# Patient Record
Sex: Female | Born: 1940 | Race: White | Hispanic: No | Smoking: Former smoker
Health system: Southern US, Community
[De-identification: ages and names within clinical notes are randomized; demographics above are authoritative.]

## PROBLEM LIST (undated history)

## (undated) DIAGNOSIS — M179 Osteoarthritis of knee, unspecified: Secondary | ICD-10-CM

## (undated) DIAGNOSIS — I472 Ventricular tachycardia, unspecified: Secondary | ICD-10-CM

## (undated) DIAGNOSIS — E119 Type 2 diabetes mellitus without complications: Secondary | ICD-10-CM

## (undated) DIAGNOSIS — I639 Cerebral infarction, unspecified: Secondary | ICD-10-CM

## (undated) DIAGNOSIS — F0390 Unspecified dementia without behavioral disturbance: Secondary | ICD-10-CM

## (undated) DIAGNOSIS — I509 Heart failure, unspecified: Secondary | ICD-10-CM

## (undated) DIAGNOSIS — E538 Deficiency of other specified B group vitamins: Secondary | ICD-10-CM

## (undated) DIAGNOSIS — R471 Dysarthria and anarthria: Secondary | ICD-10-CM

## (undated) DIAGNOSIS — M1710 Unilateral primary osteoarthritis, unspecified knee: Secondary | ICD-10-CM

## (undated) DIAGNOSIS — E669 Obesity, unspecified: Secondary | ICD-10-CM

## (undated) HISTORY — DX: Heart failure, unspecified: I50.9

## (undated) HISTORY — DX: Ventricular tachycardia, unspecified: I47.20

## (undated) HISTORY — DX: Type 2 diabetes mellitus without complications: E11.9

## (undated) HISTORY — DX: Dysarthria and anarthria: R47.1

## (undated) HISTORY — DX: Cerebral infarction, unspecified: I63.9

## (undated) HISTORY — DX: Obesity, unspecified: E66.9

## (undated) HISTORY — DX: Ventricular tachycardia: I47.2

---

## 2002-06-10 ENCOUNTER — Encounter: Payer: Self-pay | Admitting: *Deleted

## 2002-06-10 ENCOUNTER — Inpatient Hospital Stay (HOSPITAL_COMMUNITY): Admission: EM | Admit: 2002-06-10 | Discharge: 2002-06-17 | Payer: Self-pay | Admitting: *Deleted

## 2002-06-14 ENCOUNTER — Encounter: Payer: Self-pay | Admitting: *Deleted

## 2004-10-02 ENCOUNTER — Ambulatory Visit: Payer: Self-pay | Admitting: Internal Medicine

## 2006-02-23 ENCOUNTER — Ambulatory Visit: Payer: Self-pay | Admitting: Family Medicine

## 2006-05-29 ENCOUNTER — Ambulatory Visit: Payer: Self-pay | Admitting: Internal Medicine

## 2006-07-14 ENCOUNTER — Ambulatory Visit: Payer: Self-pay | Admitting: Gastroenterology

## 2007-06-23 ENCOUNTER — Ambulatory Visit: Payer: Self-pay | Admitting: Internal Medicine

## 2008-07-04 ENCOUNTER — Ambulatory Visit: Payer: Self-pay | Admitting: Internal Medicine

## 2008-07-17 ENCOUNTER — Ambulatory Visit: Payer: Self-pay | Admitting: Internal Medicine

## 2008-07-17 ENCOUNTER — Ambulatory Visit: Payer: Self-pay

## 2008-09-01 HISTORY — PX: BREAST BIOPSY: SHX20

## 2008-10-04 ENCOUNTER — Ambulatory Visit: Payer: Self-pay | Admitting: Family Medicine

## 2008-10-17 ENCOUNTER — Ambulatory Visit: Payer: Self-pay | Admitting: Family Medicine

## 2009-05-29 ENCOUNTER — Ambulatory Visit: Payer: Self-pay | Admitting: General Surgery

## 2009-09-05 DIAGNOSIS — I4729 Other ventricular tachycardia: Secondary | ICD-10-CM

## 2009-09-05 DIAGNOSIS — I472 Ventricular tachycardia, unspecified: Secondary | ICD-10-CM

## 2009-09-05 DIAGNOSIS — E669 Obesity, unspecified: Secondary | ICD-10-CM | POA: Insufficient documentation

## 2009-09-05 HISTORY — DX: Other ventricular tachycardia: I47.29

## 2009-09-05 HISTORY — DX: Ventricular tachycardia, unspecified: I47.20

## 2009-09-05 HISTORY — DX: Ventricular tachycardia: I47.2

## 2009-09-06 ENCOUNTER — Ambulatory Visit: Payer: Self-pay | Admitting: Internal Medicine

## 2009-10-05 ENCOUNTER — Ambulatory Visit: Payer: Self-pay | Admitting: Internal Medicine

## 2010-04-01 ENCOUNTER — Ambulatory Visit: Payer: Self-pay | Admitting: Internal Medicine

## 2010-10-03 NOTE — Letter (Signed)
Summary: Generic Letter  Architectural technologist, Main Office  [address]   [place], [name] [address]   Phone: [phone]  Fax: [phone]        April 01, 2010 MRN: [id number]    Yvonne Newton [address] [place], [name]  [address]    To Whom It May Concern,   [name] is okay to exercise per Dr Ladona Ridgel.  With further questions call our office at [phone].    Sincerely,    Dr Dorathy Daft, Ovidio Hanger, RN, BSN  This letter has been electronically signed by your physician.

## 2010-10-03 NOTE — Assessment & Plan Note (Signed)
Summary: F2Y  Medications Added METOPROLOL SUCCINATE 50 MG XR24H-TAB (METOPROLOL SUCCINATE) Take 2 tablets twice daily METFORMIN HCL 500 MG TABS (METFORMIN HCL) Take one tablet by mouth once daily. GLIPIZIDE 2.5 MG XR24H-TAB (GLIPIZIDE) Take one tablet by mouth once daily. TRAMADOL HCL 50 MG TABS (TRAMADOL HCL) as needed FLECAINIDE ACETATE 50 MG TABS (FLECAINIDE ACETATE) one by mouth two times a day      Allergies Added: ! SULFA ! * NICKEL  Visit Type:  Follow-up Primary Provider:  Burnett Sheng, MD   History of Present Illness: Ms. Marcy returns today after a year followup.  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  Over the last year, her episodes of palpitations, shortness of breath, and chest pressure have gradually worsened.  She notes that when she does anything strenuous, she will feel palpitations and she will get pressure in her chest.  This all gets better with rest.  The episodes have become more severe and lasting longer.  She has had no frank syncope.  She had no other specific complaints today.  She denies non-compliance with her medical therapy.  Current Medications (verified): 1)  Metoprolol Succinate 50 Mg Xr24h-Tab (Metoprolol Succinate) .... Take 3 Tablets By Mouth Daily 2)  Metformin Hcl 500 Mg Tabs (Metformin Hcl) .... Take One Tablet By Mouth Once Daily. 3)  Glipizide 2.5 Mg Xr24h-Tab (Glipizide) .... Take One Tablet By Mouth Once Daily. 4)  Verapamil Hcl Cr 180 Mg Xr24h-Cap (Verapamil Hcl) .Marland Kitchen.. 1 Capsule By Mouth Once Daily 5)  Tramadol Hcl 50 Mg Tabs (Tramadol Hcl) .... As Needed  Allergies (verified): 1)  ! Sulfa 2)  ! * Nickel  Past History:  Past Medical History: Last updated: 09/05/2009 Current Problems:  OBESITY (ICD-278.00) DM (ICD-250.00) VENTRICULAR TACHYCARDIA (ICD-427.1)    Review of Systems       The patient complains of chest pain and dyspnea  on exertion.  The patient denies syncope and peripheral edema.    Vital Signs:  Patient profile:   70 year old female Height:      60 inches Weight:      195 pounds BMI:     38.22 Pulse rate:   64 / minute BP sitting:   132 / 80  (left arm)  Vitals Entered By: Laurance Flatten CMA (September 06, 2009 9:10 AM)  Physical Exam  General:  Well developed, well nourished middle aged woman, in no acute distress.  HEENT: normal Neck: supple. No JVD. Carotids 2+ bilaterally no bruits Cor: RRR no rubs, gallops or murmur Lungs: CTA Ab: soft, nontender. nondistended. No HSM. Good bowel sounds Ext: warm. no cyanosis, clubbing or edema Neuro: alert and oriented. Grossly nonfocal. affect pleasant    EKG  Procedure date:  09/06/2009  Findings:      Normal sinus rhythm with rate of:  64.  Impression & Recommendations:  Problem # 1:  VENTRICULAR TACHYCARDIA (ICD-427.1) I have discussed the treatment options with the patient.  I am concerned that attempted ablation will be made more difficult by her likely like of VT at rest and with sedation.  For this reason and because she appears to be breaking thru on a combination of beta and calcium blockers, I have asked her to stop the Verapamil and start flecainide and to increase her metoprolol to 100 mg twice daily. Her updated medication list for this problem includes:    Metoprolol Succinate 50 Mg Xr24h-tab (  Metoprolol succinate) .Marland Kitchen... Take 2 tablets twice daily    Flecainide Acetate 50 Mg Tabs (Flecainide acetate) ..... One by mouth two times a day  Orders: EKG w/ Interpretation (93000)  Problem # 2:  OBESITY (ICD-278.00) A low fat diet is requested.  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 Her updated medication list for this problem includes:    Metoprolol Succinate 50 Mg Xr24h-tab (Metoprolol succinate) .Marland Kitchen... Take 2 tablets twice daily  Patient  Instructions: 1)  Your physician recommends that you schedule a follow-up appointment in: 3-4 weeks with Dr Ladona Ridgel 2)  Your physician has recommended you make the following change in your medication: stop Verapamil, increase Metoprolol 50mg  two times a day, start Flecainide 50mg  two times a day 3)  No strenuous activity Prescriptions: FLECAINIDE ACETATE 50 MG TABS (FLECAINIDE ACETATE) one by mouth two times a day  #60 x 6   Entered by:   Dennis Bast, RN, BSN   Authorized by:   Laren Boom, MD, [facility]   Signed by:   Dennis Bast, RN, BSN on 09/06/2009   Method used:   [facility]        [facility]  [address]       [address]       [place], [name]  [address]       Ph: [phone] or [phone]       Fax: [phone]   RxID:   [id number]

## 2010-10-03 NOTE — Assessment & Plan Note (Signed)
Summary: 6 MO F/U  Medications Added METOPROLOL SUCCINATE 100 MG XR24H-TAB (METOPROLOL SUCCINATE) one q am and 1/2 q pm for 2 months then 50mg  two times a day(1/2 two times a day0        Visit Type:  Follow-up Primary Provider:  Burnett Sheng, MD   History of Present Illness: Yvonne Newton returns today for followup.  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  Over the last year, her episodes of palpitations, shortness of breath, and chest pressure have gradually worsened.  When I saw her 6 months ago, I started flecainide and stopped verapamil and she has done well.  She has had no palpitations.  She has had no frank syncope.  She had no other specific complaints today.  She denies non-compliance with her medical therapy. She does admit to indiscretion with sodium. She notes some fatigue on her beta blocker and wonders if it may be decreased.  Current Medications (verified): 1)  Metoprolol Succinate 100 Mg Xr24h-Tab (Metoprolol Succinate) .... Two Times A Day 2)  Metformin Hcl 1000 Mg Tabs (Metformin Hcl) .... Take One Tablet Once Daily 3)  Glipizide 2.5 Mg Xr24h-Tab (Glipizide) .... Take One Tablet By Mouth Once Daily. 4)  Tramadol Hcl 50 Mg Tabs (Tramadol Hcl) .... As Needed 5)  Flecainide Acetate 50 Mg Tabs (Flecainide Acetate) .... One By Mouth Two Times A Day  Allergies: 1)  ! Sulfa 2)  ! * Nickel  Past History:  Past Medical History: Last updated: 2009-09-12 Current Problems:  OBESITY (ICD-278.00) DM (ICD-250.00) VENTRICULAR TACHYCARDIA (ICD-427.1)    Family History: Last updated: Sep 12, 2009  Her father died at the age of 58 of a myocardial  infarction. One brother had myocardial infarction at age 36. Another brother  also had an myocardial infarction and underwent coronary artery bypass graft  surgery.  Social History: Last updated: 09/12/09 Tobacco Use - Former.    Review of Systems  The patient denies chest pain, syncope, dyspnea on exertion, and peripheral edema.    Vital Signs:  Patient profile:   70 year old female Height:      60 inches Weight:      192 pounds BMI:     37.63 Pulse rate:   63 / minute BP sitting:   162 / 82  (left arm)  Vitals Entered By: Yvonne Newton CMA (April 01, 2010 3:31 PM)  Physical Exam  General:  Well developed, well nourished middle aged woman, in no acute distress.  HEENT: normal Neck: supple. No JVD. Carotids 2+ bilaterally no bruits Cor: RRR no rubs, gallops or murmur Lungs: CTA Ab: soft, nontender. nondistended. No HSM. Good bowel sounds Ext: warm. no cyanosis, clubbing or edema Neuro: alert and oriented. Grossly nonfocal. affect pleasant    EKG  Procedure date:  04/01/2010  Findings:      Normal sinus rhythm with rate of:63.    Impression & Recommendations:  Problem # 1:  VENTRICULAR TACHYCARDIA (ICD-427.1) Her symptoms have been very nicely controlled on high dose metoprolol and low dose flecainide.  Will ask her to reduce her metoprolol as below and continue her flecainide.  She will call if her symptoms worsen. Her updated medication list for this problem includes:    Metoprolol Succinate 100 Mg Xr24h-tab (Metoprolol succinate) ..... One q am and 1/2 q pm for 2 months then 50mg  two times a day(1/2 two times a day0    Flecainide Acetate  50 Mg Tabs (Flecainide acetate) ..... One by mouth two times a day  Problem # 2:  ESSENTIAL HYPERTENSION, BENIGN (ICD-401.1) Her blood pressure is elevated.  I will ask her to reduce her sodium intake and will follow up.  She may require additional medication now that we are reducing her beta blocker. Her updated medication list for this problem includes:    Metoprolol Succinate 100 Mg Xr24h-tab (Metoprolol succinate) ..... One q am and 1/2 q pm for 2 months then 50mg  two times a day(1/2 two times a day0  Patient Instructions: 1)  Your physician  recommends that you schedule a follow-up appointment in: 6 months with Dr Ladona Ridgel 2)  Your physician has recommended you make the following change in your medication: decrease Metoprolol to 100mg  in the am and 50mg  in the pm for 2 months then decrease to 50mg  two times a day

## 2010-10-03 NOTE — Assessment & Plan Note (Signed)
Summary: PER CHECK OUT/SF  Medications Added METOPROLOL SUCCINATE 100 MG XR24H-TAB (METOPROLOL SUCCINATE) two times a day METFORMIN HCL 1000 MG TABS (METFORMIN HCL) take one tablet once daily      Allergies Added:   Visit Type:  Follow-up Primary Provider:  Burnett Sheng, MD  CC:  follow up.  History of Present Illness: Yvonne Newton returns today after a year followup.  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  Over the last year, her episodes of palpitations, shortness of breath, and chest pressure have gradually worsened.  When I saw her a month ago, I started flecainide and stopped verapamil and she has done well.  She has had no palpitations.  She has had no frank syncope.  She had no other specific complaints today.  She denies non-compliance with her medical therapy. She does admit to indiscretion with sodium.  Current Medications (verified): 1)  Metoprolol Tartrate 50 Mg Tabs (Metoprolol Tartrate) .... Take 2 Tablets in The Morning and 2 Tablets in The Evening 2)  Metformin Hcl 1000 Mg Tabs (Metformin Hcl) .... Take One Tablet Once Daily 3)  Glipizide 2.5 Mg Xr24h-Tab (Glipizide) .... Take One Tablet By Mouth Once Daily. 4)  Tramadol Hcl 50 Mg Tabs (Tramadol Hcl) .... As Needed 5)  Flecainide Acetate 50 Mg Tabs (Flecainide Acetate) .... One By Mouth Two Times A Day  Allergies (verified): 1)  ! Sulfa 2)  ! * Nickel  Past History:  Past Medical History: Last updated: 09/05/2009 Current Problems:  OBESITY (ICD-278.00) DM (ICD-250.00) VENTRICULAR TACHYCARDIA (ICD-427.1)    Review of Systems  The patient denies chest pain, syncope, dyspnea on exertion, and peripheral edema.    Vital Signs:  Patient profile:   70 year old female Height:      60 inches Weight:      197 pounds BMI:     38.61 Pulse rate:   55 / minute Pulse rhythm:   regular BP sitting:   146 / 70  (left  arm) Cuff size:   large  Vitals Entered By: Judithe Modest CMA (October 05, 2009 8:42 AM)  Physical Exam  General:  Well developed, well nourished middle aged woman, in no acute distress.  HEENT: normal Neck: supple. No JVD. Carotids 2+ bilaterally no bruits Cor: RRR no rubs, gallops or murmur Lungs: CTA Ab: soft, nontender. nondistended. No HSM. Good bowel sounds Ext: warm. no cyanosis, clubbing or edema Neuro: alert and oriented. Grossly nonfocal. affect pleasant    EKG  Procedure date:  10/05/2009  Findings:      Sinus bradycardia with rate of:  55. ILBBB.  Impression & Recommendations:  Problem # 1:  VENTRICULAR TACHYCARDIA (ICD-427.1) Her RVOT VT is well controlled on her current medical therapy.  Today I will switch her to lopressor and continue flecanide. Her updated medication list for this problem includes:    Metoprolol Succinate 100 Mg Xr24h-tab (Metoprolol succinate) .Marland Kitchen..Marland Kitchen Two times a day    Flecainide Acetate 50 Mg Tabs (Flecainide acetate) ..... One by mouth two times a day  Problem # 2:  ESSENTIAL HYPERTENSION, BENIGN (ICD-401.1) Continue low sodium diet and current medical therapy. I have recommended low intensity exercise. Her updated medication list for this problem includes:    Metoprolol Succinate 100 Mg Xr24h-tab (Metoprolol succinate) .Marland Kitchen..Marland Kitchen Two times a day  Patient Instructions: 1)  Your physician has recommended you make the following change in your medication: STOP THE  METOPROLOL TARTRATE. 2)  START METOPROLOL SUCCINATE AT 100MG  two times a day  3)  Your physician recommends that you schedule a follow-up appointment in: 6 MONTHS Prescriptions: METOPROLOL SUCCINATE 100 MG XR24H-TAB (METOPROLOL SUCCINATE) two times a day  #60 x 6   Entered by:   Duncan Dull, RN, BSN   Authorized by:   Laren Boom, MD, [facility]   Signed by:   Duncan Dull, RN, BSN on 10/05/2009   Method used:   [facility]        [facility]  [address]  [address] ([facility]       [address]       [place], [name]  [address]       Ph: [phone] or [phone]       Fax: [phone]   RxID:   [id number]

## 2010-10-30 ENCOUNTER — Encounter: Payer: Self-pay | Admitting: Internal Medicine

## 2010-10-30 ENCOUNTER — Ambulatory Visit (INDEPENDENT_AMBULATORY_CARE_PROVIDER_SITE_OTHER): Payer: Medicare Other | Admitting: Internal Medicine

## 2010-10-30 DIAGNOSIS — I10 Essential (primary) hypertension: Secondary | ICD-10-CM

## 2010-10-30 DIAGNOSIS — I472 Ventricular tachycardia: Secondary | ICD-10-CM

## 2010-11-07 NOTE — Assessment & Plan Note (Signed)
Summary: follow up/klper check out/sf  Medications Added METOPROLOL SUCCINATE 100 MG XR24H-TAB (METOPROLOL SUCCINATE) one q am and 1/2 q pm for 2 months then 50mg  two times a day(1/2 two times a day0 METOPROLOL TARTRATE 50 MG TABS (METOPROLOL TARTRATE) 1 two times a day * VIT D2 M 50,000 U 1 once daily        Visit Type:  Follow-up Primary Provider:  Burnett Sheng, MD   History of Present Illness: Yvonne Newton returns today for followup.  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  Over the last year, her episodes of palpitations, shortness of breath, and chest pressure had gradually worsened.  When I saw her 12 months ago, I started flecainide and stopped verapamil and she has done well.  She has had no palpitations.  She has had no frank syncope.  She had no other specific complaints today.  She denies non-compliance with her medical therapy. She does admit to indiscretion with sodium. She notes some fatigue on her beta blocker and wonders if it may be decreased.  Current Medications (verified): 1)  Metoprolol Succinate 100 Mg Xr24h-Tab (Metoprolol Succinate) .... One Q Am and 1/2 Q Pm For 2 Months Then 50mg  Two Times A Day(1/2 Two Times A Day0 2)  Metformin Hcl 1000 Mg Tabs (Metformin Hcl) .... Take One Tablet Once Daily 3)  Glipizide 2.5 Mg Xr24h-Tab (Glipizide) .... Take One Tablet By Mouth Once Daily. 4)  Tramadol Hcl 50 Mg Tabs (Tramadol Hcl) .... As Needed 5)  Flecainide Acetate 50 Mg Tabs (Flecainide Acetate) .... One By Mouth Two Times A Day 6)  Vit D2 M 50,000 U .... 1 Once Daily  Allergies: 1)  ! Sulfa 2)  ! * Nickel  Past History:  Past Medical History: Last updated: 09/05/2009 Current Problems:  OBESITY (ICD-278.00) DM (ICD-250.00) VENTRICULAR TACHYCARDIA (ICD-427.1)    Vital Signs:  Patient profile:   70 year old female Height:      60 inches Weight:      195  pounds BMI:     38.22 Pulse rate:   73 / minute BP sitting:   132 / 68  (left arm)  Vitals Entered By: Laurance Flatten CMA (October 30, 2010 10:19 AM)  Physical Exam  General:  Well developed, well nourished middle aged woman, in no acute distress.  HEENT: normal Neck: supple. No JVD. Carotids 2+ bilaterally no bruits Cor: RRR no rubs, gallops or murmur Lungs: CTA Ab: soft, nontender. nondistended. No HSM. Good bowel sounds Ext: warm. no cyanosis, clubbing or edema Neuro: alert and oriented. Grossly nonfocal. affect pleasant    EKG  Procedure date:  10/30/2010  Findings:      Normal sinus rhythm with rate of:  73.  Impression & Recommendations:  Problem # 1:  VENTRICULAR TACHYCARDIA (ICD-427.1) Her symptoms are much improved. She will continue her current meds but I have asked that she reduce her metoprolol. Her updated medication list for this problem includes:    Metoprolol Tartrate 50 Mg Tabs (Metoprolol tartrate) .Marland Kitchen... 1 two times a day    Flecainide Acetate 50 Mg Tabs (Flecainide acetate) ..... One by mouth two times a day  Orders: EKG w/ Interpretation (93000)  Problem # 2:  ESSENTIAL HYPERTENSION, BENIGN (ICD-401.1) Her blood pressure is well controlled today. I have asked her to reduce her sodium intake. Her updated medication list for this problem includes:    Metoprolol Tartrate  50 Mg Tabs (Metoprolol tartrate) .Marland Kitchen... 1 two times a day  Patient Instructions: 1)  Your physician wants you to follow-up in:6 MONTHS WITH DR Court Joy will receive a reminder letter in the mail two months in advance. If you don't receive a letter, please call our office to schedule the follow-up appointment. 2)  Your physician has recommended you make the following change in your medication: METOPROLOL TART 50 MG 1/2 TAB once daily Prescriptions: METOPROLOL TARTRATE 50 MG TABS (METOPROLOL TARTRATE) 1 two times a day  #60 x 6   Entered by:   Scherrie Bateman, LPN   Authorized by:    Laren Boom, MD, [facility]   Signed by:   Scherrie Bateman, LPN on 14/78/2956   Method used:   [facility]        [facility]  [address]       [address]       [place], [name]  [address]       Ph: [phone] or [phone]       Fax: [phone]   RxID:   [id number]

## 2010-11-11 ENCOUNTER — Telehealth: Payer: Self-pay | Admitting: Internal Medicine

## 2010-11-19 NOTE — Progress Notes (Signed)
Summary: c/o heart racing/lm   Phone Note Call from Patient Call back at Home Phone [phone]   Refills Requested: Medication #1:   1 two times a day Caller: Patient Reason for Call: Talk to Nurse Summary of Call: c/o heart racing pt went back on her original meds of METOPROLOL TARTRATE 50 MG TABS. vs 25 mg in am 25 mg in pm.  Initial call taken by: Lorne Skeens,  November 11, 2010 9:35 AM  Follow-up for Phone Call        called pt back and spoke with husband.  She was not at home.  i asked him to have her call me back. Dennis Bast, RN, BSN  November 11, 2010 1:13 PM pt increased her Metoprolol back to 50mg  two times a day on her own due to palpitations  She feels better now Dennis Bast, RN, BSN  November 12, 2010 1:23 PM

## 2010-12-08 ENCOUNTER — Other Ambulatory Visit: Payer: Self-pay | Admitting: Internal Medicine

## 2011-01-14 NOTE — Procedures (Signed)
[facility]                              EXERCISE TREADMILL   NAME:Munnerlyn, [name]                   MRN:          [id number]  DATE:07/17/2008                            DOB:          09-Apr-1941    The treadmill test is being performed today to evaluate for exercise-  induced arrhythmias in a patient with a prior history of RV outflow  tract ventricular tachycardia.   I. INTRODUCTION:  The patient is a very pleasant, middle-aged woman, who  has a documented history of RV outflow tract VT in the past.  She had  previously been stable on medical therapy.  However, over the last  several months, she has had increasingly frequent episodes of tachy-  palpitations typically occurring with exercise.  These are associated  with dizziness and lightheadedness.  She has not had any frank syncope.  She is here today for exercise treadmill test to see about the  inducibility of her RV outflow tract VT.  Of note, the patient did not  take her Toprol today on my request.   II. INTERPRETATION OF PROCEDURE:  After informed consent obtained, the  patient was prepped in the usual manner, she was placed on the exercise  treadmill where she walked utilizing the modified Bruce protocol.  The  patient completed stage 2 on the Bruce protocol walking for a total of 7  minutes.  Her peak heart rate increased to 112 beats per minute.  Her  blood pressure response was hypertensive, initially starting in the 160s  and going up to over 200 mm systolic.  The test was stopped because the  patient became dizzy and lightheaded.  She denied chest pain, she did  not have shortness of breath, she did not have much in the way of  palpitations.  She was allowed to recover in the usual manner.  Her  blood pressure did remain elevated, however.  Her heart rate came down  into the 80 range.  The test was clinically negative, the test was  electrically negative, there were no diagnostic  STT depression for  diagnostic of ischemia.   III. COMPLICATIONS:  There were no immediate procedure complications.   IV. RESULTS:  This exercise treadmill test demonstrates no inducible  ischemia and demonstrates no exercise induced arrhythmias.  There was a  hypertensive response to exercise.  The patient is very clearly  deconditioned.     Doylene Canning. Ladona Ridgel, MD  Electronically Signed    GWT/MedQ  DD: 07/17/2008  DT: 07/17/2008  Job #: [id number]   cc:   Jerl Mina

## 2011-01-14 NOTE — Assessment & Plan Note (Signed)
[facility]                         ELECTROPHYSIOLOGY OFFICE NOTE   NAME:Yvonne Newton                   MRN:          [id number]  DATE:07/04/2008                            DOB:          October 03, 1940    Yvonne Newton returns today after a year followup.  She is a very  pleasant 70 year old woman with a nonsustained VT originating from the  RV outflow tract, hypertension, diabetes, and obesity.  Over the last  year, her episodes of palpitations, shortness of breath, and chest  pressure have gradually worsened, particularly in the last several  weeks.  She notes that when she does anything strenuous, she will feel  palpitations and she will get pressure in her chest.  This all gets  better with rest.  The episodes have become more severe and lasting  longer.  She has had no frank syncope.  She had no other specific  complaints today.  She states that her diabetes has been fairly well  controlled with hemoglobin A1c's under 7.   MEDICATIONS:  1. Metoprolol 150 a day.  2. Glipizide ER 2.5 a day.  3. Metformin 500 twice a day.  4. Amlodipine 5 a day.   PHYSICAL EXAMINATION:  GENERAL:  She is a pleasant middle-aged woman in  no acute distress.  VITAL SIGNS:  Her blood pressure was 128/70, the pulse 68 and regular,  respirations were 18, and the weight was 195 pounds. NECK:  No jugular  venous distention.  LUNGS:  Clear bilaterally to auscultation.  No wheezes, rales, or  rhonchi are present.  CARDIOVASCULAR:  Regular rate and rhythm.  Normal S1 and S2.  There are  no murmurs, rubs, or gallops.  ABDOMEN:  Soft, nontender, and nondistended.  EXTREMITIES:  Trace peripheral edema bilaterally.   Her EKG demonstrates sinus rhythm with normal axis and intervals.   IMPRESSION:  1. Worsening chest pain, palpitations, and shortness of breath.  2. 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  3.  Diabetes.  4. Obesity.   DISCUSSION:  Yvonne Newton symptoms have worsened in the last year,  particularly in the last several weeks.  I am concerned that she is  having more VT, but because of her coronary artery disease risk factor,  this all may be angina.  I have recommended we first start out by  undergoing exercise treadmill test, as the patient does have a fairly  normal EKG to see if we can have any exercise-induced arrhythmias.  Pending the results of this, we will make additional recommendations.  At this point, because she has had symptomatic palpitations despite beta-  blocker therapy which appear to be worse, I would strongly consider an  EP study and  consideration for catheter ablation if, in fact, she has exercise-  induced VT at exercise treadmill testing.  We will also plan to obtain a  flu vaccine today, as she has not had one yet this year.     Doylene Canning. Ladona Ridgel, MD  Electronically Signed    GWT/MedQ  DD: 07/04/2008  DT: 07/04/2008  Job #: [id number]  cc:   Jerl Mina

## 2011-01-14 NOTE — Assessment & Plan Note (Signed)
[facility]                         ELECTROPHYSIOLOGY OFFICE NOTE   NAME:Schueler, Sandra Cockayne                   MRN:          [id number]  DATE:06/23/2007                            DOB:          [birth year]    Ms. Dullea returns today for followup.  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  She returns today  for followup.  In the last year, she has been stable.  She notes that  when she does anything strenuous, particularly with her arms, she will  have discomfort in the chest and palpitations, but otherwise she has  been very stable.  She has had no syncope.  She is tolerating her Toprol  very nicely.  She has lost a few pounds in the last year, going from 197  to 193.   PHYSICAL EXAMINATION:  She is a pleasant middle-age woman in no acute  distress.  Blood pressure was 142/70, the pulse was 66 and regular, the  respirations were 18, the weight was 193 pounds.  NECK:  Revealed no jugulovenous distention.  LUNGS:  Clear bilaterally to auscultation.  No wheezes, rales or rhonchi  were present.  CARDIOVASCULAR:  Exam revealed a regular rate and rhythm with normal S1  and S2.  There were no murmurs, rubs, or gallops.  EXTREMITIES:  Demonstrated no cyanosis, clubbing or edema.   EKG:  Demonstrates sinus rhythm with poor R-wave progression.   IMPRESSION:  1. Ventricular tachycardia (nonsustained, presenting from the right      ventricular outflow tract -- controlled with beta blockers.  2. Hypertension; this has improved with beta blockers.  3. Obesity.  I have discussed today the importance of reducing her      food intake and strategies that might help with that.  4. Diabetes.  Her blood sugars have been well-controlled.  Her      hemoglobin A1c was in the 6 range.   PLAN:  Plan will be to see the patient back in 2 years, as she has  done  so well in the last year or 2 and she is instructed to call us if her  palpitations worsen.     Doylene Canning. Ladona Ridgel, MD  Electronically Signed    GWT/MedQ  DD: 06/23/2007  DT: 06/24/2007  Job #: [id number]   cc:   Carrington Clamp MD

## 2011-01-17 NOTE — Op Note (Signed)
NAME:  Yvonne Newton, Yvonne Newton NO.:  [id number]   MEDICAL RECORD NO.:  [id number]                   PATIENT TYPE:  INP   LOCATION:  3711                                 FACILITY:  [facility]   PHYSICIAN:  Doylene Canning. Ladona Ridgel, M.D. [facility]           DATE OF BIRTH:  1940/10/06   DATE OF PROCEDURE:  06/16/2002  DATE OF DISCHARGE:                                 OPERATIVE REPORT   PROCEDURE:  Invasive electrophysiologic study.   INDICATIONS:  Sustained ventricular tachycardia.   INTRODUCTION:  The patient is a very pleasant 70 year old woman with a  history of hypertension.  She had tachypalpitations and underwent exercise  treadmill testing, resulting in the initiation of a left bundle branch  block, superior axis ventricular tachycardia.  This was associated with  dizziness but no syncope.  The patient's tachycardia terminated with rest.  The cycle length was 400 msec.  She was subsequently transferred from the  [facility] for additional evaluation.  She was noted to have  preserved LV systolic function.  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  However, a cardiac MRI scan was done demonstrating no evidence  of any fatty fibroinfiltration of the myocardium.  She is now referred for  invasive electrophysiologic study.   DESCRIPTION OF PROCEDURE:  After informed consent was obtained, the patient  was taken to the diagnostic EP lab in a fasted state.  After the usual  preparation and draping, intravenous fentanyl and midazolam were given for  sedation.  A 5 French quadripolar catheter was inserted percutaneously in  the right femoral vein and advanced to the right ventricular apex.  A 5  French quadripolar catheter was inserted percutaneously in the right femoral  vein and advanced to the His bundle region.  Programmed ventricular  stimulation was subsequently carried out from  the RV apex as well as the RV  outflow tract at basic drive cycle lengths of 629 and 400 msec.  S1-S2, S1-  S2-S3, and S1-S2-S3-S4 stimuli were delivered with the S1-S2, S2-S3, and S3-  S4 intervals stepwise decreased down to ventricular refractoriness.  During  programmed ventricular stimulation there was no inducible VT.  Rapid  ventricular pacing was then carried out from the RV apex, demonstrating a VA  Wenckebach cycle length of 550 msec.  Rapid atrial pacing was then carried  out, demonstrating an AV Wenckebach cycle length of 350 msec.  During rapid  atrial pacing the PR interval remained less than the RR interval, and there  was no inducible tachycardia.  Programmed atrial stimulation was then  carried out from the high right atrium at a basic drive cycle length of 528  msec.  The S1-S2 interval was stepwise decreased down to 200 msec, where AV  conduction persisted.  At this point the catheters were then removed,  hemostasis was assured, and the patient returned to  her room in satisfactory  condition.   COMPLICATIONS:  There were no immediate procedural complications.   RESULTS:  A.  BASELINE ELECTROCARDIOGRAM:  The baseline ECG demonstrates  normal sinus rhythm  with normal axis and intervals.   B.  BASELINE INTERVALS:  The sinus node cycle length was 993 msec.  The QRS  duration was 86 msec.  The HV interval was 40 msec.   C.  RAPID VENTRICULAR PACING:  Rapid ventricular pacing was carried out from  the RV apex at  pacing cycle length of 600 msec and stepwise decreased down  to 550 msec, where VA Wenckebach was observed.  During rapid ventricular  pacing, the pacing interval was stepwise decreased down to 260 msec, and  there was no inducible VT.   D.  PROGRAMMED VENTRICULAR STIMULATION:  Programmed ventricular stimulation  was carried out from the RV outflow tract as well as the RV apex at pacing  cycle lengths of 500 and 400 msec.  S1-S2, S1-S2-S3, and S1-S2-S3-S4  stimuli  were delivered with the S1-S2, S2-S3, and S3-S4 intervals stepwise decreased  down to ventricular refractoriness.  During programmed ventricular  stimulation there was no inducible VT.   CONCLUSION:  This study demonstrates no evidence of inducible VT or SVT in a  patient with previously exercise-induced VT.  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  As such, she will be treated with beta blocker  therapy and follow up.  If she has recurrent tachypalpitations or is unable  to tolerate beta blockers, then consideration will be made for catheter  ablation of her tachycardia.                                                Doylene Canning. Ladona Ridgel, M.D. [facility]    GWT/MEDQ  D:  06/16/2002  T:  06/17/2002  Job:  [id number]   cc:   Nicholes Rough, [name] Arnoldo Hooker, M.D.   [place], [name] Dr. Burnett Sheng, [facility]   Kathrine Cords, [facility]

## 2011-01-17 NOTE — Consult Note (Signed)
NAME:  Yvonne Newton, Yvonne Newton NO.:  [id number]   MEDICAL RECORD NO.:  [id number]                   PATIENT TYPE:  INP   LOCATION:  3711                                 FACILITY:  [facility]   PHYSICIAN:  Doylene Canning. Ladona Ridgel, M.D. [facility]           DATE OF BIRTH:  03/25/1941   DATE OF CONSULTATION:  06/13/2002  DATE OF DISCHARGE:                                   CONSULTATION   INDICATIONS FOR CONSULTATION:  Evaluation of a wide QRS tachycardia.   HISTORY OF PRESENT ILLNESS:  The patient is a very pleasant 70 year old  woman with a new onset diabetes mellitus diagnosed just one to two weeks ago  and a history of hypertension, again, very recently diagnosed. She has a  history of tobacco abuse and in the past has been a 30 pack year smoker. She  has had tachy palpitations in the past and underwent catheterization several  years ago, and at that time had normal left ventricular function and no  obstructive coronary disease. She continued to have tachy palpitations and  typically  while at work, occurring only rarely, associated with dizziness  but no frank syncope. Because of these palpitations, she underwent exercise  treadmill testing, where approximately 1 and a half minutes into exercise,  the patient developed a wide QRS tachycardia. The patient was  dizzy and  light headed but denied frank syncope. The tachycardia was a left bundle  branch block tachycardia with strongly positive inferior axis leads,  suggesting tachycardia origination from the RV outflow tract. Interestingly  there was early transition from negative to positive  in the precordial  leads between V2 and leads V3. The tachycardia terminated spontaneously. She  underwent urgent catheterization, demonstrating preserved left ventricular  systolic function and no obstructive coronary disease and is subsequently  transferred for additional evaluation and treatment. The patient has been on  telemetry  monitoring now for several days and has had no arrhythmias noted.   PAST MEDICAL HISTORY:  As previously noted.  There is also a history of  hyperlipidemia.   FAMILY HISTORY:  Notable for her father dying at age 34 of an MI. She had  two brothers who have had coronary disease.   SOCIAL HISTORY:  The patient is married and denies ethanol abuse and smokes  cigarettes as previously noted.   REVIEW OF SYSTEMS:  Negative for fever or chills and night sweats. She  denies any nasal discharge or vision or hearing problems. She denies any  skin rashes or lesions. She denies any chest pain or shortness of breath.  She does have some dyspnea on exertion. She denies any orthopnea or PND. She  has rare peripheral edema. She denies syncope or claudication or cough. She  denies dysuria, hematuria or nocturia. She denies weakness, numbness or mood  problems. She denies depression or anxiety. She denies arthralgias, joint  swelling or deformity. She denies any nausea or vomiting.  She has had  episodes of diarrhea in the past. She denies melena, hematemesis or reflux  symptoms. She denies polyuria or polydipsia.   PHYSICAL EXAMINATION:  GENERAL:  She is a pleasant, well appearing middle  aged woman in no distress.  VITAL SIGNS:  Blood pressure 120/80, pulse 64 and regular, respirations 20,  temperature 97.3. Weight 180 pounds.  HEENT:  Normocephalic, atraumatic. Pupils were equal and round. Oropharynx  was moist. Sclerae anicteric. No nasal discharge.  NECK:  Supple with no jugular venous  distention. No thyromegaly. The  carotids were 2+ and symmetric.  CARDIOVASCULAR:  Revealed a regular rate and rhythm with normal S1, S2.  There were no murmurs, rubs, gallops.  LUNGS:  Clear to auscultation bilaterally.  ABDOMEN:  Soft, nontender, nondistended.  EXTREMITIES:  Demonstrated no cyanosis, clubbing or edema.  NEUROLOGIC:  She was alert and oriented x 3 with cranial nerves II through  XII grossly  intact. The strength was 5/5 and symmetric.   LABORATORY DATA:  An EKG demonstrates a normal sinus rhythm with poor R-wave  transition in the anterior leads. There was no  obvious epsilon wave noted.  There were nonspecific T-wave abnormality noted.   IMPRESSION:  1. Wide QRS tachycardia originating in or around the RV outflow tract, most     likely.  2. Hypertension.  3. Diabetes.  4. History of dyslipidemia.   DISCUSSION:  I have discussed the likely diagnosis with the patient. She  most likely has RV outflow tract VT. She could certainly have an  arrhythmogenic RV dysplasia with VT originating from the RV outflow tract  region. Our plan will be to have the patient undergo signal averaged EKG and  cardiac MRI. If there is evidence to suggest that in fact she does have  arrhythmogenic RV dysplasia, then electrophysiologic study and ICD insertion  will be warranted. If it appears that the patient does not have  arrhythmogenic RV dysplasia, then the possibility of catheter ablation  versus initial trial of medical therapy will be decided upon. The risks and  benefits, goals and expectations of all of the above procedures have been  discussed with the patient. We will plan to proceed based on additional  data. Of note the fact that the transition from V2 to V3 with negative to  positive suggests that this tachycardia may be near the septum, which is  sometimes even more difficult to ablate.                                               Doylene Canning. Ladona Ridgel, M.D. [facility]    GWT/MEDQ  D:  06/13/2002  T:  06/14/2002  Job:  [id number]   cc:   Arnoldo Hooker, M.D.  [place], [name]   James Hedrick   Kathrine Cords, R.N. [facility]  [address]  [place], [name] [address]  Fax: 1

## 2011-01-17 NOTE — Discharge Summary (Signed)
NAME:  Yvonne Newton, Yvonne Newton NO.:  [id number]   MEDICAL RECORD NO.:  [id number]                   PATIENT TYPE:  INP   LOCATION:  3711                                 FACILITY:  [facility]   PHYSICIAN:  Doylene Canning. Ladona Ridgel, M.D. [facility]           DATE OF BIRTH:  01-12-1941   DATE OF ADMISSION:  06/10/2002  DATE OF DISCHARGE:  06/17/2002                                 DISCHARGE SUMMARY   PRIMARY DIAGNOSIS:  Ventricular tachycardia.   HISTORY OF PRESENT ILLNESS:  This is a 70 year old female with new onset  diabetes diagnosed just one to two weeks ago.  History of hypertension,  recently diagnosed.  Tobacco abuse in the past, has been a 30 pack year  smoker, tachy palpitations in the past.  Underwent catheterization several  years ago.  At that time, had normal left ventricular function and no  obstructive disease.  She continued to have tachy palpitations typical while  at work occurring, rarely associated with dizziness, but no frank syncope.  Because of palpitations, she underwent an exercise treadmill where  approximately 1-1/2 minutes into the exercise she developed a wide QRS  tachycardia.  The patient was dizzy, lightheaded, but denied frank syncope.  The tachycardia was a left bundle branch block tachycardia with strong  positive inferior axis leads suggesting tachycardia originating from the RV  outflow track.  Interestingly, there was early transition from negative to  positive in the precordial leads between V2 and V3.  The tachycardia  terminated spontaneously.  She underwent urgent catheterization which  demonstrated preserved left ventricular function and no obstructive coronary  disease.  She was transferred to [facility] for evaluation and  treatment by electrophysiology.   HOSPITAL COURSE:  The patient had an EP consult.  She underwent a signal  average EKG as well as a cardiac MRI.  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  The  cardiac MRI was negative, suggesting RVOTVT.  The  patient was then scheduled for an EP study.  The patient's EP study was  negative for inducible ventricular tachycardia, and she was discharged to  home on Toprol XL 50 mg p.o. q.d.   LABORATORY DATA:  On 06/16/02, INR was 0.9.  On 06/14/02, white blood cell  count was 10.3, hemoglobin and hematocrit 13.7 and 40.3, with a platelet  count of 232.  On 06/14/02, sodium was 140, potassium 4.1, chloride 104, CO2  29, glucose 109.  The patient's fasting glucoses were as follows:  124, 128,  133, and 109.  BUN and creatinine were 15 and 0.8.  Hemoglobin A1C was 7.5.  Cholesterol 154, HDL 38, LDL 89, triglycerides 136.  TSH was 1.419.  The  patient was given a referral for diabetic teaching at [facility] diabetic program for dietary teaching.   DISCHARGE MEDICATIONS:  Toprol XL 50 mg q.d.   ACTIVITY:  No heavy lifting or  strenuous activity for the next four days.  No driving for two days.   DIET:  Low fat, low cholesterol, low salt diet.   She was to call if she developed a lump or any drainage in her groin.    FOLLOWUP:  The patient was to see Dr. Ladona Ridgel on 07/10/02, at 11:45 a.m.   The patient was allowed to return to work on Monday.       Chinita Pester, C.R.N.P. [facility]                 Doylene Canning. Ladona Ridgel, M.D. [facility]    DS/MEDQ  D:  06/17/2002  T:  06/19/2002  Job:  [id number]   cc:   Laveda Norman, M.D.  [place], [name]   Mickel Baas, M.D.  [facility] [place], [name]   Kathrine Cords, R.N. [facility]  [address]  [place], [name] [address]  Fax: 1

## 2011-01-17 NOTE — H&P (Signed)
NAME:  Yvonne Newton, Yvonne Newton NO.:  [id number]   MEDICAL RECORD NO.:  [id number]                   PATIENT TYPE:  INP   LOCATION:  3711                                 FACILITY:  [facility]   PHYSICIAN:  Veneda Melter, M.D. [facility]               DATE OF BIRTH:  10-26-1940   DATE OF ADMISSION:  06/10/2002  DATE OF DISCHARGE:                                HISTORY & PHYSICAL   CHIEF COMPLAINT:  Palpitations and presyncope.   HISTORY OF PRESENT ILLNESS:  The patient is a 70 year old white female with  no prior cardiac history who presents for assessment and treatment of  ventricular tachycardia. The patient has had a several week history of  palpitations with associated presyncope. These have lasted a few minutes and  then associated with mild substernal chest pressure and shortness of breath.  The patient has undergone cardiac catheterization approximately two years  ago which was reportedly normal. She presented to [facility] on  several occasions and was referred for further assessment. She underwent a  stress imaging study during which she had ventricular tachycardia with  extension. Cardiac catheterization was performed today showing mild coronary  artery disease and well preserved left ventricular function. She is  referred for further assessment and treatment.   REVIEW OF SYSTEMS:  Currently the patient denies any chest pain or shortness  of breath. She has not had any further palpitations. She notes episodes of  presyncope with palpitations but no frank syncope, no diaphoresis, nausea or  vomiting, bright red blood per rectum, melena, hematuria or dysuria. No  fever or chills or other constitutional symptoms. Review of systems is  otherwise noncontributory.   PAST MEDICAL HISTORY:  1. Notable for normal cardiac catheterization approximately two years ago. A     repeat catheterization earlier today is showing only mild coronary     disease with well  preserved  left ventricular function.  2. Status post cholecystectomy.  3. Tubal ligation.  4. Status post hysterectomy.   ALLERGIES:  SULFA.   MEDICATIONS:  Hyzaar 50/12.5 1 tablet q.d.   SOCIAL HISTORY:  The patient lives in [place]. She is married and has  four children. She has a 30 pack year history of tobacco. She quit for one  year. She denies alcohol use.   FAMILY HISTORY:  Her father died at the age of 92 of a myocardial  infarction. One brother had myocardial infarction at age 67. Another brother  also had an myocardial infarction and underwent coronary artery bypass graft  surgery.   PHYSICAL EXAMINATION:  GENERAL:  She was an overweight white female in no  acute distress. Weight is 180 pounds.  VITAL SIGNS:  Temperature 96.9, pulse 76, respirations 20, blood pressure  112/76, O2 saturation 93% on room air.  HEENT:  Pupils equally round and reactive to light. Extraocular movements  intact. Oropharynx shows no lesions. Dentition is poor.  NECK:  Supple, soft, right supraclavicular bruit.  HEART:  Regular rate without murmurs.  LUNGS:  Clear to auscultation.  ABDOMEN:  Soft, nontender.  EXTREMITIES:  Peripheral pulses 2+ and equal bilaterally. The right groin is  stable. No hematoma. A Perclose dressing is noted.  Motor strength is 5/5. Sensory is intact to touch.   LABORATORY DATA:  An EKG shows normal sinus rhythm at 69. Q-waves are noted  in leads V1 through V3 with nonspecific STT wave changes in the other leads.   White count 9.8, hemoglobin 13.9, hematocrit 39.6, platelets 215. Sodium  140, potassium 3.1, chloride 101, bicarbonate 26, BUN 13, creatinine 0.8,  glucose 138.   ASSESSMENT AND PLAN:  The patient is a 70 year old white female who presents  with a several week history of palpitations. 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 She has mild coronary artery disease  and well preserved  left ventricular function by  cardiac catheterization.  She has some mild hypokalemia by laboratory data. Certainly electrolyte  abnormalities may be an inciting or exacerbating factor of her irritable  foci may also be a possibility. The patient has recent onset of hypertension  which was fairly controlled with current medications and newly diagnosed  diabetes mellitus x 2 as well. Further treatment of this may be necessary  following discharge, as well as attention to dyslipidemia and probable  metabolic syndrome.   We will start the patient on low dose beta blocker and observe her on  telemetry, check thyroid function tests as well as follow her serum  chemistry following potassium replacement. I have discussed the case with  Dr. Ladona Ridgel, our electrophysiologic specialist, and the patient will be  scheduled for an  EP study and possible radiofrequency ablation. This was  discussed with the patient, who understands and wishes to proceed.                                                Veneda Melter, M.D. [facility]    NG/MEDQ  D:  06/10/2002  T:  06/13/2002  Job:  [id number]   cc:   Corena Pilgrim. Ladona Ridgel, M.D. [facility]   Arnoldo Hooker, M.D.

## 2011-01-17 NOTE — Assessment & Plan Note (Signed)
[facility]                           ELECTROPHYSIOLOGY OFFICE NOTE   NAME:Yvonne Newton, Yvonne Newton                   MRN:          [id number]  DATE:05/29/2006                            DOB:          1940-09-21    [name] returns today for follow up.  She is a very pleasant middle-  aged woman with a history of RV outflow tract, VT, diabetes, obesity, and  hypertension.  She returns today for follow up.  Her main complaint today is  that of some reflux symptoms.  She does have occasional palpitations and in  the last 6 months has had several episodes yearly.  These typically are not  associated with syncope and her heart rate only goes up to as high as 130 or  140 b.p.m. with these.  She has tolerated taking her beta-blocker very  nicely, now on Toprol 150 mg daily.  Unfortunately, she is frustrated by her  inability to lose weight.   EXAM:  She is a pleasant middle-aged obese woman in no distress.  Blood  pressure 146/85, the pulse 62 and regular, respirations were 18, the weight  was 197 pounds.  NECK:  No jugular venous distention.  LUNGS:  Clear bilaterally to auscultation.  CARDIOVASCULAR EXAM:  Distant with a regular rate and rhythm.  EXTREMITIES:  Trace peripheral edema bilaterally.   MEDICATIONS:  1. Toprol-XL 150 a day.  2. Glipizide ER.  3. Metformin.  4. Nexium.   IMPRESSION:  1. Right ventricle outflow tract ventricular tachycardia.  2. Hypertension.  3. Diabetes.  4. Obesity.  5. Reflux symptoms.   [name] is stable today from an arrhythmia perspective and I have  recommended that she continue on her Toprol 150 mg daily.  Her primary  doctor is in [place] and I have  recommended that she follow up with him about her reflux symptoms.  She may  well require endoscopy, but I will defer that.           ______________________________  Doylene Canning. Ladona Ridgel, MD    GWT/MedQ  DD:  05/29/2006 DT:  05/31/2006 Job #:   [id number]   cc:   Jerl Mina, Dr.

## 2011-04-07 ENCOUNTER — Encounter: Payer: Self-pay | Admitting: Internal Medicine

## 2011-05-07 ENCOUNTER — Encounter: Payer: Self-pay | Admitting: Internal Medicine

## 2011-05-07 ENCOUNTER — Ambulatory Visit (INDEPENDENT_AMBULATORY_CARE_PROVIDER_SITE_OTHER): Payer: Medicare Other | Admitting: Internal Medicine

## 2011-05-07 DIAGNOSIS — R0600 Dyspnea, unspecified: Secondary | ICD-10-CM

## 2011-05-07 DIAGNOSIS — I472 Ventricular tachycardia: Secondary | ICD-10-CM

## 2011-05-07 DIAGNOSIS — R0789 Other chest pain: Secondary | ICD-10-CM | POA: Insufficient documentation

## 2011-05-07 DIAGNOSIS — I10 Essential (primary) hypertension: Secondary | ICD-10-CM

## 2011-05-07 NOTE — Patient Instructions (Addendum)
Your physician has requested that you have a lexiscan myoview. For further information please visit [email or web address] Please follow instruction sheet, as given.  Your physician recommends that you continue on your current medications as directed. Please refer to the Current Medication list given to you today.  Your physician will schedule a follow-up appointment depending on results of your [facility].

## 2011-05-07 NOTE — Assessment & Plan Note (Signed)
This is a new problem. She has multiple cardiac risk factors. She is unable to walk any significant distance. She will undergo a a stress perfusion scan.

## 2011-05-07 NOTE — Assessment & Plan Note (Signed)
The patient's symptoms are fairly well controlled. She still has palpitations with exertion particularly bending over. For now, she will continue her current medical therapy including flecainide.

## 2011-05-07 NOTE — Assessment & Plan Note (Signed)
Her blood pressure is fairly well-controlled today. She will maintain a low-sodium diet and continue her current medical therapy.

## 2011-05-07 NOTE — Progress Notes (Signed)
HPI [name] turns today for followup. She is a 70 year old woman with a history of right ventricular outflow tract tachycardia, diabetes, hypertension, and obesity. The patient notes increasing dyspnea and chest pressure. At rest she feels well but when she exerts herself she notices that she has tightness in the chest and increasing shortness of breath. This resolves with rest. She has not had syncope. In addition with exertion she feels palpitations and the sensation that her heart is beating fast. The result of this is that the patient has become much more sedentary. She has minimal peripheral edema. Allergies  Allergen Reactions  . Sulfonamide Derivatives      Current Outpatient Prescriptions  Medication Sig Dispense Refill  . Cholecalciferol (VITAMIN D3) 50000 UNITS CAPS Take 1 tablet by mouth daily.        . flecainide (TAMBOCOR) 50 MG tablet TAKE 1 TABLET BY MOUTH 2 TIMES A DAY  60 tablet  6  . glipiZIDE (GLUCOTROL XL) 2.5 MG 24 hr tablet Take 2.5 mg by mouth daily.        . metFORMIN (GLUMETZA) 1000 MG (MOD) 24 hr tablet Take 1,000 mg by mouth daily.        . metoprolol (LOPRESSOR) 50 MG tablet Take 50 mg by mouth 2 (two) times daily.        Marland Kitchen omeprazole (PRILOSEC) 20 MG capsule Take 20 mg by mouth daily. prn       . traMADol (ULTRAM) 50 MG tablet Take 50 mg by mouth every 6 (six) hours as needed.           Past Medical History  Diagnosis Date  . Obesity   . DM (diabetes mellitus)   . Ventricular tachycardia     ROS:   All systems reviewed and negative except as noted in the HPI.   No past surgical history on file.   Family History  Problem Relation Age of Onset  . Heart attack Father 37  . Heart attack Brother 48  . Heart attack Brother     had CABG     History   Social History  . Marital Status: Widowed    Spouse Name: N/A    Number of Children: N/A  . Years of Education: N/A   Occupational History  . Not on file.   Social History Main Topics  .  Smoking status: Former Smoker    Quit date: 09/01/1998  . Smokeless tobacco: Not on file  . Alcohol Use: Not on file  . Drug Use: Not on file  . Sexually Active: Not on file   Other Topics Concern  . Not on file   Social History Narrative  . No narrative on file     BP 136/64  Pulse 64  Physical Exam:  Well appearing NAD HEENT: Unremarkable Neck:  No JVD, no thyromegally Lymphatics:  No adenopathy Back:  No CVA tenderness Lungs:  Clear with no wheezes, rales, or rhonchi. HEART:  Regular rate rhythm, no murmurs, no rubs, no clicks Abd:  soft, positive bowel sounds, no organomegally, no rebound, no guarding Ext:  2 plus pulses, no edema, no cyanosis, no clubbing Skin:  No rashes no nodules Neuro:  CN II through XII intact, motor grossly intact  EKG Normal sinus rhythm.  Assess/Plan:

## 2011-05-08 ENCOUNTER — Encounter: Payer: Self-pay | Admitting: *Deleted

## 2011-05-14 ENCOUNTER — Ambulatory Visit (HOSPITAL_COMMUNITY): Payer: Medicare Other | Attending: Internal Medicine | Admitting: Radiology

## 2011-05-14 DIAGNOSIS — I472 Ventricular tachycardia, unspecified: Secondary | ICD-10-CM | POA: Insufficient documentation

## 2011-05-14 DIAGNOSIS — R0989 Other specified symptoms and signs involving the circulatory and respiratory systems: Secondary | ICD-10-CM | POA: Insufficient documentation

## 2011-05-14 DIAGNOSIS — R0609 Other forms of dyspnea: Secondary | ICD-10-CM | POA: Insufficient documentation

## 2011-05-14 DIAGNOSIS — R0789 Other chest pain: Secondary | ICD-10-CM | POA: Insufficient documentation

## 2011-05-14 DIAGNOSIS — I4729 Other ventricular tachycardia: Secondary | ICD-10-CM | POA: Insufficient documentation

## 2011-05-14 DIAGNOSIS — R0600 Dyspnea, unspecified: Secondary | ICD-10-CM

## 2011-05-14 MED ORDER — TECHNETIUM TC 99M TETROFOSMIN IV KIT
11.0000 | PACK | Freq: Once | INTRAVENOUS | Status: AC | PRN
  Administered 2011-05-14: 11 via INTRAVENOUS

## 2011-05-14 MED ORDER — TECHNETIUM TC 99M TETROFOSMIN IV KIT
33.0000 | PACK | Freq: Once | INTRAVENOUS | Status: AC | PRN
  Administered 2011-05-14: 33 via INTRAVENOUS

## 2011-05-14 MED ORDER — REGADENOSON 0.4 MG/5ML IV SOLN
0.4000 mg | Freq: Once | INTRAVENOUS | Status: AC
  Administered 2011-05-14: 0.4 mg via INTRAVENOUS

## 2011-05-14 NOTE — Progress Notes (Signed)
[name] [address] [name] [address] [phone]  Cardiology Nuclear Med Study  Yvonne Newton is a 70 y.o. female [id number] 03-14-1941   Nuclear Med Background Indication for Stress Test:  Evaluation for Ischemia History:  '03 EP Study:Negative; ~'04 MPS @[facility] :No report, VT/LBBB>Cath:Normal per patient Cardiac Risk Factors: Family History - CAD, History of Smoking, Hypertension, LBBB and Obesity  Symptoms:  Chest Pressure/Tightness with Exertion (last episode of chest discomfort was last week), DOE, Nausea with Rapid HR (had rapid HR in shower this a.m.)   Nuclear Pre-Procedure Caffeine/Decaff Intake:  None NPO After: 5 pm   Lungs:  Clear.  O2 Sat 95% on RA. IV 0.9% NS with Angio Cath:  22g  IV Site: R Hand  IV Started by:  Bonnita Levan, RN  Chest Size (in):  40 Cup Size: C  Height: 5' (1.524 m)  Weight:  191 lb (86.637 kg)  BMI:  Body mass index is 37.30 kg/(m^2). Tech Comments:  Took Metoprolol this AM for rapid heart rate    Nuclear Med Study 1 or 2 day study: 1 day  Stress Test Type:  Treadmill/Lexiscan  Reading MD: [name], MD  Order Authorizing Provider:  Lewayne Bunting, MD  Resting Radionuclide: Technetium 34m Tetrofosmin  Resting Radionuclide Dose: 11.0 mCi   Stress Radionuclide:  Technetium 68m Tetrofosmin  Stress Radionuclide Dose: 33.0 mCi           Stress Protocol Rest HR: 61 Stress HR: 83  Rest BP: 140/66 Stress BP: 170/56  Exercise Time (min): 2:00 METS: n/a   Predicted Max HR: 150 bpm % Max HR: 55.33 bpm Rate Pressure Product: 41324   Dose of Adenosine (mg):  n/a Dose of Lexiscan: 0.4 mg  Dose of Atropine (mg): n/a Dose of Dobutamine: n/a mcg/kg/min (at max HR)  Stress Test Technologist: Smiley Houseman, CMA-N  Nuclear Technologist:  Domenic Polite, CNMT     Rest Procedure:  Myocardial perfusion imaging was performed at rest 45 minutes following the intravenous administration of Technetium 59m  Tetrofosmin.  Rest ECG: No acute changes.  Stress Procedure:  The patient received IV Lexiscan 0.4 mg over 15-seconds with concurrent low level exercise and then Technetium 12m Tetrofosmin was injected at 30-seconds while the patient continued walking one more minute.  There were no significant changes with Lexiscan.  Quantitative spect images were obtained after a 45-minute delay.  Stress ECG: No significant change from baseline ECG  QPS Raw Data Images:  Normal; no motion artifact; normal heart/lung ratio. Stress Images:  Anterior breast attenuation. Rest Images:  Same as stress Subtraction (SDS):  No evidence of ischemia. Transient Ischemic Dilatation (Normal <1.22):  0.91 Lung/Heart Ratio (Normal <0.45):  0.26  Quantitative Gated Spect Images QGS EDV:  78 ml QGS ESV:  20 ml QGS cine images:  Normal Wall Motion QGS EF: 75%  Impression Exercise Capacity:  Lexiscan with low level exercise. BP Response:  Normal blood pressure response. Clinical Symptoms:  SOB ECG Impression:  No significant ST segment change suggestive of ischemia. Comparison with Prior Nuclear Study: No images to compare  Overall Impression:  Normal stress nuclear study.  [name]

## 2011-05-19 ENCOUNTER — Other Ambulatory Visit (HOSPITAL_COMMUNITY): Payer: Medicare Other | Admitting: Radiology

## 2011-05-22 ENCOUNTER — Telehealth: Payer: Self-pay | Admitting: Internal Medicine

## 2011-05-22 NOTE — Telephone Encounter (Signed)
Pt calling wanting to know if everything was ok with pt Lexiscan. Pt said if everything was fine, there is on need to return pt call. Pt would only like call back if something negative was found in her Lexiscan.

## 2011-05-22 NOTE — Telephone Encounter (Signed)
Patient aware of results.

## 2011-06-12 ENCOUNTER — Other Ambulatory Visit: Payer: Self-pay

## 2011-06-12 MED ORDER — METOPROLOL TARTRATE 50 MG PO TABS: 50.0000 mg | ORAL_TABLET | Freq: Two times a day (BID) | ORAL | Status: DC

## 2011-06-20 ENCOUNTER — Ambulatory Visit: Payer: Self-pay | Admitting: Family Medicine

## 2011-07-07 ENCOUNTER — Other Ambulatory Visit: Payer: Self-pay

## 2011-07-07 MED ORDER — FLECAINIDE ACETATE 50 MG PO TABS: 50.0000 mg | ORAL_TABLET | Freq: Two times a day (BID) | ORAL | Status: DC

## 2012-02-05 ENCOUNTER — Other Ambulatory Visit: Payer: Self-pay | Admitting: Internal Medicine

## 2012-02-05 NOTE — Telephone Encounter (Signed)
..   Requested Prescriptions   Pending Prescriptions Disp Refills  . flecainide (TAMBOCOR) 50 MG tablet [Pharmacy Med Name: FLECAINIDE ACETATE 50 MG TAB] 60 tablet 2    Sig: TAKE 1 TABLET (50 MG TOTAL) BY MOUTH 2 (TWO) TIMES DAILY.

## 2012-05-12 ENCOUNTER — Telehealth: Payer: Self-pay | Admitting: Internal Medicine

## 2012-05-12 NOTE — Telephone Encounter (Signed)
plz return call to patient [phone] regarding doctors note for jury duty.  Pt says she is unable to walk from the parking lot [place] to the courthouse.

## 2012-05-12 NOTE — Telephone Encounter (Signed)
Pt has Yvonne Newton duty request for October 28 th, pt feels she will not be able to ambulate up the stairs and into the courthouse but also states her memory retention is poor. Would like medical statement letter to deny jury duty. Pt aware Dr/nurse out of office this week and understands she wont receive word till next week.

## 2012-05-27 ENCOUNTER — Encounter: Payer: Self-pay | Admitting: *Deleted

## 2012-05-27 ENCOUNTER — Encounter: Payer: Self-pay | Admitting: Internal Medicine

## 2012-05-27 NOTE — Telephone Encounter (Signed)
New Problem:    Patient called in needing an a letter excusing her from jury duty. Please call back.

## 2012-05-27 NOTE — Telephone Encounter (Signed)
This encounter was created in error - please disregard.

## 2012-05-27 NOTE — Telephone Encounter (Signed)
Letter mailed to patient.

## 2012-05-30 ENCOUNTER — Other Ambulatory Visit: Payer: Self-pay | Admitting: Internal Medicine

## 2012-06-16 ENCOUNTER — Ambulatory Visit: Payer: Medicare Other | Admitting: Physician Assistant

## 2012-07-07 ENCOUNTER — Encounter: Payer: Self-pay | Admitting: Internal Medicine

## 2012-07-07 ENCOUNTER — Ambulatory Visit (INDEPENDENT_AMBULATORY_CARE_PROVIDER_SITE_OTHER): Payer: Medicare Other | Admitting: Internal Medicine

## 2012-07-07 VITALS — BP 142/76 | HR 65 | Ht 60.0 in | Wt 193.0 lb

## 2012-07-07 DIAGNOSIS — R0789 Other chest pain: Secondary | ICD-10-CM

## 2012-07-07 DIAGNOSIS — I472 Ventricular tachycardia: Secondary | ICD-10-CM

## 2012-07-07 DIAGNOSIS — I10 Essential (primary) hypertension: Secondary | ICD-10-CM

## 2012-07-07 NOTE — Assessment & Plan Note (Signed)
She continues to have episodes of breakthrough tachycardia without frank syncope. I've recommended that she continue her flecainide. I've asked that she not miss any doses. I've recommended that she take additional flecainide as needed for breakthrough arrhythmia.

## 2012-07-07 NOTE — Progress Notes (Signed)
HPI Yvonne Newton returns today for followup. She is a very pleasant 71 year old woman with a history of RV outflow tract ventricular tachycardia, hypertension, dyslipidemia, and dietary noncompliance. In the interim, the patient notes occasional episodes of palpitations, often associated with missing her medications. She has had no syncope. She notes that if she forgets to take a dose of flecainide, she will feel her heart race. In addition, she has had episodes of heart racing despite taking her medical therapy. She does have intermittent nonexertional chest discomfort but previously had a negative stress test. Allergies  Allergen Reactions  . Sulfonamide Derivatives      Current Outpatient Prescriptions  Medication Sig Dispense Refill  . Cholecalciferol (VITAMIN D3) 50000 UNITS CAPS Take 1 tablet by mouth daily.        . flecainide (TAMBOCOR) 50 MG tablet TAKE 1 TABLET (50 MG TOTAL) BY MOUTH 2 (TWO) TIMES DAILY.  60 tablet  2  . metFORMIN (GLUMETZA) 1000 MG (MOD) 24 hr tablet Take 1,000 mg by mouth daily.        . metoprolol (LOPRESSOR) 50 MG tablet TAKE 1 TABLET BY MOUTH TWICE DAILY  60 tablet  0  . omeprazole (PRILOSEC) 20 MG capsule Take 20 mg by mouth daily. prn       . traMADol (ULTRAM) 50 MG tablet Take 50 mg by mouth every 6 (six) hours as needed.           Past Medical History  Diagnosis Date  . Obesity   . DM (diabetes mellitus)   . Ventricular tachycardia     ROS:   All systems reviewed and negative except as noted in the HPI.   No past surgical history on file.   Family History  Problem Relation Age of Onset  . Heart attack Father 39  . Heart attack Brother 48  . Heart attack Brother     had CABG     History   Social History  . Marital Status: Widowed    Spouse Name: N/A    Number of Children: N/A  . Years of Education: N/A   Occupational History  . Not on file.   Social History Main Topics  . Smoking status: Former Smoker    Quit date: 09/01/1998   . Smokeless tobacco: Not on file  . Alcohol Use: Not on file  . Drug Use: Not on file  . Sexually Active: Not on file   Other Topics Concern  . Not on file   Social History Narrative  . No narrative on file     BP 142/76  Pulse 65  Ht 5' (1.524 m)  Wt 193 lb (87.544 kg)  BMI 37.69 kg/m2  SpO2 97%  Physical Exam:  Well appearing 71 year old woman, NAD HEENT: Unremarkable Neck:  No JVD, no thyromegally Lungs:  Clear with no wheezes, rales, or rhonchi. HEART:  Regular rate rhythm, no murmurs, no rubs, no clicks Abd:  soft, positive bowel sounds, no organomegally, no rebound, no guarding Ext:  2 plus pulses, no edema, no cyanosis, no clubbing Skin:  No rashes no nodules Neuro:  CN II through XII intact, motor grossly intact  EKG normal sinus rhythm  Assess/Plan:

## 2012-07-07 NOTE — Patient Instructions (Signed)
Your physician wants you to follow-up in: 6 months with Dr Taylor You will receive a reminder letter in the mail two months in advance. If you don't receive a letter, please call our office to schedule the follow-up appointment.  

## 2012-07-07 NOTE — Assessment & Plan Note (Signed)
Nonexertional, and not typical for coronary ischemia.

## 2012-07-07 NOTE — Assessment & Plan Note (Signed)
She admits to sodium indiscretion and occasional medical noncompliance. I've asked the patient to work on both. She is asked to refrain from eating salty food. She is asked to lose weight.

## 2012-08-05 ENCOUNTER — Other Ambulatory Visit: Payer: Self-pay | Admitting: *Deleted

## 2012-08-05 MED ORDER — METOPROLOL TARTRATE 50 MG PO TABS: 25.0000 mg | ORAL_TABLET | Freq: Two times a day (BID) | ORAL | Status: DC

## 2012-08-30 ENCOUNTER — Ambulatory Visit: Payer: Self-pay | Admitting: Family Medicine

## 2012-09-06 ENCOUNTER — Ambulatory Visit: Payer: Self-pay | Admitting: Family Medicine

## 2012-11-08 ENCOUNTER — Ambulatory Visit: Payer: Medicare Other | Admitting: Internal Medicine

## 2012-11-30 ENCOUNTER — Ambulatory Visit: Payer: Medicare Other | Admitting: Internal Medicine

## 2013-03-07 ENCOUNTER — Ambulatory Visit: Payer: Self-pay | Admitting: Family Medicine

## 2014-08-08 ENCOUNTER — Ambulatory Visit: Payer: Self-pay | Admitting: Family Medicine

## 2015-11-30 DIAGNOSIS — E119 Type 2 diabetes mellitus without complications: Secondary | ICD-10-CM | POA: Diagnosis not present

## 2016-01-07 DIAGNOSIS — E119 Type 2 diabetes mellitus without complications: Secondary | ICD-10-CM | POA: Diagnosis not present

## 2016-01-07 DIAGNOSIS — I10 Essential (primary) hypertension: Secondary | ICD-10-CM | POA: Diagnosis not present

## 2016-01-14 DIAGNOSIS — D72820 Lymphocytosis (symptomatic): Secondary | ICD-10-CM | POA: Diagnosis not present

## 2016-01-14 DIAGNOSIS — I10 Essential (primary) hypertension: Secondary | ICD-10-CM | POA: Diagnosis not present

## 2016-01-14 DIAGNOSIS — E119 Type 2 diabetes mellitus without complications: Secondary | ICD-10-CM | POA: Diagnosis not present

## 2016-01-14 DIAGNOSIS — Z8679 Personal history of other diseases of the circulatory system: Secondary | ICD-10-CM | POA: Diagnosis not present

## 2016-01-16 ENCOUNTER — Other Ambulatory Visit: Payer: Self-pay | Admitting: Family Medicine

## 2016-01-16 DIAGNOSIS — Z1231 Encounter for screening mammogram for malignant neoplasm of breast: Secondary | ICD-10-CM

## 2016-01-30 ENCOUNTER — Other Ambulatory Visit: Payer: Self-pay | Admitting: Family Medicine

## 2016-01-30 ENCOUNTER — Ambulatory Visit
Admission: RE | Admit: 2016-01-30 | Discharge: 2016-01-30 | Disposition: A | Discharge status: Home / Self Care | Payer: PPO | Source: Ambulatory Visit | Attending: Family Medicine | Admitting: Family Medicine

## 2016-01-30 DIAGNOSIS — Z1231 Encounter for screening mammogram for malignant neoplasm of breast: Secondary | ICD-10-CM

## 2016-02-05 ENCOUNTER — Ambulatory Visit (INDEPENDENT_AMBULATORY_CARE_PROVIDER_SITE_OTHER): Payer: PPO | Admitting: Internal Medicine

## 2016-02-05 ENCOUNTER — Encounter: Payer: Self-pay | Admitting: Internal Medicine

## 2016-02-05 ENCOUNTER — Telehealth: Payer: Self-pay | Admitting: Internal Medicine

## 2016-02-05 VITALS — BP 158/54 | HR 73 | Ht 60.0 in | Wt 186.6 lb

## 2016-02-05 DIAGNOSIS — R000 Tachycardia, unspecified: Secondary | ICD-10-CM | POA: Diagnosis not present

## 2016-02-05 MED ORDER — VERAPAMIL HCL ER 180 MG PO TBCR: 180.0000 mg | EXTENDED_RELEASE_TABLET | Freq: Every day | ORAL | Status: DC

## 2016-02-05 NOTE — Progress Notes (Signed)
HPI [name] returns today to re-establish after a 4 year absence from our clinic.Marland Kitchen. She is a very pleasant 75 year old woman with a history of RV outflow tract ventricular tachycardia, hypertension, dyslipidemia, and dietary noncompliance. In the interim, the he has had no syncope. She notes that if she forgets to take a dose of flecainide, she will feel her heart race. In addition, she has had episodes of heart racing despite taking her medical therapy. She does have non-cardiac back pain which is not exertional and improved when she belches. The patient has been bothered by diarrhea for almost a year. She has about 5 bm's a day. Allergies  Allergen Reactions  . Sulfa Antibiotics Itching  . Sulfonamide Derivatives Itching  . Other Rash and Swelling    METAL     Current Outpatient Prescriptions  Medication Sig Dispense Refill  . amLODipine (NORVASC) 2.5 MG tablet Take 1 tablet by mouth 2 (two) times daily.    . calcium-vitamin D (SM CALCIUM 500/VITAMIN D3) 500-400 MG-UNIT tablet Take 1 tablet by mouth 2 (two) times daily with a meal.    . flecainide (TAMBOCOR) 50 MG tablet TAKE 1 TABLET (50 MG TOTAL) BY MOUTH 2 (TWO) TIMES DAILY. 60 tablet 2  . meloxicam (MOBIC) 15 MG tablet Take 15 mg by mouth daily.    . metFORMIN (GLUCOPHAGE-XR) 500 MG 24 hr tablet Take 1 tablet by mouth in the morning and 2 tablets in the evening    . metoprolol (LOPRESSOR) 50 MG tablet Take 1 tablet by mouth 2 (two) times daily.    Marland Kitchen. omeprazole (PRILOSEC) 20 MG capsule Take 20 mg by mouth daily. prn     . traMADol (ULTRAM) 50 MG tablet Take 50 mg by mouth every 6 (six) hours as needed (pain).     . Vitamin D, Ergocalciferol, (DRISDOL) 50000 units CAPS capsule Take 1 capsule by mouth once a week.     No current facility-administered medications for this visit.     Past Medical History  Diagnosis Date  . Obesity   . DM (diabetes mellitus) (HCC)   . Ventricular tachycardia (HCC)     ROS:   All systems  reviewed and negative except as noted in the HPI.   Past Surgical History  Procedure Laterality Date  . Breast biopsy Left 2010    Negative     Family History  Problem Relation Age of Onset  . Heart attack Father [id number]  . Heart attack Brother 48  . Heart attack Brother     had CABG  . Breast cancer Neg Hx      Social History   Social History  . Marital Status: Married    Spouse Name: N/A  . Number of Children: N/A  . Years of Education: N/A   Occupational History  . Not on file.   Social History Main Topics  . Smoking status: Former Smoker    Quit date: 09/01/1998  . Smokeless tobacco: Not on file  . Alcohol Use: Not on file  . Drug Use: Not on file  . Sexual Activity: Not on file   Other Topics Concern  . Not on file   Social History Narrative     BP 158/54 mmHg  Pulse 73  Ht 5' (1.524 m)  Wt 186 lb 9.6 oz (84.641 kg)  BMI 36.44 kg/m2  Physical Exam:  Well appearing 75 year old woman, NAD HEENT: Unremarkable Neck:  6 cm JVD, no thyromegally Lungs:  Clear with no wheezes, rales, or rhonchi.  HEART:  Regular rate rhythm, no murmurs, no rubs, no clicks Abd:  soft, positive bowel sounds, no organomegally, no rebound, no guarding Ext:  2 plus pulses, no edema, no cyanosis, no clubbing Skin:  No rashes no nodules Neuro:  CN II through XII intact, motor grossly intact  EKG normal sinus rhythm  Assess/Plan:  1. VT - she will continue her Flecainide. I will stop her metoprolol and switch her to verapamil in hopes of improving her diarrhea. 2. Diarrhea - she will start verapamil. 3. HTN - her blood pressure has been reasonably well controlled. Will follow.  4. Obesity - weight loss has been recommended. She has not lost any weight despite her GI problems.  Yvonne Newton.D.

## 2016-02-05 NOTE — Telephone Encounter (Signed)
New message  Pt c/o medication issue:  4. What is your medication issue? Pt called states that she was taken off of metoprolol and was placed verapamil (CALAN-SR) 180 MG CR tablet And her pharmacy states that she cannot take this medication because she is currently taking amlodipine. Please call back to discuss.

## 2016-02-05 NOTE — Patient Instructions (Signed)
  Medication Instructions:  Your physician has recommended you make the following change in your medication:  1) Stop Metoprolol 2) Start Verapamil 180 mg daily    Labwork: None ordered   Testing/Procedures: None ordered   Follow-Up: Your physician wants you to follow-up in: 6 months with Dr Court Joyaylor You will receive a reminder letter in the mail two months in advance. If you don't receive a letter, please call our office to schedule the follow-up appointment.   Any Other Special Instructions Will Be Listed Below (If Applicable).     If you need a refill on your cardiac medications before your next appointment, please call your pharmacy.

## 2016-02-05 NOTE — Telephone Encounter (Signed)
Discussed with Dr Ladona Ridgelaylor, will stop Amlodipine.  I have spoken with patient and will let pharmacy know

## 2016-07-09 DIAGNOSIS — I10 Essential (primary) hypertension: Secondary | ICD-10-CM | POA: Diagnosis not present

## 2016-07-09 DIAGNOSIS — E119 Type 2 diabetes mellitus without complications: Secondary | ICD-10-CM | POA: Diagnosis not present

## 2016-07-16 DIAGNOSIS — Z0000 Encounter for general adult medical examination without abnormal findings: Secondary | ICD-10-CM | POA: Diagnosis not present

## 2016-07-16 DIAGNOSIS — M25562 Pain in left knee: Secondary | ICD-10-CM | POA: Diagnosis not present

## 2016-07-16 DIAGNOSIS — E785 Hyperlipidemia, unspecified: Secondary | ICD-10-CM | POA: Diagnosis not present

## 2016-07-16 DIAGNOSIS — Z23 Encounter for immunization: Secondary | ICD-10-CM | POA: Diagnosis not present

## 2016-07-16 DIAGNOSIS — M5432 Sciatica, left side: Secondary | ICD-10-CM | POA: Diagnosis not present

## 2016-07-16 DIAGNOSIS — I10 Essential (primary) hypertension: Secondary | ICD-10-CM | POA: Diagnosis not present

## 2016-07-16 DIAGNOSIS — E119 Type 2 diabetes mellitus without complications: Secondary | ICD-10-CM | POA: Diagnosis not present

## 2016-08-13 ENCOUNTER — Other Ambulatory Visit: Payer: Self-pay | Admitting: Orthopedic Surgery

## 2016-08-13 DIAGNOSIS — M5136 Other intervertebral disc degeneration, lumbar region: Secondary | ICD-10-CM | POA: Diagnosis not present

## 2016-08-13 DIAGNOSIS — M5416 Radiculopathy, lumbar region: Secondary | ICD-10-CM | POA: Diagnosis not present

## 2016-08-13 DIAGNOSIS — M47816 Spondylosis without myelopathy or radiculopathy, lumbar region: Secondary | ICD-10-CM

## 2016-09-04 ENCOUNTER — Ambulatory Visit
Admission: RE | Admit: 2016-09-04 | Discharge: 2016-09-04 | Disposition: A | Discharge status: Home / Self Care | Payer: PPO | Source: Ambulatory Visit | Attending: Orthopedic Surgery | Admitting: Orthopedic Surgery

## 2016-09-04 DIAGNOSIS — M5126 Other intervertebral disc displacement, lumbar region: Secondary | ICD-10-CM | POA: Diagnosis not present

## 2016-09-04 DIAGNOSIS — M48061 Spinal stenosis, lumbar region without neurogenic claudication: Secondary | ICD-10-CM | POA: Insufficient documentation

## 2016-09-04 DIAGNOSIS — M5127 Other intervertebral disc displacement, lumbosacral region: Secondary | ICD-10-CM | POA: Diagnosis not present

## 2016-09-04 DIAGNOSIS — M1288 Other specific arthropathies, not elsewhere classified, other specified site: Secondary | ICD-10-CM | POA: Diagnosis not present

## 2016-09-04 DIAGNOSIS — M47816 Spondylosis without myelopathy or radiculopathy, lumbar region: Secondary | ICD-10-CM | POA: Diagnosis not present

## 2016-09-04 DIAGNOSIS — M5416 Radiculopathy, lumbar region: Secondary | ICD-10-CM | POA: Diagnosis not present

## 2016-09-04 DIAGNOSIS — M545 Low back pain: Secondary | ICD-10-CM | POA: Diagnosis not present

## 2016-09-04 DIAGNOSIS — M47896 Other spondylosis, lumbar region: Secondary | ICD-10-CM | POA: Insufficient documentation

## 2016-09-04 DIAGNOSIS — M4807 Spinal stenosis, lumbosacral region: Secondary | ICD-10-CM | POA: Diagnosis not present

## 2016-12-24 DIAGNOSIS — M1712 Unilateral primary osteoarthritis, left knee: Secondary | ICD-10-CM | POA: Diagnosis not present

## 2016-12-24 DIAGNOSIS — M25562 Pain in left knee: Secondary | ICD-10-CM | POA: Diagnosis not present

## 2017-01-13 DIAGNOSIS — E119 Type 2 diabetes mellitus without complications: Secondary | ICD-10-CM | POA: Diagnosis not present

## 2017-01-29 DIAGNOSIS — E119 Type 2 diabetes mellitus without complications: Secondary | ICD-10-CM | POA: Diagnosis not present

## 2017-01-29 DIAGNOSIS — M25562 Pain in left knee: Secondary | ICD-10-CM | POA: Diagnosis not present

## 2017-01-29 DIAGNOSIS — M545 Low back pain: Secondary | ICD-10-CM | POA: Diagnosis not present

## 2017-01-29 DIAGNOSIS — I10 Essential (primary) hypertension: Secondary | ICD-10-CM | POA: Diagnosis not present

## 2017-01-29 DIAGNOSIS — E785 Hyperlipidemia, unspecified: Secondary | ICD-10-CM | POA: Diagnosis not present

## 2017-03-06 ENCOUNTER — Other Ambulatory Visit: Payer: Self-pay | Admitting: Family Medicine

## 2017-03-06 DIAGNOSIS — Z1231 Encounter for screening mammogram for malignant neoplasm of breast: Secondary | ICD-10-CM

## 2017-03-17 ENCOUNTER — Ambulatory Visit
Admission: RE | Admit: 2017-03-17 | Discharge: 2017-03-17 | Disposition: A | Discharge status: Home / Self Care | Payer: PPO | Source: Ambulatory Visit | Attending: Family Medicine | Admitting: Family Medicine

## 2017-03-17 DIAGNOSIS — Z1231 Encounter for screening mammogram for malignant neoplasm of breast: Secondary | ICD-10-CM | POA: Insufficient documentation

## 2017-03-26 DIAGNOSIS — L03011 Cellulitis of right finger: Secondary | ICD-10-CM | POA: Diagnosis not present

## 2017-03-30 DIAGNOSIS — L03011 Cellulitis of right finger: Secondary | ICD-10-CM | POA: Diagnosis not present

## 2017-03-30 DIAGNOSIS — B351 Tinea unguium: Secondary | ICD-10-CM | POA: Diagnosis not present

## 2017-05-27 DIAGNOSIS — M1711 Unilateral primary osteoarthritis, right knee: Secondary | ICD-10-CM | POA: Diagnosis not present

## 2017-05-27 DIAGNOSIS — M179 Osteoarthritis of knee, unspecified: Secondary | ICD-10-CM | POA: Diagnosis not present

## 2017-05-27 DIAGNOSIS — M25561 Pain in right knee: Secondary | ICD-10-CM | POA: Diagnosis not present

## 2017-05-27 DIAGNOSIS — M79604 Pain in right leg: Secondary | ICD-10-CM | POA: Diagnosis not present

## 2017-05-27 DIAGNOSIS — M79605 Pain in left leg: Secondary | ICD-10-CM | POA: Diagnosis not present

## 2017-06-12 DIAGNOSIS — M1711 Unilateral primary osteoarthritis, right knee: Secondary | ICD-10-CM | POA: Diagnosis not present

## 2017-06-12 DIAGNOSIS — M25561 Pain in right knee: Secondary | ICD-10-CM | POA: Diagnosis not present

## 2017-07-22 ENCOUNTER — Encounter: Payer: Self-pay | Admitting: Internal Medicine

## 2017-07-22 ENCOUNTER — Ambulatory Visit: Payer: PPO | Admitting: Internal Medicine

## 2017-07-22 VITALS — BP 142/64 | HR 64 | Ht 60.0 in | Wt 184.0 lb

## 2017-07-22 DIAGNOSIS — E785 Hyperlipidemia, unspecified: Secondary | ICD-10-CM

## 2017-07-22 DIAGNOSIS — Z5181 Encounter for therapeutic drug level monitoring: Secondary | ICD-10-CM | POA: Diagnosis not present

## 2017-07-22 DIAGNOSIS — I10 Essential (primary) hypertension: Secondary | ICD-10-CM | POA: Insufficient documentation

## 2017-07-22 DIAGNOSIS — M1990 Unspecified osteoarthritis, unspecified site: Secondary | ICD-10-CM

## 2017-07-22 DIAGNOSIS — R000 Tachycardia, unspecified: Secondary | ICD-10-CM

## 2017-07-22 DIAGNOSIS — E118 Type 2 diabetes mellitus with unspecified complications: Secondary | ICD-10-CM | POA: Insufficient documentation

## 2017-07-22 DIAGNOSIS — I472 Ventricular tachycardia, unspecified: Secondary | ICD-10-CM | POA: Insufficient documentation

## 2017-07-22 DIAGNOSIS — Z79899 Other long term (current) drug therapy: Secondary | ICD-10-CM

## 2017-07-22 DIAGNOSIS — I4729 Other ventricular tachycardia: Secondary | ICD-10-CM

## 2017-07-22 DIAGNOSIS — E119 Type 2 diabetes mellitus without complications: Secondary | ICD-10-CM | POA: Insufficient documentation

## 2017-07-22 DIAGNOSIS — T7840XA Allergy, unspecified, initial encounter: Secondary | ICD-10-CM | POA: Insufficient documentation

## 2017-07-22 HISTORY — DX: Hyperlipidemia, unspecified: E78.5

## 2017-07-22 HISTORY — DX: Unspecified osteoarthritis, unspecified site: M19.90

## 2017-07-22 HISTORY — DX: Tachycardia, unspecified: R00.0

## 2017-07-22 HISTORY — DX: Essential (primary) hypertension: I10

## 2017-07-22 NOTE — Progress Notes (Signed)
HPI Yvonne Yvonne Newton Yvonne Newton returns today for ongoing evaluation and management of right ventricular outflow tract tachycardia. I've seen the patient for this problem for many years. It is been reasonably well controlled. She has been on flecainide. She also takes beta blockers. In the interim she has had occasional episodes of very brief tachypalpitations. No syncope. She has chronic chest pain. She has class II dyspnea. She has a history of preserved left ventricular function. Allergies  Allergen Reactions  . Sulfa Antibiotics Itching  . Sulfonamide Derivatives Itching  . Other Rash and Swelling    METAL     Current Outpatient Medications  Medication Sig Dispense Refill  . amLODipine (NORVASC) 2.5 MG tablet Take 2.5 mg by mouth 2 (two) times daily.    . flecainide (TAMBOCOR) 50 MG tablet TAKE 1 TABLET (50 MG TOTAL) BY MOUTH 2 (TWO) TIMES DAILY. 60 tablet 2  . meloxicam (MOBIC) 15 MG tablet Take 15 mg by mouth daily.    . metFORMIN (GLUCOPHAGE-XR) 500 MG 24 hr tablet Take 1 tablet by mouth in the morning and 2 tablets in the evening    . metoprolol tartrate (LOPRESSOR) 50 MG tablet Take 50 mg by mouth 2 (two) times daily.    Marland Kitchen. omeprazole (PRILOSEC) 20 MG capsule Take 20 mg by mouth daily. prn     . traMADol (ULTRAM) 50 MG tablet Take 50 mg by mouth every 6 (six) hours as needed (pain).      No current facility-administered medications for this visit.      Past Medical History:  Diagnosis Date  . DM (diabetes mellitus) (HCC)   . Obesity   . Ventricular tachycardia (HCC)     ROS:   All systems reviewed and negative except as noted in the HPI.   Past Surgical History:  Procedure Laterality Date  . BREAST BIOPSY Left 2010   Negative     Family History  Problem Relation Age of Onset  . Heart attack Father [id number]  . Heart attack Brother 48  . Heart attack Brother        had CABG  . Breast cancer Neg Hx      Social History   Socioeconomic History  . Marital status:  Married    Spouse name: Not on file  . Number of children: Not on file  . Years of education: Not on file  . Highest education level: Not on file  Social Needs  . Financial resource strain: Not on file  . Food insecurity - worry: Not on file  . Food insecurity - inability: Not on file  . Transportation needs - medical: Not on file  . Transportation needs - non-medical: Not on file  Occupational History  . Not on file  Tobacco Use  . Smoking status: Former Smoker    Last attempt to quit: 09/01/1998    Years since quitting: 18.9  . Smokeless tobacco: Never Used  Substance and Sexual Activity  . Alcohol use: Not on file  . Drug use: Not on file  . Sexual activity: Not on file  Other Topics Concern  . Not on file  Social History Narrative  . Not on file     BP (!) 142/64   Pulse 64   Ht 5' (1.524 m)   Wt 184 lb (83.5 kg)   BMI 35.94 kg/m   Physical Exam:  Well appearing 76 year old woman, NAD HEENT: Unremarkable Neck:  6 cm JVD, no thyromegally Lymphatics:  No adenopathy Back:  No CVA tenderness Lungs:  Clear, with no wheezes, rales, or rhonchi. HEART:  Regular rate rhythm, no murmurs, no rubs, no clicks Abd:  soft, positive bowel sounds, no organomegally, no rebound, no guarding Ext:  2 plus pulses, trace peripheral edema, no cyanosis, no clubbing Skin:  No rashes no nodules Neuro:  CN II through XII intact, motor grossly intact  EKG - normal sinus rhythm  Assess/Plan: 1. Ventricular tachycardia -her symptoms are well-controlled. She will continue medical therapy with flecainide. 2. Obesity - she is encouraged to lose weight. 3. Hypertension - her systolic blood pressure remains elevated. We discussed the importance of a low-sodium diet. I will hold off on up titration of her medical therapy at this time.  Yvonne Yvonne Newton Yvonne Newton, M.D.

## 2017-07-22 NOTE — Patient Instructions (Addendum)

## 2017-07-27 NOTE — Addendum Note (Signed)
Addended by: Micki RileySHOFFNER, Rolen Conger C on: 07/27/2017 03:46 PM   Modules accepted: Orders

## 2017-09-29 DIAGNOSIS — E119 Type 2 diabetes mellitus without complications: Secondary | ICD-10-CM | POA: Diagnosis not present

## 2017-09-29 DIAGNOSIS — E785 Hyperlipidemia, unspecified: Secondary | ICD-10-CM | POA: Diagnosis not present

## 2017-09-29 DIAGNOSIS — I10 Essential (primary) hypertension: Secondary | ICD-10-CM | POA: Diagnosis not present

## 2017-10-06 DIAGNOSIS — R5381 Other malaise: Secondary | ICD-10-CM | POA: Diagnosis not present

## 2017-10-06 DIAGNOSIS — E1165 Type 2 diabetes mellitus with hyperglycemia: Secondary | ICD-10-CM | POA: Diagnosis not present

## 2017-10-06 DIAGNOSIS — I10 Essential (primary) hypertension: Secondary | ICD-10-CM | POA: Diagnosis not present

## 2017-10-06 DIAGNOSIS — R5383 Other fatigue: Secondary | ICD-10-CM | POA: Diagnosis not present

## 2017-10-06 DIAGNOSIS — Z0000 Encounter for general adult medical examination without abnormal findings: Secondary | ICD-10-CM | POA: Diagnosis not present

## 2017-10-06 DIAGNOSIS — Z23 Encounter for immunization: Secondary | ICD-10-CM | POA: Diagnosis not present

## 2017-10-06 DIAGNOSIS — E785 Hyperlipidemia, unspecified: Secondary | ICD-10-CM | POA: Diagnosis not present

## 2017-10-06 DIAGNOSIS — E538 Deficiency of other specified B group vitamins: Secondary | ICD-10-CM | POA: Diagnosis not present

## 2017-10-21 DIAGNOSIS — E538 Deficiency of other specified B group vitamins: Secondary | ICD-10-CM | POA: Diagnosis not present

## 2017-10-27 ENCOUNTER — Emergency Department (HOSPITAL_COMMUNITY)
Admission: EM | Admit: 2017-10-27 | Discharge: 2017-10-27 | Disposition: A | Discharge status: Home / Self Care | Payer: PPO | Attending: Emergency Medicine | Admitting: Emergency Medicine

## 2017-10-27 ENCOUNTER — Encounter (HOSPITAL_COMMUNITY): Payer: Self-pay | Admitting: Family Medicine

## 2017-10-27 DIAGNOSIS — L500 Allergic urticaria: Secondary | ICD-10-CM | POA: Diagnosis not present

## 2017-10-27 DIAGNOSIS — Z87891 Personal history of nicotine dependence: Secondary | ICD-10-CM | POA: Diagnosis not present

## 2017-10-27 DIAGNOSIS — Z7984 Long term (current) use of oral hypoglycemic drugs: Secondary | ICD-10-CM | POA: Insufficient documentation

## 2017-10-27 DIAGNOSIS — R062 Wheezing: Secondary | ICD-10-CM | POA: Diagnosis not present

## 2017-10-27 DIAGNOSIS — T781XXA Other adverse food reactions, not elsewhere classified, initial encounter: Secondary | ICD-10-CM | POA: Diagnosis not present

## 2017-10-27 DIAGNOSIS — E119 Type 2 diabetes mellitus without complications: Secondary | ICD-10-CM | POA: Diagnosis not present

## 2017-10-27 DIAGNOSIS — T7840XA Allergy, unspecified, initial encounter: Secondary | ICD-10-CM | POA: Diagnosis not present

## 2017-10-27 DIAGNOSIS — R069 Unspecified abnormalities of breathing: Secondary | ICD-10-CM | POA: Diagnosis not present

## 2017-10-27 DIAGNOSIS — Z79899 Other long term (current) drug therapy: Secondary | ICD-10-CM | POA: Diagnosis not present

## 2017-10-27 DIAGNOSIS — R0602 Shortness of breath: Secondary | ICD-10-CM | POA: Diagnosis not present

## 2017-10-27 DIAGNOSIS — L299 Pruritus, unspecified: Secondary | ICD-10-CM | POA: Diagnosis not present

## 2017-10-27 MED ORDER — PREDNISONE 20 MG PO TABS: 60.0000 mg | ORAL_TABLET | Freq: Every day | ORAL | 0 refills | Status: DC

## 2017-10-27 MED ORDER — EPINEPHRINE 0.3 MG/0.3ML IJ SOAJ: 0.3000 mg | Freq: Once | INTRAMUSCULAR | 0 refills | Status: AC

## 2017-10-27 MED ORDER — FAMOTIDINE 20 MG PO TABS: 20.0000 mg | ORAL_TABLET | Freq: Two times a day (BID) | ORAL | 0 refills | Status: DC

## 2017-10-27 MED ORDER — FAMOTIDINE IN NACL 20-0.9 MG/50ML-% IV SOLN
20.0000 mg | Freq: Once | INTRAVENOUS | Status: AC
  Administered 2017-10-27: 20 mg via INTRAVENOUS
  Filled 2017-10-27: qty 50

## 2017-10-27 NOTE — ED Provider Notes (Signed)
TIME SEEN: 12:57 AM  CHIEF COMPLAINT: Allergic reaction  HPI: Patient is a 77 year old female with history of diabetes, hypertension, obesity who presents to the emergency department with an allergic reaction.  States around 10 PM she ate a blueberry muffin and then shortly afterwards developed diffuse hives, itching and shortness of breath with wheezing.  Daughter gave her some of her Ventolin at home without relief.  With EMS patient was given 5 mg of albuterol and a nebulizer treatment, 125 mg of IV Solu-Medrol and 50 mg of oral Benadryl.  Reports now feeling much better.  Hives have almost completely resolved.  No lip or tongue swelling.  No shortness of breath or wheezing.  No hypertension.  Denies any other new exposures including new soaps, lotions, detergents or medications.  ROS: See HPI Constitutional: no fever  Eyes: no drainage  ENT: no runny nose   Cardiovascular:  no chest pain  Resp:  SOB  GI: no vomiting GU: no dysuria Integumentary: no rash  Allergy:  hives  Musculoskeletal: no leg swelling  Neurological: no slurred speech ROS otherwise negative  PAST MEDICAL HISTORY/PAST SURGICAL HISTORY:  Past Medical History:  Diagnosis Date  . DM (diabetes mellitus) (HCC)   . Obesity   . Ventricular tachycardia (HCC)     MEDICATIONS:  Prior to Admission medications   Medication Sig Start Date End Date Taking? Authorizing Provider  amLODipine (NORVASC) 2.5 MG tablet Take 2.5 mg by mouth 2 (two) times daily. 02/26/17   [provider]  flecainide (TAMBOCOR) 50 MG tablet TAKE 1 TABLET (50 MG TOTAL) BY MOUTH 2 (TWO) TIMES DAILY. 02/05/12   Marinus Mawaylor, Gregg W, MD  meloxicam (MOBIC) 15 MG tablet Take 15 mg by mouth daily.    [provider]  metFORMIN (GLUCOPHAGE-XR) 500 MG 24 hr tablet Take 1 tablet by mouth in the morning and 2 tablets in the evening 01/25/16   [provider]  metoprolol tartrate (LOPRESSOR) 50 MG tablet Take 50 mg by mouth 2 (two) times daily.  04/27/17   [provider]  omeprazole (PRILOSEC) 20 MG capsule Take 20 mg by mouth daily. prn     [provider]  traMADol (ULTRAM) 50 MG tablet Take 50 mg by mouth every 6 (six) hours as needed (pain).     [provider]    ALLERGIES:  Allergies  Allergen Reactions  . Sulfa Antibiotics Itching  . Sulfonamide Derivatives Itching  . Other Rash and Swelling    METAL    SOCIAL HISTORY:  Social History   Tobacco Use  . Smoking status: Former Smoker    Last attempt to quit: 09/01/1998    Years since quitting: 19.1  . Smokeless tobacco: Never Used  Substance Use Topics  . Alcohol use: Not on file    FAMILY HISTORY: Family History  Problem Relation Age of Onset  . Heart attack Father 3165  . Heart attack Brother 48  . Heart attack Brother        had CABG  . Breast cancer Neg Hx     EXAM: BP (!) 170/63 (BP Location: Left Arm)   Pulse 81   Temp 97.9 F (36.6 C) (Oral)   Resp 15   SpO2 97%  CONSTITUTIONAL: Alert and oriented and responds appropriately to questions. Well-appearing; well-nourished HEAD: Normocephalic EYES: Conjunctivae clear, pupils appear equal, EOMI ENT: normal nose; moist mucous membranes; No pharyngeal erythema or petechiae, no tonsillar hypertrophy or exudate, no uvular deviation, no unilateral swelling, no trismus or  drooling, no muffled voice, normal phonation, no stridor, no dental caries present, no drainable dental abscess noted, no Ludwig's angina, tongue sits flat in the bottom of the mouth, no angioedema, no facial erythema or warmth, no facial swelling; no pain with movement of the neck. NECK: Supple, no meningismus, no nuchal rigidity, no LAD  CARD: RRR; S1 and S2 appreciated; no murmurs, no clicks, no rubs, no gallops RESP: Normal chest excursion without splinting or tachypnea; breath sounds clear and equal bilaterally; no wheezes, no rhonchi, no rales, no hypoxia or respiratory distress, speaking full sentences ABD/GI:  Normal bowel sounds; non-distended; soft, non-tender, no rebound, no guarding, no peritoneal signs, no hepatosplenomegaly BACK:  The back appears normal and is non-tender to palpation, there is no CVA tenderness EXT: Normal ROM in all joints; non-tender to palpation; no edema; normal capillary refill; no cyanosis, no calf tenderness or swelling    SKIN: Normal color for age and race; warm; scattered urticaria NEURO: Moves all extremities equally PSYCH: The patient's mood and manner are appropriate. Grooming and personal hygiene are appropriate.  MEDICAL DECISION MAKING: Patient here with allergic reaction.  Will give IV Pepcid and monitor her in the ED to ensure there is no return of symptoms.  She is well-appearing at this time with only mild scattered urticaria.  No respiratory distress or hypertension.  I do not feel she needs epinephrine currently.  ED PROGRESS: Patient has had no return of symptoms.  Will discharge home.  Will discharge with steroid burst and Pepcid.  Have advised her to use Benadryl as needed.  Have advised her to avoid these methods in the future.  She will follow-up with her PCP.   At this time, I do not feel there is any life-threatening condition present. I have reviewed and discussed all results (EKG, imaging, lab, urine as appropriate) and exam findings with patient/family. I have reviewed nursing notes and appropriate previous records.  I feel the patient is safe to be discharged home without further emergent workup and can continue workup as an outpatient as needed. Discussed usual and customary return precautions. Patient/family verbalize understanding and are comfortable with this plan.  Outpatient follow-up has been provided if needed. All questions have been answered.      EKG Interpretation  Date/Time:  Tuesday October 27 2017 01:23:22 EST Ventricular Rate:  79 PR Interval:    QRS Duration: 97 QT Interval:  417 QTC Calculation: 478 R Axis:   2 Text  Interpretation:  Sinus rhythm Borderline low voltage, extremity leads Confirmed by Rochele Raring [phone]) on 10/27/2017 1:29:22 AM         Ward, Layla Maw, DO 10/27/17 0236

## 2017-10-27 NOTE — Discharge Instructions (Signed)
You may take Benadryl 50 mg every 8 hours as needed for hives, lip or tongue swelling, shortness of breath or any other sign of allergic reaction.

## 2017-10-27 NOTE — ED Triage Notes (Signed)
[facility] is [facility]. Patient reports she ate a blue berry muffin around 22:00-22:30, started developing shortness of breath and chest tightness. Also, had rash with small hives to upper body. Patient took one puff from daughters Ventolin. EMS administered BENADRYL 50mg , SOLU-MEDROL 125mg , and ALBUTEROL 5mg .

## 2017-10-27 NOTE — ED Notes (Signed)
Bed: [id number] Expected date:  Expected time:  Means of arrival:  Comments: EMS 77 yo female allergic reaction/wheezing and hives po benadryl/solumedrol-albuterol neb

## 2017-10-28 DIAGNOSIS — E538 Deficiency of other specified B group vitamins: Secondary | ICD-10-CM | POA: Diagnosis not present

## 2017-11-01 ENCOUNTER — Encounter (HOSPITAL_COMMUNITY): Payer: Self-pay | Admitting: Emergency Medicine

## 2017-11-01 ENCOUNTER — Inpatient Hospital Stay (HOSPITAL_COMMUNITY)
Admission: EM | Admit: 2017-11-01 | Discharge: 2017-11-05 | DRG: 041 | Disposition: A | Discharge status: Skilled Nursing Facility | Payer: PPO | Attending: Internal Medicine | Admitting: Internal Medicine

## 2017-11-01 ENCOUNTER — Emergency Department (HOSPITAL_COMMUNITY): Payer: PPO

## 2017-11-01 ENCOUNTER — Other Ambulatory Visit: Payer: Self-pay

## 2017-11-01 DIAGNOSIS — G8191 Hemiplegia, unspecified affecting right dominant side: Secondary | ICD-10-CM | POA: Diagnosis present

## 2017-11-01 DIAGNOSIS — Z7984 Long term (current) use of oral hypoglycemic drugs: Secondary | ICD-10-CM | POA: Diagnosis not present

## 2017-11-01 DIAGNOSIS — H547 Unspecified visual loss: Secondary | ICD-10-CM | POA: Diagnosis present

## 2017-11-01 DIAGNOSIS — Z9109 Other allergy status, other than to drugs and biological substances: Secondary | ICD-10-CM

## 2017-11-01 DIAGNOSIS — I639 Cerebral infarction, unspecified: Secondary | ICD-10-CM | POA: Diagnosis present

## 2017-11-01 DIAGNOSIS — Z882 Allergy status to sulfonamides status: Secondary | ICD-10-CM

## 2017-11-01 DIAGNOSIS — I4729 Other ventricular tachycardia: Secondary | ICD-10-CM

## 2017-11-01 DIAGNOSIS — E871 Hypo-osmolality and hyponatremia: Secondary | ICD-10-CM

## 2017-11-01 DIAGNOSIS — Q211 Atrial septal defect: Secondary | ICD-10-CM

## 2017-11-01 DIAGNOSIS — R29701 NIHSS score 1: Secondary | ICD-10-CM | POA: Diagnosis not present

## 2017-11-01 DIAGNOSIS — I6359 Cerebral infarction due to unspecified occlusion or stenosis of other cerebral artery: Principal | ICD-10-CM | POA: Diagnosis present

## 2017-11-01 DIAGNOSIS — Z8679 Personal history of other diseases of the circulatory system: Secondary | ICD-10-CM | POA: Diagnosis not present

## 2017-11-01 DIAGNOSIS — E559 Vitamin D deficiency, unspecified: Secondary | ICD-10-CM | POA: Diagnosis not present

## 2017-11-01 DIAGNOSIS — H8110 Benign paroxysmal vertigo, unspecified ear: Secondary | ICD-10-CM | POA: Diagnosis present

## 2017-11-01 DIAGNOSIS — I6389 Other cerebral infarction: Secondary | ICD-10-CM | POA: Diagnosis not present

## 2017-11-01 DIAGNOSIS — E119 Type 2 diabetes mellitus without complications: Secondary | ICD-10-CM | POA: Diagnosis not present

## 2017-11-01 DIAGNOSIS — E669 Obesity, unspecified: Secondary | ICD-10-CM | POA: Diagnosis not present

## 2017-11-01 DIAGNOSIS — R233 Spontaneous ecchymoses: Secondary | ICD-10-CM | POA: Diagnosis not present

## 2017-11-01 DIAGNOSIS — Z791 Long term (current) use of non-steroidal anti-inflammatories (NSAID): Secondary | ICD-10-CM

## 2017-11-01 DIAGNOSIS — K219 Gastro-esophageal reflux disease without esophagitis: Secondary | ICD-10-CM | POA: Diagnosis not present

## 2017-11-01 DIAGNOSIS — I472 Ventricular tachycardia, unspecified: Secondary | ICD-10-CM

## 2017-11-01 DIAGNOSIS — M1711 Unilateral primary osteoarthritis, right knee: Secondary | ICD-10-CM | POA: Diagnosis not present

## 2017-11-01 DIAGNOSIS — E785 Hyperlipidemia, unspecified: Secondary | ICD-10-CM | POA: Diagnosis present

## 2017-11-01 DIAGNOSIS — R4182 Altered mental status, unspecified: Secondary | ICD-10-CM | POA: Diagnosis not present

## 2017-11-01 DIAGNOSIS — M6281 Muscle weakness (generalized): Secondary | ICD-10-CM | POA: Diagnosis not present

## 2017-11-01 DIAGNOSIS — M25362 Other instability, left knee: Secondary | ICD-10-CM | POA: Diagnosis present

## 2017-11-01 DIAGNOSIS — R260 Ataxic gait: Secondary | ICD-10-CM | POA: Diagnosis present

## 2017-11-01 DIAGNOSIS — Z8249 Family history of ischemic heart disease and other diseases of the circulatory system: Secondary | ICD-10-CM

## 2017-11-01 DIAGNOSIS — Z7982 Long term (current) use of aspirin: Secondary | ICD-10-CM | POA: Diagnosis not present

## 2017-11-01 DIAGNOSIS — R471 Dysarthria and anarthria: Secondary | ICD-10-CM | POA: Diagnosis not present

## 2017-11-01 DIAGNOSIS — I69398 Other sequelae of cerebral infarction: Secondary | ICD-10-CM | POA: Diagnosis not present

## 2017-11-01 DIAGNOSIS — R42 Dizziness and giddiness: Secondary | ICD-10-CM

## 2017-11-01 DIAGNOSIS — E118 Type 2 diabetes mellitus with unspecified complications: Secondary | ICD-10-CM

## 2017-11-01 DIAGNOSIS — Z87891 Personal history of nicotine dependence: Secondary | ICD-10-CM

## 2017-11-01 DIAGNOSIS — D72829 Elevated white blood cell count, unspecified: Secondary | ICD-10-CM | POA: Diagnosis not present

## 2017-11-01 DIAGNOSIS — E538 Deficiency of other specified B group vitamins: Secondary | ICD-10-CM | POA: Diagnosis not present

## 2017-11-01 DIAGNOSIS — W06XXXA Fall from bed, initial encounter: Secondary | ICD-10-CM | POA: Diagnosis present

## 2017-11-01 DIAGNOSIS — Y92009 Unspecified place in unspecified non-institutional (private) residence as the place of occurrence of the external cause: Secondary | ICD-10-CM | POA: Diagnosis not present

## 2017-11-01 DIAGNOSIS — I340 Nonrheumatic mitral (valve) insufficiency: Secondary | ICD-10-CM | POA: Diagnosis not present

## 2017-11-01 DIAGNOSIS — I351 Nonrheumatic aortic (valve) insufficiency: Secondary | ICD-10-CM | POA: Diagnosis not present

## 2017-11-01 DIAGNOSIS — Z6835 Body mass index (BMI) 35.0-35.9, adult: Secondary | ICD-10-CM | POA: Diagnosis not present

## 2017-11-01 DIAGNOSIS — R404 Transient alteration of awareness: Secondary | ICD-10-CM | POA: Diagnosis not present

## 2017-11-01 DIAGNOSIS — I6300 Cerebral infarction due to thrombosis of unspecified precerebral artery: Secondary | ICD-10-CM | POA: Diagnosis not present

## 2017-11-01 DIAGNOSIS — E1151 Type 2 diabetes mellitus with diabetic peripheral angiopathy without gangrene: Secondary | ICD-10-CM | POA: Diagnosis not present

## 2017-11-01 DIAGNOSIS — Z01810 Encounter for preprocedural cardiovascular examination: Secondary | ICD-10-CM | POA: Diagnosis not present

## 2017-11-01 DIAGNOSIS — S0990XA Unspecified injury of head, initial encounter: Secondary | ICD-10-CM | POA: Diagnosis not present

## 2017-11-01 DIAGNOSIS — R29703 NIHSS score 3: Secondary | ICD-10-CM | POA: Diagnosis not present

## 2017-11-01 DIAGNOSIS — Z79899 Other long term (current) drug therapy: Secondary | ICD-10-CM

## 2017-11-01 DIAGNOSIS — I10 Essential (primary) hypertension: Secondary | ICD-10-CM | POA: Diagnosis not present

## 2017-11-01 DIAGNOSIS — I6521 Occlusion and stenosis of right carotid artery: Secondary | ICD-10-CM | POA: Diagnosis not present

## 2017-11-01 DIAGNOSIS — Z7952 Long term (current) use of systemic steroids: Secondary | ICD-10-CM

## 2017-11-01 HISTORY — DX: Unilateral primary osteoarthritis, unspecified knee: M17.10

## 2017-11-01 HISTORY — DX: Osteoarthritis of knee, unspecified: M17.9

## 2017-11-01 HISTORY — DX: Deficiency of other specified B group vitamins: E53.8

## 2017-11-01 LAB — URINALYSIS, ROUTINE W REFLEX MICROSCOPIC
Bacteria, UA: NONE SEEN
Bilirubin Urine: NEGATIVE
Glucose, UA: >=500 mg/dL — AB
Hgb urine dipstick: NEGATIVE
Ketones, ur: 5 mg/dL — AB
Leukocytes, UA: NEGATIVE
Nitrite: NEGATIVE
Protein, ur: NEGATIVE mg/dL
Specific Gravity, Urine: 1.012 (ref 1.005–1.030)
Squamous Epithelial / LPF: NONE SEEN
pH: 7 (ref 5.0–8.0)

## 2017-11-01 LAB — CBC WITH DIFFERENTIAL/PLATELET
Basophils Absolute: 0 10*3/uL (ref 0.0–0.1)
Basophils Relative: 0 %
Eosinophils Absolute: 0 10*3/uL (ref 0.0–0.7)
Eosinophils Relative: 0 %
HCT: 43.3 % (ref 36.0–46.0)
Hemoglobin: 14.4 g/dL (ref 12.0–15.0)
Lymphocytes Relative: 22 %
Lymphs Abs: 3.1 10*3/uL (ref 0.7–4.0)
MCH: 29.7 pg (ref 26.0–34.0)
MCHC: 33.3 g/dL (ref 30.0–36.0)
MCV: 89.3 fL (ref 78.0–100.0)
Monocytes Absolute: 0.5 10*3/uL (ref 0.1–1.0)
Monocytes Relative: 4 %
Neutro Abs: 10.8 10*3/uL — ABNORMAL HIGH (ref 1.7–7.7)
Neutrophils Relative %: 74 %
Platelets: 236 10*3/uL (ref 150–400)
RBC: 4.85 MIL/uL (ref 3.87–5.11)
RDW: 13.6 % (ref 11.5–15.5)
WBC: 14.5 10*3/uL — ABNORMAL HIGH (ref 4.0–10.5)

## 2017-11-01 LAB — BASIC METABOLIC PANEL
Anion gap: 17 — ABNORMAL HIGH (ref 5–15)
BUN: 21 mg/dL — ABNORMAL HIGH (ref 6–20)
CO2: 23 mmol/L (ref 22–32)
Calcium: 9.2 mg/dL (ref 8.9–10.3)
Chloride: 94 mmol/L — ABNORMAL LOW (ref 101–111)
Creatinine, Ser: 0.71 mg/dL (ref 0.44–1.00)
GFR calc Af Amer: >60 mL/min (ref >60)
GFR calc non Af Amer: >60 mL/min (ref >60)
Glucose, Bld: 339 mg/dL — ABNORMAL HIGH (ref 65–99)
Potassium: 3.9 mmol/L (ref 3.5–5.1)
Sodium: 134 mmol/L — ABNORMAL LOW (ref 135–145)

## 2017-11-01 LAB — GLUCOSE, CAPILLARY: GLUCOSE-CAPILLARY: 141 mg/dL — AB (ref 65–99)

## 2017-11-01 MED ORDER — SODIUM CHLORIDE 0.9 % IV SOLN
INTRAVENOUS | Status: DC
  Administered 2017-11-01 (×2): via INTRAVENOUS

## 2017-11-01 MED ORDER — ASPIRIN EC 81 MG PO TBEC: 81.0000 mg | DELAYED_RELEASE_TABLET | Freq: Every day | ORAL | Status: DC

## 2017-11-01 MED ORDER — INSULIN GLARGINE 100 UNIT/ML ~~LOC~~ SOLN
8.0000 [IU] | Freq: Every day | SUBCUTANEOUS | Status: DC
  Administered 2017-11-01: 8 [IU] via SUBCUTANEOUS
  Filled 2017-11-01 (×3): qty 0.08

## 2017-11-01 MED ORDER — PROMETHAZINE HCL 25 MG/ML IJ SOLN
12.5000 mg | Freq: Once | INTRAMUSCULAR | Status: AC
  Administered 2017-11-01: 12.5 mg via INTRAVENOUS
  Filled 2017-11-01: qty 1

## 2017-11-01 MED ORDER — ATORVASTATIN CALCIUM 40 MG PO TABS
40.0000 mg | ORAL_TABLET | Freq: Every day | ORAL | Status: DC
  Administered 2017-11-01 – 2017-11-04 (×4): 40 mg via ORAL
  Filled 2017-11-01 (×4): qty 1

## 2017-11-01 MED ORDER — LORAZEPAM 2 MG/ML IJ SOLN
0.5000 mg | Freq: Once | INTRAMUSCULAR | Status: AC
  Administered 2017-11-01: 0.5 mg via INTRAVENOUS
  Filled 2017-11-01: qty 1

## 2017-11-01 NOTE — ED Notes (Signed)
ED Provider at bedside. 

## 2017-11-01 NOTE — ED Triage Notes (Signed)
Patient presents the ED from home. Per EMS patient found on the floor by family. Patient reports felt dizzy and fell. Patient denies any LOC and hitting her head. EMS reports family reports patient has slurred speech. Patient reports she does not feel like her speech sounds the same. Patient has vomited x3. Patient given Zofran. Patient denies being on blood thinner. Patient alert and oriented on arrival. Patient able to follow commands.

## 2017-11-01 NOTE — ED Notes (Signed)
Neurologist at bedside. 

## 2017-11-01 NOTE — H&P (Addendum)
History and Physical  Patient Name: Yvonne Newton     ZOX:[id number]    DOB: Apr 04, 1941    DOA: 11/01/2017 PCP: Jerl Mina, MD  Cardiologist: Dr. Ladona Ridgel   Patient coming from: Home  Chief Complaint: Ataxia, vertigo      HPI: Yvonne Newton is a 77 y.o. female with a past medical history significant for NIDDM, HTN, and ventricular tachycardias on flecainide who presents with ataxia, vertigo.  All history collected from EDP and family by phone, as the patient is sedated by Lorazepam and promethazine.  Evidently, the patient was in her usual state of health until this morning, she awoke with vertigo, difficulty walking.  Her symptoms persisted for over an hour and so she came to the emergency room.  ED course: -Afebrile, heart rate 58, respirations and pulse ox normal, blood pressure 155/68 -Na 134, K 0.9, Cr 0.71 (baseline same), WBC 14.8 K, Hgb 14.4 -CT head unremarkable -ECG normal sinus rhythm -The patient was given lorazepam and Phenergan and had no relief of her symptoms, so MRI brain was obtained -MRI of the brain showed a left subcentimeter cerebellar infarct    ROS: Review of Systems  Unable to perform ROS: Other (sedation from medications)          Past Medical History:  Diagnosis Date  . DM (diabetes mellitus) (HCC)   . Obesity   . Ventricular tachycardia [facility])     Past Surgical History:  Procedure Laterality Date  . BREAST BIOPSY Left 2010   Negative    Social History: Patient lives with her daughter.  The patient walks unassisted.  Nonsmoker.  Allergies  Allergen Reactions  . Sulfa Antibiotics Itching  . Sulfonamide Derivatives Itching  . Other Rash and Swelling    METAL    Family history: family history includes Heart attack in her brother; Heart attack (age of onset: 51) in her brother; Heart attack (age of onset: 43) in her father.  Prior to Admission medications   Medication Sig Start Date End Date Taking? Authorizing  Provider  amLODipine (NORVASC) 2.5 MG tablet Take 2.5 mg by mouth 2 (two) times daily. 02/26/17   [provider]  famotidine (PEPCID) 20 MG tablet Take 1 tablet (20 mg total) by mouth 2 (two) times daily. 10/27/17   Ward, Layla Maw, DO  flecainide (TAMBOCOR) 50 MG tablet TAKE 1 TABLET (50 MG TOTAL) BY MOUTH 2 (TWO) TIMES DAILY. 02/05/12   Marinus Maw, MD  meloxicam (MOBIC) 15 MG tablet Take 15 mg by mouth daily.    [provider]  metFORMIN (GLUCOPHAGE-XR) 500 MG 24 hr tablet Take 1 tablet by mouth in the morning and 2 tablets in the evening 01/25/16   [provider]  metoprolol tartrate (LOPRESSOR) 50 MG tablet Take 50 mg by mouth 2 (two) times daily. 04/27/17   [provider]  omeprazole (PRILOSEC) 20 MG capsule Take 20 mg by mouth daily. prn     [provider]  predniSONE (DELTASONE) 20 MG tablet Take 3 tablets (60 mg total) by mouth daily. 10/27/17   Ward, Layla Maw, DO  traMADol (ULTRAM) 50 MG tablet Take 50 mg by mouth every 6 (six) hours as needed (pain).     [provider]       Physical Exam: BP (!) 133/55   Pulse 63   Temp 97.8 F (36.6 C) (Oral)   Resp (!) 21   SpO2 97%  General appearance: Well-developed, obese adult female, sleepy, sedated, rouses  to voice and noxious stimuli, groans yes or no to questions.  Eyes: Anicteric, conjunctiva pink, lids and lashes normal. PERRL.    ENT: No nasal deformity, discharge, epistaxis.  Hearing seems normal. OP moist without lesions.  Edentulous. Neck: No neck masses.  Trachea midline.  No thyromegaly/tenderness. Lymph: No cervical or supraclavicular lymphadenopathy. Skin: Warm and dry.  No jaundice.  No suspicious rashes or lesions. Some dilated leg veins. Cardiac: RRR, nl S1-S2, no murmurs appreciated.  Capillary refill is brisk.  JVP not visible.  No LE edema.  Radial pulses 2+ and symmetric. Respiratory: Normal respiratory rate and rhythm.  CTAB without rales or  wheezes. Abdomen: Abdomen soft.  No TTP. No ascites, distension, hepatosplenomegaly.   MSK: No deformities or effusions.  No cyanosis or clubbing. Neuro: Cranial nerves 7-12 tested and normal, 3-6 unable to test due to patient mentation.   Muscle strength 5/5 in upper extremities, does not follow commands to move legs.  Coordination seems off in left arm in attempting to touch her nose or my finger, although testing limited by Phenergan.    Psych: Unable to assess.     Labs on Admission:  I have personally reviewed following labs and imaging studies: CBC: Recent Labs  Lab 11/01/17 0917  WBC 14.5*  NEUTROABS 10.8*  HGB 14.4  HCT 43.3  MCV 89.3  PLT 236   Basic Metabolic Panel: Recent Labs  Lab 11/01/17 0917  NA 134*  K 3.9  CL 94*  CO2 23  GLUCOSE 339*  BUN 21*  CREATININE 0.71  CALCIUM 9.2   GFR: CrCl cannot be calculated (Unknown ideal weight.).  Liver Function Tests: No results for input(s): AST, ALT, ALKPHOS, BILITOT, PROT, ALBUMIN in the last 168 hours. No results for input(s): LIPASE, AMYLASE in the last 168 hours. No results for input(s): AMMONIA in the last 168 hours. Coagulation Profile: No results for input(s): INR, PROTIME in the last 168 hours. Cardiac Enzymes: No results for input(s): CKTOTAL, CKMB, CKMBINDEX, TROPONINI in the last 168 hours. BNP (last 3 results) No results for input(s): PROBNP in the last 8760 hours. HbA1C: No results for input(s): HGBA1C in the last 72 hours. CBG: No results for input(s): GLUCAP in the last 168 hours. Lipid Profile: No results for input(s): CHOL, HDL, LDLCALC, TRIG, CHOLHDL, LDLDIRECT in the last 72 hours. Thyroid Function Tests: No results for input(s): TSH, T4TOTAL, FREET4, T3FREE, THYROIDAB in the last 72 hours. Anemia Panel: No results for input(s): VITAMINB12, FOLATE, FERRITIN, TIBC, IRON, RETICCTPCT in the last 72 hours. Sepsis Labs: Invalid input(s): PROCALCITONIN, LACTICACIDVEN No results found for this  or any previous visit (from the past 240 hour(s)).       Radiological Exams on Admission: Personally reviewed CT and MR head reports: Ct Head Wo Contrast  Result Date: 11/01/2017 CLINICAL DATA:  Dizzy, status post fall, slurred speech EXAM: CT HEAD WITHOUT CONTRAST TECHNIQUE: Contiguous axial images were obtained from the base of the skull through the vertex without intravenous contrast. COMPARISON:  None. FINDINGS: Brain: No evidence of acute infarction, hemorrhage, extra-axial collection, ventriculomegaly, or mass effect. Generalized cerebral atrophy. Periventricular white matter low attenuation likely secondary to microangiopathy. Vascular: Cerebrovascular atherosclerotic calcifications are noted. Skull: Negative for fracture or focal lesion. Sinuses/Orbits: Visualized portions of the orbits are unremarkable. Mastoid sinuses are clear. Left maxillary sinus mucosal thickening. Other: None. IMPRESSION: No acute intracranial pathology. Electronically Signed   By: Elige Ko   On: 11/01/2017 11:00   Mr Brain Wo Contrast  Result Date:  11/01/2017 CLINICAL DATA:  Initial evaluation for acute altered mental status. EXAM: MRI HEAD WITHOUT CONTRAST TECHNIQUE: Multiplanar, multiecho pulse sequences of the brain and surrounding structures were obtained without intravenous contrast. COMPARISON:  Comparison made with prior CT from earlier the same day. FINDINGS: Brain: Generalized age appropriate cerebral atrophy. Patchy and confluent T2/FLAIR hyperintensity within the periventricular and deep white matter both cerebral hemispheres, most consistent with chronic small vessel ischemic disease, mild in nature. Patchy small volume abnormal restricted diffusion seen involving the superior left cerebellar hemisphere, consistent with acute ischemic infarct (series 3, image 17). Finding involves the left superior cerebellar artery territory. Involvement of the left cerebellar vermis. No significant mass effect. Associated  small volume petechial hemorrhage without hemorrhagic transformation (series 9, image 25). No other evidence for acute or subacute ischemia. No encephalomalacia to suggest chronic cortical infarction. No other evidence for acute or chronic intracranial hemorrhage. No mass lesion, midline shift or mass effect. No hydrocephalus. No extra-axial fluid collection. Major dural sinuses are grossly patent. Pituitary gland suprasellar region normal. Midline structures intact and normal. Vascular: Major intracranial vascular flow voids are maintained at the skull base. Skull and upper cervical spine: Craniocervical junction normal. Upper cervical spine normal. Bone marrow signal intensity within normal limits. Mild hyperostosis frontalis interna noted. No scalp soft tissue abnormality. Sinuses/Orbits: Globes and orbital soft tissues within normal limits. Axial myopia noted. Mild scattered mucosal thickening within the ethmoidal air cells and maxillary sinuses. Paranasal sinuses are otherwise clear. No mastoid effusion. Inner ear structures normal. Other: None IMPRESSION: 1. 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 Associated small volume petechial hemorrhage without hemorrhagic frank transformation. No significant mass effect. 2. No other acute intracranial abnormality. 3. Mild for age chronic small vessel ischemic disease. Electronically Signed   By: Rise MuBenjamin  McClintock M.D.   On: 11/01/2017 16:45    EKG: Independently reviewed. Rate 59, QTc normal, no ST changes.    Assessment/Plan Principal Problem:   Stroke (cerebrum) (HCC) Active Problems:   VENTRICULAR TACHYCARDIA   Diabetes mellitus type 2, uncomplicated (HCC)   Essential hypertension  1. Acute Stroke:  This is new.  MRI shows small left cerebellar stroke, microhemhorrage.   -Admit to telemetry -Neuro checks, NIHSS per protocol -Daily aspirin 325 mg -Start statin -Permissive  hypertension for now -Lipids, hemoglobin A1c -Carotid doppler, ordered -Echocardiogram ordered -PT/OT/SLP consultation -Consult to Neurology, appreciate recommendations   2. History of ventricular tachycardia:  -Continue flecainide, metoprolol  3. HTN:  -Permissive hypertension for now, hold amlodipine  4. Diabetes:  -Hold metformin -SSI with meals -Lantus 8 units nightly     DVT prophylaxis: SCDs  Code Status: FULL  Family Communication: Son by phone  Disposition Plan: Anticipate Stroke work up as above and consult to ancillary services.  Expect discharge within 2days. Consults called: Neurology Admission status: Telemetry, INPATIENT status  Core measures: -VTE prophylaxis ordered at time of admission -Aspirin ordered at admission -Atrial fibrillation: not previously known or present -tPA not given because of outside window -Dysphagia screen ordered in ER -Lipids ordered -PT eval ordered -Nonsmoker       Medical decision making: Patient seen at 5:48 PM on 11/01/2017.  The patient was discussed with Dr. Denton LankSteinl.  What exists of the patient's chart was reviewed in depth and summarized above.  Clinical condition: stable.        Alberteen SamChristopher P Pearlean Sabina [facility] Pager [phone]

## 2017-11-01 NOTE — Consult Note (Addendum)
NEUROHOSPITALISTS - CONSULTATION NOTE   Requesting Physician: Dr. Cathren Laine [facility]: Dr. Laurence Slate  Admit date: 11/01/2017    Chief Complaint: Dizziness, ataxia and dysarthria  History obtained from:  Patient and Chart and family at bedside  HPI   Yvonne Newton is an 77 y.o. 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 Daughters at bedside say the patients symptoms started at approximately 6:15 AM and worsened throughout the day. Per her daughters she fell at home today onto her left side.  MRI Head reveals: Acute Left cerebral artery infarct. Patient takes no ASA or statin at home. She is compliant with all of her other medications.  Patient states she has had a "nagging" left side H/A for the past few weeks with associated "eye stain". The patient also admits to very poor sleep (less than 4 hours per night) for the past month. She denies any H/A or visual changes on exam today.  Date last known well: Date: 11/01/2017 Time last known well: Time: 06:15 tPA Given: No: outside the window Modified Rankin: Rankin Score=0 NIHSS: 1  Past Medical History: She  has a past medical history of DM (diabetes mellitus) (HCC), Obesity, and Ventricular tachycardia (HCC).  Past Surgical History: She  has a past surgical history that includes Breast biopsy (Left, 2010).  Family History:  indicated that the status of her father is unknown. She indicated that the status of her neg hx is unknown.  Social History:  She  reports that she quit smoking about 19 years ago. she has never used smokeless tobacco. She reports that she does not drink alcohol or use drugs.  Allergies:  Allergies  Allergen Reactions  . Blueberry Flavor Hives and Shortness Of Breath  . Sulfa Antibiotics Itching  . Sulfonamide Derivatives Itching  . Other Swelling and Rash    METAL    Medications:  Current Facility-Administered Medications:  .  0.9 %  sodium chloride infusion, , Intravenous, Continuous, Raeford Razor, MD, Last Rate: 100 mL/hr at 11/01/17 0925 .  aspirin EC tablet 81 mg, 81 mg, Oral, Daily, Costello, Mary A, NP .  atorvastatin (LIPITOR) tablet 40 mg, 40 mg, Oral, q1800, Danford, Christopher P, MD .  insulin glargine (LANTUS) injection 8 Units, 8 Units, Subcutaneous, QHS, Danford, Earl Lites, MD  Current Outpatient Medications:  .  amLODipine (NORVASC) 2.5 MG tablet, Take 2.5 mg by mouth 2 (two) times daily., Disp: , Rfl:  .  CYANOCOBALAMIN IJ, Inject as directed every 30 (thirty) days. On or about the 26th of each month, Disp: , Rfl:  .  EPINEPHrine (EPIPEN 2-PAK) 0.3 mg/0.3 mL IJ SOAJ injection, Inject 0.3 mg into the muscle once as needed (severe allergic reaction)., Disp: , Rfl:  .  famotidine (PEPCID) 20 MG tablet, Take 1 tablet (20 mg total) by mouth 2 (two) times daily., Disp: 10 tablet, Rfl: 0 .  flecainide (TAMBOCOR) 50 MG tablet, TAKE 1 TABLET (50 MG TOTAL) BY MOUTH 2 (TWO) TIMES DAILY., Disp: 60 tablet, Rfl: 2 .  meloxicam (MOBIC) 15 MG tablet, Take 15 mg by mouth daily., Disp: , Rfl:  .  metFORMIN (GLUCOPHAGE-XR) 500 MG 24 hr tablet, Take 500-1,000 mg by mouth See admin instructions. Take one tablet (500 mg) by mouth every morning and two tablets (1000 mg) at bedtime, Disp: , Rfl:  .  omeprazole (PRILOSEC) 20 MG capsule, Take 20 mg by mouth daily. , Disp: ,  Rfl:  .  predniSONE (DELTASONE) 20 MG tablet, Take 3 tablets (60 mg total) by mouth daily. (Patient taking differently: Take 20-40 mg by mouth See admin instructions. Started 10/27/17  - 5 day course: take 2 tablets (40 mg) by mouth every morning and 1 tablet (20 mg) at night), Disp: 15 tablet, Rfl: 0 .  Vitamin D, Ergocalciferol, (DRISDOL) 50000 units CAPS capsule, Take 50,000 Units by mouth every 7 (seven) days., Disp: , Rfl:  .  metoprolol tartrate (LOPRESSOR) 50 MG tablet, Take 50 mg by  mouth 2 (two) times daily., Disp: , Rfl:   ROS: History obtained from the patient  General ROS: negative for - chills, fatigue, fever, night sweats, weight gain or weight loss Psychological ROS: negative for - behavioral disorder, hallucinations, memory difficulties, mood swings or suicidal ideation Ophthalmic ROS: negative for - blurry vision, double vision, eye pain or loss of vision ENT ROS: negative for - epistaxis, nasal discharge, oral lesions, sore throat, tinnitus or vertigo Allergy and Immunology ROS: negative for - hives or itchy/watery eyes Hematological and Lymphatic ROS: negative for - bleeding problems, bruising or swollen lymph nodes Endocrine ROS: negative for - galactorrhea, hair pattern changes, polydipsia/polyuria or temperature intolerance Respiratory ROS: negative for - cough, hemoptysis, shortness of breath or wheezing Cardiovascular ROS: negative for - chest pain, dyspnea on exertion, edema or irregular heartbeat Gastrointestinal ROS: negative for - abdominal pain, diarrhea, hematemesis, nausea/vomiting or stool incontinence Genito-Urinary ROS: negative for - dysuria, hematuria, incontinence or urinary frequency/urgency Musculoskeletal ROS: negative for - joint swelling or muscular weakness Neurological ROS: as noted in HPI Dermatological ROS: negative for rash and skin lesion changes  Physical Examination: Vitals:   11/01/17 1700 11/01/17 1730 11/01/17 1731 11/01/17 1736  BP: (!) 159/57 (!) 133/55    Pulse: (!) 59 (!) 57 (!) 57 63  Resp: 15 14 16  (!) 21  Temp:      TempSrc:      SpO2: 94% 92% 93% 97%   HEENT-  Normocephalic,    Cardiovascular - Regular rate and rhythm  Respiratory - Lungs clear bilaterally. Non-labored breathing, No wheezing. Abdomen - soft and non-tender, BS normal Extremities- no edema or cyanosis Skin-warm and dry  Neurological Examination Mental Status: Alert, oriented, thought content appropriate.  Speech fluent without evidence of  aphasia. Mildly dysarthria but understandable.  Able to follow 3 step commands without difficulty. Cranial Nerves: II: Visual Fields are full. Pupils are equal, round, and reactive to light.   III,IV, VI: EOMI without ptosis or diploplia. No nystagmus.   V: Facial sensation is symmetric to temperature VII: Facial movement is symmetric.  VIII: hearing is intact to voice X: Uvula elevates symmetrically XI: Shoulder shrug is symmetric. XII: tongue is midline without atrophy or fasciculations.  Motor: Tone is normal. Bulk is normal.  5/5 strength was present in all four extremities. Left arm slightly weaker than Right but likely due pain from a fall this afternoon at home onto this arm Sensor: Sensation is symmetric to light touch and temperature in the arms and legs. Deep Tendon Reflexes: 2+ and symmetric throughout in the biceps and patellae Plantars: Toes are downgoing bilaterally.  Cerebellar: slightly ataxic on left with finger-to-nose,  Gait: not tested, + truncal ataxia    Lab Results: CBC: Recent Labs  Lab 11/01/17 0917  WBC 14.5*  HGB 14.4  HCT 43.3  MCV 89.3  PLT 236   Basic Metabolic Panel: Recent Labs  Lab 11/01/17 0917  NA 134*  K 3.9  CL 94*  CO2 23  GLUCOSE 339*  BUN 21*  CREATININE 0.71  CALCIUM 9.2   Urinalysis:  Recent Labs  Lab 11/01/17 1215  COLORURINE STRAW*  APPEARANCEUR CLEAR  LABSPEC 1.012  PHURINE 7.0  GLUCOSEU >=500*  HGBUR NEGATIVE  BILIRUBINUR NEGATIVE  KETONESUR 5*  PROTEINUR NEGATIVE  NITRITE NEGATIVE  LEUKOCYTESUR NEGATIVE   Imaging: Ct Head Wo Contrast  Result Date: 11/01/2017 CLINICAL DATA:  Dizzy, status post fall, slurred speech EXAM: CT HEAD WITHOUT CONTRAST TECHNIQUE: Contiguous axial images were obtained from the base of the skull through the vertex without intravenous contrast. COMPARISON:  None. FINDINGS: Brain: No evidence of acute infarction, hemorrhage, extra-axial collection, ventriculomegaly, or mass effect.  Generalized cerebral atrophy. Periventricular white matter low attenuation likely secondary to microangiopathy. Vascular: Cerebrovascular atherosclerotic calcifications are noted. Skull: Negative for fracture or focal lesion. Sinuses/Orbits: Visualized portions of the orbits are unremarkable. Mastoid sinuses are clear. Left maxillary sinus mucosal thickening. Other: None. IMPRESSION: No acute intracranial pathology. Electronically Signed   By: Elige KoHetal  Patel   On: 11/01/2017 11:00   Mr Brain Wo Contrast  Result Date: 11/01/2017 CLINICAL DATA:  Initial evaluation for acute altered mental status. EXAM: MRI HEAD WITHOUT CONTRAST TECHNIQUE: Multiplanar, multiecho pulse sequences of the brain and surrounding structures were obtained without intravenous contrast. COMPARISON:  Comparison made with prior CT from earlier the same day. FINDINGS: Brain: Generalized age appropriate cerebral atrophy. Patchy and confluent T2/FLAIR hyperintensity within the periventricular and deep white matter both cerebral hemispheres, most consistent with chronic small vessel ischemic disease, mild in nature. Patchy small volume abnormal restricted diffusion seen involving the superior left cerebellar hemisphere, consistent with acute ischemic infarct (series 3, image 17). Finding involves the left superior cerebellar artery territory. Involvement of the left cerebellar vermis. No significant mass effect. Associated small volume petechial hemorrhage without hemorrhagic transformation (series 9, image 25). No other evidence for acute or subacute ischemia. No encephalomalacia to suggest chronic cortical infarction. No other evidence for acute or chronic intracranial hemorrhage. No mass lesion, midline shift or mass effect. No hydrocephalus. No extra-axial fluid collection. Major dural sinuses are grossly patent. Pituitary gland suprasellar region normal. Midline structures intact and normal. Vascular: Major intracranial vascular flow voids are  maintained at the skull base. Skull and upper cervical spine: Craniocervical junction normal. Upper cervical spine normal. Bone marrow signal intensity within normal limits. Mild hyperostosis frontalis interna noted. No scalp soft tissue abnormality. Sinuses/Orbits: Globes and orbital soft tissues within normal limits. Axial myopia noted. Mild scattered mucosal thickening within the ethmoidal air cells and maxillary sinuses. Paranasal sinuses are otherwise clear. No mastoid effusion. Inner ear structures normal. Other: None IMPRESSION: 1. 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 Associated small volume petechial hemorrhage without hemorrhagic frank transformation. No significant mass effect. 2. No other acute intracranial abnormality. 3. Mild for age chronic small vessel ischemic disease. Electronically Signed   By: Rise MuBenjamin  McClintock M.D.   On: 11/01/2017 16:45     IMPRESSION: Yvonne Newton is a 77 y.o. 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 Tachycardia  Outstanding Stroke Work-up Studies:     Echocardiogram:                                                    PENDING B/L Carotid U/S:                                                     PENDING  PLAN  11/01/2017: Patient will be admitted to the hospitalists and stroke workup will be obtained. Frequent Neurochecks  Telemetry Monitoring NPO until passes Stroke swallow screen Continue Aspirin/  Statin HgbA1C and Lipid Profile PT/OT/SLP Consult PM & Rehab Consult Case Management /MSW May need TEE and Loop Recorder Placement Ongoing aggressive stroke risk factor management Patient will be counseled to be compliant with her/his antithrombotic medications Patient will be counseled on Lifestyle modifications including, Diet, Exercise, and Stress Follow up with [facility] in 6 weeks  R/O AFIB: May need TEE and Loop Recorder Placement  HYPERTENSION: Stable Permissive hypertension (OK if <220/120) for 24-48 hours post stroke and then gradually normalized within 5-7 days. Long term BP goal normotensive. May slowly restart home B/P medications after 48 hours Home Meds: Multiple  HYPERLIPIDEMIA:  LIPID PANEL PENDING No results found for: CHOL, TRIG, HDL, CHOLHDL, VLDL, LDLCALC Home Meds:  NONE LDL  goal < 70 Started on  Lipitor to 40 mg daily Continue statin at discharge  DIABETES: No results found for: HGBA1C - HGA1C PENDING No results for input(s): GLUCAP in the last 168 hours. HgbA1c goal < 7.0 Continue CBG monitoring and SSI to maintain glucose 140-180 mg/dl DM education   Other Active Problems: Principal Problem:   Stroke (cerebrum) (HCC) Active Problems:   VENTRICULAR TACHYCARDIA   Diabetes mellitus type 2, uncomplicated (HCC)   Essential hypertension    Hospital day # 0 VTE prophylaxis:  SCD's  Diet - Diet NPO time specified Fall precautions   FAMILY UPDATES: family at bedside  TEAM UPDATES:Danford, Earl Lites, *   Prior Home Stroke Medications:  No antithrombotic  Discharge Stroke Meds:  Please discharge patient on aspirin 81 mg daily   Disposition: 01-Home or Self Care Therapy Recs:               PENDING Follow Up:  Jerl Mina, MD -PCP Follow up in 1-2 weeks      Assessment and plan discussed with with attending physician and they are in agreement.    Beryl Meager, ANP- [facility] 11/01/2017, 6:13  PM  11/01/2017 ATTENDING ASSESSMENT:    Small cerebellar stroke. Petechial hemorrhage minimal, ok to start ASA.   Seen and examined the patient today. Formulated plan as documented above by ARNP. I agree with recommendations as above.   Will follow.  Georgiana Spinner Adal Sereno MD [facility] [phone]  If 7pm to 7am, please call on call as listed on AMION.   Please page stroke NP  Or  PA  Or MD from 8am -4 pm  as this patient from this time will be  followed by the stroke.   You can look them up on [email or web address]  Password [email or web address]

## 2017-11-01 NOTE — ED Notes (Signed)
Patient transported to CT 

## 2017-11-01 NOTE — ED Provider Notes (Signed)
[facility] EMERGENCY DEPARTMENT Provider Note   CSN: [id number] Arrival date & time: 11/01/17  0906     History   Chief Complaint Chief Complaint  Patient presents with  . Nausea  . Headache    HPI Yvonne Newton is a 77 y.o. female.  HPI   77 year old female presenting after fall.  She was found this morning on the floor by her family after they heard a thud.  Patient was awake when they found her.  Patient reports that she felt dizzy as she was getting out of bed and fell.  She does not think she hit her head.  She has a significant pain.  Patient has been feeling extremely nauseated since this time.  Vomited multiple times.  Speech has been slurred. Family reporting that pt has had intermittent periods of confusion and slurred speech over the past several weeks including when she was treated for a presumed allergic reaction in the ED on 10/27/17.   Past Medical History:  Diagnosis Date  . DM (diabetes mellitus) (HCC)   . Obesity   . Ventricular tachycardia [facility])     Patient Active Problem List   Diagnosis Date Noted  . Allergic state 07/22/2017  . Arthritis 07/22/2017  . Diabetes mellitus type 2, uncomplicated (HCC) 07/22/2017  . Hyperlipidemia, unspecified 07/22/2017  . Hypertension 07/22/2017  . Tachycardia 07/22/2017  . Chest pressure 05/07/2011  . ESSENTIAL HYPERTENSION, BENIGN 09/07/2009  . DM 09/05/2009  . OBESITY 09/05/2009  . VENTRICULAR TACHYCARDIA 09/05/2009    Past Surgical History:  Procedure Laterality Date  . BREAST BIOPSY Left 2010   Negative    OB History    No data available       Home Medications    Prior to Admission medications   Medication Sig Start Date End Date Taking? Authorizing Provider  amLODipine (NORVASC) 2.5 MG tablet Take 2.5 mg by mouth 2 (two) times daily. 02/26/17   [provider]  famotidine (PEPCID) 20 MG tablet Take 1 tablet (20 mg total) by mouth 2 (two) times daily. 10/27/17    Ward, Layla Maw, DO  flecainide (TAMBOCOR) 50 MG tablet TAKE 1 TABLET (50 MG TOTAL) BY MOUTH 2 (TWO) TIMES DAILY. 02/05/12   Marinus Maw, MD  meloxicam (MOBIC) 15 MG tablet Take 15 mg by mouth daily.    [provider]  metFORMIN (GLUCOPHAGE-XR) 500 MG 24 hr tablet Take 1 tablet by mouth in the morning and 2 tablets in the evening 01/25/16   [provider]  metoprolol tartrate (LOPRESSOR) 50 MG tablet Take 50 mg by mouth 2 (two) times daily. 04/27/17   [provider]  omeprazole (PRILOSEC) 20 MG capsule Take 20 mg by mouth daily. prn     [provider]  predniSONE (DELTASONE) 20 MG tablet Take 3 tablets (60 mg total) by mouth daily. 10/27/17   Ward, Layla Maw, DO  traMADol (ULTRAM) 50 MG tablet Take 50 mg by mouth every 6 (six) hours as needed (pain).     [provider]    Family History Family History  Problem Relation Age of Onset  . Heart attack Father 29  . Heart attack Brother 48  . Heart attack Brother        had CABG  . Breast cancer Neg Hx     Social History Social History   Tobacco Use  . Smoking status: Former Smoker    Last attempt to quit: 09/01/1998    Years since  quitting: 19.1  . Smokeless tobacco: Never Used  Substance Use Topics  . Alcohol use: No    Frequency: Never  . Drug use: No     Allergies   Sulfa antibiotics; Sulfonamide derivatives; and Other   Review of Systems Review of Systems  All systems reviewed and negative, other than as noted in HPI.  Physical Exam Updated Vital Signs BP (!) 165/77   Pulse 62   Temp 97.8 F (36.6 C) (Oral)   Resp 17   SpO2 93%   Physical Exam  Constitutional: She is oriented to person, place, and time. She appears well-developed and well-nourished. No distress.  HENT:  Head: Normocephalic and atraumatic.  Eyes: Conjunctivae are normal. Right eye exhibits no discharge. Left eye exhibits no discharge.  Neck: Neck supple.  Cardiovascular: Normal rate, regular  rhythm and normal heart sounds. Exam reveals no gallop and no friction rub.  No murmur heard. Pulmonary/Chest: Effort normal and breath sounds normal. No respiratory distress.  Abdominal: Soft. She exhibits no distension. There is no tenderness.  Musculoskeletal: She exhibits no edema or tenderness.  Neurological: She is alert and oriented to person, place, and time.  Speech slurred, but understandable. Content appropriate. AOx3. CN 2012 appear to be intact. Strength 5/5 b/l u/l ext. Good finger to nose b/l.   Skin: Skin is warm and dry.  Psychiatric: She has a normal mood and affect. Her behavior is normal. Thought content normal.  Nursing note and vitals reviewed.    ED Treatments / Results  Labs (all labs ordered are listed, but only abnormal results are displayed) Labs Reviewed  CBC WITH DIFFERENTIAL/PLATELET - Abnormal; Notable for the following components:      Result Value   WBC 14.5 (*)    Neutro Abs 10.8 (*)    All other components within normal limits  BASIC METABOLIC PANEL - Abnormal; Notable for the following components:   Sodium 134 (*)    Chloride 94 (*)    Glucose, Bld 339 (*)    BUN 21 (*)    Anion gap 17 (*)    All other components within normal limits  URINALYSIS, ROUTINE W REFLEX MICROSCOPIC - Abnormal; Notable for the following components:   Color, Urine STRAW (*)    Glucose, UA >=500 (*)    Ketones, ur 5 (*)    All other components within normal limits    EKG  EKG Interpretation  Date/Time:  Sunday November 01 2017 09:11:34 EST Ventricular Rate:  59 PR Interval:    QRS Duration: 101 QT Interval:  434 QTC Calculation: 430 R Axis:   1 Text Interpretation:  Sinus rhythm Low voltage, precordial leads Confirmed by Raeford RazorKohut, Chibueze Beasley [phone]([id number]) on 11/01/2017 3:05:38 PM       Radiology Ct Head Wo Contrast  Result Date: 11/01/2017 CLINICAL DATA:  Dizzy, status post fall, slurred speech EXAM: CT HEAD WITHOUT CONTRAST TECHNIQUE: Contiguous axial images were  obtained from the base of the skull through the vertex without intravenous contrast. COMPARISON:  None. FINDINGS: Brain: No evidence of acute infarction, hemorrhage, extra-axial collection, ventriculomegaly, or mass effect. Generalized cerebral atrophy. Periventricular white matter low attenuation likely secondary to microangiopathy. Vascular: Cerebrovascular atherosclerotic calcifications are noted. Skull: Negative for fracture or focal lesion. Sinuses/Orbits: Visualized portions of the orbits are unremarkable. Mastoid sinuses are clear. Left maxillary sinus mucosal thickening. Other: None. IMPRESSION: No acute intracranial pathology. Electronically Signed   By: Elige KoHetal  Patel   On: 11/01/2017 11:00    Procedures Procedures (including critical  care time)  Medications Ordered in ED Medications  0.9 %  sodium chloride infusion ( Intravenous New Bag/Given 11/01/17 0925)  promethazine (PHENERGAN) injection 12.5 mg (12.5 mg Intravenous Given 11/01/17 0924)  LORazepam (ATIVAN) injection 0.5 mg (0.5 mg Intravenous Given 11/01/17 1218)     Initial Impression / Assessment and Plan / ED Course  I have reviewed the triage vital signs and the nursing notes.  Pertinent labs & imaging results that were available during my care of the patient were reviewed by me and considered in my medical decision making (see chart for details).     76yF with fall after feeling dizzy/nauseated. Initial impression that vertigo. Peripheral vertigo doesn't explain slurred speech though. Denies any HA to me. CT head w/o acute abnormality. Neuro exam nonfocal aside from vertigo. Will MR. Neurology consultation.   Final Clinical Impressions(s) / ED Diagnoses   Final diagnoses:  Vertigo  Dysarthria    ED Discharge Orders    None       Raeford Razor, MD 11/01/17 1540

## 2017-11-01 NOTE — ED Provider Notes (Signed)
Signed out by Dr Juleen ChinaKohut to check MRI.   MRI shows cerebellar cva. Neurology consulted - they will see.  Medical service consulted for admission.     Cathren LaineSteinl, Ajeet Casasola, MD 11/01/17 [phone]

## 2017-11-02 ENCOUNTER — Other Ambulatory Visit: Payer: Self-pay

## 2017-11-02 ENCOUNTER — Inpatient Hospital Stay (HOSPITAL_COMMUNITY): Payer: PPO

## 2017-11-02 ENCOUNTER — Encounter (HOSPITAL_COMMUNITY): Payer: Self-pay | Admitting: Radiology

## 2017-11-02 DIAGNOSIS — R471 Dysarthria and anarthria: Secondary | ICD-10-CM

## 2017-11-02 DIAGNOSIS — I6300 Cerebral infarction due to thrombosis of unspecified precerebral artery: Secondary | ICD-10-CM

## 2017-11-02 DIAGNOSIS — E871 Hypo-osmolality and hyponatremia: Secondary | ICD-10-CM

## 2017-11-02 DIAGNOSIS — I639 Cerebral infarction, unspecified: Secondary | ICD-10-CM

## 2017-11-02 DIAGNOSIS — I351 Nonrheumatic aortic (valve) insufficiency: Secondary | ICD-10-CM

## 2017-11-02 DIAGNOSIS — I10 Essential (primary) hypertension: Secondary | ICD-10-CM

## 2017-11-02 DIAGNOSIS — I472 Ventricular tachycardia: Secondary | ICD-10-CM

## 2017-11-02 DIAGNOSIS — E119 Type 2 diabetes mellitus without complications: Secondary | ICD-10-CM

## 2017-11-02 DIAGNOSIS — D72829 Elevated white blood cell count, unspecified: Secondary | ICD-10-CM

## 2017-11-02 LAB — GLUCOSE, CAPILLARY
Glucose-Capillary: 158 mg/dL — ABNORMAL HIGH (ref 65–99)
Glucose-Capillary: 191 mg/dL — ABNORMAL HIGH (ref 65–99)
Glucose-Capillary: 186 mg/dL — ABNORMAL HIGH (ref 65–99)
Glucose-Capillary: 167 mg/dL — ABNORMAL HIGH (ref 65–99)
Glucose-Capillary: 118 mg/dL — ABNORMAL HIGH (ref 65–99)

## 2017-11-02 LAB — HEMOGLOBIN A1C
Hgb A1c MFr Bld: 7.6 % — ABNORMAL HIGH (ref 4.8–5.6)
MEAN PLASMA GLUCOSE: 171.42 mg/dL

## 2017-11-02 LAB — ECHOCARDIOGRAM COMPLETE
AOASC: 35 cm
CHL CUP MV DEC (S): 299
CHL CUP TV REG PEAK VELOCITY: 214 cm/s
E/e' ratio: 11.68
EWDT: 299 ms
FS: 24 % — AB (ref 28–44)
IV/PV OW: 0.96
LA diam end sys: 33 mm
LADIAMINDEX: 1.72 cm/m2
LASIZE: 33 mm
LAVOL: 44.8 mL
LAVOLA4C: 31.4 mL
LAVOLIN: 23.3 mL/m2
LV E/e' medial: 11.68
LV E/e'average: 11.68
LV e' LATERAL: 6.85 cm/s
LVOT area: 3.14 cm2
LVOT diameter: 20 mm
Lateral S' vel: 10.4 cm/s
MV Peak grad: 3 mmHg
MV pk A vel: 99.9 m/s
MV pk E vel: 80 m/s
MVAP: 2.5 cm2
MVSPHT: 88 ms
P 1/2 time: 734 ms
PV Reg vel dias: 70.8 cm/s
PW: 13.6 mm — AB (ref 0.6–1.1)
TAPSE: 19.4 mm
TDI e' lateral: 6.85
TDI e' medial: 5.77
TRMAXVEL: 214 cm/s

## 2017-11-02 LAB — LIPID PANEL
CHOL/HDL RATIO: 3.6 ratio
Cholesterol: 139 mg/dL (ref 0–200)
HDL: 39 mg/dL — AB (ref >40)
LDL Cholesterol: 66 mg/dL (ref 0–99)
Triglycerides: 170 mg/dL — ABNORMAL HIGH (ref <150)
VLDL: 34 mg/dL (ref 0–40)

## 2017-11-02 MED ORDER — ACETAMINOPHEN 650 MG RE SUPP: 650.0000 mg | RECTAL | Status: DC | PRN

## 2017-11-02 MED ORDER — INSULIN ASPART 100 UNIT/ML ~~LOC~~ SOLN
0.0000 [IU] | Freq: Three times a day (TID) | SUBCUTANEOUS | Status: DC
  Administered 2017-11-02 (×2): 3 [IU] via SUBCUTANEOUS
  Administered 2017-11-03 (×2): 2 [IU] via SUBCUTANEOUS
  Administered 2017-11-03: 5 [IU] via SUBCUTANEOUS
  Administered 2017-11-04 – 2017-11-05 (×2): 3 [IU] via SUBCUTANEOUS
  Administered 2017-11-05: 5 [IU] via SUBCUTANEOUS

## 2017-11-02 MED ORDER — METOPROLOL TARTRATE 50 MG PO TABS
50.0000 mg | ORAL_TABLET | Freq: Two times a day (BID) | ORAL | Status: DC
  Administered 2017-11-02 – 2017-11-05 (×7): 50 mg via ORAL
  Filled 2017-11-02 (×7): qty 1

## 2017-11-02 MED ORDER — IOPAMIDOL (ISOVUE-370) INJECTION 76%
INTRAVENOUS | Status: AC
  Administered 2017-11-02: 50 mL
  Filled 2017-11-02: qty 50

## 2017-11-02 MED ORDER — STROKE: EARLY STAGES OF RECOVERY BOOK
Freq: Once | Status: AC
  Administered 2017-11-02: 06:00:00

## 2017-11-02 MED ORDER — ACETAMINOPHEN 160 MG/5ML PO SOLN: 650.0000 mg | ORAL | Status: DC | PRN

## 2017-11-02 MED ORDER — INSULIN ASPART 100 UNIT/ML ~~LOC~~ SOLN
0.0000 [IU] | Freq: Every day | SUBCUTANEOUS | Status: DC
  Administered 2017-11-04: 2 [IU] via SUBCUTANEOUS

## 2017-11-02 MED ORDER — INSULIN GLARGINE 100 UNIT/ML ~~LOC~~ SOLN
4.0000 [IU] | Freq: Once | SUBCUTANEOUS | Status: AC
  Administered 2017-11-02: 4 [IU] via SUBCUTANEOUS
  Filled 2017-11-02: qty 0.04

## 2017-11-02 MED ORDER — ASPIRIN 300 MG RE SUPP: 300.0000 mg | Freq: Every day | RECTAL | Status: DC

## 2017-11-02 MED ORDER — INSULIN GLARGINE 100 UNIT/ML ~~LOC~~ SOLN
8.0000 [IU] | Freq: Every day | SUBCUTANEOUS | Status: DC
  Administered 2017-11-03 – 2017-11-04 (×2): 8 [IU] via SUBCUTANEOUS
  Filled 2017-11-02 (×3): qty 0.08

## 2017-11-02 MED ORDER — ACETAMINOPHEN 325 MG PO TABS
650.0000 mg | ORAL_TABLET | ORAL | Status: DC | PRN
  Administered 2017-11-03: 650 mg via ORAL
  Filled 2017-11-02: qty 2

## 2017-11-02 MED ORDER — ASPIRIN 325 MG PO TABS
325.0000 mg | ORAL_TABLET | Freq: Every day | ORAL | Status: DC
  Administered 2017-11-02 – 2017-11-03 (×2): 325 mg via ORAL
  Filled 2017-11-02 (×2): qty 1

## 2017-11-02 MED ORDER — FLECAINIDE ACETATE 50 MG PO TABS
50.0000 mg | ORAL_TABLET | Freq: Two times a day (BID) | ORAL | Status: DC
  Administered 2017-11-02 – 2017-11-05 (×7): 50 mg via ORAL
  Filled 2017-11-02 (×8): qty 1

## 2017-11-02 NOTE — Progress Notes (Signed)
Inpatient Rehabilitation  Per PT request, patient was screened by Zeeshan Korte for appropriateness for an Inpatient Acute Rehab consult.  At this time we are recommending an Inpatient Rehab consult.  Text paged MD to notify; please order if you are agreeable.    Mabeline Varas, M.A., CCC/SLP Admission Coordinator  [facility]  Cell [phone]  

## 2017-11-02 NOTE — [place] FL2 (Signed)
[place] MEDICAID FL2 LEVEL OF CARE SCREENING TOOL     IDENTIFICATION  Patient Name: Yvonne Newton Birthdate: 01/14/1941 Sex: female Admission Date (Current Location): 11/01/2017  [facility] and [place] Number:  Producer, television/film/video and Address:  [facility] [place], [address], [place], [name] [address]      Provider Number: [id number]  Attending Physician Name and Address:  Alberteen Sam, *  Relative Name and Phone Number:       Current Level of Care: Hospital Recommended Level of Care: Skilled Nursing Facility Prior Approval Number:    Date Approved/Denied:   PASRR Number: [id number]  Discharge Plan: SNF    Current Diagnoses: Patient Active Problem List   Diagnosis Date Noted  . 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 Allergic state 07/22/2017  . Arthritis 07/22/2017  . Diabetes mellitus type 2, uncomplicated (HCC) 07/22/2017  . Hyperlipidemia, unspecified 07/22/2017  . Essential hypertension 07/22/2017  . Tachycardia 07/22/2017  . OBESITY 09/05/2009  . VENTRICULAR TACHYCARDIA 09/05/2009    Orientation RESPIRATION BLADDER Height & Weight     Self, Time, Situation, Place  Normal Continent Weight:   Height:     BEHAVIORAL SYMPTOMS/MOOD NEUROLOGICAL BOWEL NUTRITION STATUS      Continent Diet(see DC summary)  AMBULATORY STATUS COMMUNICATION OF NEEDS Skin   Limited Assist Verbally Normal                       Personal Care Assistance Level of Assistance  Bathing, Feeding, Dressing Bathing Assistance: Limited assistance Feeding assistance: Independent Dressing Assistance: Limited assistance     Functional Limitations Info  Sight, Hearing, Speech Sight Info: Adequate Hearing Info: Adequate Speech Info: Adequate    SPECIAL CARE FACTORS FREQUENCY  PT (By licensed PT), OT (By licensed OT)     PT Frequency: 5x/wk OT Frequency: 5x/wk            Contractures Contractures Info: Not present    Additional  Factors Info  Code Status, Allergies, Insulin Sliding Scale Code Status Info: Full Allergies Info: Blueberry Flavor, Sulfa Antibiotics, Sulfonamide Derivatives, Other   Insulin Sliding Scale Info: 0-15 units 3x/day with meals; 0-5 units daily at bedtime; Lantus injection 8 units daily at bed       Current Medications (11/02/2017):  This is the current hospital active medication list Current Facility-Administered Medications  Medication Dose Route Frequency Provider Last Rate Last Dose  . acetaminophen (TYLENOL) tablet 650 mg  650 mg Oral Q4H PRN Danford, Earl Lites, MD       Or  . acetaminophen (TYLENOL) solution 650 mg  650 mg Per Tube Q4H PRN Danford, Earl Lites, MD       Or  . acetaminophen (TYLENOL) suppository 650 mg  650 mg Rectal Q4H PRN Danford, Earl Lites, MD      . aspirin suppository 300 mg  300 mg Rectal Daily Danford, Earl Lites, MD       Or  . aspirin tablet 325 mg  325 mg Oral Daily Danford, Earl Lites, MD   325 mg at 11/02/17 1031  . atorvastatin (LIPITOR) tablet 40 mg  40 mg Oral q1800 Alberteen Sam, MD   40 mg at 11/01/17 1956  . flecainide (TAMBOCOR) tablet 50 mg  50 mg Oral Q12H Danford, Earl Lites, MD   50 mg at 11/02/17 1032  . insulin aspart (novoLOG) injection 0-15 Units  0-15 Units Subcutaneous TID WC Danford, Earl Lites, MD   3 Units  at 11/02/17 1548  . insulin aspart (novoLOG) injection 0-5 Units  0-5 Units Subcutaneous QHS Danford, Christopher P, MD      . insulin glargine (LANTUS) injection 4 Units  4 Units Subcutaneous Once Kilroy, Luke K, PA-C      . [START ON 11/03/2017] insulin glargine (LANTUS) injection 8 Units  8 Units Subcutaneous QHS Kilroy, Luke K, PA-C      . metoprolol tartrate (LOPRESSOR) tablet 50 mg  50 mg Oral BID Alberteen Samanford, Christopher P, MD   50 mg at 11/02/17 1031     Discharge Medications: Please see discharge summary for a list of discharge medications.  Relevant Imaging Results:  Relevant Lab  Results:   Additional Information SS#: [id number]  Baldemar LenisElizabeth M Colburn Asper, LCSW

## 2017-11-02 NOTE — Evaluation (Signed)
Physical Therapy Evaluation Patient Details Name: Yvonne Newton MRN: [id number] DOB: 1941-03-14 Today's Date: 11/02/2017   History of Present Illness  Yvonne Pandannie Delois McKinneyis an 77 y.o.femaleHTN, HLD, DM and Hx ventricular Tachycardia on Flecainide who presents to the ED with acute onset dizziness, ataxia and dysarthria. 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  Clinical Impression  Pt admitted with above. Pt denies any dizziness. Pt with noted L sided weakness, impaired balance, and mild co-ordination deficits. Pt was indep PTA but now requires assist for OOB mobility due to impaired balance and difficulty maintaining upright position. Pt very motivated and would greatly benefit from CIR upon d/c to achieve safe mod I level of function. Pt with good home set up and reports has 24/7 assist at home.    Follow Up Recommendations CIR    Equipment Recommendations  None recommended by PT    Recommendations for Other Services Rehab consult     Precautions / Restrictions Precautions Precautions: Fall Precaution Comments: change in vision,  Restrictions Weight Bearing Restrictions: No      Mobility  Bed Mobility Overal bed mobility: Needs Assistance Bed Mobility: Rolling;Sidelying to Sit Rolling: Supervision Sidelying to sit: Min guard       General bed mobility comments: increased time, used bed rail, v/c's to push up with both L and R hands  Transfers Overall transfer level: Needs assistance Equipment used: 2 person hand held assist Transfers: Sit to/from Stand Sit to Stand: Min assist;Mod assist;+2 physical assistance;+2 safety/equipment         General transfer comment: upon initial standing pt very unsteady with large lateral sway to the R and then to the left requiring modAx2 to maintain balance, returned pt to sitting  Ambulation/Gait Ambulation/Gait assistance: +2 physical assistance;Min assist Ambulation Distance (Feet): 120 Feet Assistive device:  2 person hand held assist Gait Pattern/deviations: Step-through pattern;Decreased step length - right;Decreased step length - left;Staggering left;Staggering right;Wide base of support Gait velocity: slow Gait velocity interpretation: Below normal speed for age/gender General Gait Details: pt with impaired vision, pt with noted staggering with mild L sided weakness/instability. pt with L knee buckling but able to maintain balance with minA via bilat HHA  Stairs            Wheelchair Mobility    Modified Rankin (Stroke Patients Only) Modified Rankin (Stroke Patients Only) Pre-Morbid Rankin Score: No symptoms Modified Rankin: Slight disability     Balance Overall balance assessment: Needs assistance Sitting-balance support: Feet supported;No upper extremity supported Sitting balance-Leahy Scale: Fair     Standing balance support: Bilateral upper extremity supported Standing balance-Leahy Scale: Poor Standing balance comment: pt requires bilat UE support due to staggering L/R                             Pertinent Vitals/Pain Pain Assessment: 0-10 Pain Score: 3  Pain Location: L sided headache Pain Descriptors / Indicators: Headache Pain Intervention(s): Monitored during session    Home Living Family/patient expects to be discharged to:: Private residence Living Arrangements: Children Available Help at Discharge: Family;Available PRN/intermittently(per pt 24/7, per son intermittent) Type of Home: House Home Access: Ramped entrance     Home Layout: 1/2 bath on main level Home Equipment: Walker - standard;Shower seat;Bedside commode;Grab bars - toilet;Grab bars - tub/shower      Prior Function Level of Independence: Independent               Hand Dominance  Extremity/Trunk Assessment   Upper Extremity Assessment Upper Extremity Assessment: LUE deficits/detail LUE Deficits / Details: mild deficit at 4/5 however pt reports she's been  having a hard time holding onto things with her L hand    Lower Extremity Assessment Lower Extremity Assessment: LLE deficits/detail LLE Deficits / Details: grossly 4/5    Cervical / Trunk Assessment Cervical / Trunk Assessment: Normal  Communication   Communication: No difficulties  Cognition Arousal/Alertness: Awake/alert Behavior During Therapy: WFL for tasks assessed/performed Overall Cognitive Status: Within Functional Limits for tasks assessed                                        General Comments General comments (skin integrity, edema, etc.): pt denies dizziness    Exercises     Assessment/Plan    PT Assessment Patient needs continued PT services  PT Problem List Decreased strength;Decreased range of motion;Decreased activity tolerance;Decreased balance;Decreased mobility;Decreased coordination;Decreased cognition;Decreased knowledge of use of DME;Decreased safety awareness       PT Treatment Interventions DME instruction;Gait training;Stair training;Functional mobility training;Therapeutic activities;Therapeutic exercise;Balance training    PT Goals (Current goals can be found in the Care Plan section)  Acute Rehab PT Goals Patient Stated Goal: home PT Goal Formulation: With patient Time For Goal Achievement: 11/16/17 Potential to Achieve Goals: Good Additional Goals Additional Goal #1: Pt to score >19 on DGI to indicate minimal falls risk.    Frequency Min 4X/week   Barriers to discharge Decreased caregiver support unsure if pt has 24/7 assist    Co-evaluation               AM-PAC PT "6 Clicks" Daily Activity  Outcome Measure Difficulty turning over in bed (including adjusting bedclothes, sheets and blankets)?: None Difficulty moving from lying on back to sitting on the side of the bed? : Unable Difficulty sitting down on and standing up from a chair with arms (e.g., wheelchair, bedside commode, etc,.)?: Unable Help needed moving  to and from a bed to chair (including a wheelchair)?: A Lot Help needed walking in hospital room?: A Lot Help needed climbing 3-5 steps with a railing? : A Lot 6 Click Score: 12    End of Session Equipment Utilized During Treatment: Gait belt Activity Tolerance: Patient tolerated treatment well Patient left: in chair;with call bell/phone within reach;with chair alarm set;with family/visitor present Nurse Communication: Mobility status PT Visit Diagnosis: Unsteadiness on feet (R26.81);Other abnormalities of gait and mobility (R26.89);Repeated falls (R29.6);Muscle weakness (generalized) (M62.81);History of falling (Z91.81);Difficulty in walking, not elsewhere classified (R26.2)    Time: 1610-9604 PT Time Calculation (min) (ACUTE ONLY): 23 min   Charges:   PT Evaluation $PT Eval Moderate Complexity: 1 Mod PT Treatments $Gait Training: 8-22 mins   PT G Codes:        Lewis Shock, PT, DPT Pager #: [phone] Office #: [phone]   Emilee Market M Crystina Borrayo 11/02/2017, 10:57 AM

## 2017-11-02 NOTE — H&P (View-Only) (Signed)
    [facility] has been requested to perform a transesophageal echocardiogram on Yvonne Newton for stroke.  After careful review of history and examination, the risks and benefits of transesophageal echocardiogram have been explained including risks of esophageal damage, perforation (1:10,000 risk), bleeding, pharyngeal hematoma as well as other potential complications associated with conscious sedation including aspiration, arrhythmia, respiratory failure and death. Alternatives to treatment were discussed with the patient and her daughters, questions were answered. Patient is willing to proceed.   Aiya Keach, PA-C  11/02/2017 3:52 PM  

## 2017-11-02 NOTE — Progress Notes (Signed)
PROGRESS NOTE    Yvonne Newton  ZOX:[id number] DOB: 1940/09/26 DOA: 11/01/2017 PCP: Jerl Mina, MD      Brief Narrative:  Yvonne Newton is a 77 y.o. 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  Initially treated in ER for BPPV, follow up MRI showed L small superior cerebellar infarct.  She did have a recent case of hives after eating a blueberry muffin, and was on recent prednisone, but no other new recent illnesses.      Assessment & Plan:  1. Acute ischemic infarct, LEFT cerebellum Presenting with ataxia, vertigo/dizziness, and left sided ataxic gait/discoordination.  MR shows infarct.  No previous Afib.  Not on statin, aspirin.  Microhemorrhage on imaging, okay for aspirin.  LDL 66, HgbA1c 7.6%. -Continue full aspirin -Continue new statin -Permissive hypertension for now -Follow-up carotid Doppler, echocardiogram -PT recommends CIR -Consulted neurology, appreciate recommendations   2.  History of ventricular tachycardia: Followed by Dr. Ladona Ridgel.  This is not a stroke risk factor. -Monitor on telemetry - Continue flecainide, metoprolol  3. Diabetes: -Hold metformin -Lantus while in the hospital -SSI with meals  4.  Hypertension: -Permissive hypertension for now -Hold amlodipine, continue metoprolol   5. Leukocytosis Reactive, no localizing signs of infection.      Core measures: -VTE prophylaxis ordered at time of admission -Aspirin ordered at admission -Atrial fibrillation: not previously known or present -tPA not given because of outside window -Dysphagia screen ordered in ER -Lipids ordered -PT eval ordered -Nonsmoker        DVT prophylaxis: SCDs Code Status: FULL Family Communication: Son at bedside MDM and disposition Plan: The below labs and imaging reports were reviewed.  The patient's status is clinically improving.  She still has left sided deficits corresponding to  her ipsilateral cerebellar infarct, as well as dizziness and is high risk to fall.  She has high motivation for rehab, and excellent potential for brisk recovery in CIR and so this is recommended.  CIR eval and insurance coverage pending.     Consultants:   Neuro  Procedures:   US carotids  MRI brain  Echocardiogram  Antimicrobials:   None    Subjective: Feels well.  Still dizzy but improving.  Left sided ataxia, in arm and leg.  No paresthesias, aphasia or slurred speech, no vision changes, no confusion, no focal weakness.  Objective: Vitals:   11/02/17 0035 11/02/17 0408 11/02/17 0817 11/02/17 1230  BP: (!) 132/51 (!) 155/55 (!) 142/59 (!) 147/47  Pulse: 74 70 80 65  Resp: 18 19 16 17   Temp: (!) 97.2 F (36.2 C) 98.4 F (36.9 C) 98.9 F (37.2 C) 99 F (37.2 C)  TempSrc: Axillary Oral Oral Oral  SpO2: 96% 97% 93% 91%    Intake/Output Summary (Last 24 hours) at 11/02/2017 1416 Last data filed at 11/02/2017 0600 Gross per 24 hour  Intake 2063.33 ml  Output 1320 ml  Net 743.33 ml   There were no vitals filed for this visit.  Examination: General appearance: Elderly  adult female, alert and in no acute distress.   HEENT: Anicteric, conjunctiva pink, lids and lashes normal. No nasal deformity, discharge, epistaxis.  Lips moist.   Skin: Warm and dry.  No jaundice.  No suspicious rashes or lesions.  She has one  Cardiac: RRR, nl S1-S2, no murmurs appreciated.  Capillary refill is brisk.  JVP not visible due to habitus.  No LE edema.  Radial pulses 2+ and symmetric.  Respiratory: Normal respiratory rate and rhythm.  CTAB without rales or wheezes. Abdomen: Abdomen soft.  No TTP. No ascites, distension, hepatosplenomegaly.   MSK: No deformities or effusions. Neuro: Awake and alert.  Left sided ataxia to FTN testing, more than right.  Heel to shin seems symmetric.  Cranial nervs 3-12 intact.  EOMI, moves all extremities. Speech fluent.    Psych: Sensorium intact and  responding to questions, attention normal. Affect blunted, sad.  Judgment and insight appear normal.    Data Reviewed: I have personally reviewed following labs and imaging studies:  CBC: Recent Labs  Lab 11/01/17 0917  WBC 14.5*  NEUTROABS 10.8*  HGB 14.4  HCT 43.3  MCV 89.3  PLT 236   Basic Metabolic Panel: Recent Labs  Lab 11/01/17 0917  NA 134*  K 3.9  CL 94*  CO2 23  GLUCOSE 339*  BUN 21*  CREATININE 0.71  CALCIUM 9.2   GFR: CrCl cannot be calculated (Unknown ideal weight.). Liver Function Tests: No results for input(s): AST, ALT, ALKPHOS, BILITOT, PROT, ALBUMIN in the last 168 hours. No results for input(s): LIPASE, AMYLASE in the last 168 hours. No results for input(s): AMMONIA in the last 168 hours. Coagulation Profile: No results for input(s): INR, PROTIME in the last 168 hours. Cardiac Enzymes: No results for input(s): CKTOTAL, CKMB, CKMBINDEX, TROPONINI in the last 168 hours. BNP (last 3 results) No results for input(s): PROBNP in the last 8760 hours. HbA1C: Recent Labs    11/02/17 0502  HGBA1C 7.6*   CBG: Recent Labs  Lab 11/01/17 2116 11/02/17 0651 11/02/17 1043 11/02/17 1340  GLUCAP 141* 158* 191* 186*   Lipid Profile: Recent Labs    11/02/17 0502  CHOL 139  HDL 39*  LDLCALC 66  TRIG 161170*  CHOLHDL 3.6   Thyroid Function Tests: No results for input(s): TSH, T4TOTAL, FREET4, T3FREE, THYROIDAB in the last 72 hours. Anemia Panel: No results for input(s): VITAMINB12, FOLATE, FERRITIN, TIBC, IRON, RETICCTPCT in the last 72 hours. Urine analysis:    Component Value Date/Time   COLORURINE STRAW (A) 11/01/2017 1215   APPEARANCEUR CLEAR 11/01/2017 1215   LABSPEC 1.012 11/01/2017 1215   PHURINE 7.0 11/01/2017 1215   GLUCOSEU >=500 (A) 11/01/2017 1215   HGBUR NEGATIVE 11/01/2017 1215   BILIRUBINUR NEGATIVE 11/01/2017 1215   KETONESUR 5 (A) 11/01/2017 1215   PROTEINUR NEGATIVE 11/01/2017 1215   NITRITE NEGATIVE 11/01/2017 1215    LEUKOCYTESUR NEGATIVE 11/01/2017 1215   Sepsis Labs: @LABRCNTIP (procalcitonin:4,lacticacidven:4)  )No results found for this or any previous visit (from the past 240 hour(s)).       Radiology Studies: Ct Angio Head W Or Wo Contrast  Result Date: 11/02/2017 CLINICAL DATA:  Stroke EXAM: CT ANGIOGRAPHY HEAD AND NECK TECHNIQUE: Multidetector CT imaging of the head and neck was performed using the standard protocol during bolus administration of intravenous contrast. Multiplanar CT image reconstructions and MIPs were obtained to evaluate the vascular anatomy. Carotid stenosis measurements (when applicable) are obtained utilizing NASCET criteria, using the distal internal carotid diameter as the denominator. CONTRAST:  50mL ISOVUE-370 IOPAMIDOL (ISOVUE-370) INJECTION 76% COMPARISON:  MRI head 11/01/2017 FINDINGS: CTA NECK FINDINGS Aortic arch: Atherosclerotic disease in the aortic arch and proximal great vessels. Proximal great vessels adequately patent. Right carotid system: Atherosclerotic calcification in the carotid bifurcation and proximal right internal carotid artery. 35% diameter stenosis right internal carotid artery. Left carotid system: Mild atherosclerotic calcification left carotid bifurcation without significant stenosis. Vertebral arteries: Atherosclerotic calcification in the proximal right internal  carotid artery narrowing the lumen by approximately 80% diameter stenosis. Mild postop not dilatation. Scattered atherosclerotic disease in the right vertebral artery. Right vertebral artery ends in PICA with minimal contribution to the basilar. Mild atherosclerotic calcification and mild stenosis origin of left vertebral artery. Remainder of the left vertebral artery is patent without stenosis or dissection. Skeleton: Disc degeneration and spondylosis C5-6. No acute skeletal abnormality. Other neck: Negative for mass or adenopathy. Upper chest: Negative Review of the MIP images confirms the above  findings CTA HEAD FINDINGS Anterior circulation: Cavernous carotid widely patent with mild atherosclerotic calcification. Anterior and middle cerebral arteries widely patent without stenosis. Posterior circulation: Left vertebral artery dominant and widely patent. Right vertebral artery ends in PICA with minimal contribution to the basilar. Basilar widely patent. PICA patent bilaterally. Superior cerebellar and posterior cerebral arteries patent bilaterally. Note is made of recent superior cerebellar infarct on the left. Venous sinuses: Negative Anatomic variants: None Delayed phase: Hypodensity left superior cerebellum compatible with acute infarct as noted on recent MRI. No enhancing mass lesion. Chronic microvascular ischemic changes in the white matter. Review of the MIP images confirms the above findings IMPRESSION: Subacute infarct left superior cerebellum similar to MRI yesterday 35% diameter stenosis proximal right internal carotid artery due to atherosclerotic disease. Mild atherosclerotic disease left carotid bifurcation. 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 Right vertebral artery has minimal contribution to the basilar. Mild atherosclerotic stenosis proximal left vertebral artery. Left superior cerebellar artery widely patent. No significant intracranial stenosis. Electronically Signed   By: Marlan Palau M.D.   On: 11/02/2017 13:38   Ct Head Wo Contrast  Result Date: 11/01/2017 CLINICAL DATA:  Dizzy, status post fall, slurred speech EXAM: CT HEAD WITHOUT CONTRAST TECHNIQUE: Contiguous axial images were obtained from the base of the skull through the vertex without intravenous contrast. COMPARISON:  None. FINDINGS: Brain: No evidence of acute infarction, hemorrhage, extra-axial collection, ventriculomegaly, or mass effect. Generalized cerebral atrophy. Periventricular white matter low attenuation likely  secondary to microangiopathy. Vascular: Cerebrovascular atherosclerotic calcifications are noted. Skull: Negative for fracture or focal lesion. Sinuses/Orbits: Visualized portions of the orbits are unremarkable. Mastoid sinuses are clear. Left maxillary sinus mucosal thickening. Other: None. IMPRESSION: No acute intracranial pathology. Electronically Signed   By: Elige Ko   On: 11/01/2017 11:00   Ct Angio Neck W Or Wo Contrast  Result Date: 11/02/2017 CLINICAL DATA:  Stroke EXAM: CT ANGIOGRAPHY HEAD AND NECK TECHNIQUE: Multidetector CT imaging of the head and neck was performed using the standard protocol during bolus administration of intravenous contrast. Multiplanar CT image reconstructions and MIPs were obtained to evaluate the vascular anatomy. Carotid stenosis measurements (when applicable) are obtained utilizing NASCET criteria, using the distal internal carotid diameter as the denominator. CONTRAST:  50mL ISOVUE-370 IOPAMIDOL (ISOVUE-370) INJECTION 76% COMPARISON:  MRI head 11/01/2017 FINDINGS: CTA NECK FINDINGS Aortic arch: Atherosclerotic disease in the aortic arch and proximal great vessels. Proximal great vessels adequately patent. Right carotid system: Atherosclerotic calcification in the carotid bifurcation and proximal right internal carotid artery. 35% diameter stenosis right internal carotid artery. Left carotid system: Mild atherosclerotic calcification left carotid bifurcation without significant stenosis. Vertebral arteries: Atherosclerotic calcification in the proximal right internal carotid artery narrowing the lumen by approximately 80% diameter stenosis. Mild postop not dilatation. Scattered atherosclerotic disease in the right vertebral artery. Right vertebral artery ends in PICA with minimal contribution to the basilar. Mild atherosclerotic calcification and mild stenosis origin of left vertebral  artery. Remainder of the left vertebral artery is patent without stenosis or dissection.  Skeleton: Disc degeneration and spondylosis C5-6. No acute skeletal abnormality. Other neck: Negative for mass or adenopathy. Upper chest: Negative Review of the MIP images confirms the above findings CTA HEAD FINDINGS Anterior circulation: Cavernous carotid widely patent with mild atherosclerotic calcification. Anterior and middle cerebral arteries widely patent without stenosis. Posterior circulation: Left vertebral artery dominant and widely patent. Right vertebral artery ends in PICA with minimal contribution to the basilar. Basilar widely patent. PICA patent bilaterally. Superior cerebellar and posterior cerebral arteries patent bilaterally. Note is made of recent superior cerebellar infarct on the left. Venous sinuses: Negative Anatomic variants: None Delayed phase: Hypodensity left superior cerebellum compatible with acute infarct as noted on recent MRI. No enhancing mass lesion. Chronic microvascular ischemic changes in the white matter. Review of the MIP images confirms the above findings IMPRESSION: Subacute infarct left superior cerebellum similar to MRI yesterday 35% diameter stenosis proximal right internal carotid artery due to atherosclerotic disease. Mild atherosclerotic disease left carotid bifurcation. 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 Right vertebral artery has minimal contribution to the basilar. Mild atherosclerotic stenosis proximal left vertebral artery. Left superior cerebellar artery widely patent. No significant intracranial stenosis. Electronically Signed   By: Marlan Palau M.D.   On: 11/02/2017 13:38   Mr Brain Wo Contrast  Result Date: 11/01/2017 CLINICAL DATA:  Initial evaluation for acute altered mental status. EXAM: MRI HEAD WITHOUT CONTRAST TECHNIQUE: Multiplanar, multiecho pulse sequences of the brain and surrounding structures were obtained without intravenous contrast.  COMPARISON:  Comparison made with prior CT from earlier the same day. FINDINGS: Brain: Generalized age appropriate cerebral atrophy. Patchy and confluent T2/FLAIR hyperintensity within the periventricular and deep white matter both cerebral hemispheres, most consistent with chronic small vessel ischemic disease, mild in nature. Patchy small volume abnormal restricted diffusion seen involving the superior left cerebellar hemisphere, consistent with acute ischemic infarct (series 3, image 17). Finding involves the left superior cerebellar artery territory. Involvement of the left cerebellar vermis. No significant mass effect. Associated small volume petechial hemorrhage without hemorrhagic transformation (series 9, image 25). No other evidence for acute or subacute ischemia. No encephalomalacia to suggest chronic cortical infarction. No other evidence for acute or chronic intracranial hemorrhage. No mass lesion, midline shift or mass effect. No hydrocephalus. No extra-axial fluid collection. Major dural sinuses are grossly patent. Pituitary gland suprasellar region normal. Midline structures intact and normal. Vascular: Major intracranial vascular flow voids are maintained at the skull base. Skull and upper cervical spine: Craniocervical junction normal. Upper cervical spine normal. Bone marrow signal intensity within normal limits. Mild hyperostosis frontalis interna noted. No scalp soft tissue abnormality. Sinuses/Orbits: Globes and orbital soft tissues within normal limits. Axial myopia noted. Mild scattered mucosal thickening within the ethmoidal air cells and maxillary sinuses. Paranasal sinuses are otherwise clear. No mastoid effusion. Inner ear structures normal. Other: None IMPRESSION: 1. 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 Associated small volume petechial hemorrhage without hemorrhagic frank transformation. No significant mass effect.  2. No other acute intracranial abnormality. 3. Mild for age chronic small vessel ischemic disease. Electronically Signed   By: Rise Mu M.D.   On: 11/01/2017 16:45        Scheduled Meds: . aspirin  300 mg Rectal Daily   Or  . aspirin  325 mg Oral Daily  . atorvastatin  40  mg Oral q1800  . flecainide  50 mg Oral Q12H  . insulin aspart  0-15 Units Subcutaneous TID WC  . insulin aspart  0-5 Units Subcutaneous QHS  . insulin glargine  8 Units Subcutaneous QHS  . metoprolol tartrate  50 mg Oral BID   Continuous Infusions:   LOS: 1 day    Time spent: 25 minutes    Alberteen Sam, MD [facility] 11/02/2017, 2:16 PM     Pager [phone] --- please page though AMION:  [email or web address] If 7PM-7AM, please contact night-coverage

## 2017-11-02 NOTE — Progress Notes (Signed)
Preliminary notes by tech-- Bilateral carotid duplex exam completed.  1-39% stenosis of ICA bilaterally. Bilateral vertebral arteries antegrade flow. No significant hemodynamic stenosis.  Hongying Charly Holcomb(RDMS, RVT)

## 2017-11-02 NOTE — Progress Notes (Signed)
   Echocardiogram 2D Echocardiogram has been performed.  Yvonne Newton Yvonne Newton 11/02/2017, 1:36 PM

## 2017-11-02 NOTE — Progress Notes (Signed)
OT Cancellation Note  Patient Details Name: Yvonne Newton MRN: [id number] DOB: April 30, 1941   Cancelled Treatment:    Reason Eval/Treat Not Completed: Patient at procedure or test/ unavailable.  Will reattempt.  Oscar Hank [place], OTR/L [phone]   Jeani HawkingConarpe, Niall Illes M 11/02/2017, 5:59 PM

## 2017-11-02 NOTE — Care Management Note (Signed)
Case Management Note  Patient Details  Name: Yvonne Newton MRN: [id number] Date of Birth: 01-06-1941  Subjective/Objective:    Pt admitted with CVA. She is from home with family.              Action/Plan: PT recommending CIR. Awaiting OT eval.  CM following for d/c disposition.   Expected Discharge Date:                  Expected Discharge Plan:     In-House Referral:     Discharge planning Services     Post Acute Care Choice:    Choice offered to:     DME Arranged:    DME Agency:     HH Arranged:    HH Agency:     Status of Service:  In process, will continue to follow  If discussed at Long Length of Stay Meetings, dates discussed:    Additional Comments:  Kermit BaloKelli F Jayzen Paver, RN 11/02/2017, 11:37 AM

## 2017-11-02 NOTE — Progress Notes (Signed)
    [facility] has been requested to perform a transesophageal echocardiogram on Yvonne Newton for stroke.  After careful review of history and examination, the risks and benefits of transesophageal echocardiogram have been explained including risks of esophageal damage, perforation (1:10,000 risk), bleeding, pharyngeal hematoma as well as other potential complications associated with conscious sedation including aspiration, arrhythmia, respiratory failure and death. Alternatives to treatment were discussed with the patient and her daughters, questions were answered. Patient is willing to proceed.   Corine ShelterLuke Hiilani Jetter, [place]-C  11/02/2017 3:52 PM

## 2017-11-02 NOTE — Progress Notes (Addendum)
STROKE TEAM PROGRESS NOTE   HPI   Yvonne Newton is an 77 y.o. 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 Daughters at bedside say the patients symptoms started at approximately 6:15 AM and worsened throughout the day. Per her daughters she fell at home today onto her left side.  MRI Head reveals: Acute Left cerebral artery infarct. Patient takes no ASA or statin at home. She is compliant with all of her other medications.  Patient states she has had a "nagging" left side H/A for the past few weeks with associated "eye stain". The patient also admits to very poor sleep (less than 4 hours per night) for the past month. She denies any H/A or visual changes on exam today.  Date last known well: Date: 11/01/2017 Time last known well: Time: 06:15 tPA Given: No: outside the window Modified Rankin: Rankin Score=0 NIHSS: 1    SUBJECTIVE (INTERVAL HISTORY) Her son is at the bedside.  She continues to complain of dizziness.    OBJECTIVE Vitals:   11/01/17 2222 11/02/17 0035 11/02/17 0408 11/02/17 0817  BP: (!) 141/60 (!) 132/51 (!) 155/55 (!) 142/59  Pulse: 66 74 70 80  Resp: 18 18 19 16   Temp: 98.2 F (36.8 C) (!) 97.2 F (36.2 C) 98.4 F (36.9 C) 98.9 F (37.2 C)  TempSrc: Oral Axillary Oral Oral  SpO2: 95% 96% 97% 93%    CBC:  Recent Labs  Lab 11/01/17 0917  WBC 14.5*  NEUTROABS 10.8*  HGB 14.4  HCT 43.3  MCV 89.3  PLT 236    Basic Metabolic Panel:  Recent Labs  Lab 11/01/17 0917  NA 134*  K 3.9  CL 94*  CO2 23  GLUCOSE 339*  BUN 21*  CREATININE 0.71  CALCIUM 9.2    Lipid Panel:     Component Value Date/Time   CHOL 139 11/02/2017 0502   TRIG 170 (H) 11/02/2017 0502   HDL 39 (L) 11/02/2017 0502   CHOLHDL 3.6 11/02/2017 0502   VLDL 34 11/02/2017 0502   LDLCALC 66 11/02/2017 0502   HgbA1c:  Lab Results  Component Value Date   HGBA1C 7.6 (H)  11/02/2017   Urine Drug Screen: No results found for: LABOPIA, COCAINSCRNUR, LABBENZ, AMPHETMU, THCU, LABBARB  Alcohol Level No results found for: ETH  IMAGING  Ct Head Wo Contrast 11/01/2017 No acute intracranial pathology.   Mr Brain Wo Contrast 11/01/2017 1. 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 Associated small volume petechial hemorrhage without hemorrhagic frank transformation. No significant mass effect. 2. No other acute intracranial abnormality. 3. Mild for age chronic small vessel ischemic disease.    PHYSICAL EXAM Pleasant elderly obese african [place] lady currently not in distress. . Afebrile. Head is nontraumatic. Neck is supple without bruit.    Cardiac exam no murmur or gallop. Lungs are clear to auscultation. Distal pulses are well felt. Neurological Exam ;  Awake  Alert oriented x 3. Normal speech and language.eye movements full without nystagmus.fundi were not visualized. Vision acuity and fields appear normal. Hearing is normal. Palatal movements are normal. Face symmetric. Tongue midline. Normal strength, tone, reflexes and coordination. Normal sensation. Gait deferred.  ASSESSMENT/PLAN Yvonne Newton is a 77 y.o. 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 She did not receive IV t-PA due to  delay in arrival.   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 Await CTA to help confirm source.   Resultant  Dizziness & imbalance  CT head no acute abnormality  MRI head small L SCA infarct w/ sm vol petechial hmg. Small vessel disease.  CTA head & neck , left superior cerebellar infarct. R ICA 35% stenosis. R VA 80% stenosis with minimal contribution to BA. L SCA open. No significant anterior intracranial  stenosis  Carotid Doppler   There is 1-39% bilateral ICA stenosis. Vertebral artery flow is antegrade.    2D Echo  done, results pending  TEE to look for embolic source. Arranged with [facility] for tomorrow.  If positive for PFO (patent foramen ovale), check bilateral lower extremity venous dopplers to rule out DVT as possible source of stroke. (I have made patient NPO after midnight tonight).   LDL 66  HgbA1c 7.6  SCDs for VTE prophylaxis Fall precautions Diet heart healthy/carb modified Room service appropriate? Yes; Fluid consistency: Thin  No antithrombotic prior to admission, now on aspirin 325 mg daily (plan discharge on aspirin 81 mg daily)  Patient counseled to be compliant with her antithrombotic medications  Ongoing aggressive stroke risk factor management  Therapy recommendations:  CIR, consult pending   Disposition:  pending   Hypertension  Stable  Slight elevation treated as OP by cardiology  Permissive hypertension (OK if < 220/120) but gradually normalize in 5-7 days  Long-term BP goal normotensive  Hyperlipidemia  Home meds:  None  lipitor 40 mg daily started in the hospital  LDL 66, at goal < 70  Continue statin at discharge  Diabetes 2  HgbA1c 7.6, goal < 7.0  Uncontrolled  Other Stroke Risk Factors  Advanced age  Former  Cigarette smoker, quit about 19 years ago   Obesity, There is no height or weight on file to calculate BMI., recommend weight loss, diet and exercise as appropriate   Other Active Problems  Hx VT on flecainide   Leukocytosis, 14.5 felt to be reactive  Hospital day # 1  Rhoderick MoodySharon Biby  [facility] See Amion for Pager information 11/02/2017 2:50 PM  I have personally examined this patient, reviewed notes, independently viewed imaging studies, participated in medical decision making and plan of care.ROS completed by me personally and pertinent positives fully documented  I have made  any additions or clarifications directly to the above note. Agree with note above. She presented with dizziness secondary to left superior cerebellar artery embolic infarct etiology to be determined. Recommend ongoing stroke workup and check TEE and loop recorder. Long discussion with patient and answered questions about her stroke and evaluation and treatment plan. Greater than 50% time during this 35 minute visit was spent on counseling and coordination of care about her embolic stroke and answering questions  Delia HeadyPramod Choua Chalker, MD Medical Director [facility] Pager: [phone] 11/02/2017 4:01 PM  To contact Stroke Continuity provider, please refer to [email or web address]. After hours, contact General Neurology

## 2017-11-02 NOTE — Consult Note (Signed)
Physical Medicine and Rehabilitation Consult   Reason for Consult: Functional deficits due to stroke.  Referring Physician: Dr. Maryfrances Bunnellanford.    HPI: Yvonne Newton is a 77 y.o. female with history of HTN, T2DM, Ventricular tachycardia- on flecanide who was admitted on 11/01/17 with fall out of bed, dizziness, slurred speech and pain. History taken from chart review and patient.  Family reported intermittent bouts of confusion as well as left HA for few weeks prior to admission.  MRI brain reviewed, showing left cerebellar infarct. Per report, small acute ischemic infarct in left SCA involving left cerebellum and associated small petechial hemorrhage without hemorrhagic transformation.  Stroke felt to be due to small vessel disease v/s cardioembolic.  Work up [employer] Associationunderway--Carotid dopplers, echo and CTA head/neck  done today. PT evaluation done revealing impaired vision as well as left knee instability affecting gait. CIR recommended due to functional deficits.   Review of Systems  HENT: Negative for hearing loss and tinnitus.   Eyes: Negative for blurred vision and double vision.  Respiratory: Negative for cough and shortness of breath.   Cardiovascular: Negative for chest pain and palpitations.  Gastrointestinal: Negative for heartburn and nausea.  Musculoskeletal: Positive for back pain and myalgias.  Neurological: Positive for speech change, focal weakness and weakness. Negative for dizziness and headaches.  Psychiatric/Behavioral: Negative for memory loss. The patient is not nervous/anxious.   All other systems reviewed and are negative.   Past Medical History:  Diagnosis Date  . DM (diabetes mellitus) (HCC)   . Obesity   . Ventricular tachycardia [facility](HCC)     Past Surgical History:  Procedure Laterality Date  . BREAST BIOPSY Left 2010   Negative    Family History  Problem Relation Age of Onset  . Heart attack Father [id number]  . Heart attack Brother 48  . Heart attack  Brother        had CABG  . Breast cancer Neg Hx     Social History:  Lives with family. Independent PTA.  She reports that she quit smoking about 19 years ago.  She has never used smokeless tobacco. She reports that she does not drink alcohol or use drugs.    Allergies  Allergen Reactions  . Blueberry Flavor Hives and Shortness Of Breath  . Sulfa Antibiotics Itching  . Sulfonamide Derivatives Itching  . Other Swelling and Rash    METAL    Medications Prior to Admission  Medication Sig Dispense Refill  . amLODipine (NORVASC) 2.5 MG tablet Take 2.5 mg by mouth 2 (two) times daily.    . CYANOCOBALAMIN IJ Inject as directed every 30 (thirty) days. On or about the 26th of each month    . EPINEPHrine (EPIPEN 2-PAK) 0.3 mg/0.3 mL IJ SOAJ injection Inject 0.3 mg into the muscle once as needed (severe allergic reaction).    . famotidine (PEPCID) 20 MG tablet Take 1 tablet (20 mg total) by mouth 2 (two) times daily. 10 tablet 0  . flecainide (TAMBOCOR) 50 MG tablet TAKE 1 TABLET (50 MG TOTAL) BY MOUTH 2 (TWO) TIMES DAILY. 60 tablet 2  . meloxicam (MOBIC) 15 MG tablet Take 15 mg by mouth daily.    . metFORMIN (GLUCOPHAGE-XR) 500 MG 24 hr tablet Take 500-1,000 mg by mouth See admin instructions. Take one tablet (500 mg) by mouth every morning and two tablets (1000 mg) at bedtime    . omeprazole (PRILOSEC) 20 MG capsule Take 20 mg by mouth daily.     .Marland Kitchen  predniSONE (DELTASONE) 20 MG tablet Take 3 tablets (60 mg total) by mouth daily. (Patient taking differently: Take 20-40 mg by mouth See admin instructions. Started 10/27/17  - 5 day course: take 2 tablets (40 mg) by mouth every morning and 1 tablet (20 mg) at night) 15 tablet 0  . Vitamin D, Ergocalciferol, (DRISDOL) 50000 units CAPS capsule Take 50,000 Units by mouth every 7 (seven) days.    . metoprolol tartrate (LOPRESSOR) 50 MG tablet Take 50 mg by mouth 2 (two) times daily.      Home: Home Living Family/patient expects to be discharged to::  Private residence Living Arrangements: Children Available Help at Discharge: Family, Available PRN/intermittently(per pt 24/7, per son intermittent) Type of Home: House Home Access: Ramped entrance Home Layout: 1/2 bath on main level Bathroom Shower/Tub: Engineer, manufacturing systems: Standard(and handicapped height) Home Equipment: Environmental consultant - standard, Shower seat, Bedside commode, Grab bars - toilet, Grab bars - tub/shower  Functional History: Prior Function Level of Independence: Independent Functional Status:  Mobility: Bed Mobility Overal bed mobility: Needs Assistance Bed Mobility: Rolling, Sidelying to Sit Rolling: Supervision Sidelying to sit: Min guard General bed mobility comments: increased time, used bed rail, v/c's to push up with both L and R hands Transfers Overall transfer level: Needs assistance Equipment used: 2 person hand held assist Transfers: Sit to/from Stand Sit to Stand: Min assist, Mod assist, +2 physical assistance, +2 safety/equipment General transfer comment: upon initial standing pt very unsteady with large lateral sway to the R and then to the left requiring modAx2 to maintain balance, returned pt to sitting Ambulation/Gait Ambulation/Gait assistance: +2 physical assistance, Min assist Ambulation Distance (Feet): 120 Feet Assistive device: 2 person hand held assist Gait Pattern/deviations: Step-through pattern, Decreased step length - right, Decreased step length - left, Staggering left, Staggering right, Wide base of support General Gait Details: pt with impaired vision, pt with noted staggering with mild L sided weakness/instability. pt with L knee buckling but able to maintain balance with minA via bilat HHA Gait velocity: slow Gait velocity interpretation: Below normal speed for age/gender    ADL:    Cognition: Cognition Overall Cognitive Status: Within Functional Limits for tasks assessed Orientation Level: Oriented  X4 Cognition Arousal/Alertness: Awake/alert Behavior During Therapy: WFL for tasks assessed/performed Overall Cognitive Status: Within Functional Limits for tasks assessed   Blood pressure (!) 142/59, pulse 80, temperature 98.9 F (37.2 C), temperature source Oral, resp. rate 16, SpO2 93 %. Physical Exam  Nursing note and vitals reviewed. Constitutional: She is oriented to person, place, and time. She appears well-developed and well-nourished. She is sleeping. She is easily aroused. No distress.  HENT:  Head: Normocephalic and atraumatic.  Mouth/Throat: Oropharynx is clear and moist.  Eyes: EOM are normal. Pupils are equal, round, and reactive to light. Right eye exhibits no discharge. Left eye exhibits no discharge.  Neck: Normal range of motion. Neck supple.  Cardiovascular: Normal rate and regular rhythm.  Respiratory: Effort normal and breath sounds normal. No stridor. No respiratory distress. She has no wheezes.  GI: Soft. Bowel sounds are normal. She exhibits no distension. There is no tenderness.  Musculoskeletal: She exhibits no edema or tenderness.  Neurological: She is oriented to person, place, and time and easily aroused.  Sleepy and had difficulty time staying awake.  Slurred speech.  Right facial weakness.  LUE/LLE: 5/5 proximal to distal RUE/RLE: 4-4+/5 proximal to distal  Skin: Skin is warm and dry. She is not diaphoretic.  Psychiatric: She has a normal  mood and affect. Her speech is slurred. She is slowed.    Results for orders placed or performed during the hospital encounter of 11/01/17 (from the past 24 hour(s))  Glucose, capillary     Status: Abnormal   Collection Time: 11/01/17  9:16 PM  Result Value Ref Range   Glucose-Capillary 141 (H) 65 - 99 mg/dL  Hemoglobin W0J     Status: Abnormal   Collection Time: 11/02/17  5:02 AM  Result Value Ref Range   Hgb A1c MFr Bld 7.6 (H) 4.8 - 5.6 %   Mean Plasma Glucose 171.42 mg/dL  Lipid panel     Status: Abnormal    Collection Time: 11/02/17  5:02 AM  Result Value Ref Range   Cholesterol 139 0 - 200 mg/dL   Triglycerides 811 (H) <150 mg/dL   HDL 39 (L) >91 mg/dL   Total CHOL/HDL Ratio 3.6 RATIO   VLDL 34 0 - 40 mg/dL   LDL Cholesterol 66 0 - 99 mg/dL  Glucose, capillary     Status: Abnormal   Collection Time: 11/02/17  6:51 AM  Result Value Ref Range   Glucose-Capillary 158 (H) 65 - 99 mg/dL  Glucose, capillary     Status: Abnormal   Collection Time: 11/02/17 10:43 AM  Result Value Ref Range   Glucose-Capillary 191 (H) 65 - 99 mg/dL   Ct Head Wo Contrast  Result Date: 11/01/2017 CLINICAL DATA:  Dizzy, status post fall, slurred speech EXAM: CT HEAD WITHOUT CONTRAST TECHNIQUE: Contiguous axial images were obtained from the base of the skull through the vertex without intravenous contrast. COMPARISON:  None. FINDINGS: Brain: No evidence of acute infarction, hemorrhage, extra-axial collection, ventriculomegaly, or mass effect. Generalized cerebral atrophy. Periventricular white matter low attenuation likely secondary to microangiopathy. Vascular: Cerebrovascular atherosclerotic calcifications are noted. Skull: Negative for fracture or focal lesion. Sinuses/Orbits: Visualized portions of the orbits are unremarkable. Mastoid sinuses are clear. Left maxillary sinus mucosal thickening. Other: None. IMPRESSION: No acute intracranial pathology. Electronically Signed   By: Elige Ko   On: 11/01/2017 11:00   Mr Brain Wo Contrast  Result Date: 11/01/2017 CLINICAL DATA:  Initial evaluation for acute altered mental status. EXAM: MRI HEAD WITHOUT CONTRAST TECHNIQUE: Multiplanar, multiecho pulse sequences of the brain and surrounding structures were obtained without intravenous contrast. COMPARISON:  Comparison made with prior CT from earlier the same day. FINDINGS: Brain: Generalized age appropriate cerebral atrophy. Patchy and confluent T2/FLAIR hyperintensity within the periventricular and deep white matter both  cerebral hemispheres, most consistent with chronic small vessel ischemic disease, mild in nature. Patchy small volume abnormal restricted diffusion seen involving the superior left cerebellar hemisphere, consistent with acute ischemic infarct (series 3, image 17). Finding involves the left superior cerebellar artery territory. Involvement of the left cerebellar vermis. No significant mass effect. Associated small volume petechial hemorrhage without hemorrhagic transformation (series 9, image 25). No other evidence for acute or subacute ischemia. No encephalomalacia to suggest chronic cortical infarction. No other evidence for acute or chronic intracranial hemorrhage. No mass lesion, midline shift or mass effect. No hydrocephalus. No extra-axial fluid collection. Major dural sinuses are grossly patent. Pituitary gland suprasellar region normal. Midline structures intact and normal. Vascular: Major intracranial vascular flow voids are maintained at the skull base. Skull and upper cervical spine: Craniocervical junction normal. Upper cervical spine normal. Bone marrow signal intensity within normal limits. Mild hyperostosis frontalis interna noted. No scalp soft tissue abnormality. Sinuses/Orbits: Globes and orbital soft tissues within normal limits. Axial myopia noted. Mild  scattered mucosal thickening within the ethmoidal air cells and maxillary sinuses. Paranasal sinuses are otherwise clear. No mastoid effusion. Inner ear structures normal. Other: None IMPRESSION: 1. 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 Associated small volume petechial hemorrhage without hemorrhagic frank transformation. No significant mass effect. 2. No other acute intracranial abnormality. 3. Mild for age chronic small vessel ischemic disease. Electronically Signed   By: Rise Mu M.D.   On: 11/01/2017 16:45    Assessment/Plan: Diagnosis: acute ischemic infarct  in left cerebellum  Labs and images independently reviewed.  Records reviewed and summated above. Stroke: Continue secondary stroke prophylaxis and Risk Factor Modification listed below:   Blood Pressure Management:  Continue current medication with prn's with permisive HTN per primary team Statin Agent:   Diabetes management:   Right sided hemiparesis: Motor recovery: Fluoxetine  1. Does the need for close, 24 hr/day medical supervision in concert with the patient's rehab needs make it unreasonable for this patient to be served in a less intensive setting? Potentially  2. Co-Morbidities requiring supervision/potential complications: HTN (monitor and provide prns in accordance with increased physical exertion and pain), T2 DM (Monitor in accordance with exercise and adjust meds as necessary), Ventricular Tachycardia (cont meds, monitor in accordance with pain and increasing activity), leukocytosis (cont to monitor for signs and symptoms of infection, further workup if indicated), hyponatremia (cont to monitor, treat as necessary) 3. Due to safety, disease management and patient education, does the patient require 24 hr/day rehab nursing? Potentially 4. Does the patient require coordinated care of a physician, rehab nurse, PT (1-2 hrs/day, 5 days/week) and OT (1-2 hrs/day, 5 days/week) to address physical and functional deficits in the context of the above medical diagnosis(es)? Potentially Addressing deficits in the following areas: balance, endurance, locomotion, strength, transferring, bathing, dressing, toileting and psychosocial support 5. Can the patient actively participate in an intensive therapy program of at least 3 hrs of therapy per day at least 5 days per week? Yes 6. The potential for patient to make measurable gains while on inpatient rehab is good 7. Anticipated functional outcomes upon discharge from inpatient rehab are modified independent  with PT, modified independent with OT, n/a  with SLP. 8. Estimated rehab length of stay to reach the above functional goals is: 7-12 days. 9. Anticipated D/C setting: Home 10. Anticipated post D/C treatments: HH therapy and Home excercise program 11. Overall Rehab/Functional Prognosis: good  RECOMMENDATIONS: This patient's condition is appropriate for continued rehabilitative care in the following setting: Will await reeval by therapies.  If functional deficits persist, recommend CIR. Patient has agreed to participate in recommended program. Yes Note that insurance prior authorization may be required for reimbursement for recommended care.  Comment: Rehab Admissions Coordinator to follow up.  Maryla Morrow, MD, ABPMR Jacquelynn Cree, PA-C 11/02/2017

## 2017-11-03 ENCOUNTER — Ambulatory Visit (HOSPITAL_COMMUNITY): Admit: 2017-11-03 | Payer: PPO | Admitting: Cardiology

## 2017-11-03 ENCOUNTER — Inpatient Hospital Stay (HOSPITAL_COMMUNITY): Payer: PPO

## 2017-11-03 ENCOUNTER — Encounter (HOSPITAL_COMMUNITY): Payer: Self-pay | Admitting: *Deleted

## 2017-11-03 ENCOUNTER — Encounter (HOSPITAL_COMMUNITY)
Admission: EM | Disposition: A | Discharge status: Skilled Nursing Facility | Payer: Self-pay | Source: Home / Self Care | Attending: Family Medicine

## 2017-11-03 DIAGNOSIS — I6389 Other cerebral infarction: Secondary | ICD-10-CM

## 2017-11-03 DIAGNOSIS — Z01810 Encounter for preprocedural cardiovascular examination: Secondary | ICD-10-CM

## 2017-11-03 DIAGNOSIS — I340 Nonrheumatic mitral (valve) insufficiency: Secondary | ICD-10-CM

## 2017-11-03 HISTORY — PX: TEE WITHOUT CARDIOVERSION: SHX5443

## 2017-11-03 HISTORY — PX: LOOP RECORDER INSERTION: EP1214

## 2017-11-03 LAB — GLUCOSE, CAPILLARY
GLUCOSE-CAPILLARY: 130 mg/dL — AB (ref 65–99)
GLUCOSE-CAPILLARY: 204 mg/dL — AB (ref 65–99)
GLUCOSE-CAPILLARY: 134 mg/dL — AB (ref 65–99)
Glucose-Capillary: 123 mg/dL — ABNORMAL HIGH (ref 65–99)

## 2017-11-03 SURGERY — ECHOCARDIOGRAM, TRANSESOPHAGEAL
Anesthesia: Moderate Sedation

## 2017-11-03 SURGERY — LOOP RECORDER INSERTION

## 2017-11-03 MED ORDER — BUTAMBEN-TETRACAINE-BENZOCAINE 2-2-14 % EX AERO
INHALATION_SPRAY | CUTANEOUS | Status: DC | PRN
  Administered 2017-11-03: 2 via TOPICAL

## 2017-11-03 MED ORDER — MIDAZOLAM HCL 5 MG/ML IJ SOLN
INTRAMUSCULAR | Status: AC
  Filled 2017-11-03: qty 2

## 2017-11-03 MED ORDER — FENTANYL CITRATE (PF) 100 MCG/2ML IJ SOLN
INTRAMUSCULAR | Status: DC | PRN
  Administered 2017-11-03: 25 ug via INTRAVENOUS

## 2017-11-03 MED ORDER — MIDAZOLAM HCL 5 MG/5ML IJ SOLN
INTRAMUSCULAR | Status: DC | PRN
  Administered 2017-11-03: 2 mg via INTRAVENOUS

## 2017-11-03 MED ORDER — SODIUM CHLORIDE 0.9 % IV SOLN
INTRAVENOUS | Status: AC | PRN
  Administered 2017-11-03: 500 mL via INTRAVENOUS

## 2017-11-03 MED ORDER — DIPHENHYDRAMINE HCL 50 MG/ML IJ SOLN
INTRAMUSCULAR | Status: AC
  Filled 2017-11-03: qty 1

## 2017-11-03 MED ORDER — FENTANYL CITRATE (PF) 100 MCG/2ML IJ SOLN
INTRAMUSCULAR | Status: AC
  Filled 2017-11-03: qty 2

## 2017-11-03 MED ORDER — LIDOCAINE-EPINEPHRINE 1 %-1:100000 IJ SOLN
INTRAMUSCULAR | Status: DC | PRN
  Administered 2017-11-03: 30 mL

## 2017-11-03 SURGICAL SUPPLY — 2 items
LOOP REVEAL LINQSYS (Prosthesis & Implant Heart) ×3 IMPLANT
PACK LOOP INSERTION (CUSTOM PROCEDURE TRAY) ×3 IMPLANT

## 2017-11-03 NOTE — Evaluation (Signed)
Occupational Therapy Evaluation Patient Details Name: Yvonne Newton MRN: [id number] DOB: August 29, 1941 Today's Date: 11/03/2017    History of Present Illness Yvonne Mayweather McKinneyis an 77 y.o.femaleHTN, HLD, DM and Hx ventricular Tachycardia on Flecainide who presents to the ED with acute onset dizziness, ataxia and dysarthria. 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   Clinical Impression   Pt currently presents at a min assist level for functional transfers to the toilet and for dynamic standing balance as it relates to selfcare tasks.  She also demonstrates decreased gross and FM coordination in the left arm and hand but can use it functionally as an active assist for selfcare tasks at this time.  Feel she will benefit from acute care OT to further progress ADL function and balance.  Feel short term CIR is also best option in order to reach modified independent level for home as she does not have 24 hour supervision.  Will continue to follow.    Follow Up Recommendations  CIR;Supervision - Intermittent    Equipment Recommendations  3 in 1 bedside commode;Tub/shower bench    Recommendations for Other Services Rehab consult     Precautions / Restrictions Precautions Precautions: Fall      Mobility Bed Mobility Overal bed mobility: Needs Assistance Bed Mobility: Supine to Sit;Sit to Supine     Supine to sit: Min guard Sit to supine: Min guard      Transfers Overall transfer level: Needs assistance Equipment used: 1 person hand held assist Transfers: Sit to/from Stand Sit to Stand: Min assist         General transfer comment: Pt unsteady, needed min assist for mobility to and from the bathroom with hand held assist or therapist guidance without UE support.     Balance Overall balance assessment: Needs assistance Sitting-balance support: Feet supported;No upper extremity supported Sitting balance-Leahy Scale: Good       Standing balance-Leahy Scale:  Fair Standing balance comment: LOB with functional mobility and static standing against resistance.                            ADL either performed or assessed with clinical judgement   ADL Overall ADL's : Needs assistance/impaired Eating/Feeding: Set up;Sitting   Grooming: Wash/dry hands;Wash/dry face;Minimal assistance;Standing   Upper Body Bathing: Supervision/ safety;Sitting   Lower Body Bathing: Minimal assistance;Sit to/from stand   Upper Body Dressing : Minimal assistance;Sitting   Lower Body Dressing: Minimal assistance;Sit to/from stand   Toilet Transfer: Minimal assistance;Comfort height toilet;Ambulation   Toileting- Clothing Manipulation and Hygiene: Minimal assistance;Sit to/from stand   Tub/ Shower Transfer: Minimal assistance   Functional mobility during ADLs: Minimal assistance General ADL Comments: Pt demonstrates overall weakness on the left side with decreased dynamic and static standing balance.  LOB posteriorly in standing when resistance is applied.  She can use the LUE for selfcare tasks but at a gross assist level, with decreased FM and gross motor coordination.  Decreased ability to manipulate small top and place on soap bottle.  Would benefit from CIR level short stay to reach modified independent level as she lives with her children but they are only available intermittently.     Vision Baseline Vision/History: Wears glasses;Cataracts Wears Glasses: At all times Patient Visual Report: Other (comment)(reports vision being cloudy) Vision Assessment?: Yes Eye Alignment: Within Functional Limits Ocular Range of Motion: Within Functional Limits Alignment/Gaze Preference: Within Defined Limits Tracking/Visual Pursuits: Decreased smoothness of horizontal tracking;Decreased smoothness  of vertical tracking Convergence: Within functional limits Visual Fields: No apparent deficits            Pertinent Vitals/Pain Pain Assessment: No/denies pain      Hand Dominance  right   Extremity/Trunk Assessment Upper Extremity Assessment Upper Extremity Assessment: LUE deficits/detail LUE Deficits / Details: Strength 4/5 throughout, demonstrates decreased FM coordination as well as finger to nose testing.  Decreased ability to place small top on soap but could remove it with the LUE.  Uses the UE at a active assist level at this time.  LUE Sensation: decreased light touch LUE Coordination: decreased fine motor           Communication Communication Communication: No difficulties   Cognition Arousal/Alertness: Awake/alert Behavior During Therapy: WFL for tasks assessed/performed Overall Cognitive Status: Within Functional Limits for tasks assessed                                                Home Living Family/patient expects to be discharged to:: Private residence Living Arrangements: Children Available Help at Discharge: Family;Available PRN/intermittently(per pt 24/7, per son intermittent) Type of Home: House Home Access: Ramped entrance     Home Layout: One level;Full bath on main level     Bathroom Shower/Tub: Tub/shower unit   Bathroom Toilet: Standard(and handicapped height)     Home Equipment: Walker - standard;Grab bars - toilet;Grab bars - tub/shower          Prior Functioning/Environment Level of Independence: Independent                 OT Problem List: Decreased strength;Impaired balance (sitting and/or standing);Decreased coordination;Impaired sensation;Impaired UE functional use;Decreased knowledge of use of DME or AE      OT Treatment/Interventions: Self-care/ADL training;Neuromuscular education;Patient/family education;Therapeutic activities;DME and/or AE instruction;Balance training    OT Goals(Current goals can be found in the care plan section) Acute Rehab OT Goals Patient Stated Goal: Pt wants to get her left hand working better. OT Goal Formulation: With  patient/family Time For Goal Achievement: 11/17/17 Potential to Achieve Goals: Good  OT Frequency: Min 2X/week   Barriers to D/C: Decreased caregiver support             AM-PAC PT "6 Clicks" Daily Activity     Outcome Measure Help from another person eating meals?: None Help from another person taking care of personal grooming?: A Little Help from another person toileting, which includes using toliet, bedpan, or urinal?: A Little Help from another person bathing (including washing, rinsing, drying)?: A Little Help from another person to put on and taking off regular upper body clothing?: A Little Help from another person to put on and taking off regular lower body clothing?: A Little 6 Click Score: 19   End of Session Nurse Communication: Mobility status  Activity Tolerance: Patient tolerated treatment well Patient left: in bed;with call bell/phone within reach;with family/visitor present  OT Visit Diagnosis: Unsteadiness on feet (R26.81);Hemiplegia and hemiparesis;Muscle weakness (generalized) (M62.81) Hemiplegia - Right/Left: Left Hemiplegia - dominant/non-dominant: Non-Dominant Hemiplegia - caused by: Cerebral infarction                Time: 1610-9604: 0954-1023 OT Time Calculation (min): 29 min Charges:  OT Evaluation $OT Eval Moderate Complexity: 1 Mod OT Treatments $Self Care/Home Management : 8-22 mins  Yvonne Newton OTR/L 11/03/2017, 10:48 AM

## 2017-11-03 NOTE — Progress Notes (Signed)
STROKE TEAM PROGRESS NOTE   HPI   Yvonne Newton is an 77 y.o. 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 Daughters at bedside say the patients symptoms started at approximately 6:15 AM and worsened throughout the day. Per her daughters she fell at home today onto her left side.  MRI Head reveals: Acute Left cerebral artery infarct. Patient takes no ASA or statin at home. She is compliant with all of her other medications.  Patient states she has had a "nagging" left side H/A for the past few weeks with associated "eye stain". The patient also admits to very poor sleep (less than 4 hours per night) for the past month. She denies any H/A or visual changes on exam today.  Date last known well: Date: 11/01/2017 Time last known well: Time: 06:15 tPA Given: No: outside the window Modified Rankin: Rankin Score=0 NIHSS: 1    SUBJECTIVE (INTERVAL HISTORY) Her son is at the bedside.  She notes improvement in dizziness. TEE showed a small PFO but lower extremity venous Dopplers were negative for DVT. Loop recorder is pending. She is awaiting transfer to rehabilitation when bed available   OBJECTIVE Vitals:   11/03/17 0835 11/03/17 0850 11/03/17 0900 11/03/17 1131  BP: (!) 168/51 (!) 155/46 (!) 148/48 (!) 159/44  Pulse: 63 62 62 65  Resp: 14 14 15 18   Temp: 98.3 F (36.8 C)   98.3 F (36.8 C)  TempSrc: Oral   Oral  SpO2: 96% 98% 97% 98%  Weight:      Height:        CBC:  Recent Labs  Lab 11/01/17 0917  WBC 14.5*  NEUTROABS 10.8*  HGB 14.4  HCT 43.3  MCV 89.3  PLT 236    Basic Metabolic Panel:  Recent Labs  Lab 11/01/17 0917  NA 134*  K 3.9  CL 94*  CO2 23  GLUCOSE 339*  BUN 21*  CREATININE 0.71  CALCIUM 9.2    Lipid Panel:     Component Value Date/Time   CHOL 139 11/02/2017 0502   TRIG 170 (H) 11/02/2017 0502   HDL 39 (L) 11/02/2017 0502   CHOLHDL 3.6  11/02/2017 0502   VLDL 34 11/02/2017 0502   LDLCALC 66 11/02/2017 0502   HgbA1c:  Lab Results  Component Value Date   HGBA1C 7.6 (H) 11/02/2017   Urine Drug Screen: No results found for: LABOPIA, COCAINSCRNUR, LABBENZ, AMPHETMU, THCU, LABBARB  Alcohol Level No results found for: ETH  IMAGING  Ct Head Wo Contrast 11/01/2017 No acute intracranial pathology.   Mr Brain Wo Contrast 11/01/2017 1. 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 Associated small volume petechial hemorrhage without hemorrhagic frank transformation. No significant mass effect. 2. No other acute intracranial abnormality. 3. Mild for age chronic small vessel ischemic disease.    PHYSICAL EXAM Pleasant elderly obese african [place] lady currently not in distress. . Afebrile. Head is nontraumatic. Neck is supple without bruit.    Cardiac exam no murmur or gallop. Lungs are clear to auscultation. Distal pulses are well felt. Neurological Exam ;  Awake  Alert oriented x 3. Normal speech and language.eye movements full without nystagmus.fundi were not visualized. Vision acuity and fields appear normal. Hearing is normal. Palatal movements are normal. Face symmetric. Tongue midline. Normal strength, tone, reflexes and coordination. Normal sensation. Gait deferred.  ASSESSMENT/PLAN Yvonne Newton is  a 77 y.o. 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 She did not receive IV t-PA due to delay in arrival.   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 Await CTA to help confirm source.   Resultant  Dizziness & imbalance  CT head no acute abnormality  MRI head small L SCA infarct w/ sm vol petechial hmg. Small vessel disease.  CTA head & neck , left superior  cerebellar infarct. R ICA 35% stenosis. R VA 80% stenosis with minimal contribution to BA. L SCA open. No significant anterior intracranial stenosis  Carotid Doppler   There is 1-39% bilateral ICA stenosis. Vertebral artery flow is antegrade.   2D Echo  Left ventricle: The cavity size was normal. There was moderate   concentric hypertrophy. Systolic function was vigorous. The   estimated ejection fraction was in the range of 65% to 70%. Wall   motion was normal; there were no regional wall motion   abnormalities. There was an increased relative contribution of    atrial contraction to ventricular filling.  TEE to look for embolic source. Arranged with [facility] for tomorrow.  If positive for PFO (patent foramen ovale), check bilateral lower extremity venous dopplers to rule out DVT as possible source of stroke. (I have made patient NPO after midnight tonight).   LDL 66  HgbA1c 7.6  SCDs for VTE prophylaxis Fall precautions Diet heart healthy/carb modified Room service appropriate? Yes; Fluid consistency: Thin  No antithrombotic prior to admission, now on aspirin 325 mg daily (plan discharge on aspirin 81 mg daily)  Patient counseled to be compliant with her antithrombotic medications  Ongoing aggressive stroke risk factor management  Therapy recommendations:  CIR,   Disposition:  CLR Hypertension  Stable  Slight elevation treated as OP by cardiology  Permissive hypertension (OK if < 220/120) but gradually normalize in 5-7 days  Long-term BP goal normotensive  Hyperlipidemia  Home meds:  None  lipitor 40 mg daily started in the hospital  LDL 66, at goal < 70  Continue statin at discharge  Diabetes 2  HgbA1c 7.6, goal < 7.0  Uncontrolled  Other Stroke Risk Factors  Advanced age  Former  Cigarette smoker, quit about 19 years ago   Obesity, Body mass index is 35.94 kg/m., recommend weight loss, diet and exercise as appropriate   Other  Active Problems  Hx VT on flecainide   Leukocytosis, 14.5 felt to be reactive  Hospital day # 2   I have personally examined this patient, reviewed notes, independently viewed imaging studies, participated in medical decision making and plan of care.ROS completed by me personally and pertinent positives fully documented  I have made any additions or clarifications directly to the above note.  . She presented with dizziness secondary to left superior cerebellar artery embolic infarct etiology to be determined. Recommend   and loop recorder. Stroke team will sign off. Follow-up as an outpatient in stroke clinic in 6 weeks. Currently call for questions if any.  Long discussion with patient and answered questions about her stroke and evaluation and treatment plan. Greater than 50% time during this 25 minute visit was spent on counseling and coordination of care about her embolic stroke and answering questions  Delia Heady, MD Medical Director [facility] Pager: [phone] 11/03/2017 2:56 PM  To contact Stroke Continuity  provider, please refer to [email or web address] After hours, contact General Neurology

## 2017-11-03 NOTE — Evaluation (Signed)
Speech Language Pathology Evaluation Patient Details Name: Yvonne Newton MRN: [id number] DOB: February 24, 1941 Today's Date: 11/03/2017 Time: 1040-1100 SLP Time Calculation (min) (ACUTE ONLY): 20 min  Problem List:  Patient Active Problem List   Diagnosis Date Noted  . Cerebellar stroke (HCC)   . Dysarthria   . Diabetes mellitus type 2 in nonobese (HCC)   . Leukocytosis   . Hyponatremia   . 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 Allergic state 07/22/2017  . Arthritis 07/22/2017  . Diabetes mellitus type 2, uncomplicated (HCC) 07/22/2017  . Hyperlipidemia, unspecified 07/22/2017  . Essential hypertension 07/22/2017  . Tachycardia 07/22/2017  . OBESITY 09/05/2009  . VENTRICULAR TACHYCARDIA 09/05/2009   Past Medical History:  Past Medical History:  Diagnosis Date  . DM (diabetes mellitus) (HCC)   . OA (osteoarthritis) of knee    right  . Obesity   . Ventricular tachycardia (HCC)   . Vitamin B 12 deficiency    Past Surgical History:  Past Surgical History:  Procedure Laterality Date  . BREAST BIOPSY Left 2010   Negative   HPI:  Yvonne Newton a 77 y.o.femalewith a past medical history significant for NIDDM, HTN, and ventricular tachycardias on flecainidewho presents with ataxia, vertigo. All history collected from EDP and family by phone, as the patient is sedated by Lorazepamand promethazine. Evidently, the patient was in her usual state of health until this morning, she awoke with vertigo, difficulty walking. Her symptoms persisted for over an hour and so she came to the emergency room. MRI revealed small acute ischemic left superior cerebral artery territory with Subacute infarct left superior cerebellum similar to MRI yesterday   Assessment / Plan / Recommendation Clinical Impression  Pt presents with mild to moderate cognitive impairments as supported by score of 23 out 30 on MOCA (n=>26). Pt with deficits in memory (storage, retrieveal and recall of new  information), basic problem solving, selective attention and emergent/anticipatory awareness. Pt with no self-monitoring or attempts to self-correct responses. Pt required Mod A to complete basic problem solving tasks. In addition to the above mentioned cognitive deficits, pt also demonstrates mild dysarthria c/b imprecise articulation. Pt's speech intelligibility is reduced to ~ 70% at the simple conversation level within quiet environment. Recommend skilled ST to target above mentioned deficits and increase pt's functional independence. ST to follow and recommend CIR for follow up services.     SLP Assessment  SLP Recommendation/Assessment: Patient needs continued Speech Lanaguage Pathology Services SLP Visit Diagnosis: Dysarthria and anarthria (R47.1);Cognitive communication deficit (R41.841)    Follow Up Recommendations  Inpatient Rehab    Frequency and Duration min 2x/week  2 weeks      SLP Evaluation Cognition  Overall Cognitive Status: Impaired/Different from baseline Arousal/Alertness: Awake/alert Orientation Level: Oriented X4 Attention: Selective Selective Attention: Impaired Selective Attention Impairment: Verbal basic;Functional basic Memory: Impaired Memory Impairment: Retrieval deficit;Decreased recall of new information;Decreased short term memory Decreased Short Term Memory: Verbal basic;Functional basic Awareness: Impaired Awareness Impairment: Emergent impairment;Anticipatory impairment Problem Solving: Impaired Problem Solving Impairment: Verbal basic;Functional basic Executive Function: Reasoning Reasoning: Impaired Reasoning Impairment: Verbal basic;Functional basic Safety/Judgment: Impaired       Comprehension  Auditory Comprehension Overall Auditory Comprehension: Appears within functional limits for tasks assessed Visual Recognition/Discrimination Discrimination: Within Function Limits Reading Comprehension Reading Status: Within funtional limits     Expression Expression Primary Mode of Expression: Verbal Verbal Expression Overall Verbal Expression: Appears within functional limits for tasks assessed Written Expression Dominant Hand: Right Written Expression: Within Functional Limits   Oral /  Motor  Oral Motor/Sensory Function Overall Oral Motor/Sensory Function: Mild impairment Motor Speech Overall Motor Speech: Impaired Respiration: Within functional limits Phonation: Normal Resonance: Within functional limits Articulation: Impaired Level of Impairment: Phrase Intelligibility: Intelligibility reduced Word: 75-100% accurate Phrase: 75-100% accurate Sentence: 75-100% accurate Conversation: 50-74% accurate Motor Planning: Witnin functional limits Motor Speech Errors: Not applicable Effective Techniques: Over-articulate   GO                    Valary Manahan 11/03/2017, 11:16 AM

## 2017-11-03 NOTE — Progress Notes (Signed)
Bilateral lower extremity venous duplex completed. No evidence of DVT or superficial thrombosis. There is no evidence of a Baker's cyst on the right and positive for a Baker's cyst on the left. Graybar ElectricVirginia Cherrell Maybee, RVS 11/03/2017 2:07 PM

## 2017-11-03 NOTE — Progress Notes (Signed)
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    PT Comments    Pt progressing towards all goals however remains to demo impulsivity, impaired balance, staggering gait pattern and has increased falls risk. Pt very motivated to return to indep and is a "go getter." Pt remains appropriate for CIR upon d/c as she demo's excellent rehab potential.   Follow Up Recommendations  CIR     Equipment Recommendations  None recommended by PT    Recommendations for Other Services Rehab consult     Precautions / Restrictions Precautions Precautions: Fall Precaution Comments: change in vision,  Restrictions Weight Bearing Restrictions: No    Mobility  Bed Mobility Overal bed mobility: Needs Assistance Bed Mobility: Supine to Sit     Supine to sit: Min guard Sit to supine: Min guard   General bed mobility comments: HOB elevated, able to bring LEs off EOB  Transfers Overall transfer level: Needs assistance Equipment used: 1 person hand held assist Transfers: Sit to/from Stand Sit to Stand: Min assist         General transfer comment: pt remains to be unsteady upon standing with significant lateral sway L/R  Ambulation/Gait Ambulation/Gait assistance: Min assist;Mod assist Ambulation Distance (Feet): 150 Feet Assistive device: 1 person hand held assist Gait Pattern/deviations: Step-through pattern;Decreased step length - right;Decreased step length - left;Staggering left;Staggering right;Wide base of support Gait velocity: impulsively fast   General Gait Details: pt progressively will gain speed and trip forward requiring min/modA to prevent fall. Pt  unable to turn head L/R  without LOB. Pt required modAx1 to prevent fall x 4 t/o 150' ambulation session.    Stairs            Wheelchair Mobility    Modified Rankin (Stroke Patients Only) Modified Rankin (Stroke Patients Only) Pre-Morbid Rankin Score: No symptoms Modified Rankin: Slight disability     Balance Overall balance assessment: Needs assistance Sitting-balance support: Feet supported;No upper extremity supported Sitting balance-Leahy Scale: Good     Standing balance support: Bilateral upper extremity supported Standing balance-Leahy Scale: Fair Standing balance comment: LOB with functional mobility and static standing against resistance.                             Cognition Arousal/Alertness: Awake/alert Behavior During Therapy: WFL for tasks assessed/performed Overall Cognitive Status: Impaired/Different from baseline Area of Impairment: Problem solving;Safety/judgement                         Safety/Judgement: Decreased awareness of safety;Decreased awareness of deficits   Problem Solving: Slow processing;Requires verbal cues;Requires tactile cues        Exercises      General Comments        Pertinent Vitals/Pain Pain Assessment: 0-10 Pain Score: 3  Pain Location: L sided headache Pain Descriptors / Indicators: Headache Pain Intervention(s): Monitored during session    Home Living Family/patient expects to be discharged to:: Private residence Living Arrangements: Children Available Help at Discharge: Family;Available PRN/intermittently Type of Home: House Home Access: Ramped entrance   Home Layout: One level;Full bath on main level Home Equipment: Walker - standard;Grab bars -  toilet;Grab bars - tub/shower      Prior Function Level of Independence: Independent          PT Goals (current goals can now be found in the care plan section) Acute Rehab PT Goals Patient Stated Goal: Pt wants to get her left hand  working better. Progress towards PT goals: Progressing toward goals    Frequency    Min 4X/week      PT Plan Current plan remains appropriate    Co-evaluation              AM-PAC PT "6 Clicks" Daily Activity  Outcome Measure  Difficulty turning over in bed (including adjusting bedclothes, sheets and blankets)?: None Difficulty moving from lying on back to sitting on the side of the bed? : Unable Difficulty sitting down on and standing up from a chair with arms (e.g., wheelchair, bedside commode, etc,.)?: Unable Help needed moving to and from a bed to chair (including a wheelchair)?: A Lot Help needed walking in hospital room?: A Lot Help needed climbing 3-5 steps with a railing? : A Lot 6 Click Score: 12    End of Session Equipment Utilized During Treatment: Gait belt Activity Tolerance: Patient tolerated treatment well Patient left: in chair;with call bell/phone within reach;with chair alarm set;with family/visitor present Nurse Communication: Mobility status PT Visit Diagnosis: Unsteadiness on feet (R26.81);Other abnormalities of gait and mobility (R26.89);Repeated falls (R29.6);Muscle weakness (generalized) (M62.81);History of falling (Z91.81);Difficulty in walking, not elsewhere classified (R26.2)     Time: [id number] PT Time Calculation (min) (ACUTE ONLY): 22 min  Charges:  $Gait Training: 8-22 mins                    G Codes:       Lewis ShockAshly Anvika Gashi, PT, DPT Pager #: [phone] Office #: [phone]    [name] Likes 11/03/2017, 12:58 PM

## 2017-11-03 NOTE — Consult Note (Addendum)
ELECTROPHYSIOLOGY CONSULT NOTE  Patient ID: Yvonne Newton MRN: [id number], DOB/AGE: [birth year]   Admit date: 11/01/2017 Date of Consult: 11/03/2017  Primary Physician: Jerl Mina, MD Primary Cardiologist: Dr. Ladona Ridgel Reason for Consultation: Cryptogenic stroke ; recommendations regarding Implantable Loop Recorder, requested by Dr. Pearlean Brownie  History of Present Illness [name] was admitted on 11/01/2017 with stroke.  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  She first developed symptoms while at home.  Imaging demonstrated left superior cerelbellar infarct w/ petechial hemorrhage, in setting of hx VT, infarct likely embolic secondary to unknown source.   she has undergone workup for stroke including echocardiogram and carotid angio.  The patient has been monitored on telemetry which has demonstrated sinus rhythm with no arrhythmias.  Inpatient stroke work-up is to be completed with a TEE.   Echocardiogram this admission demonstrated Study Conclusions  - Left ventricle: The cavity size was normal. There was moderate   concentric hypertrophy. Systolic function was vigorous. The   estimated ejection fraction was in the range of 65% to 70%. Wall   motion was normal; there were no regional wall motion   abnormalities. There was an increased relative contribution of   atrial contraction to ventricular filling. Doppler parameters are   consistent with abnormal left ventricular relaxation (grade 1   diastolic dysfunction). - Aortic valve: There was mild regurgitation. - Mitral valve: Valve area by pressure half-time: 2.5 cm^2. - Tricuspid valve: There was trivial regurgitation. - Pericardium, extracardiac: A trivial, free-flowing pericardial   effusion was identified along the right ventricular free wall.  Lab work is reviewed.  Prior to admission, the patient denies chest pain, shortness of breath, dizziness, palpitations, or syncope.  They are recovering from  their stroke with plans to likely rehab at discharge.   Past Medical History:  Diagnosis Date  . DM (diabetes mellitus) (HCC)   . OA (osteoarthritis) of knee    right  . Obesity   . Ventricular tachycardia (HCC)   . Vitamin B 12 deficiency      Surgical History:  Past Surgical History:  Procedure Laterality Date  . BREAST BIOPSY Left 2010   Negative     Medications Prior to Admission  Medication Sig Dispense Refill Last Dose  . amLODipine (NORVASC) 2.5 MG tablet Take 2.5 mg by mouth 2 (two) times daily.   11/01/2017 at am  . CYANOCOBALAMIN IJ Inject as directed every 30 (thirty) days. On or about the 26th of each month   10/27/2017  . EPINEPHrine (EPIPEN 2-PAK) 0.3 mg/0.3 mL IJ SOAJ injection Inject 0.3 mg into the muscle once as needed (severe allergic reaction).   not yet used  . famotidine (PEPCID) 20 MG tablet Take 1 tablet (20 mg total) by mouth 2 (two) times daily. 10 tablet 0 11/01/2017 at am  . flecainide (TAMBOCOR) 50 MG tablet TAKE 1 TABLET (50 MG TOTAL) BY MOUTH 2 (TWO) TIMES DAILY. 60 tablet 2 11/01/2017 at am  . meloxicam (MOBIC) 15 MG tablet Take 15 mg by mouth daily.   11/01/2017 at am  . metFORMIN (GLUCOPHAGE-XR) 500 MG 24 hr tablet Take 500-1,000 mg by mouth See admin instructions. Take one tablet (500 mg) by mouth every morning and two tablets (1000 mg) at bedtime   11/01/2017 at am  . omeprazole (PRILOSEC) 20 MG capsule Take 20 mg by mouth daily.    11/01/2017 at am  . predniSONE (DELTASONE) 20 MG tablet Take 3 tablets (60 mg total) by  mouth daily. (Patient taking differently: Take 20-40 mg by mouth See admin instructions. Started 10/27/17  - 5 day course: take 2 tablets (40 mg) by mouth every morning and 1 tablet (20 mg) at night) 15 tablet 0 11/01/2017 at am  . Vitamin D, Ergocalciferol, (DRISDOL) 50000 units CAPS capsule Take 50,000 Units by mouth every 7 (seven) days.   unknown  . metoprolol tartrate (LOPRESSOR) 50 MG tablet Take 50 mg by mouth 2 (two) times daily.   3/3? at am  - unknown time    Inpatient Medications:  . aspirin  300 mg Rectal Daily   Or  . aspirin  325 mg Oral Daily  . atorvastatin  40 mg Oral q1800  . flecainide  50 mg Oral Q12H  . insulin aspart  0-15 Units Subcutaneous TID WC  . insulin aspart  0-5 Units Subcutaneous QHS  . insulin glargine  8 Units Subcutaneous QHS  . metoprolol tartrate  50 mg Oral BID    Allergies:  Allergies  Allergen Reactions  . Blueberry Flavor Hives and Shortness Of Breath  . Sulfa Antibiotics Itching  . Sulfonamide Derivatives Itching  . Other Swelling and Rash    METAL    Social History   Socioeconomic History  . Marital status: Married    Spouse name: Not on file  . Number of children: Not on file  . Years of education: Not on file  . Highest education level: Not on file  Social Needs  . Financial resource strain: Not on file  . Food insecurity - worry: Not on file  . Food insecurity - inability: Not on file  . Transportation needs - medical: Not on file  . Transportation needs - non-medical: Not on file  Occupational History  . Not on file  Tobacco Use  . Smoking status: Former Smoker    Last attempt to quit: 09/01/1998    Years since quitting: 19.1  . Smokeless tobacco: Never Used  Substance and Sexual Activity  . Alcohol use: No    Frequency: Never  . Drug use: No  . Sexual activity: Not on file  Other Topics Concern  . Not on file  Social History Narrative  . Not on file     Family History  Problem Relation Age of Onset  . Heart attack Father 59  . Heart attack Brother 48  . Heart attack Brother        had CABG  . Breast cancer Neg Hx       Review of Systems: All other systems reviewed and are otherwise negative except as noted above.  Physical Exam: Vitals:   11/03/17 0828 11/03/17 0835 11/03/17 0850 11/03/17 0900  BP: (!) 172/56 (!) 168/51 (!) 155/46 (!) 148/48  Pulse: 63 63 62 62  Resp: 16 14 14 15   Temp:  98.3 F (36.8 C)    TempSrc:  Oral    SpO2: 91% 96%  98% 97%  Weight:      Height:        GEN- The patient is well appearing, sleepy, though easily woken, alert and oriented x 3 today.   Head- normocephalic, atraumatic Eyes-  Sclera clear, conjunctiva pink Ears- hearing intact Oropharynx- clear Neck- supple Lungs- CTA b/l, normal work of breathing Heart- RRR, no murmurs, rubs or gallops  GI- soft, NT, ND Extremities- no clubbing, cyanosis, or edema MS- no significant deformity or atrophy Skin- no rash or lesion Psych- euthymic mood, full affect   Labs:   Lab  Results  Component Value Date   WBC 14.5 (H) 11/01/2017   HGB 14.4 11/01/2017   HCT 43.3 11/01/2017   MCV 89.3 11/01/2017   PLT 236 11/01/2017    Recent Labs  Lab 11/01/17 0917  NA 134*  K 3.9  CL 94*  CO2 23  BUN 21*  CREATININE 0.71  CALCIUM 9.2  GLUCOSE 339*   No results found for: CKTOTAL, CKMB, CKMBINDEX, TROPONINI Lab Results  Component Value Date   CHOL 139 11/02/2017   Lab Results  Component Value Date   HDL 39 (L) 11/02/2017   Lab Results  Component Value Date   LDLCALC 66 11/02/2017   Lab Results  Component Value Date   TRIG 170 (H) 11/02/2017   Lab Results  Component Value Date   CHOLHDL 3.6 11/02/2017   No results found for: LDLDIRECT  No results found for: DDIMER   Radiology/Studies:  Ct Angio Head W Or Wo Contrast Result Date: 11/02/2017 CLINICAL DATA:  Stroke EXAM: CT ANGIOGRAPHY HEAD AND NECK TECHNIQUE: Multidetector CT imaging of the head and neck was performed using the standard protocol during bolus administration of intravenous contrast. Multiplanar CT image reconstructions and MIPs were obtained to evaluate the vascular anatomy. Carotid stenosis measurements (when applicable) are obtained utilizing NASCET criteria, using the distal internal carotid diameter as the denominator. CONTRAST:  50mL ISOVUE-370 IOPAMIDOL (ISOVUE-370) INJECTION 76% COMPARISON:  MRI head 11/01/2017 FINDINGS: CTA NECK FINDINGS Aortic arch: Atherosclerotic  disease in the aortic arch and proximal great vessels. Proximal great vessels adequately patent. Right carotid system: Atherosclerotic calcification in the carotid bifurcation and proximal right internal carotid artery. 35% diameter stenosis right internal carotid artery. Left carotid system: Mild atherosclerotic calcification left carotid bifurcation without significant stenosis. Vertebral arteries: Atherosclerotic calcification in the proximal right internal carotid artery narrowing the lumen by approximately 80% diameter stenosis. Mild postop not dilatation. Scattered atherosclerotic disease in the right vertebral artery. Right vertebral artery ends in PICA with minimal contribution to the basilar. Mild atherosclerotic calcification and mild stenosis origin of left vertebral artery. Remainder of the left vertebral artery is patent without stenosis or dissection. Skeleton: Disc degeneration and spondylosis C5-6. No acute skeletal abnormality. Other neck: Negative for mass or adenopathy. Upper chest: Negative Review of the MIP images confirms the above findings CTA HEAD FINDINGS Anterior circulation: Cavernous carotid widely patent with mild atherosclerotic calcification. Anterior and middle cerebral arteries widely patent without stenosis. Posterior circulation: Left vertebral artery dominant and widely patent. Right vertebral artery ends in PICA with minimal contribution to the basilar. Basilar widely patent. PICA patent bilaterally. Superior cerebellar and posterior cerebral arteries patent bilaterally. Note is made of recent superior cerebellar infarct on the left. Venous sinuses: Negative Anatomic variants: None Delayed phase: Hypodensity left superior cerebellum compatible with acute infarct as noted on recent MRI. No enhancing mass lesion. Chronic microvascular ischemic changes in the white matter. Review of the MIP images confirms the above findings IMPRESSION: Subacute infarct left superior cerebellum  similar to MRI yesterday 35% diameter stenosis proximal right internal carotid artery due to atherosclerotic disease. Mild atherosclerotic disease left carotid bifurcation. 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 Right vertebral artery has minimal contribution to the basilar. Mild atherosclerotic stenosis proximal left vertebral artery. Left superior cerebellar artery widely patent. No significant intracranial stenosis. Electronically Signed   By: Marlan Palauharles  Clark M.D.   On: 11/02/2017 13:38    Mr Brain Wo Contrast Result Date: 11/01/2017 CLINICAL DATA:  Initial evaluation for acute altered mental status. EXAM: MRI HEAD WITHOUT CONTRAST TECHNIQUE: Multiplanar, multiecho pulse sequences of the brain and surrounding structures were obtained without intravenous contrast. COMPARISON:  Comparison made with prior CT from earlier the same day. FINDINGS: Brain: Generalized age appropriate cerebral atrophy. Patchy and confluent T2/FLAIR hyperintensity within the periventricular and deep white matter both cerebral hemispheres, most consistent with chronic small vessel ischemic disease, mild in nature. Patchy small volume abnormal restricted diffusion seen involving the superior left cerebellar hemisphere, consistent with acute ischemic infarct (series 3, image 17). Finding involves the left superior cerebellar artery territory. Involvement of the left cerebellar vermis. No significant mass effect. Associated small volume petechial hemorrhage without hemorrhagic transformation (series 9, image 25). No other evidence for acute or subacute ischemia. No encephalomalacia to suggest chronic cortical infarction. No other evidence for acute or chronic intracranial hemorrhage. No mass lesion, midline shift or mass effect. No hydrocephalus. No extra-axial fluid collection. Major dural sinuses are grossly patent. Pituitary gland suprasellar  region normal. Midline structures intact and normal. Vascular: Major intracranial vascular flow voids are maintained at the skull base. Skull and upper cervical spine: Craniocervical junction normal. Upper cervical spine normal. Bone marrow signal intensity within normal limits. Mild hyperostosis frontalis interna noted. No scalp soft tissue abnormality. Sinuses/Orbits: Globes and orbital soft tissues within normal limits. Axial myopia noted. Mild scattered mucosal thickening within the ethmoidal air cells and maxillary sinuses. Paranasal sinuses are otherwise clear. No mastoid effusion. Inner ear structures normal. Other: None IMPRESSION: 1. 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 Associated small volume petechial hemorrhage without hemorrhagic frank transformation. No significant mass effect. 2. No other acute intracranial abnormality. 3. Mild for age chronic small vessel ischemic disease. Electronically Signed   By: Rise Mu M.D.   On: 11/01/2017 16:45    12-lead ECG SR All prior EKG's in EPIC reviewed with no documented atrial fibrillation  Telemetry SR only  Assessment and Plan:  1. Cryptogenic stroke The patient presents with cryptogenic stroke.  The patient has a TEE planned for this AM.  Dr. Elberta Fortis spoke at length with the patient about monitoring for afib with either a 30 day event monitor or an implantable loop recorder.  Risks, benefits, and alteratives to implantable loop recorder were discussed with the patient today.   At this time, the patient is very clear in her decision to proceed with implantable loop recorder.   Wound care was reviewed with the patient (keep incision clean and dry for 3 days).  Wound check Manisha Cancel be scheduled for the patient  Please call with questions.   Sheilah Pigeon, PA-C 11/03/2017  I have seen and examined this patient with Francis Dowse.  Agree with above, note added to  reflect my findings.  On exam, RRR, no murmurs, lungs clear. Patient had cryptogenic stroke. TEE with PFO. Plan for LE dopplers and if negative, plan for LINQ implant. Risks and benefits discussed and include bleeding and infection, the patient understands the risks and has agreed to the procedure.    Ellenor Wisniewski M. Mahala Rommel MD 11/03/2017 10:38 AM

## 2017-11-03 NOTE — CV Procedure (Addendum)
     Transesophageal Echocardiogram Note  Cherlynn Perchesnnie Delois Locher [id number] May 12, 1941  Procedure: Transesophageal Echocardiogram Indications: Stroke  Procedure Details Consent: Obtained Time Out: Verified patient identification, verified procedure, site/side was marked, verified correct patient position, special equipment/implants available, Radiology Safety Procedures followed,  medications/allergies/relevent history reviewed, required imaging and test results available.  Performed  Medications: During this procedure the patient is administered a total of Versed 2 mg and Fentanyl 25 mcg to achieve and maintain moderate conscious sedation.  The patient's heart rate, blood pressure, and oxygen saturation are monitored continuously during the procedure. The period of conscious sedation is 30 minutes, of which I was present face-to-face 100% of this time.  - Left ventricle: Wall thickness was increased in a pattern of   moderate LVH. Systolic function was normal. The estimated   ejection fraction was in the range of 60% to 65%. Wall motion was   normal; there were no regional wall motion abnormalities. - Aortic valve: No evidence of vegetation. There was mild   regurgitation. - Aorta: There was severe non-mobile atheroma. - Descending aorta: The descending aorta was normal in size. - Mitral valve: There was mild regurgitation. - Left atrium: No evidence of thrombus in the atrial cavity or   appendage. No evidence of thrombus in the atrial cavity or   appendage. - Right atrium: No evidence of thrombus in the atrial cavity or   appendage. - Atrial septum: There was a patent foramen ovale.  Impressions:  - Positive bubble study, PFO present.  Complications: No apparent complications Patient did tolerate procedure well.  Tobias AlexanderKatarina Serenna Deroy, MD, [facility] Medical CenterFACC 11/03/2017, 8:48 AM

## 2017-11-03 NOTE — Discharge Instructions (Signed)
Post implant site care instructions Keep incision clean and dry for 3 days.s.  You can remove outer dressing tomorrow. Leave steri-strips (little pieces of tape) on until seen in the office for wound check appointment. Call the office [phone]) for redness, drainage, swelling, or fever.

## 2017-11-03 NOTE — Clinical Social Work Note (Signed)
Clinical Social Work Assessment  Patient Details  Name: Yvonne Newton MRN: [id number] Date of Birth: [birth year]  Date of referral:  11/03/17               Reason for consult:  Facility Placement                Permission sought to share information with:  Facility Sport and exercise psychologist, Family Supports Permission granted to share information::  Yes, Verbal Permission Granted  Name::     Adelina Mings  Agency::  SNF  Relationship::  Children  Contact Information:     Housing/Transportation Living arrangements for the past 2 months:  Single Family Home Source of Information:  Patient, Adult Children Patient Interpreter Needed:  None Criminal Activity/Legal Involvement Pertinent to Current Situation/Hospitalization:  No - Comment as needed Significant Relationships:  Adult Children, Other Family Members Lives with:  Self, Adult Children Do you feel safe going back to the place where you live?  Yes Need for family participation in patient care:  No (Coment)  Care giving concerns:  Patient lives at home with children who are available intermittently for support. Patient will benefit from rehab at discharge prior to returning home so that she is safer to independently care for herself when family is not available for support.   Social Worker assessment / plan:  CSW met with patient and patient's adult children, Marcie Bal and Nicole Kindred, at the bedside to discuss discharge planning. CSW explained recommendation for CIR, and awaiting insurance approval, but that there needs to be a plan in place just in case insurance denies authorization for CIR at discharge. CSW explained what that means, and options available, including SNF or going home. CSW discussed options with family and determined facility preference for the patient. CSW assured patient and family that the preference is still to keep the patient for CIR pending insurance approval, but that there needs to be other plans just in case they're  needed. CSW to fax out referral and follow up to coordinate possible discharge to SNF.  Employment status:  Retired Nurse, adult PT Recommendations:  Inpatient [place] / Referral to community resources:  [place]  Patient/Family's Response to care:  Patient and family are agreeable to SNF at discharge.  Patient/Family's Understanding of and Emotional Response to Diagnosis, Current Treatment, and Prognosis:  Patient acknowledged that ultimately she just wants to go home, but knows that she doesn't have the support that she needs. Patient's children discuss that they would like information on appealing the insurance decision, if available, if the insurance denies payment for CIR because that is what they really want for the patient. Patient's children also acknowledged that she can't go home, so they would need to pursue SNF, as well. Patient's son would like [facility] as it is convenient to where the family lives.  Emotional Assessment Appearance:  Appears stated age Attitude/Demeanor/Rapport:  Engaged Affect (typically observed):  Pleasant, Appropriate Orientation:  Oriented to Self, Oriented to Place, Oriented to  Time, Oriented to Situation Alcohol / Substance use:  Not Applicable Psych involvement (Current and /or in the community):  No (Comment)  Discharge Needs  Concerns to be addressed:  Care Coordination Readmission within the last 30 days:  No Current discharge risk:  Dependent with Mobility Barriers to Discharge:  Continued Medical Work up, [place], [place] 11/03/2017, 4:10 PM

## 2017-11-03 NOTE — Progress Notes (Signed)
PROGRESS NOTE    Yvonne Newton  JYN:[id number] DOB: November 24, 1940 DOA: 11/01/2017 PCP: Jerl MinaHedrick, James, MD      Brief Narrative:  Yvonne Newton is a 77 y.o. 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  Initially treated in ER for BPPV, follow up MRI showed L small superior cerebellar infarct.  She did have a recent case of hives after eating a blueberry muffin, and was on recent prednisone, but no other new recent illnesses.      Assessment & Plan:  1. Acute ischemic infarct, LEFT cerebellum Presenting with ataxia, vertigo/dizziness, and left sided ataxic gait/discoordination.  MR shows infarct.  No previous Afib.  Not on statin, aspirin.  Microhemorrhage on imaging, okay for aspirin.  LDL 66, HgbA1c 7.6%.    CTA neck normal.  Stroke team recommended TEE that showed PFO.  Bilateral LE US negative for DVT.  LINQ recorder implanted today. -Continue full aspirin -Continue new statin -Restart antihypertensives at discharge -PT recommends CIR -Consulted neurology, Cardiology/EP appreciate recommendations   2.  History of ventricular tachycardia: Followed by Dr. Ladona Ridgelaylor.  This is not a stroke risk factor. -Monitor on telemetry -Continue flecainide, metoprolol  3. Diabetes: -Hold metformin -Lantus while in the hospital -SSI with meals  4.  Hypertension: -Continue metoprolol  -Restart amlodipine tomorrow  5. Leukocytosis Reactive, no localizing signs of infection.      Core measures: -VTE prophylaxis ordered at time of admission -Aspirin ordered at admission -Atrial fibrillation: not previously known or present -tPA not given because of outside window -Dysphagia screen ordered in ER -Lipids ordered -PT eval ordered -Nonsmoker        DVT prophylaxis: SCDs Code Status: FULL Family Communication: Son at bedside MDM and disposition Plan: The below labs and imaging reports were reviewed.  The  patient's status is clinically improving.  She still has left sided deficits corresponding to her ipsilateral cerebellar infarct, as well as dizziness and is high risk to fall.  She has high motivation for rehab, and excellent potential for brisk recovery in CIR and so this is recommended.  CIR eval and insurance coverage pending.     Consultants:   Neuro  Procedures:   US carotids  MRI brain  Echocardiogram  Antimicrobials:   None    Subjective: Feels well.  Still dizzy but improving.  Left sided ataxia, in arm and leg.  No paresthesias, aphasia or slurred speech, no vision changes, no confusion, no focal weakness.  Objective: Vitals:   11/03/17 0900 11/03/17 1131 11/03/17 1620 11/03/17 2106  BP: (!) 148/48 (!) 159/44 (!) 150/53 (!) 151/48  Pulse: 62 65 60 66  Resp: 15 18 17 16   Temp:  98.3 F (36.8 C) 97.7 F (36.5 C) 98.7 F (37.1 C)  TempSrc:  Oral Oral Oral  SpO2: 97% 98% 98% 93%  Weight:      Height:       No intake or output data in the 24 hours ending 11/03/17 2156 Filed Weights   11/03/17 0720  Weight: 83.5 kg (184 lb)    Examination: General appearance: Pleasant an elderly female, sitting in a chair.  No acute distress.    HEENT: Anicteric, conjunctiva pink, lids and lashes normal. No nasal deformity, discharge, epistaxis.  Lips moist.   Skin: Warm and dry.  No jaundice.  No suspicious rashes or lesions.  She has one small nonblanching red macule on the right arm. Cardiac: Regular, no murmrurs.  JVP invisible.  No edema. Respiratory: Lungs clear, respirations unlabored. Abdomen: Abdomen soft.  No TTP. No ascites, distension, hepatosplenomegaly.   MSK: No deformities or effusions. Neuro: CNs normal.  Left sided ataxia stable.  Moves all extremities, speech fluent.   Psych: Sensorium intact and responding to questions, attention normal. Affect blunted, sad.  Judgment and insight appear normal.    Data Reviewed: I have personally reviewed following labs  and imaging studies:  CBC: Recent Labs  Lab 11/01/17 0917  WBC 14.5*  NEUTROABS 10.8*  HGB 14.4  HCT 43.3  MCV 89.3  PLT 236   Basic Metabolic Panel: Recent Labs  Lab 11/01/17 0917  NA 134*  K 3.9  CL 94*  CO2 23  GLUCOSE 339*  BUN 21*  CREATININE 0.71  CALCIUM 9.2   GFR: Estimated Creatinine Clearance: 57.3 mL/min (by C-G formula based on SCr of 0.71 mg/dL). Liver Function Tests: No results for input(s): AST, ALT, ALKPHOS, BILITOT, PROT, ALBUMIN in the last 168 hours. No results for input(s): LIPASE, AMYLASE in the last 168 hours. No results for input(s): AMMONIA in the last 168 hours. Coagulation Profile: No results for input(s): INR, PROTIME in the last 168 hours. Cardiac Enzymes: No results for input(s): CKTOTAL, CKMB, CKMBINDEX, TROPONINI in the last 168 hours. BNP (last 3 results) No results for input(s): PROBNP in the last 8760 hours. HbA1C: Recent Labs    11/02/17 0502  HGBA1C 7.6*   CBG: Recent Labs  Lab 11/02/17 2136 11/03/17 0624 11/03/17 1126 11/03/17 1613 11/03/17 2143  GLUCAP 118* 123* 130* 204* 134*   Lipid Profile: Recent Labs    11/02/17 0502  CHOL 139  HDL 39*  LDLCALC 66  TRIG 161*  CHOLHDL 3.6   Thyroid Function Tests: No results for input(s): TSH, T4TOTAL, FREET4, T3FREE, THYROIDAB in the last 72 hours. Anemia Panel: No results for input(s): VITAMINB12, FOLATE, FERRITIN, TIBC, IRON, RETICCTPCT in the last 72 hours. Urine analysis:    Component Value Date/Time   COLORURINE STRAW (A) 11/01/2017 1215   APPEARANCEUR CLEAR 11/01/2017 1215   LABSPEC 1.012 11/01/2017 1215   PHURINE 7.0 11/01/2017 1215   GLUCOSEU >=500 (A) 11/01/2017 1215   HGBUR NEGATIVE 11/01/2017 1215   BILIRUBINUR NEGATIVE 11/01/2017 1215   KETONESUR 5 (A) 11/01/2017 1215   PROTEINUR NEGATIVE 11/01/2017 1215   NITRITE NEGATIVE 11/01/2017 1215   LEUKOCYTESUR NEGATIVE 11/01/2017 1215   Sepsis Labs: @LABRCNTIP (procalcitonin:4,lacticacidven:4)  )No  results found for this or any previous visit (from the past 240 hour(s)).       Radiology Studies: Ct Angio Head W Or Wo Contrast  Result Date: 11/02/2017 CLINICAL DATA:  Stroke EXAM: CT ANGIOGRAPHY HEAD AND NECK TECHNIQUE: Multidetector CT imaging of the head and neck was performed using the standard protocol during bolus administration of intravenous contrast. Multiplanar CT image reconstructions and MIPs were obtained to evaluate the vascular anatomy. Carotid stenosis measurements (when applicable) are obtained utilizing NASCET criteria, using the distal internal carotid diameter as the denominator. CONTRAST:  50mL ISOVUE-370 IOPAMIDOL (ISOVUE-370) INJECTION 76% COMPARISON:  MRI head 11/01/2017 FINDINGS: CTA NECK FINDINGS Aortic arch: Atherosclerotic disease in the aortic arch and proximal great vessels. Proximal great vessels adequately patent. Right carotid system: Atherosclerotic calcification in the carotid bifurcation and proximal right internal carotid artery. 35% diameter stenosis right internal carotid artery. Left carotid system: Mild atherosclerotic calcification left carotid bifurcation without significant stenosis. Vertebral arteries: Atherosclerotic calcification in the proximal right internal carotid artery narrowing the lumen by approximately 80% diameter stenosis. Mild postop not dilatation.  Scattered atherosclerotic disease in the right vertebral artery. Right vertebral artery ends in PICA with minimal contribution to the basilar. Mild atherosclerotic calcification and mild stenosis origin of left vertebral artery. Remainder of the left vertebral artery is patent without stenosis or dissection. Skeleton: Disc degeneration and spondylosis C5-6. No acute skeletal abnormality. Other neck: Negative for mass or adenopathy. Upper chest: Negative Review of the MIP images confirms the above findings CTA HEAD FINDINGS Anterior circulation: Cavernous carotid widely patent with mild atherosclerotic  calcification. Anterior and middle cerebral arteries widely patent without stenosis. Posterior circulation: Left vertebral artery dominant and widely patent. Right vertebral artery ends in PICA with minimal contribution to the basilar. Basilar widely patent. PICA patent bilaterally. Superior cerebellar and posterior cerebral arteries patent bilaterally. Note is made of recent superior cerebellar infarct on the left. Venous sinuses: Negative Anatomic variants: None Delayed phase: Hypodensity left superior cerebellum compatible with acute infarct as noted on recent MRI. No enhancing mass lesion. Chronic microvascular ischemic changes in the white matter. Review of the MIP images confirms the above findings IMPRESSION: Subacute infarct left superior cerebellum similar to MRI yesterday 35% diameter stenosis proximal right internal carotid artery due to atherosclerotic disease. Mild atherosclerotic disease left carotid bifurcation. 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 Right vertebral artery has minimal contribution to the basilar. Mild atherosclerotic stenosis proximal left vertebral artery. Left superior cerebellar artery widely patent. No significant intracranial stenosis. Electronically Signed   By: Marlan Palau M.D.   On: 11/02/2017 13:38   Ct Angio Neck W Or Wo Contrast  Result Date: 11/02/2017 CLINICAL DATA:  Stroke EXAM: CT ANGIOGRAPHY HEAD AND NECK TECHNIQUE: Multidetector CT imaging of the head and neck was performed using the standard protocol during bolus administration of intravenous contrast. Multiplanar CT image reconstructions and MIPs were obtained to evaluate the vascular anatomy. Carotid stenosis measurements (when applicable) are obtained utilizing NASCET criteria, using the distal internal carotid diameter as the denominator. CONTRAST:  50mL ISOVUE-370 IOPAMIDOL (ISOVUE-370) INJECTION 76% COMPARISON:   MRI head 11/01/2017 FINDINGS: CTA NECK FINDINGS Aortic arch: Atherosclerotic disease in the aortic arch and proximal great vessels. Proximal great vessels adequately patent. Right carotid system: Atherosclerotic calcification in the carotid bifurcation and proximal right internal carotid artery. 35% diameter stenosis right internal carotid artery. Left carotid system: Mild atherosclerotic calcification left carotid bifurcation without significant stenosis. Vertebral arteries: Atherosclerotic calcification in the proximal right internal carotid artery narrowing the lumen by approximately 80% diameter stenosis. Mild postop not dilatation. Scattered atherosclerotic disease in the right vertebral artery. Right vertebral artery ends in PICA with minimal contribution to the basilar. Mild atherosclerotic calcification and mild stenosis origin of left vertebral artery. Remainder of the left vertebral artery is patent without stenosis or dissection. Skeleton: Disc degeneration and spondylosis C5-6. No acute skeletal abnormality. Other neck: Negative for mass or adenopathy. Upper chest: Negative Review of the MIP images confirms the above findings CTA HEAD FINDINGS Anterior circulation: Cavernous carotid widely patent with mild atherosclerotic calcification. Anterior and middle cerebral arteries widely patent without stenosis. Posterior circulation: Left vertebral artery dominant and widely patent. Right vertebral artery ends in PICA with minimal contribution to the basilar. Basilar widely patent. PICA patent bilaterally. Superior cerebellar and posterior cerebral arteries patent bilaterally. Note is made of recent superior cerebellar infarct on the left. Venous sinuses: Negative Anatomic variants: None Delayed phase: Hypodensity left superior cerebellum compatible with acute infarct as noted on recent MRI. No enhancing mass lesion.  Chronic microvascular ischemic changes in the white matter. Review of the MIP images confirms  the above findings IMPRESSION: Subacute infarct left superior cerebellum similar to MRI yesterday 35% diameter stenosis proximal right internal carotid artery due to atherosclerotic disease. Mild atherosclerotic disease left carotid bifurcation. 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 Right vertebral artery has minimal contribution to the basilar. Mild atherosclerotic stenosis proximal left vertebral artery. Left superior cerebellar artery widely patent. No significant intracranial stenosis. Electronically Signed   By: Marlan Palau M.D.   On: 11/02/2017 13:38        Scheduled Meds: . aspirin  300 mg Rectal Daily   Or  . aspirin  325 mg Oral Daily  . atorvastatin  40 mg Oral q1800  . flecainide  50 mg Oral Q12H  . insulin aspart  0-15 Units Subcutaneous TID WC  . insulin aspart  0-5 Units Subcutaneous QHS  . insulin glargine  8 Units Subcutaneous QHS  . metoprolol tartrate  50 mg Oral BID   Continuous Infusions:   LOS: 2 days    Time spent: 25 minutes    Alberteen Sam, MD [facility] 11/03/2017, 9:56 PM     Pager [phone] --- please page though AMION:  [email or web address] Password [email or web address] If 7PM-7AM, please contact night-coverage

## 2017-11-03 NOTE — Interval H&P Note (Signed)
History and Physical Interval Note:  11/03/2017 7:41 AM  Yvonne Newton  has presented today for surgery, with the diagnosis of STROKE  The various methods of treatment have been discussed with the patient and family. After consideration of risks, benefits and other options for treatment, the patient has consented to  Procedure(s): TRANSESOPHAGEAL ECHOCARDIOGRAM (TEE) (N/A) as a surgical intervention .  The patient's history has been reviewed, patient examined, no change in status, stable for surgery.  I have reviewed the patient's chart and labs.  Questions were answered to the patient's satisfaction.     Tobias AlexanderKatarina Yanel Dombrosky

## 2017-11-03 NOTE — Progress Notes (Signed)
I await OT eval and PT progress today before meeting with pt and family to discuss discharge dispo options. Please refer to Dr. Eliane DecreePatel's consult last night. [phone]

## 2017-11-03 NOTE — Progress Notes (Signed)
I met with pt and her son, Nicole Kindred, at bedside. OT has just evaluated pt and continues to recommend CIR admit. Pt in agreement to admit. I will begin insurance approval with [employer] for a possible admit pending their approval. [phone]

## 2017-11-04 ENCOUNTER — Encounter (HOSPITAL_COMMUNITY): Payer: Self-pay

## 2017-11-04 LAB — GLUCOSE, CAPILLARY
GLUCOSE-CAPILLARY: 152 mg/dL — AB (ref 65–99)
Glucose-Capillary: 162 mg/dL — ABNORMAL HIGH (ref 65–99)
Glucose-Capillary: 151 mg/dL — ABNORMAL HIGH (ref 65–99)
Glucose-Capillary: 219 mg/dL — ABNORMAL HIGH (ref 65–99)

## 2017-11-04 MED ORDER — AMLODIPINE BESYLATE 2.5 MG PO TABS
2.5000 mg | ORAL_TABLET | Freq: Two times a day (BID) | ORAL | Status: DC
  Administered 2017-11-04 – 2017-11-05 (×3): 2.5 mg via ORAL
  Filled 2017-11-04 (×3): qty 1

## 2017-11-04 MED ORDER — ASPIRIN 81 MG PO TBEC: 81.0000 mg | DELAYED_RELEASE_TABLET | Freq: Every day | ORAL | Status: DC

## 2017-11-04 MED ORDER — ATORVASTATIN CALCIUM 40 MG PO TABS: 40.0000 mg | ORAL_TABLET | Freq: Every day | ORAL | Status: DC

## 2017-11-04 MED ORDER — ASPIRIN EC 81 MG PO TBEC
81.0000 mg | DELAYED_RELEASE_TABLET | Freq: Every day | ORAL | Status: DC
  Administered 2017-11-04 – 2017-11-05 (×2): 81 mg via ORAL
  Filled 2017-11-04 (×2): qty 1

## 2017-11-04 NOTE — Progress Notes (Signed)
Physical Therapy Treatment Patient Details Name: Jazman Reuter MRN: [id number] DOB: 1941/04/29 Today's Date: 11/04/2017    History of Present Illness Davonda Ausley McKinneyis an 77 y.o.femaleHTN, HLD, DM and Hx ventricular Tachycardia on Flecainide who presents to the ED with acute onset dizziness, ataxia and dysarthria. 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    PT Comments    Pt presents with compensatory movements on R side to compensate for the L step.  Decreased stance time on LLE which causes R foot clearance to decrease and to decrease BOS.  Pt presents with anterior LOB during gait.  Pt challenged with high level balance activities.  Pt is at a high risk for falls and will continue to benefit from skilled rehab at [facility] to receive more aggressive therapies before returning home.    Follow Up Recommendations  CIR     Equipment Recommendations  None recommended by PT    Recommendations for Other Services Rehab consult     Precautions / Restrictions Precautions Precautions: Fall Precaution Comments: change in vision,  Restrictions Weight Bearing Restrictions: No    Mobility  Bed Mobility               General bed mobility comments: Pt sitting in recliner on arrival  Transfers Overall transfer level: Needs assistance Equipment used: Rolling walker (2 wheeled) Transfers: Sit to/from Stand Sit to Stand: Min assist         General transfer comment: Cues for hand placement to and from seated surface and cues to back completely to seated surface before sitting.  Pt attempting to leave RW to the side and required re-education for safety.    Ambulation/Gait Ambulation/Gait assistance: Min assist;Mod assist(min assistance with RW and mod assistance without RW.  Pt reports not using a RW prior to admission.  ) Ambulation Distance (Feet): 100 Feet(additonal trial of 40 feet without device.  ) Assistive device: 1 person hand held assist Gait  Pattern/deviations: Step-through pattern;Decreased step length - right;Decreased step length - left;Staggering left;Staggering right;Ataxic;Narrow base of support Gait velocity: impulsively fast, as she speed up LOB forward noted required mod assistance to correct.   Gait velocity interpretation: Below normal speed for age/gender General Gait Details: Pt presents with cross step on R foot toward midline during gait training, cues to increased BOS to improve balance.  Pt with poor foot clearance on R side.     Stairs            Wheelchair Mobility    Modified Rankin (Stroke Patients Only)       Balance Overall balance assessment: Needs assistance   Sitting balance-Leahy Scale: Good       Standing balance-Leahy Scale: Poor Standing balance comment: Poor balance noted with LOB during gait and balance activities without RW.               High level balance activites: Side stepping;Backward walking;Direction changes;Turns(Pt performed step over object with R foot to improve step height.  Pt performed 15 ft trials of each activity including those listed below with unilateral support of railing.  ) High Level Balance Comments: Pt with slower speed walking backward and sidestepping.              Cognition Arousal/Alertness: Awake/alert Behavior During Therapy: WFL for tasks assessed/performed Overall Cognitive Status: Impaired/Different from baseline Area of Impairment: Problem solving;Safety/judgement  Safety/Judgement: Decreased awareness of safety;Decreased awareness of deficits   Problem Solving: Slow processing;Requires verbal cues;Requires tactile cues        Exercises General Exercises - Lower Extremity Heel Slides: AROM;Both;10 reps;Supine Hip ABduction/ADduction: AROM;Both;10 reps;Supine Straight Leg Raises: AROM;Both;10 reps;Supine    General Comments        Pertinent Vitals/Pain Pain Assessment: 0-10 Pain Score: 4   Pain Location: B posterior ankles Pain Descriptors / Indicators: Burning Pain Intervention(s): Monitored during session;Repositioned    Home Living                      Prior Function            PT Goals (current goals can now be found in the care plan section) Acute Rehab PT Goals Patient Stated Goal: Pt wants to get her left hand working better. PT Goal Formulation: With patient Potential to Achieve Goals: Good Additional Goals Additional Goal #1: Pt to score >19 on DGI to indicate minimal falls risk. Progress towards PT goals: Progressing toward goals    Frequency    Min 4X/week      PT Plan Current plan remains appropriate    Co-evaluation              AM-PAC PT "6 Clicks" Daily Activity  Outcome Measure  Difficulty turning over in bed (including adjusting bedclothes, sheets and blankets)?: None Difficulty moving from lying on back to sitting on the side of the bed? : Unable Difficulty sitting down on and standing up from a chair with arms (e.g., wheelchair, bedside commode, etc,.)?: Unable Help needed moving to and from a bed to chair (including a wheelchair)?: A Lot Help needed walking in hospital room?: A Lot Help needed climbing 3-5 steps with a railing? : A Lot 6 Click Score: 12    End of Session Equipment Utilized During Treatment: Gait belt Activity Tolerance: Patient tolerated treatment well Patient left: in chair;with call bell/phone within reach;with chair alarm set;with family/visitor present Nurse Communication: Mobility status PT Visit Diagnosis: Unsteadiness on feet (R26.81);Other abnormalities of gait and mobility (R26.89);Repeated falls (R29.6);Muscle weakness (generalized) (M62.81);History of falling (Z91.81);Difficulty in walking, not elsewhere classified (R26.2)     Time: 1025-1040 PT Time Calculation (min) (ACUTE ONLY): 15 min  Charges:  $Gait Training: 8-22 mins                    G Codes:       Joycelyn RuaAimee Mylen Mangan, PTA  pager [phone]    Florestine AversAimee J Chantia Amalfitano 11/04/2017, 10:52 AM

## 2017-11-04 NOTE — Progress Notes (Signed)
STROKE TEAM PROGRESS NOTE   HPI   Yvonne Newton is an 77 y.o. 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 Daughters at bedside say the patients symptoms started at approximately 6:15 AM and worsened throughout the day. Per her daughters she fell at home today onto her left side.  MRI Head reveals: Acute Left cerebral artery infarct. Patient takes no ASA or statin at home. She is compliant with all of her other medications.  Patient states she has had a "nagging" left side H/A for the past few weeks with associated "eye stain". The patient also admits to very poor sleep (less than 4 hours per night) for the past month. She denies any H/A or visual changes on exam today.  Date last known well: Date: 11/01/2017 Time last known well: Time: 06:15 tPA Given: No: outside the window Modified Rankin: Rankin Score=0 NIHSS: 1    SUBJECTIVE (INTERVAL HISTORY) No one is at the bedside.  She notes improvement in dizziness.   She is awaiting transfer to rehabilitation when bed available   OBJECTIVE Vitals:   11/04/17 0038 11/04/17 0340 11/04/17 0802 11/04/17 1213  BP: (!) 157/58 (!) 132/37 (!) 138/47 (!) 124/44  Pulse: 60 (!) 55 64 (!) 59  Resp: 16 16 17 17   Temp: 98.3 F (36.8 C) 98.5 F (36.9 C) 98.4 F (36.9 C) 98.6 F (37 C)  TempSrc: Oral Oral Oral Oral  SpO2: 95% 94% 95% 95%  Weight:      Height:        CBC:  Recent Labs  Lab 11/01/17 0917  WBC 14.5*  NEUTROABS 10.8*  HGB 14.4  HCT 43.3  MCV 89.3  PLT 236    Basic Metabolic Panel:  Recent Labs  Lab 11/01/17 0917  NA 134*  K 3.9  CL 94*  CO2 23  GLUCOSE 339*  BUN 21*  CREATININE 0.71  CALCIUM 9.2    Lipid Panel:     Component Value Date/Time   CHOL 139 11/02/2017 0502   TRIG 170 (H) 11/02/2017 0502   HDL 39 (L) 11/02/2017 0502   CHOLHDL 3.6 11/02/2017 0502   VLDL 34 11/02/2017 0502   LDLCALC 66  11/02/2017 0502   HgbA1c:  Lab Results  Component Value Date   HGBA1C 7.6 (H) 11/02/2017   Urine Drug Screen: No results found for: LABOPIA, COCAINSCRNUR, LABBENZ, AMPHETMU, THCU, LABBARB  Alcohol Level No results found for: ETH  IMAGING  Ct Head Wo Contrast 11/01/2017 No acute intracranial pathology.   Mr Brain Wo Contrast 11/01/2017 1. 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 Associated small volume petechial hemorrhage without hemorrhagic frank transformation. No significant mass effect. 2. No other acute intracranial abnormality. 3. Mild for age chronic small vessel ischemic disease.    PHYSICAL EXAM Pleasant elderly obese african [place] lady currently not in distress. . Afebrile. Head is nontraumatic. Neck is supple without bruit.    Cardiac exam no murmur or gallop. Lungs are clear to auscultation. Distal pulses are well felt. Neurological Exam ;  Awake  Alert oriented x 3. Normal speech and language.eye movements full without nystagmus.fundi were not visualized. Vision acuity and fields appear normal. Hearing is normal. Palatal movements are normal. Face symmetric. Tongue midline. Normal strength, tone, reflexes and coordination. Normal sensation. Gait deferred.  ASSESSMENT/PLAN [name] is a 77 y.o. 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 She did not receive IV t-PA due to delay in arrival.   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 Await CTA to help confirm source.   Resultant  Dizziness & imbalance  CT head no acute abnormality  MRI head small L SCA infarct w/ sm vol petechial hmg. Small vessel disease.  CTA head & neck , left superior cerebellar infarct. R ICA 35% stenosis. R VA 80% stenosis with  minimal contribution to BA. L SCA open. No significant anterior intracranial stenosis  Carotid Doppler   There is 1-39% bilateral ICA stenosis. Vertebral artery flow is antegrade.   2D Echo  Left ventricle: The cavity size was normal. There was moderate   concentric hypertrophy. Systolic function was vigorous. The   estimated ejection fraction was in the range of 65% to 70%. Wall   motion was normal; there were no regional wall motion   abnormalities. There was an increased relative contribution of    atrial contraction to ventricular filling.  TEE to look for embolic source. Arranged with [facility] for tomorrow.  If positive for PFO (patent foramen ovale), check bilateral lower extremity venous dopplers to rule out DVT as possible source of stroke. (I have made patient NPO after midnight tonight).   LDL 66  HgbA1c 7.6  SCDs for VTE prophylaxis Fall precautions Diet heart healthy/carb modified Room service appropriate? Yes; Fluid consistency: Thin Diet - low sodium heart healthy Diet Carb Modified  No antithrombotic prior to admission, now on aspirin 325 mg daily (plan discharge on aspirin 81 mg daily)  Patient counseled to be compliant with her antithrombotic medications  Ongoing aggressive stroke risk factor management  Therapy recommendations:  CIR,   Disposition:  CLR Hypertension  Stable  Slight elevation treated as OP by cardiology  Permissive hypertension (OK if < 220/120) but gradually normalize in 5-7 days  Long-term BP goal normotensive  Hyperlipidemia  Home meds:  None  lipitor 40 mg daily started in the hospital  LDL 66, at goal < 70  Continue statin at discharge  Diabetes 2  HgbA1c 7.6, goal < 7.0  Uncontrolled  Other Stroke Risk Factors  Advanced age  Former  Cigarette smoker, quit about 19 years ago   Obesity, Body mass index is 35.94 kg/m., recommend weight loss, diet and exercise as appropriate   Other Active  Problems  Hx VT on flecainide   Leukocytosis, 14.5 felt to be reactive  Hospital day # 3   I have personally examined this patient, reviewed notes, independently viewed imaging studies, participated in medical decision making and plan of care.ROS completed by me personally and pertinent positives fully documented  I have made any additions or clarifications directly to the above note.  . She presented with dizziness secondary to left superior cerebellar artery embolic infarct etiology to be determined. Recommend   and loop recorder. Stroke team will sign off. Follow-up as an outpatient in stroke clinic in 6 weeks. Currently call for questions if any.    Delia Heady, MD Medical Director [facility] Pager: [phone] 11/04/2017 1:48 PM  To contact Stroke Continuity provider, please refer to [email or web address]. After hours, contact General Neurology

## 2017-11-04 NOTE — Progress Notes (Signed)
I have received denial for an inpt rehab admit. We have completed a peer to peer with Dr. Posey Pronto with CIR with Dr. Amalia Hailey at [facility] with denial upheld. I met with patient at bedside and called son, Nicole Kindred, and they are aware. Son has requested SW to call him. RN CM and SW have been made aware. We will sign off at this time. [phone]

## 2017-11-04 NOTE — Discharge Summary (Addendum)
Physician Discharge Summary  Yvonne Newton XBJ:[id number] DOB: 06/15/1941 DOA: 11/01/2017  PCP: Jerl Mina, MD  Admit date: 11/01/2017 Discharge date: 11/05/2017  Admitted From: home Disposition:  CIR   Discharge Condition:  stable   CODE STATUS:  Full code   Consultations:  neurology   Cardiology (EP)   Discharge Diagnoses:  Principal Problem:   Cerebellar stroke (HCC) Active Problems:  h/o V tach   Diabetes mellitus type 2, uncomplicated (HCC)   Essential hypertension   Diabetes mellitus type 2 in nonobese [facility])      Brief Summary: Yvonne Newton is a 77 y.o. 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 Work up in the ER included an MRI which revealed a cerebellar infarct. She was admitted for further work up.     Hospital Course:  Acute ischemic infarct:  left superior cerelbellar w/ petechial hemorrhage - suspected by neurology to be embolic- plan: d/c on Aspirin & Statin with loop recorder in place - MRI: Small acute ischemic left superior cerebral artery territory infarct involving the superior left cerebellum. Associated small volume petechial hemorrhage without hemorrhagic frank transformation - CTA head & neck : left superior cerebellar infarct. R ICA 35% stenosis. R VA 80% stenosis with minimal contribution to BA. L SCA open. No significant anterior intracranial stenosis - Carotid Doppler: There is 1-39% bilateral ICA stenosis. Vertebral artery flow is antegrade.   - LDL 66 - HgbA1c 7.6 - 2 D ECHO: normal EF, grade 1 dCHF, no thrombus   - TEE>> PFO noted- no thrombus - lower extermity venous duplex negative - loop recorder placed on 3/5  V tach  - cont Flecainide and Metoprolol  DM2 - cont Metformin and diet control-  A1c 7.6   HTN - Amlodipine, Metoprolol   Discharge Exam: Vitals:    11/05/17 0735 11/05/17 1012  BP: (!) 136/58 (!) 175/76  Pulse: 60 78  Resp: 17 17  Temp: 98.4 F (36.9 C) 98.3 F (36.8 C)  SpO2: 96% 99%   Vitals:   11/04/17 2359 11/05/17 0426 11/05/17 0735 11/05/17 1012  BP: 136/69 (!) 144/59 (!) 136/58 (!) 175/76  Pulse: (!) 56 (!) 57 60 78  Resp: 18 18 17 17   Temp: 97.8 F (36.6 C) 98.1 F (36.7 C) 98.4 F (36.9 C) 98.3 F (36.8 C)  TempSrc: Oral Oral Axillary Axillary  SpO2: 97% 95% 96% 99%  Weight:      Height:        General: Pt is alert, awake, not in acute distress Cardiovascular: RRR, S1/S2 +, no rubs, no gallops Respiratory: CTA bilaterally, no wheezing, no rhonchi Abdominal: Soft, NT, ND, bowel sounds + Extremities: no edema, no cyanosis   Discharge Instructions  Discharge Instructions    Diet - low sodium heart healthy   Complete by:  As directed    Diet Carb Modified   Complete by:  As directed    Increase activity slowly   Complete by:  As directed      Allergies as of 11/05/2017      Reactions   Blueberry Flavor Hives, Shortness Of Breath   Sulfa Antibiotics Itching   Sulfonamide Derivatives Itching   Other Swelling, Rash   METAL      Medication List    STOP taking these medications   meloxicam 15 MG tablet Commonly known as:  MOBIC  predniSONE 20 MG tablet Commonly known as:  DELTASONE     TAKE these medications   amLODipine 2.5 MG tablet Commonly known as:  NORVASC Take 2.5 mg by mouth 2 (two) times daily.   aspirin 81 MG EC tablet Take 1 tablet (81 mg total) by mouth daily.   atorvastatin 40 MG tablet Commonly known as:  LIPITOR Take 1 tablet (40 mg total) by mouth daily at 6 PM.   CYANOCOBALAMIN IJ Inject as directed every 30 (thirty) days. On or about the 26th of each month   EPIPEN 2-PAK 0.3 mg/0.3 mL Soaj injection Generic drug:  EPINEPHrine Inject 0.3 mg into the muscle once as needed (severe allergic reaction).   famotidine 20 MG tablet Commonly known as:  PEPCID Take 1 tablet  (20 mg total) by mouth 2 (two) times daily.   flecainide 50 MG tablet Commonly known as:  TAMBOCOR TAKE 1 TABLET (50 MG TOTAL) BY MOUTH 2 (TWO) TIMES DAILY.   metFORMIN 500 MG 24 hr tablet Commonly known as:  GLUCOPHAGE-XR Take 500-1,000 mg by mouth See admin instructions. Take one tablet (500 mg) by mouth every morning and two tablets (1000 mg) at bedtime   metoprolol tartrate 50 MG tablet Commonly known as:  LOPRESSOR Take 50 mg by mouth 2 (two) times daily.   omeprazole 20 MG capsule Commonly known as:  PRILOSEC Take 20 mg by mouth daily.   Vitamin D (Ergocalciferol) 50000 units Caps capsule Commonly known as:  DRISDOL Take 50,000 Units by mouth every 7 (seven) days.      Follow-up Information    [facility] [address] [facility] Follow up on 11/16/2017.   Specialty:  Cardiology Why:  2:00PM, wound check visit Contact information: [address], [address] [place] [address] [phone]         Allergies  Allergen Reactions  . Blueberry Flavor Hives and Shortness Of Breath  . Sulfa Antibiotics Itching  . Sulfonamide Derivatives Itching  . Other Swelling and Rash    METAL     Procedures/Studies: 2 d ECHO Left ventricle: The cavity size was normal. There was moderate   concentric hypertrophy. Systolic function was vigorous. The   estimated ejection fraction was in the range of 65% to 70%. Wall   motion was normal; there were no regional wall motion   abnormalities. There was an increased relative contribution of   atrial contraction to ventricular filling. Doppler parameters are   consistent with abnormal left ventricular relaxation (grade 1   diastolic dysfunction). - Aortic valve: There was mild regurgitation. - Mitral valve: Valve area by pressure half-time: 2.5 cm^2. - Tricuspid valve: There was trivial regurgitation. - Pericardium, extracardiac: A trivial, free-flowing pericardial   effusion was identified along the right ventricular  free wall.  TEE Left ventricle: Wall thickness was increased in a pattern of   moderate LVH. Systolic function was normal. The estimated   ejection fraction was in the range of 60% to 65%. Wall motion was   normal; there were no regional wall motion abnormalities. - Aortic valve: No evidence of vegetation. There was mild   regurgitation. - Aorta: There was severe non-mobile atheroma. - Descending aorta: The descending aorta was normal in size. - Mitral valve: There was mild regurgitation. - Left atrium: No evidence of thrombus in the atrial cavity or   appendage. No evidence of thrombus in the atrial cavity or   appendage. - Right atrium: No evidence of thrombus in the atrial cavity or  appendage. - Atrial septum: There was a patent foramen ovale.  Impressions:  - Positive bubble study, PFO present.  Lower extremity venous duplex  Right: There is no evidence of deep vein thrombosis in the lower extremity.There is no evidence of superficial venous thrombosis. There is no evidence of a Baker's cyst. Left: There is no evidence of deep vein thrombosis in the lower extremity.There is no evidence of superficial venous thrombosis. There is an area of mixed echoes in the popliteal fossa measuring 4.27 cm x1.10 cm consistent with a possible Baker's cyst.  Ct Angio Head W Or Wo Contrast  Result Date: 11/02/2017 CLINICAL DATA:  Stroke EXAM: CT ANGIOGRAPHY HEAD AND NECK TECHNIQUE: Multidetector CT imaging of the head and neck was performed using the standard protocol during bolus administration of intravenous contrast. Multiplanar CT image reconstructions and MIPs were obtained to evaluate the vascular anatomy. Carotid stenosis measurements (when applicable) are obtained utilizing NASCET criteria, using the distal internal carotid diameter as the denominator. CONTRAST:  50mL ISOVUE-370 IOPAMIDOL (ISOVUE-370) INJECTION 76% COMPARISON:  MRI head 11/01/2017 FINDINGS: CTA NECK FINDINGS Aortic  arch: Atherosclerotic disease in the aortic arch and proximal great vessels. Proximal great vessels adequately patent. Right carotid system: Atherosclerotic calcification in the carotid bifurcation and proximal right internal carotid artery. 35% diameter stenosis right internal carotid artery. Left carotid system: Mild atherosclerotic calcification left carotid bifurcation without significant stenosis. Vertebral arteries: Atherosclerotic calcification in the proximal right internal carotid artery narrowing the lumen by approximately 80% diameter stenosis. Mild postop not dilatation. Scattered atherosclerotic disease in the right vertebral artery. Right vertebral artery ends in PICA with minimal contribution to the basilar. Mild atherosclerotic calcification and mild stenosis origin of left vertebral artery. Remainder of the left vertebral artery is patent without stenosis or dissection. Skeleton: Disc degeneration and spondylosis C5-6. No acute skeletal abnormality. Other neck: Negative for mass or adenopathy. Upper chest: Negative Review of the MIP images confirms the above findings CTA HEAD FINDINGS Anterior circulation: Cavernous carotid widely patent with mild atherosclerotic calcification. Anterior and middle cerebral arteries widely patent without stenosis. Posterior circulation: Left vertebral artery dominant and widely patent. Right vertebral artery ends in PICA with minimal contribution to the basilar. Basilar widely patent. PICA patent bilaterally. Superior cerebellar and posterior cerebral arteries patent bilaterally. Note is made of recent superior cerebellar infarct on the left. Venous sinuses: Negative Anatomic variants: None Delayed phase: Hypodensity left superior cerebellum compatible with acute infarct as noted on recent MRI. No enhancing mass lesion. Chronic microvascular ischemic changes in the white matter. Review of the MIP images confirms the above findings IMPRESSION: Subacute infarct left  superior cerebellum similar to MRI yesterday 35% diameter stenosis proximal right internal carotid artery due to atherosclerotic disease. Mild atherosclerotic disease left carotid bifurcation. 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 Right vertebral artery has minimal contribution to the basilar. Mild atherosclerotic stenosis proximal left vertebral artery. Left superior cerebellar artery widely patent. No significant intracranial stenosis. Electronically Signed   By: Marlan Palau M.D.   On: 11/02/2017 13:38   Ct Head Wo Contrast  Result Date: 11/01/2017 CLINICAL DATA:  Dizzy, status post fall, slurred speech EXAM: CT HEAD WITHOUT CONTRAST TECHNIQUE: Contiguous axial images were obtained from the base of the skull through the vertex without intravenous contrast. COMPARISON:  None. FINDINGS: Brain: No evidence of acute infarction, hemorrhage, extra-axial collection, ventriculomegaly, or mass effect. Generalized cerebral atrophy. Periventricular white matter low attenuation likely secondary to microangiopathy.  Vascular: Cerebrovascular atherosclerotic calcifications are noted. Skull: Negative for fracture or focal lesion. Sinuses/Orbits: Visualized portions of the orbits are unremarkable. Mastoid sinuses are clear. Left maxillary sinus mucosal thickening. Other: None. IMPRESSION: No acute intracranial pathology. Electronically Signed   By: Elige Ko   On: 11/01/2017 11:00   Ct Angio Neck W Or Wo Contrast  Result Date: 11/02/2017 CLINICAL DATA:  Stroke EXAM: CT ANGIOGRAPHY HEAD AND NECK TECHNIQUE: Multidetector CT imaging of the head and neck was performed using the standard protocol during bolus administration of intravenous contrast. Multiplanar CT image reconstructions and MIPs were obtained to evaluate the vascular anatomy. Carotid stenosis measurements (when applicable) are obtained utilizing NASCET criteria,  using the distal internal carotid diameter as the denominator. CONTRAST:  50mL ISOVUE-370 IOPAMIDOL (ISOVUE-370) INJECTION 76% COMPARISON:  MRI head 11/01/2017 FINDINGS: CTA NECK FINDINGS Aortic arch: Atherosclerotic disease in the aortic arch and proximal great vessels. Proximal great vessels adequately patent. Right carotid system: Atherosclerotic calcification in the carotid bifurcation and proximal right internal carotid artery. 35% diameter stenosis right internal carotid artery. Left carotid system: Mild atherosclerotic calcification left carotid bifurcation without significant stenosis. Vertebral arteries: Atherosclerotic calcification in the proximal right internal carotid artery narrowing the lumen by approximately 80% diameter stenosis. Mild postop not dilatation. Scattered atherosclerotic disease in the right vertebral artery. Right vertebral artery ends in PICA with minimal contribution to the basilar. Mild atherosclerotic calcification and mild stenosis origin of left vertebral artery. Remainder of the left vertebral artery is patent without stenosis or dissection. Skeleton: Disc degeneration and spondylosis C5-6. No acute skeletal abnormality. Other neck: Negative for mass or adenopathy. Upper chest: Negative Review of the MIP images confirms the above findings CTA HEAD FINDINGS Anterior circulation: Cavernous carotid widely patent with mild atherosclerotic calcification. Anterior and middle cerebral arteries widely patent without stenosis. Posterior circulation: Left vertebral artery dominant and widely patent. Right vertebral artery ends in PICA with minimal contribution to the basilar. Basilar widely patent. PICA patent bilaterally. Superior cerebellar and posterior cerebral arteries patent bilaterally. Note is made of recent superior cerebellar infarct on the left. Venous sinuses: Negative Anatomic variants: None Delayed phase: Hypodensity left superior cerebellum compatible with acute infarct as  noted on recent MRI. No enhancing mass lesion. Chronic microvascular ischemic changes in the white matter. Review of the MIP images confirms the above findings IMPRESSION: Subacute infarct left superior cerebellum similar to MRI yesterday 35% diameter stenosis proximal right internal carotid artery due to atherosclerotic disease. Mild atherosclerotic disease left carotid bifurcation. 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 Right vertebral artery has minimal contribution to the basilar. Mild atherosclerotic stenosis proximal left vertebral artery. Left superior cerebellar artery widely patent. No significant intracranial stenosis. Electronically Signed   By: Marlan Palau M.D.   On: 11/02/2017 13:38   Mr Brain Wo Contrast  Result Date: 11/01/2017 CLINICAL DATA:  Initial evaluation for acute altered mental status. EXAM: MRI HEAD WITHOUT CONTRAST TECHNIQUE: Multiplanar, multiecho pulse sequences of the brain and surrounding structures were obtained without intravenous contrast. COMPARISON:  Comparison made with prior CT from earlier the same day. FINDINGS: Brain: Generalized age appropriate cerebral atrophy. Patchy and confluent T2/FLAIR hyperintensity within the periventricular and deep white matter both cerebral hemispheres, most consistent with chronic small vessel ischemic disease, mild in nature. Patchy small volume abnormal restricted diffusion seen involving the superior left cerebellar hemisphere, consistent with acute ischemic infarct (series 3, image 17). Finding involves the left superior cerebellar artery  territory. Involvement of the left cerebellar vermis. No significant mass effect. Associated small volume petechial hemorrhage without hemorrhagic transformation (series 9, image 25). No other evidence for acute or subacute ischemia. No encephalomalacia to suggest chronic cortical infarction. No other evidence  for acute or chronic intracranial hemorrhage. No mass lesion, midline shift or mass effect. No hydrocephalus. No extra-axial fluid collection. Major dural sinuses are grossly patent. Pituitary gland suprasellar region normal. Midline structures intact and normal. Vascular: Major intracranial vascular flow voids are maintained at the skull base. Skull and upper cervical spine: Craniocervical junction normal. Upper cervical spine normal. Bone marrow signal intensity within normal limits. Mild hyperostosis frontalis interna noted. No scalp soft tissue abnormality. Sinuses/Orbits: Globes and orbital soft tissues within normal limits. Axial myopia noted. Mild scattered mucosal thickening within the ethmoidal air cells and maxillary sinuses. Paranasal sinuses are otherwise clear. No mastoid effusion. Inner ear structures normal. Other: None IMPRESSION: 1. 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 Associated small volume petechial hemorrhage without hemorrhagic frank transformation. No significant mass effect. 2. No other acute intracranial abnormality. 3. Mild for age chronic small vessel ischemic disease. Electronically Signed   By: Rise Mu M.D.   On: 11/01/2017 16:45     The results of significant diagnostics from this hospitalization (including imaging, microbiology, ancillary and laboratory) are listed below for reference.     Microbiology: No results found for this or any previous visit (from the past 240 hour(s)).   Labs: BNP (last 3 results) No results for input(s): BNP in the last 8760 hours. Basic Metabolic Panel: Recent Labs  Lab 11/01/17 0917  NA 134*  K 3.9  CL 94*  CO2 23  GLUCOSE 339*  BUN 21*  CREATININE 0.71  CALCIUM 9.2   Liver Function Tests: No results for input(s): AST, ALT, ALKPHOS, BILITOT, PROT, ALBUMIN in the last 168 hours. No results for input(s): LIPASE, AMYLASE in the last 168 hours. No  results for input(s): AMMONIA in the last 168 hours. CBC: Recent Labs  Lab 11/01/17 0917  WBC 14.5*  NEUTROABS 10.8*  HGB 14.4  HCT 43.3  MCV 89.3  PLT 236   Cardiac Enzymes: No results for input(s): CKTOTAL, CKMB, CKMBINDEX, TROPONINI in the last 168 hours. BNP: Invalid input(s): POCBNP CBG: Recent Labs  Lab 11/04/17 1116 11/04/17 1528 11/04/17 2109 11/05/17 0604 11/05/17 0856  GLUCAP 162* 151* 219* 144* 238*   D-Dimer No results for input(s): DDIMER in the last 72 hours. Hgb A1c No results for input(s): HGBA1C in the last 72 hours. Lipid Profile No results for input(s): CHOL, HDL, LDLCALC, TRIG, CHOLHDL, LDLDIRECT in the last 72 hours. Thyroid function studies No results for input(s): TSH, T4TOTAL, T3FREE, THYROIDAB in the last 72 hours.  Invalid input(s): FREET3 Anemia work up No results for input(s): VITAMINB12, FOLATE, FERRITIN, TIBC, IRON, RETICCTPCT in the last 72 hours. Urinalysis    Component Value Date/Time   COLORURINE STRAW (A) 11/01/2017 1215   APPEARANCEUR CLEAR 11/01/2017 1215   LABSPEC 1.012 11/01/2017 1215   PHURINE 7.0 11/01/2017 1215   GLUCOSEU >=500 (A) 11/01/2017 1215   HGBUR NEGATIVE 11/01/2017 1215   BILIRUBINUR NEGATIVE 11/01/2017 1215   KETONESUR 5 (A) 11/01/2017 1215   PROTEINUR NEGATIVE 11/01/2017 1215   NITRITE NEGATIVE 11/01/2017 1215   LEUKOCYTESUR NEGATIVE 11/01/2017 1215   Sepsis Labs Invalid input(s): PROCALCITONIN,  WBC,  LACTICIDVEN Microbiology No results found for this or any previous visit (from the past 240 hour(s)).  Time coordinating discharge: Over 30 minutes  SIGNED:   Calvert CantorSaima Leyton Magoon, MD  [facility] 11/05/2017, 10:29 AM Pager   If 7PM-7AM, please contact night-coverage [email or web address] Password [email or web address]

## 2017-11-04 NOTE — Progress Notes (Signed)
CSW following for discharge planning. CSW alerted by rehab admissions that patient was denied placement. CSW checked with facility, [facility], no beds available today. CSW will need to initiate new authorization request for SNF admission. CSW will follow up tomorrow.   Blenda NicelyElizabeth Kennetta Pavlovic, [place] Clinical Social Worker [phone]

## 2017-11-04 NOTE — Progress Notes (Signed)
I was informed that patient was non-Cardiac monitoring as orders were checked orders for cardiac monitoring are placed. I spoke with Charge RN about this she stated that the cardiologist does not want monitoring patient has loop recorder. [facility] page to discontinue the order. Will continue to monitor Ilean SkillVeronica Lesleigh Hughson LPN

## 2017-11-05 DIAGNOSIS — E785 Hyperlipidemia, unspecified: Secondary | ICD-10-CM | POA: Diagnosis not present

## 2017-11-05 DIAGNOSIS — E559 Vitamin D deficiency, unspecified: Secondary | ICD-10-CM | POA: Diagnosis not present

## 2017-11-05 DIAGNOSIS — I69398 Other sequelae of cerebral infarction: Secondary | ICD-10-CM | POA: Diagnosis not present

## 2017-11-05 DIAGNOSIS — R4189 Other symptoms and signs involving cognitive functions and awareness: Secondary | ICD-10-CM | POA: Diagnosis not present

## 2017-11-05 DIAGNOSIS — I10 Essential (primary) hypertension: Secondary | ICD-10-CM | POA: Diagnosis not present

## 2017-11-05 DIAGNOSIS — E669 Obesity, unspecified: Secondary | ICD-10-CM | POA: Diagnosis not present

## 2017-11-05 DIAGNOSIS — E119 Type 2 diabetes mellitus without complications: Secondary | ICD-10-CM | POA: Diagnosis not present

## 2017-11-05 DIAGNOSIS — Z7984 Long term (current) use of oral hypoglycemic drugs: Secondary | ICD-10-CM | POA: Diagnosis not present

## 2017-11-05 DIAGNOSIS — Z7982 Long term (current) use of aspirin: Secondary | ICD-10-CM | POA: Diagnosis not present

## 2017-11-05 DIAGNOSIS — I779 Disorder of arteries and arterioles, unspecified: Secondary | ICD-10-CM | POA: Diagnosis not present

## 2017-11-05 DIAGNOSIS — E538 Deficiency of other specified B group vitamins: Secondary | ICD-10-CM | POA: Diagnosis not present

## 2017-11-05 DIAGNOSIS — K219 Gastro-esophageal reflux disease without esophagitis: Secondary | ICD-10-CM | POA: Diagnosis not present

## 2017-11-05 DIAGNOSIS — I639 Cerebral infarction, unspecified: Secondary | ICD-10-CM | POA: Diagnosis not present

## 2017-11-05 DIAGNOSIS — M6281 Muscle weakness (generalized): Secondary | ICD-10-CM | POA: Diagnosis not present

## 2017-11-05 LAB — GLUCOSE, CAPILLARY
GLUCOSE-CAPILLARY: 144 mg/dL — AB (ref 65–99)
GLUCOSE-CAPILLARY: 238 mg/dL — AB (ref 65–99)
Glucose-Capillary: 166 mg/dL — ABNORMAL HIGH (ref 65–99)

## 2017-11-05 NOTE — Progress Notes (Signed)
CSW following for discharge plan. CSW checked in with admissions at [facility], and they are not sure if they will have a bed available today. CSW met with patient and patient's son, Yvonne Newton, at bedside to discuss other facility options as the patient is stable for discharge. CSW provided facility list, and obtained information about preference for [place] facilities if [facility] cannot take her, as the patient and family lives in [place]. CSW sent referral to [facility] and [facility], and checked in with admissions to check on available beds.   CSW contacted [facility] to discuss authorization for SNF. CSW provided information to Tammy the on-call RN and she said they will have to build the case and will hopefully get to it today. CSW informed on-call RN that patient is ready for discharge.  CSW will continue to follow.  Laveda Abbe, [place] Clinical Social Worker [phone]

## 2017-11-05 NOTE — Progress Notes (Signed)
  Speech Language Pathology Treatment: Cognitive-Linquistic  Patient Details Name: Yvonne Newton MRN: [id number] DOB: December 28, 1940 Today's Date: 11/05/2017 Time: 1450-1500 SLP Time Calculation (min) (ACUTE ONLY): 10 min  Assessment / Plan / Recommendation Clinical Impression  Pt was seen for skilled ST targeting cognitive goals.  Pt was sitting at edge of bed with son at bedside.  Per report she is awaiting SNF placement and her transfer has been delayed since at least this morning.  Pt demonstrated decreased anticipatory awareness of deficits as evidenced by her ability to state that she needed help with walking but didn't understand why she couldn't go home without 24/7 supervision.  SLP explained rationale behind ongoing therapies at SNF in order to maximize functional independence prior to discharge home.  Pt was left with son at bedside.  Recommend ongoing ST at next level of care.  HPI HPI: Yvonne Pandannie Delois McKinneyis a 77 y.o.femalewith a past medical history significant for NIDDM, HTN, and ventricular tachycardias on flecainidewho presents with ataxia, vertigo. All history collected from EDP and family by phone, as the patient is sedated by Lorazepamand promethazine. Evidently, the patient was in her usual state of health until this morning, she awoke with vertigo, difficulty walking. Her symptoms persisted for over an hour and so she came to the emergency room. MRI revealed small acute ischemic left superior cerebral artery territory with Subacute infarct left superior cerebellum similar to MRI yesterday      SLP Plan  Continue with current plan of care       Recommendations                   Follow up Recommendations: Inpatient Rehab SLP Visit Diagnosis: Dysarthria and anarthria (R47.1);Cognitive communication deficit (R41.841) Plan: Continue with current plan of care       GO                Finnbar Cedillos, Melanee Spryicole L 11/05/2017, 3:07 PM

## 2017-11-05 NOTE — Care Management Important Message (Signed)
Important Message  Patient Details  Name: Yvonne Newton MRN: [id number] Date of Birth: May 22, 1941   Medicare Important Message Given:  Yes    Soha Thorup Stefan ChurchBratton 11/05/2017, 11:43 AM

## 2017-11-05 NOTE — Clinical Social Work Placement (Signed)
   CLINICAL SOCIAL WORK PLACEMENT  NOTE  Date:  11/05/2017  Patient Details  Name: Yvonne Newton MRN: [id number] Date of Birth: 1941-02-06  Clinical Social Work is seeking post-discharge placement for this patient at the Skilled  Nursing Facility level of care (*CSW will initial, date and re-position this form in  chart as items are completed):  Yes   Patient/family provided with [facility] Clinical Social Work Department's list of facilities offering this level of care within the geographic area requested by the patient (or if unable, by the patient's family).  Yes   Patient/family informed of their freedom to choose among providers that offer the needed level of care, that participate in Medicare, Medicaid or managed care program needed by the patient, have an available bed and are willing to accept the patient.  Yes   Patient/family informed of [facility]'s ownership interest in [facility] and [facility], as well as of the fact that they are under no obligation to receive care at these facilities.  PASRR submitted to EDS on       PASRR number received on 11/03/17     Existing PASRR number confirmed on       FL2 transmitted to all facilities in geographic area requested by pt/family on 11/03/17     FL2 transmitted to all facilities within larger geographic area on       Patient informed that his/her managed care company has contracts with or will negotiate with certain facilities, including the following:        Yes   Patient/family informed of bed offers received.  Patient chooses bed at [facility]     Physician recommends and patient chooses bed at      Patient to be transferred to [facility] on 11/05/17.  Patient to be transferred to facility by Family car     Patient family notified on 11/05/17 of transfer.  Name of family member notified:  Alinda Moneyony     PHYSICIAN       Additional Comment:     _______________________________________________ Baldemar LenisElizabeth M Cohen Doleman, LCSW 11/05/2017, 4:29 PM

## 2017-11-05 NOTE — Progress Notes (Signed)
[facility]  Mrs Yvonne Newton was discharged to SNF yesterday however did not go as she was pending a bed/ insurance approval. She remains stable for d/c today. Please see my d/c summary from 3/6.   Yvonne CantorSaima Chea Malan, MD

## 2017-11-05 NOTE — Care Management Note (Signed)
Case Management Note  Patient Details  Name: Yvonne Newton MRN: [id number] Date of Birth: 28-Feb-1941  Subjective/Objective:                    Action/Plan: Pt discharging to SNF today. CM signing off.  Expected Discharge Date:  11/04/17               Expected Discharge Plan:     In-House Referral:     Discharge planning Services     Post Acute Care Choice:    Choice offered to:     DME Arranged:    DME Agency:     HH Arranged:    HH Agency:     Status of Service:  In process, will continue to follow  If discussed at Long Length of Stay Meetings, dates discussed:    Additional Comments:  Kermit BaloKelli F Giovanni Biby, RN 11/05/2017, 3:53 PM

## 2017-11-05 NOTE — Progress Notes (Signed)
Patient discharging to [facility]. She was transported by her Son. Nurse gave discharge papers to the patient and instructed her to give them to the nursing staff when she arrived. The son has patient belongings. Nurse will call report.

## 2017-11-06 DIAGNOSIS — I10 Essential (primary) hypertension: Secondary | ICD-10-CM | POA: Diagnosis not present

## 2017-11-06 DIAGNOSIS — I639 Cerebral infarction, unspecified: Secondary | ICD-10-CM | POA: Diagnosis not present

## 2017-11-06 DIAGNOSIS — E119 Type 2 diabetes mellitus without complications: Secondary | ICD-10-CM | POA: Diagnosis not present

## 2017-11-06 DIAGNOSIS — R4189 Other symptoms and signs involving cognitive functions and awareness: Secondary | ICD-10-CM | POA: Diagnosis not present

## 2017-11-06 DIAGNOSIS — I779 Disorder of arteries and arterioles, unspecified: Secondary | ICD-10-CM | POA: Diagnosis not present

## 2017-11-16 ENCOUNTER — Ambulatory Visit (INDEPENDENT_AMBULATORY_CARE_PROVIDER_SITE_OTHER): Payer: Self-pay | Admitting: *Deleted

## 2017-11-16 DIAGNOSIS — I639 Cerebral infarction, unspecified: Secondary | ICD-10-CM

## 2017-11-16 LAB — CUP PACEART INCLINIC DEVICE CHECK
Date Time Interrogation Session: 20190318141912
Implantable Pulse Generator Implant Date: 20190305

## 2017-11-16 NOTE — Progress Notes (Signed)
Wound Loop check in clinic. Steri-Strips removed, incision edges approximated wound well healed no redness or edema. Battery status: Good . R-waves  0.1926mV. 0 symptom episodes, 0 tachy episodes, pause and brady off. 0 AF episodes. Monthly summary reports and ROV with WC PRN.

## 2017-11-23 ENCOUNTER — Other Ambulatory Visit: Payer: Self-pay

## 2017-11-23 NOTE — Patient Outreach (Signed)
[facility] Care Management  11/23/2017  Yvonne Newton 07/22/1941 [id number]     Transition of Care Referral  Referral Date: 11/23/17 Referral Source: HTA Discharge Report Date of Admission: unknown Diagnosis: unknown Date of Discharge: 11/19/17 Facility: [facility] Insurance: HTA    Outreach attempt # 1 to patient. Spoke with patient. She voices she is "doing great." She is pleased to report that she is "back to normal" and has no residual effects from the stroke. She reports that [facility] she kept getting told that she did not need to be there as she was functioning very well. Patient that she has all her meds. No issues or concerns regarding them. She voices that she found out while she was in the hospital that she has HLD. She reports that she never knew that and she thought her level was being checked every three months like her A1C level. Advised patient that this is not normally checked as frequently. RN CM educated patient on ways to decrease cholesterol. She voices that she is adhering to diet recommendations. She has not yet had a chance to call PCP to schedule follow up appt but will do so soon. RN CM reviewed with patient s/s of stroke and she voiced understanding. Patient states she does not feel like [facility] needs [facility] at this time as she is doing so well and back to normal. She was appreciative of follow up call.    Plan: RN CM will close case at this time as no further needs or concerns.    Antionette Fairyoshanda Dora Simeone, RN,BSN,CCM [facility] Care Management Telephonic Care Management Coordinator Direct Phone: [phone] Toll Free: [phone] Fax: [phone]

## 2017-12-03 ENCOUNTER — Ambulatory Visit (INDEPENDENT_AMBULATORY_CARE_PROVIDER_SITE_OTHER): Payer: PPO | Admitting: *Deleted

## 2017-12-03 DIAGNOSIS — I639 Cerebral infarction, unspecified: Secondary | ICD-10-CM | POA: Diagnosis not present

## 2017-12-07 NOTE — Progress Notes (Signed)
Carelink Summary Report / Loop Recorder 

## 2017-12-09 ENCOUNTER — Telehealth: Payer: Self-pay | Admitting: Cardiology

## 2017-12-09 DIAGNOSIS — Z8673 Personal history of transient ischemic attack (TIA), and cerebral infarction without residual deficits: Secondary | ICD-10-CM | POA: Diagnosis not present

## 2017-12-09 NOTE — Telephone Encounter (Signed)
LMOVM requesting that pt send manual transmission b/c home monitor has not updated in at least 14 days.    

## 2017-12-10 DIAGNOSIS — Z7982 Long term (current) use of aspirin: Secondary | ICD-10-CM | POA: Diagnosis not present

## 2017-12-10 DIAGNOSIS — E785 Hyperlipidemia, unspecified: Secondary | ICD-10-CM | POA: Diagnosis not present

## 2017-12-10 DIAGNOSIS — M6281 Muscle weakness (generalized): Secondary | ICD-10-CM | POA: Diagnosis not present

## 2017-12-10 DIAGNOSIS — E669 Obesity, unspecified: Secondary | ICD-10-CM | POA: Diagnosis not present

## 2017-12-10 DIAGNOSIS — Z7952 Long term (current) use of systemic steroids: Secondary | ICD-10-CM | POA: Diagnosis not present

## 2017-12-10 DIAGNOSIS — E119 Type 2 diabetes mellitus without complications: Secondary | ICD-10-CM | POA: Diagnosis not present

## 2017-12-10 DIAGNOSIS — I69398 Other sequelae of cerebral infarction: Secondary | ICD-10-CM | POA: Diagnosis not present

## 2017-12-10 DIAGNOSIS — Z7984 Long term (current) use of oral hypoglycemic drugs: Secondary | ICD-10-CM | POA: Diagnosis not present

## 2017-12-10 DIAGNOSIS — K219 Gastro-esophageal reflux disease without esophagitis: Secondary | ICD-10-CM | POA: Diagnosis not present

## 2017-12-10 DIAGNOSIS — Z79891 Long term (current) use of opiate analgesic: Secondary | ICD-10-CM | POA: Diagnosis not present

## 2017-12-10 DIAGNOSIS — E559 Vitamin D deficiency, unspecified: Secondary | ICD-10-CM | POA: Diagnosis not present

## 2017-12-10 DIAGNOSIS — I10 Essential (primary) hypertension: Secondary | ICD-10-CM | POA: Diagnosis not present

## 2017-12-10 DIAGNOSIS — Z9181 History of falling: Secondary | ICD-10-CM | POA: Diagnosis not present

## 2017-12-30 DIAGNOSIS — E119 Type 2 diabetes mellitus without complications: Secondary | ICD-10-CM | POA: Diagnosis not present

## 2017-12-30 DIAGNOSIS — I10 Essential (primary) hypertension: Secondary | ICD-10-CM | POA: Diagnosis not present

## 2017-12-30 DIAGNOSIS — E785 Hyperlipidemia, unspecified: Secondary | ICD-10-CM | POA: Diagnosis not present

## 2017-12-30 DIAGNOSIS — M6281 Muscle weakness (generalized): Secondary | ICD-10-CM | POA: Diagnosis not present

## 2017-12-30 DIAGNOSIS — E669 Obesity, unspecified: Secondary | ICD-10-CM | POA: Diagnosis not present

## 2017-12-30 DIAGNOSIS — Z7984 Long term (current) use of oral hypoglycemic drugs: Secondary | ICD-10-CM | POA: Diagnosis not present

## 2017-12-30 DIAGNOSIS — K219 Gastro-esophageal reflux disease without esophagitis: Secondary | ICD-10-CM | POA: Diagnosis not present

## 2017-12-30 DIAGNOSIS — I69398 Other sequelae of cerebral infarction: Secondary | ICD-10-CM | POA: Diagnosis not present

## 2017-12-30 DIAGNOSIS — Z9181 History of falling: Secondary | ICD-10-CM | POA: Diagnosis not present

## 2018-01-05 ENCOUNTER — Ambulatory Visit (INDEPENDENT_AMBULATORY_CARE_PROVIDER_SITE_OTHER): Payer: PPO | Admitting: *Deleted

## 2018-01-05 DIAGNOSIS — I639 Cerebral infarction, unspecified: Secondary | ICD-10-CM | POA: Diagnosis not present

## 2018-01-06 LAB — CUP PACEART REMOTE DEVICE CHECK
Date Time Interrogation Session: 20190404214216
Implantable Pulse Generator Implant Date: 20190305

## 2018-01-06 NOTE — Progress Notes (Signed)
Carelink Summary Report / Loop Recorder 

## 2018-01-19 ENCOUNTER — Ambulatory Visit (INDEPENDENT_AMBULATORY_CARE_PROVIDER_SITE_OTHER): Payer: PPO | Admitting: Adult Health

## 2018-01-19 ENCOUNTER — Encounter: Payer: Self-pay | Admitting: Adult Health

## 2018-01-19 VITALS — BP 140/61 | HR 69 | Ht 60.0 in | Wt 182.6 lb

## 2018-01-19 DIAGNOSIS — I63442 Cerebral infarction due to embolism of left cerebellar artery: Secondary | ICD-10-CM

## 2018-01-19 DIAGNOSIS — E119 Type 2 diabetes mellitus without complications: Secondary | ICD-10-CM | POA: Diagnosis not present

## 2018-01-19 DIAGNOSIS — I10 Essential (primary) hypertension: Secondary | ICD-10-CM | POA: Diagnosis not present

## 2018-01-19 DIAGNOSIS — E785 Hyperlipidemia, unspecified: Secondary | ICD-10-CM

## 2018-01-19 MED ORDER — ROSUVASTATIN CALCIUM 5 MG PO TABS: 5.0000 mg | ORAL_TABLET | Freq: Every day | ORAL | 3 refills | Status: DC

## 2018-01-19 NOTE — Progress Notes (Signed)
I have read the note, and I agree with the clinical assessment and plan.  Kristeen Lantz K Azelyn Batie   

## 2018-01-19 NOTE — Progress Notes (Signed)
[facility] [address] [place]. [place] [address] [phone]       OFFICE FOLLOW UP NOTE  [name] Date of Birth:  [birth year] Medical Record Number:  [id number]   Reason for Referral:  hospital stroke follow up  CHIEF COMPLAINT:  Chief Complaint  Patient presents with  . 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 History obtained from patient, daughter and chart review. Reviewed all radiology images and labs personally.  [name] is a 77 y.o. female with history of HTN, HLD, DM and Hx ventricular Tachycardia on Flecainide who presented with dizziness, ataxia and dysarthria. She did not receive IV t-PA due to delay in arrival.  CT scan reviewed and showed no acute abnormality.  MRI head reviewed and shows small left SCA infarct with small volume petechial hemorrhage and small vessel disease.  CTA head and neck showed left superior cerebral infarct, right ICA 35% stenosis, right vertebral artery 80% stenosis with minimal contribution to basilar artery but otherwise no significant anterior cranial stenosis and left SCA was patent.  Carotid Doppler showed bilateral ICA stenosis of 1 to 39%.  2D echo showed EF of 65 to 70%.  TEE did show positive bubble study and PFO was present.  Bilateral lower extremity Doppler obtained which is negative for DVT.  Loop recorder placed on 3/5/201 as this left superior cerebellar artery embolic infarct etiology is to be determined.  LDL 66 and A1c 7.6.  This patient was not on antithrombotic PTA was recommended to be discharged on aspirin 81 mg.  As there is no statin PTA recommended patient start Lipitor 40 mg.  Patient discharged to [facility] [place] for rehab in stable condition.  Patient returns today for follow-up visit and is accompanied by  her daughter.  Overall she states she is doing well.  She was in rehab for approximately 18 days and then was discharged home without any additional therapies needed.  Continued complaints of intermittent dizziness but this has been resolving.  Continues to take aspirin without side effects of bleeding or bruising.  Continue to take Lipitor but does claim to have increased myalgias since starting.  Blood pressure today mildly elevated at 140/61.  Daughter frustrated with patient she has not been adequately controlling her blood sugars and continuously eating carbohydrates.  Patient states she should be going in the next month to have her A1c rechecked by her PCP who does manage her diabetes.  Patient has returned to all previous activities without complications.  Currently lives with her 3 adult children and one grandchild.  Loop recorder reviewed and has not shown atrial fibrillation thus far.  Does have complaints of worsening vision where she is not able to see as well far away and is attempting to find diabetic ophthalmologist in the [place] area.  Denies new or worsening stroke/TIA symptoms.  ROS:   14 system review of systems performed and negative with exception of fatigue, light sensitivity and blurred vision  PMH:  Past Medical History:  Diagnosis Date  . DM (diabetes mellitus) (HCC)   . OA (osteoarthritis) of knee    right  . Obesity   . Ventricular tachycardia (HCC)   . Vitamin B 12 deficiency     PSH:  Past Surgical History:  Procedure Laterality Date  . BREAST  BIOPSY Left 2010   Negative  . LOOP RECORDER INSERTION N/A 11/03/2017   Procedure: LOOP RECORDER INSERTION;  Surgeon: Regan Lemming, MD;  Location: [facility] INVASIVE CV LAB;  Service: Cardiovascular;  Laterality: N/A;  . TEE WITHOUT CARDIOVERSION N/A 11/03/2017   Procedure: TRANSESOPHAGEAL ECHOCARDIOGRAM (TEE);  Surgeon: Lars Masson, MD;  Location: [facility] ENDOSCOPY;  Service: Cardiovascular;  Laterality: N/A;     Social History:  Social History   Socioeconomic History  . Marital status: Married    Spouse name: Not on file  . Number of children: Not on file  . Years of education: Not on file  . Highest education level: Not on file  Occupational History  . Not on file  Social Needs  . Financial resource strain: Not on file  . Food insecurity:    Worry: Not on file    Inability: Not on file  . Transportation needs:    Medical: Not on file    Non-medical: Not on file  Tobacco Use  . Smoking status: Former Smoker    Last attempt to quit: 09/01/1998    Years since quitting: 19.3  . Smokeless tobacco: Never Used  Substance and Sexual Activity  . Alcohol use: No    Frequency: Never  . Drug use: No  . Sexual activity: Not on file  Lifestyle  . Physical activity:    Days per week: Not on file    Minutes per session: Not on file  . Stress: Not on file  Relationships  . Social connections:    Talks on phone: Not on file    Gets together: Not on file    Attends religious service: Not on file    Active member of club or organization: Not on file    Attends meetings of clubs or organizations: Not on file    Relationship status: Not on file  . Intimate partner violence:    Fear of current or ex partner: Not on file    Emotionally abused: Not on file    Physically abused: Not on file    Forced sexual activity: Not on file  Other Topics Concern  . Not on file  Social History Narrative  . Not on file    Family History:  Family History  Problem Relation Age of Onset  . Heart attack Father 18  . Heart attack Brother 48  . Heart attack Brother        had CABG  . Breast cancer Neg Hx     Medications:   Current Outpatient Medications on File Prior to Visit  Medication Sig Dispense Refill  . amLODipine (NORVASC) 2.5 MG tablet Take 2.5 mg by mouth 2 (two) times daily.    Marland Kitchen aspirin EC 81 MG EC tablet Take 1 tablet (81 mg total) by mouth daily.    . CYANOCOBALAMIN IJ Inject as  directed every 30 (thirty) days. On or about the 26th of each month    . EPINEPHrine (EPIPEN 2-PAK) 0.3 mg/0.3 mL IJ SOAJ injection Inject 0.3 mg into the muscle once as needed (severe allergic reaction).    . famotidine (PEPCID) 20 MG tablet Take 1 tablet (20 mg total) by mouth 2 (two) times daily. 10 tablet 0  . flecainide (TAMBOCOR) 50 MG tablet TAKE 1 TABLET (50 MG TOTAL) BY MOUTH 2 (TWO) TIMES DAILY. 60 tablet 2  . metFORMIN (GLUCOPHAGE-XR) 500 MG 24 hr tablet Take 500-1,000 mg by mouth See admin instructions. Take one tablet (500 mg) by mouth  every morning and two tablets (1000 mg) at bedtime    . metoprolol tartrate (LOPRESSOR) 50 MG tablet Take 50 mg by mouth 2 (two) times daily.    Marland Kitchen omeprazole (PRILOSEC) 20 MG capsule Take 20 mg by mouth daily.     . Vitamin D, Ergocalciferol, (DRISDOL) 50000 units CAPS capsule Take 50,000 Units by mouth every 7 (seven) days.     No current facility-administered medications on file prior to visit.     Allergies:   Allergies  Allergen Reactions  . Blueberry Flavor Hives and Shortness Of Breath  . Sulfa Antibiotics Itching  . Sulfonamide Derivatives Itching  . Other Swelling and Rash    METAL     Physical Exam  Vitals:   01/19/18 1049  BP: 140/61  Pulse: 69  Weight: 182 lb 9.6 oz (82.8 kg)  Height: 5' (1.524 m)   Body mass index is 35.66 kg/m. No exam data present  General: well developed, pleasant elderly Caucasian female, well nourished, seated, in no evident distress Head: head normocephalic and atraumatic.   Neck: supple with no carotid or supraclavicular bruits Cardiovascular: regular rate and rhythm, no murmurs; +1 pitting edema bilateral lower extremities Musculoskeletal: no deformity Skin:  no rash/petichiae Vascular:  Normal pulses all extremities  Neurologic Exam Mental Status: Awake and fully alert. Oriented to place and time. Recent and remote memory intact. Attention span, concentration and fund of knowledge  appropriate. Mood and affect appropriate.  Cranial Nerves: Fundoscopic exam reveals sharp disc margins. Pupils equal, briskly reactive to light. Extraocular movements full without nystagmus. Visual fields full to confrontation. Hearing intact. Facial sensation intact. Face, tongue, palate moves normally and symmetrically.  Motor: Normal bulk and tone. Normal strength in all tested extremity muscles. Sensory.: intact to touch , pinprick , position and vibratory sensation.  Coordination: Rapid alternating movements normal in all extremities. Finger-to-nose and heel-to-shin performed accurately bilaterally. Gait and Station: Arises from chair without difficulty. Stance is normal. Gait demonstrates normal stride length and balance .  Romberg negative.  Mild difficulty with tandem gait.. Reflexes: 1+ and symmetric. Toes downgoing.    NIHSS  0 Modified Rankin  1   Diagnostic Data (Labs, Imaging, Testing)  CT HEAD WO CONTRAST 11/01/2017 IMPRESSION: No acute intracranial pathology.  MR BRAIN WO CONTRAST 11/01/2017 IMPRESSION: 1. 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 Associated small volume petechial hemorrhage without hemorrhagic frank transformation. No significant mass effect. 2. No other acute intracranial abnormality. 3. Mild for age chronic small vessel ischemic disease.  CT ANGIO NECK/HEAD 11/02/2017 IMPRESSION: Subacute infarct left superior cerebellum similar to MRI yesterday 35% diameter stenosis proximal right internal carotid artery due to atherosclerotic disease. Mild atherosclerotic disease left carotid bifurcation. 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 Right vertebral artery has minimal contribution to the basilar. Mild atherosclerotic stenosis proximal left vertebral artery. Left superior cerebellar artery  widely patent. No significant intracranial stenosis.  ECHOCARDIOGRAM 11/02/2017 Study Conclusions - Left ventricle: The cavity size was normal. There was moderate   concentric hypertrophy. Systolic function was vigorous. The   estimated ejection fraction was in the range of 65% to 70%. Wall   motion was normal; there were no regional wall motion   abnormalities. There was an increased relative contribution of   atrial contraction to ventricular filling. Doppler parameters are   consistent with abnormal left ventricular relaxation (grade 1   diastolic dysfunction). - Aortic valve:  There was mild regurgitation. - Mitral valve: Valve area by pressure half-time: 2.5 cm^2. - Tricuspid valve: There was trivial regurgitation. - Pericardium, extracardiac: A trivial, free-flowing pericardial   effusion was identified along the right ventricular free wall.  ECHO TEE 11/03/17 Study Conclusions - Left ventricle: Wall thickness was increased in a pattern of   moderate LVH. Systolic function was normal. The estimated   ejection fraction was in the range of 60% to 65%. Wall motion was   normal; there were no regional wall motion abnormalities. - Aortic valve: No evidence of vegetation. There was mild   regurgitation. - Aorta: There was severe non-mobile atheroma. - Descending aorta: The descending aorta was normal in size. - Mitral valve: There was mild regurgitation. - Left atrium: No evidence of thrombus in the atrial cavity or   appendage. No evidence of thrombus in the atrial cavity or   appendage. - Right atrium: No evidence of thrombus in the atrial cavity or   appendage. - Atrial septum: There was a patent foramen ovale. Impressions: - Positive bubble study, PFO present.  VAS [place] LOWER EXTREMITY VENOUS BILAT (DTV) 11/03/2017 Final Interpretation: Right: There is no evidence of deep vein thrombosis in the lower extremity.There is no evidence of superficial venous thrombosis. There is no  evidence of a Baker's cyst. Left: There is no evidence of deep vein thrombosis in the lower extremity.There is no evidence of superficial venous thrombosis. There is an area of mixed echoes in the popliteal fossa measuring 4.27 cm x1.10 cm consistent with a possible Baker's cyst.    ASSESSMENT: Yvonne Newton is a 77 y.o. 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 Vascular risk factors include HTN, HLD and DM.     PLAN: -Continue aspirin 81 mg daily  and start Crestor 5 mg for secondary stroke prevention -Stop Lipitor due to statin myalgias -F/u with PCP regarding your HLD, HTN and DM management -Educated patient on the importance of maintaining proper blood glucose levels -Schedule appointment with diabetic ophthalmologist for decrease in visual acuity over time -continue to monitor BP at home -Advised patient that if intermittent dizziness continues to cause a problem, highly recommend trial of PT -patient will call if she is interested in doing this -Maintain strict control of hypertension with blood pressure goal below 130/90, diabetes with hemoglobin A1c goal below 6.5% and cholesterol with LDL cholesterol (bad cholesterol) goal below 70 mg/dL. I also advised the patient to eat a healthy diet with plenty of whole grains, cereals, fruits and vegetables, exercise regularly and maintain ideal body weight.  Follow up in 3 months or call earlier if needed   Greater than 50% of time during this 25 minute visit was spent on counseling,explanation of diagnosis of left superior cerebellar infarct, reviewing risk factor management of HTN, HLD and DM, planning of further management, discussion with patient and family and coordination of care.    George Hugh, AGNP-BC  [facility] [address] [place], [name] [address]  Phone [phone] Fax  [phone]

## 2018-01-19 NOTE — Patient Instructions (Signed)
Continue aspirin 81 mg daily  and start crestor   for secondary stroke prevention  Stop taking lipitor  due to muscle aches and pains  Continue to follow up with PCP regarding cholesterol, blood pressure and diabetes management   Schedule appointment with eye doctor  Continue to stay active and eat a healthy diet  Continue to monitor blood pressure at home  Maintain strict control of hypertension with blood pressure goal below 130/90, diabetes with hemoglobin A1c goal below 6.5% and cholesterol with LDL cholesterol (bad cholesterol) goal below 70 mg/dL. I also advised the patient to eat a healthy diet with plenty of whole grains, cereals, fruits and vegetables, exercise regularly and maintain ideal body weight.  Followup in the future with me in 4 months or call earlier if needed       Thank you for coming to see [place] at [facility]. I hope we have been able to provide you high quality care today.  You may receive a patient satisfaction survey over the next few weeks. We would appreciate your feedback and comments so that we may continue to improve ourselves and the health of our patients.

## 2018-01-26 LAB — CUP PACEART REMOTE DEVICE CHECK
Implantable Pulse Generator Implant Date: 20190305
MDC IDC SESS DTM: 20190507220648

## 2018-01-28 DIAGNOSIS — E1165 Type 2 diabetes mellitus with hyperglycemia: Secondary | ICD-10-CM | POA: Diagnosis not present

## 2018-01-28 DIAGNOSIS — I10 Essential (primary) hypertension: Secondary | ICD-10-CM | POA: Diagnosis not present

## 2018-02-02 DIAGNOSIS — I679 Cerebrovascular disease, unspecified: Secondary | ICD-10-CM | POA: Diagnosis not present

## 2018-02-02 DIAGNOSIS — E785 Hyperlipidemia, unspecified: Secondary | ICD-10-CM | POA: Diagnosis not present

## 2018-02-02 DIAGNOSIS — E1165 Type 2 diabetes mellitus with hyperglycemia: Secondary | ICD-10-CM | POA: Diagnosis not present

## 2018-02-08 ENCOUNTER — Ambulatory Visit (INDEPENDENT_AMBULATORY_CARE_PROVIDER_SITE_OTHER): Payer: PPO | Admitting: *Deleted

## 2018-02-08 DIAGNOSIS — I639 Cerebral infarction, unspecified: Secondary | ICD-10-CM | POA: Diagnosis not present

## 2018-02-08 NOTE — Progress Notes (Signed)
Carelink Summary Report / Loop Recorder 

## 2018-02-26 ENCOUNTER — Telehealth: Payer: Self-pay | Admitting: Internal Medicine

## 2018-02-26 NOTE — Telephone Encounter (Signed)
Call returned to Pt.  Advised per Dr. Ladona Ridgelaylor- take an extra flecainide for palpitations lasting more than 5 minutes.  Pt states she did take extra flecainide during this episode, but she waited for awhile trying to make it stop.  Advised Pt to take extra flecainide as Dr. Ladona Ridgelaylor advised (within 5 minutes) and if that does not help call back.  Pt indicates understanding.

## 2018-02-26 NOTE — Telephone Encounter (Signed)
New message   Patient c/o Palpitations:  High priority if patient c/o lightheadedness, shortness of breath, or chest pain  1) How long have you had palpitations/irregular HR/ Afib? Are you having the symptoms now? Palpitations lasted for 4 hours on  02/24/18  2) Are you currently experiencing lightheadedness, SOB or CP? no  3) Do you have a history of afib (atrial fibrillation) or irregular heart rhythm? yes  4) Have you checked your BP or HR? (document readings if available): no  5) Are you experiencing any other symptoms? Tired, weak.

## 2018-03-08 DIAGNOSIS — H524 Presbyopia: Secondary | ICD-10-CM | POA: Diagnosis not present

## 2018-03-08 DIAGNOSIS — H5203 Hypermetropia, bilateral: Secondary | ICD-10-CM | POA: Diagnosis not present

## 2018-03-08 DIAGNOSIS — E119 Type 2 diabetes mellitus without complications: Secondary | ICD-10-CM | POA: Diagnosis not present

## 2018-03-08 DIAGNOSIS — H2513 Age-related nuclear cataract, bilateral: Secondary | ICD-10-CM | POA: Diagnosis not present

## 2018-03-08 DIAGNOSIS — H25013 Cortical age-related cataract, bilateral: Secondary | ICD-10-CM | POA: Diagnosis not present

## 2018-03-12 ENCOUNTER — Ambulatory Visit (INDEPENDENT_AMBULATORY_CARE_PROVIDER_SITE_OTHER): Payer: PPO | Admitting: *Deleted

## 2018-03-12 DIAGNOSIS — I639 Cerebral infarction, unspecified: Secondary | ICD-10-CM

## 2018-03-15 NOTE — Progress Notes (Signed)
Carelink Summary Report / Loop Recorder 

## 2018-03-16 DIAGNOSIS — M5441 Lumbago with sciatica, right side: Secondary | ICD-10-CM | POA: Diagnosis not present

## 2018-03-16 DIAGNOSIS — M5442 Lumbago with sciatica, left side: Secondary | ICD-10-CM | POA: Diagnosis not present

## 2018-03-16 DIAGNOSIS — E1165 Type 2 diabetes mellitus with hyperglycemia: Secondary | ICD-10-CM | POA: Diagnosis not present

## 2018-03-16 LAB — CUP PACEART REMOTE DEVICE CHECK
Implantable Pulse Generator Implant Date: 20190305
MDC IDC SESS DTM: 20190609220624

## 2018-03-17 DIAGNOSIS — M5416 Radiculopathy, lumbar region: Secondary | ICD-10-CM | POA: Diagnosis not present

## 2018-03-17 DIAGNOSIS — M5136 Other intervertebral disc degeneration, lumbar region: Secondary | ICD-10-CM | POA: Diagnosis not present

## 2018-04-14 ENCOUNTER — Ambulatory Visit (INDEPENDENT_AMBULATORY_CARE_PROVIDER_SITE_OTHER): Payer: PPO | Admitting: *Deleted

## 2018-04-14 DIAGNOSIS — R21 Rash and other nonspecific skin eruption: Secondary | ICD-10-CM | POA: Diagnosis not present

## 2018-04-14 DIAGNOSIS — I639 Cerebral infarction, unspecified: Secondary | ICD-10-CM | POA: Diagnosis not present

## 2018-04-15 NOTE — Progress Notes (Signed)
Carelink Summary Report / Loop Recorder 

## 2018-04-22 LAB — CUP PACEART REMOTE DEVICE CHECK
Implantable Pulse Generator Implant Date: 20190305
MDC IDC SESS DTM: 20190712220558

## 2018-05-07 ENCOUNTER — Emergency Department (HOSPITAL_COMMUNITY): Payer: PPO

## 2018-05-07 ENCOUNTER — Emergency Department (HOSPITAL_COMMUNITY)
Admission: EM | Admit: 2018-05-07 | Discharge: 2018-05-07 | Disposition: A | Discharge status: Home / Self Care | Payer: PPO | Attending: Emergency Medicine | Admitting: Emergency Medicine

## 2018-05-07 ENCOUNTER — Other Ambulatory Visit: Payer: Self-pay

## 2018-05-07 ENCOUNTER — Encounter (HOSPITAL_COMMUNITY): Payer: Self-pay | Admitting: Emergency Medicine

## 2018-05-07 DIAGNOSIS — Z7982 Long term (current) use of aspirin: Secondary | ICD-10-CM | POA: Diagnosis not present

## 2018-05-07 DIAGNOSIS — Y9301 Activity, walking, marching and hiking: Secondary | ICD-10-CM | POA: Insufficient documentation

## 2018-05-07 DIAGNOSIS — Y999 Unspecified external cause status: Secondary | ICD-10-CM | POA: Insufficient documentation

## 2018-05-07 DIAGNOSIS — M2550 Pain in unspecified joint: Secondary | ICD-10-CM | POA: Diagnosis not present

## 2018-05-07 DIAGNOSIS — I10 Essential (primary) hypertension: Secondary | ICD-10-CM | POA: Diagnosis not present

## 2018-05-07 DIAGNOSIS — S42255A Nondisplaced fracture of greater tuberosity of left humerus, initial encounter for closed fracture: Secondary | ICD-10-CM | POA: Diagnosis not present

## 2018-05-07 DIAGNOSIS — S42295A Other nondisplaced fracture of upper end of left humerus, initial encounter for closed fracture: Secondary | ICD-10-CM

## 2018-05-07 DIAGNOSIS — S0990XA Unspecified injury of head, initial encounter: Secondary | ICD-10-CM | POA: Diagnosis not present

## 2018-05-07 DIAGNOSIS — Z7984 Long term (current) use of oral hypoglycemic drugs: Secondary | ICD-10-CM | POA: Insufficient documentation

## 2018-05-07 DIAGNOSIS — R238 Other skin changes: Secondary | ICD-10-CM | POA: Diagnosis not present

## 2018-05-07 DIAGNOSIS — S199XXA Unspecified injury of neck, initial encounter: Secondary | ICD-10-CM | POA: Diagnosis not present

## 2018-05-07 DIAGNOSIS — E119 Type 2 diabetes mellitus without complications: Secondary | ICD-10-CM | POA: Insufficient documentation

## 2018-05-07 DIAGNOSIS — Z79899 Other long term (current) drug therapy: Secondary | ICD-10-CM | POA: Diagnosis not present

## 2018-05-07 DIAGNOSIS — Y929 Unspecified place or not applicable: Secondary | ICD-10-CM | POA: Diagnosis not present

## 2018-05-07 DIAGNOSIS — W19XXXA Unspecified fall, initial encounter: Secondary | ICD-10-CM

## 2018-05-07 DIAGNOSIS — S59912A Unspecified injury of left forearm, initial encounter: Secondary | ICD-10-CM | POA: Diagnosis not present

## 2018-05-07 DIAGNOSIS — M79632 Pain in left forearm: Secondary | ICD-10-CM | POA: Diagnosis not present

## 2018-05-07 DIAGNOSIS — Z87891 Personal history of nicotine dependence: Secondary | ICD-10-CM | POA: Insufficient documentation

## 2018-05-07 DIAGNOSIS — W010XXA Fall on same level from slipping, tripping and stumbling without subsequent striking against object, initial encounter: Secondary | ICD-10-CM | POA: Insufficient documentation

## 2018-05-07 MED ORDER — OXYCODONE-ACETAMINOPHEN 5-325 MG PO TABS
1.0000 | ORAL_TABLET | Freq: Once | ORAL | Status: AC
  Administered 2018-05-07: 1 via ORAL
  Filled 2018-05-07: qty 1

## 2018-05-07 MED ORDER — TRAMADOL HCL 50 MG PO TABS
50.0000 mg | ORAL_TABLET | Freq: Four times a day (QID) | ORAL | 0 refills | Status: AC | PRN
Start: 2018-05-07 — End: 2018-05-10

## 2018-05-07 NOTE — ED Notes (Signed)
Updated on wait for treatment room. 

## 2018-05-07 NOTE — ED Notes (Signed)
Pt now reporting she struck her head but no LOC; denies blood thinners

## 2018-05-07 NOTE — ED Provider Notes (Signed)
[facility] EMERGENCY DEPARTMENT Provider Note   CSN: [id number] Arrival date & time: 05/07/18  1735     History   Chief Complaint Chief Complaint  Patient presents with  . Fall  . Shoulder Injury    HPI Yvonne Newton is a 77 y.o. 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  Patient reports left shoulder pain and right bicep bruising.  She was walking up a ramp when she thinks she stepped on a corn which made her fall landing mostly on her left side.  She had sudden onset, severe left shoulder pain that has been gradually worsening, radiating to the left elbow.  She does not think she hit her head.  She remembers screaming and her family member coming out to help her stand up, when she tried to use her left arm she felt a pop.  Unwitnessed fall.  She denies associated headache, vision changes, neck pain, changes in her chronic back pain, loss of sensation, tingling or weakness to her extremities.  Takes daily aspirin but no other anticoagulants.  HPI  Past Medical History:  Diagnosis Date  . DM (diabetes mellitus) (HCC)   . OA (osteoarthritis) of knee    right  . Obesity   . Ventricular tachycardia (HCC)   . Vitamin B 12 deficiency     Patient Active Problem List   Diagnosis Date Noted  . Cerebellar stroke (HCC)   . Dysarthria   . Diabetes mellitus type 2 in nonobese (HCC)   . Allergic state 07/22/2017  . Arthritis 07/22/2017  . Diabetes mellitus type 2, uncomplicated (HCC) 07/22/2017  . Hyperlipidemia, unspecified 07/22/2017  . Essential hypertension 07/22/2017  . Tachycardia 07/22/2017  . OBESITY 09/05/2009  . VENTRICULAR TACHYCARDIA 09/05/2009    Past Surgical History:  Procedure Laterality Date  . BREAST BIOPSY Left 2010   Negative  . LOOP RECORDER INSERTION N/A 11/03/2017   Procedure: LOOP RECORDER INSERTION;  Surgeon:  Regan Lemming, MD;  Location: [facility] INVASIVE CV LAB;  Service: Cardiovascular;  Laterality: N/A;  . TEE WITHOUT CARDIOVERSION N/A 11/03/2017   Procedure: TRANSESOPHAGEAL ECHOCARDIOGRAM (TEE);  Surgeon: Lars Masson, MD;  Location: [facility] ENDOSCOPY;  Service: Cardiovascular;  Laterality: N/A;     OB History   None      Home Medications    Prior to Admission medications   Medication Sig Start Date End Date Taking? Authorizing Provider  amLODipine (NORVASC) 2.5 MG tablet Take 2.5 mg by mouth 2 (two) times daily. 02/26/17   [provider]  aspirin EC 81 MG EC tablet Take 1 tablet (81 mg total) by mouth daily. 11/05/17   Calvert Cantor, MD  CYANOCOBALAMIN IJ Inject as directed every 30 (thirty) days. On or about the 26th of each month    [provider]  EPINEPHrine (EPIPEN 2-PAK) 0.3 mg/0.3 mL IJ SOAJ injection Inject 0.3 mg into the muscle once as needed (severe allergic reaction).    [provider]  famotidine (PEPCID) 20 MG tablet Take 1 tablet (20 mg total) by mouth 2 (two) times daily. 10/27/17   Ward, Layla Maw, DO  flecainide (TAMBOCOR) 50 MG tablet TAKE 1 TABLET (50 MG TOTAL) BY MOUTH 2 (TWO) TIMES DAILY. 02/05/12   Marinus Maw, MD  metFORMIN (GLUCOPHAGE-XR) 500 MG 24 hr tablet Take 500-1,000 mg by mouth See admin instructions. Take one tablet (500 mg) by mouth  every morning and two tablets (1000 mg) at bedtime 01/25/16   [provider]  metoprolol tartrate (LOPRESSOR) 50 MG tablet Take 50 mg by mouth 2 (two) times daily. 04/27/17   [provider]  omeprazole (PRILOSEC) 20 MG capsule Take 20 mg by mouth daily.     [provider]  rosuvastatin (CRESTOR) 5 MG tablet Take 1 tablet (5 mg total) by mouth daily at 6 PM. 01/19/18   George Hugh, NP  traMADol (ULTRAM) 50 MG tablet Take 1 tablet (50 mg total) by mouth every 6 (six) hours as needed for up to 3 days. 05/07/18 05/10/18  Liberty Handy, PA-C  Vitamin D, Ergocalciferol,  (DRISDOL) 50000 units CAPS capsule Take 50,000 Units by mouth every 7 (seven) days.    [provider]    Family History Family History  Problem Relation Age of Onset  . Heart attack Father 23  . Heart attack Brother 48  . Heart attack Brother        had CABG  . Breast cancer Neg Hx     Social History Social History   Tobacco Use  . Smoking status: Former Smoker    Last attempt to quit: 09/01/1998    Years since quitting: 19.6  . Smokeless tobacco: Never Used  Substance Use Topics  . Alcohol use: No    Frequency: Never  . Drug use: No     Allergies   Blueberry flavor; Sulfa antibiotics; Sulfonamide derivatives; and Other   Review of Systems Review of Systems  Musculoskeletal: Positive for arthralgias.  Skin: Positive for color change.  All other systems reviewed and are negative.    Physical Exam Updated Vital Signs BP (!) 161/80   Pulse 77   Temp 98.8 F (37.1 C) (Oral)   Resp 19   SpO2 99%   Physical Exam  Constitutional: She is oriented to person, place, and time. She appears well-developed and well-nourished. She is cooperative. She is easily aroused. No distress.  HENT:  Head: Atraumatic.  No abrasions, lacerations, deformity, defect, tenderness or crepitus of facial, nasal, scalp bones. No Raccoon's eyes. No Battle's sign.  No epistaxis or rhinorrhea, septum midline.   Eyes: Conjunctivae are normal.  Lids normal. EOMs and PERRL intact.   Neck:  C-spine: no midline or paraspinal muscular tenderness. Full active ROM of cervical spine w/o pain. Trachea midline  Cardiovascular: Normal rate, regular rhythm, S1 normal, S2 normal and normal heart sounds. Exam reveals no distant heart sounds.  Pulses:      Carotid pulses are 2+ on the right side, and 2+ on the left side.      Radial pulses are 2+ on the right side, and 2+ on the left side.       Dorsalis pedis pulses are 2+ on the right side, and 2+ on the left side.  2+ radial pulses bilaterally    Pulmonary/Chest: Effort normal and breath sounds normal. She has no decreased breath sounds.  Musculoskeletal: Normal range of motion. She exhibits tenderness. She exhibits no deformity.  Right arm: approx 12 x 12 cm area of ecchymosis to right bicep, appropriately tender.  No focal bony tenderness to right shoulder, elbow, wrist. Compartments soft.   Left arm: diffuse proximal humerus/deltoid tenderness. Pt guarding shoulder without PROM secondary to pain.  Normal supination and pronation of forearm. No focal bony tenderness to elbow, wrist. Compartments are soft.   T-spine: no paraspinal muscular tenderness or midline tenderness.    L-spine: no paraspinal muscular  or midline tenderness.  Pelvis: ambulatory without assistance or antalgic gait.   Neurological: She is alert, oriented to person, place, and time and easily aroused.  Speech is fluent without obvious dysarthria or dysphasia. Strength 5/5 with hand grip and ankle F/E.   Sensation to light touch intact in hands and feet. CN I, II and VIII not tested. CN II-XII grossly intact bilaterally.   Skin: Skin is warm and dry. Capillary refill takes less than 2 seconds.  Psychiatric: Her behavior is normal. Thought content normal.     ED Treatments / Results  Labs (all labs ordered are listed, but only abnormal results are displayed) Labs Reviewed - No data to display  EKG None  Radiology Dg Forearm Left  Result Date: 05/07/2018 CLINICAL DATA:  Fall with pain EXAM: LEFT FOREARM - 2 VIEW COMPARISON:  None. FINDINGS: No significant elbow effusion. Radial head alignment is within normal limits. No fracture or malalignment. IMPRESSION: No acute osseous abnormality Electronically Signed   By: Jasmine Pang M.D.   On: 05/07/2018 18:57   Ct Head Wo Contrast  Result Date: 05/07/2018 CLINICAL DATA:  Larey Seat and hit head EXAM: CT HEAD WITHOUT CONTRAST CT CERVICAL SPINE WITHOUT CONTRAST TECHNIQUE: Multidetector CT imaging of the head and  cervical spine was performed following the standard protocol without intravenous contrast. Multiplanar CT image reconstructions of the cervical spine were also generated. COMPARISON:  CT 11/02/2017, MRI brain 11/01/2017, CT 11/01/2017 FINDINGS: CT HEAD FINDINGS Brain: Chronic infarct left cerebellum. No acute territorial infarction, hemorrhage or intracranial mass. Atrophy. Minimal small vessel ischemic change of the white matter. Stable ventricle size. Vascular: No hyperdense vessels.  Carotid vascular calcification Skull: Normal. Negative for fracture or focal lesion. Sinuses/Orbits: No acute finding. Mild mucosal thickening in the maxillary sinuses. Other: None CT CERVICAL SPINE FINDINGS Alignment: No subluxation.  Facet alignment within normal limits Skull base and vertebrae: No acute fracture. No primary bone lesion or focal pathologic process. Soft tissues and spinal canal: No prevertebral fluid or swelling. No visible canal hematoma. Disc levels: Moderate-to-marked degenerative change C5-C6, mild degenerative changes C4-C5 and C6-C7. Upper chest: Negative. Other: None IMPRESSION: 1. No CT evidence for acute intracranial abnormality. Chronic left cerebellar infarct. Small vessel ischemic changes of the white matter 2. Degenerative changes of the cervical spine. No acute osseous abnormality. Electronically Signed   By: Jasmine Pang M.D.   On: 05/07/2018 18:44   Ct Cervical Spine Wo Contrast  Result Date: 05/07/2018 CLINICAL DATA:  Larey Seat and hit head EXAM: CT HEAD WITHOUT CONTRAST CT CERVICAL SPINE WITHOUT CONTRAST TECHNIQUE: Multidetector CT imaging of the head and cervical spine was performed following the standard protocol without intravenous contrast. Multiplanar CT image reconstructions of the cervical spine were also generated. COMPARISON:  CT 11/02/2017, MRI brain 11/01/2017, CT 11/01/2017 FINDINGS: CT HEAD FINDINGS Brain: Chronic infarct left cerebellum. No acute territorial infarction, hemorrhage or  intracranial mass. Atrophy. Minimal small vessel ischemic change of the white matter. Stable ventricle size. Vascular: No hyperdense vessels.  Carotid vascular calcification Skull: Normal. Negative for fracture or focal lesion. Sinuses/Orbits: No acute finding. Mild mucosal thickening in the maxillary sinuses. Other: None CT CERVICAL SPINE FINDINGS Alignment: No subluxation.  Facet alignment within normal limits Skull base and vertebrae: No acute fracture. No primary bone lesion or focal pathologic process. Soft tissues and spinal canal: No prevertebral fluid or swelling. No visible canal hematoma. Disc levels: Moderate-to-marked degenerative change C5-C6, mild degenerative changes C4-C5 and C6-C7. Upper chest: Negative. Other: None IMPRESSION: 1. No  CT evidence for acute intracranial abnormality. Chronic left cerebellar infarct. Small vessel ischemic changes of the white matter 2. Degenerative changes of the cervical spine. No acute osseous abnormality. Electronically Signed   By: Jasmine Pang M.D.   On: 05/07/2018 18:44   Dg Shoulder Left  Result Date: 05/07/2018 CLINICAL DATA:  Pain after fall EXAM: LEFT SHOULDER - 2+ VIEW COMPARISON:  None. FINDINGS: AC joint degenerative change. No humeral head dislocation. Acute nondisplaced fracture at the greater tuberosity of the humerus. IMPRESSION: Nondisplaced proximal humerus fracture. Electronically Signed   By: Jasmine Pang M.D.   On: 05/07/2018 18:59   Dg Humerus Left  Result Date: 05/07/2018 CLINICAL DATA:  Fall with pain EXAM: LEFT HUMERUS - 2+ VIEW COMPARISON:  None. FINDINGS: Acute nondisplaced fracture involving the greater tuberosity of the proximal humerus with faint linear lucency at the left humeral neck. Slight inferior positioning of humeral head could relate to effusion. IMPRESSION: Acute nondisplaced fracture involving the proximal humerus. Electronically Signed   By: Jasmine Pang M.D.   On: 05/07/2018 18:58    Procedures Procedures  (including critical care time)  Medications Ordered in ED Medications  oxyCODONE-acetaminophen (PERCOCET/ROXICET) 5-325 MG per tablet 1 tablet (1 tablet Oral Given 05/07/18 2126)     Initial Impression / Assessment and Plan / ED Course  I have reviewed the triage vital signs and the nursing notes.  Pertinent labs & imaging results that were available during my care of the patient were reviewed by me and considered in my medical decision making (see chart for details).  Clinical Course as of May 08 2235  Fri May 07, 2018  2059 Nondisplaced proximal humerus fracture.  DG Shoulder Left [CG]    Clinical Course User Index [CG] Liberty Handy, PA-C   77 year old female is here after injuries following mechanical fall.  Exam as above concerning for left shoulder/humeral injury.  Otherwise no signs of significant head, CTL, chest, pelvis injury.  No anticoagulants other than aspirin.  Imaging ordered at triage reviewed confirms nondisplaced proximal humerus fracture, left.  Extremity is neurovascularly intact.  No overlying skin injury.  This will be treated with shoulder immobilizer, ice, analgesia and orthopedic follow-up.  I do not think further emergent lab work or imaging is indicated today.  There is no indication for admission.  Will discharge with a short course of tramadol, warned patient and family at bedside about side effects including drowsiness, constipation.  Daughter states that she will be keeping an eye on her mother to prevent recurrent fall.  Discussed return precautions.  Patient and family are in agreement.   Final Clinical Impressions(s) / ED Diagnoses   Final diagnoses:  Fall, initial encounter  Other closed nondisplaced fracture of proximal end of left humerus, initial encounter    ED Discharge Orders         Ordered    traMADol (ULTRAM) 50 MG tablet  Every 6 hours PRN     05/07/18 2200           Liberty Handy, PA-C 05/07/18 2236    Charlynne Pander, MD 05/08/18 1504

## 2018-05-07 NOTE — ED Triage Notes (Addendum)
Pt presents with injuries from a mechanical fall that occurred this afternoon at 4p; pt was ambulating up wheelchair ramp and slipped on acorn falling onto L side; pain with ROM of L shoulder and humerus; pt denies hitting head or LOC

## 2018-05-07 NOTE — Discharge Instructions (Addendum)
You have a stable, proximal humerus fracture.  Initial treatment includes immobilization, ice, pain control.  Take 500 to 1000 mg of acetaminophen (Tylenol) every 6-8 hours for mild to moderate pain.  You can take 50 mg of tramadol for more severe breakthrough pain.  Caution with tramadol, it can be sedating and predispose you to falls.  Tramadol can also cause constipation, ensure you are taking a stool softener and MiraLAX with it.  Return to the ER for loss of sensation, purple discoloration to your hand, worsening pain, swelling, fevers.

## 2018-05-07 NOTE — ED Provider Notes (Signed)
Patient placed in Quick Look pathway, seen and evaluated   Chief Complaint: Fall  HPI:   Patient presents today for evaluation after fall.  She states that she was walking of the wheelchair ramp at her house and slipped on something, she thinks an acorn, falling onto her left side.  She did strike her head, does not take any blood thinning medications.  Denies any prodromal symptoms.  ROS: No headache (one)  Physical Exam:   Gen: No distress  Neuro: Awake and Alert  Skin: Warm    Focused Exam: Patient has diffuse tenderness to palpation along her left upper arm from mid humerus to mid forearm.   Initiation of care has begun. The patient has been counseled on the process, plan, and necessity for staying for the completion/evaluation, and the remainder of the medical screening examination    Norman Clay 05/07/18 1800    Margarita Grizzle, MD 05/10/18 2107

## 2018-05-11 DIAGNOSIS — S42295A Other nondisplaced fracture of upper end of left humerus, initial encounter for closed fracture: Secondary | ICD-10-CM | POA: Diagnosis not present

## 2018-05-17 ENCOUNTER — Ambulatory Visit (INDEPENDENT_AMBULATORY_CARE_PROVIDER_SITE_OTHER): Payer: PPO | Admitting: *Deleted

## 2018-05-17 DIAGNOSIS — I4729 Other ventricular tachycardia: Secondary | ICD-10-CM

## 2018-05-17 DIAGNOSIS — I639 Cerebral infarction, unspecified: Secondary | ICD-10-CM

## 2018-05-17 DIAGNOSIS — I472 Ventricular tachycardia: Secondary | ICD-10-CM

## 2018-05-18 DIAGNOSIS — S42295D Other nondisplaced fracture of upper end of left humerus, subsequent encounter for fracture with routine healing: Secondary | ICD-10-CM | POA: Diagnosis not present

## 2018-05-18 NOTE — Progress Notes (Signed)
Carelink Summary Report / Loop Recorder 

## 2018-05-20 LAB — CUP PACEART REMOTE DEVICE CHECK
Implantable Pulse Generator Implant Date: 20190305
MDC IDC SESS DTM: 20190814221056

## 2018-05-24 ENCOUNTER — Encounter: Payer: Self-pay | Admitting: Adult Health

## 2018-05-24 ENCOUNTER — Ambulatory Visit (INDEPENDENT_AMBULATORY_CARE_PROVIDER_SITE_OTHER): Payer: PPO | Admitting: Adult Health

## 2018-05-24 VITALS — BP 128/65 | HR 62 | Ht 60.0 in | Wt 179.0 lb

## 2018-05-24 DIAGNOSIS — I10 Essential (primary) hypertension: Secondary | ICD-10-CM | POA: Diagnosis not present

## 2018-05-24 DIAGNOSIS — E785 Hyperlipidemia, unspecified: Secondary | ICD-10-CM

## 2018-05-24 DIAGNOSIS — E119 Type 2 diabetes mellitus without complications: Secondary | ICD-10-CM | POA: Diagnosis not present

## 2018-05-24 DIAGNOSIS — I63442 Cerebral infarction due to embolism of left cerebellar artery: Secondary | ICD-10-CM

## 2018-05-24 NOTE — Progress Notes (Addendum)
[facility] [address] [place]. [place] [address] [phone]       OFFICE FOLLOW UP NOTE  [name] Date of Birth:  [birth year] Medical Record Number:  [id number]   Reason for Referral:  hospital stroke follow up  CHIEF COMPLAINT:  Chief Complaint  Patient presents with  . 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 History obtained from patient, daughter and chart review. Reviewed all radiology images and labs personally.  Yvonne Newton is a 77 y.o. female with history of HTN, HLD, DM and Hx ventricular Tachycardia on Flecainide who presented with dizziness, ataxia and dysarthria. She did not receive IV t-PA due to delay in arrival.  CT scan reviewed and showed no acute abnormality.  MRI head reviewed and shows small left SCA infarct with small volume petechial hemorrhage and small vessel disease.  CTA head and neck showed left superior cerebral infarct, right ICA 35% stenosis, right vertebral artery 80% stenosis with minimal contribution to basilar artery but otherwise no significant anterior cranial stenosis and left SCA was patent.  Carotid Doppler showed bilateral ICA stenosis of 1 to 39%.  2D echo showed EF of 65 to 70%.  TEE did show positive bubble study and PFO was present.  Bilateral lower extremity Doppler obtained which is negative for DVT.  Loop recorder placed on 3/5/201 as this left superior cerebellar artery embolic infarct etiology is to be determined.  LDL 66 and A1c 7.6.  This patient was not on antithrombotic PTA was recommended to be discharged on aspirin 81 mg.  As there is no statin PTA recommended patient start Lipitor 40 mg.  Patient discharged to [facility] in [place] for rehab in stable condition.  01/19/2018 visit: Patient returns  today for follow-up visit and is accompanied by her daughter.  Overall she states she is doing well.  She was in rehab for approximately 18 days and then was discharged home without any additional therapies needed.  Continued complaints of intermittent dizziness but this has been resolving.  Continues to take aspirin without side effects of bleeding or bruising.  Continue to take Lipitor but does claim to have increased myalgias since starting.  Blood pressure today mildly elevated at 140/61.  Daughter frustrated with patient she has not been adequately controlling her blood sugars and continuously eating carbohydrates.  Patient states she should be going in the next month to have her A1c rechecked by her PCP who does manage her diabetes.  Patient has returned to all previous activities without complications.  Currently lives with her 3 adult children and one grandchild.  Loop recorder reviewed and has not shown atrial fibrillation thus far.  Does have complaints of worsening vision where she is not able to see as well far away and is attempting to find diabetic ophthalmologist in the [place] area.  Denies new or worsening stroke/TIA symptoms.  Interval history 05/24/2018: Patient is being seen today for scheduled follow-up visit and is accompanied by her daughter.  She did have recent fall on 05/07/2018 which resulted in left nondisplaced proximal humerus fracture.  Recommended treatment with shoulder immobilizer and outpatient orthopedic follow-up and patient was discharged home. She continues to complain of pain in her left shoulder to her elbow. Denies use of ice but  has been using tylenol and ibuprofen as needed for pain. Currently using an immobilizer sling and follows up with orthopedics. Continues to take aspirin 81mg  without side effects of bleeding and only bruising post fall. Continues to take crestor without side effects of myalgias.  A1c 9.2 on 01/28/2018 and states glucose levels at home range  170-200. She states her glucose levels depend on how much ice cream she eats that day and her daughter states she will eat a full bag of chips in one day. She has no intention on changing her diet at this time or to decrease carb intake. Follows with PCP for management of DM. Blood pressure satisfactory at 128/67 and his patient monitors this at home, but this is her typical level.  Loop recorder negative for atrial fibrillation thus far.  Patient does have ophthalmology appointment on 06/01/2018 and she is unsure if this is for a office visit for cataract procedure.  Denies new or worsening stroke/TIA symptoms.   ROS:   14 system review of systems performed and negative with exception of pain  PMH:  Past Medical History:  Diagnosis Date  . DM (diabetes mellitus) (HCC)   . OA (osteoarthritis) of knee    right  . Obesity   . Stroke (HCC)   . Ventricular tachycardia (HCC)   . Vitamin B 12 deficiency     PSH:  Past Surgical History:  Procedure Laterality Date  . BREAST BIOPSY Left 2010   Negative  . LOOP RECORDER INSERTION N/A 11/03/2017   Procedure: LOOP RECORDER INSERTION;  Surgeon: Regan Lemming, MD;  Location: [facility] INVASIVE [facility];  Service: Cardiovascular;  Laterality: N/A;  . TEE WITHOUT CARDIOVERSION N/A 11/03/2017   Procedure: TRANSESOPHAGEAL ECHOCARDIOGRAM (TEE);  Surgeon: Lars Masson, MD;  Location: [facility] ENDOSCOPY;  Service: Cardiovascular;  Laterality: N/A;    Social History:  Social History   Socioeconomic History  . Marital status: Married    Spouse name: Not on file  . Number of children: Not on file  . Years of education: Not on file  . Highest education level: Not on file  Occupational History  . Not on file  Social Needs  . Financial resource strain: Not on file  . Food insecurity:    Worry: Not on file    Inability: Not on file  . Transportation needs:    Medical: Not on file    Non-medical: Not on file  Tobacco Use  . Smoking status: Former Smoker     Last attempt to quit: 09/01/1998    Years since quitting: 19.7  . Smokeless tobacco: Never Used  Substance and Sexual Activity  . Alcohol use: No    Frequency: Never  . Drug use: No  . Sexual activity: Not on file  Lifestyle  . Physical activity:    Days per week: Not on file    Minutes per session: Not on file  . Stress: Not on file  Relationships  . Social connections:    Talks on phone: Not on file    Gets together: Not on file    Attends religious service: Not on file    Active member of club or organization: Not on file    Attends meetings of clubs or organizations: Not on file    Relationship status: Not on file  . Intimate partner violence:    Fear of current or ex partner: Not on file    Emotionally abused: Not on file    Physically abused: Not  on file    Forced sexual activity: Not on file  Other Topics Concern  . Not on file  Social History Narrative  . Not on file    Family History:  Family History  Problem Relation Age of Onset  . Heart attack Father 89  . Heart attack Brother 48  . Heart attack Brother        had CABG  . Breast cancer Neg Hx     Medications:   Current Outpatient Medications on File Prior to Visit  Medication Sig Dispense Refill  . amLODipine (NORVASC) 2.5 MG tablet Take 2.5 mg by mouth 2 (two) times daily.    Marland Kitchen aspirin EC 81 MG EC tablet Take 1 tablet (81 mg total) by mouth daily.    . CYANOCOBALAMIN IJ Inject as directed every 30 (thirty) days. On or about the 26th of each month    . EPINEPHrine (EPIPEN 2-PAK) 0.3 mg/0.3 mL IJ SOAJ injection Inject 0.3 mg into the muscle once as needed (severe allergic reaction).    . famotidine (PEPCID) 20 MG tablet Take 1 tablet (20 mg total) by mouth 2 (two) times daily. 10 tablet 0  . flecainide (TAMBOCOR) 50 MG tablet TAKE 1 TABLET (50 MG TOTAL) BY MOUTH 2 (TWO) TIMES DAILY. 60 tablet 2  . metFORMIN (GLUCOPHAGE-XR) 500 MG 24 hr tablet Take 500-1,000 mg by mouth See admin instructions. Take one  tablet (500 mg) by mouth every morning and two tablets (1000 mg) at bedtime    . metoprolol tartrate (LOPRESSOR) 50 MG tablet Take 50 mg by mouth 2 (two) times daily.    Marland Kitchen omeprazole (PRILOSEC) 20 MG capsule Take 20 mg by mouth daily.     . rosuvastatin (CRESTOR) 5 MG tablet Take 1 tablet (5 mg total) by mouth daily at 6 PM. 90 tablet 3  . traMADol (ULTRAM) 50 MG tablet TAKE 1 TABLET BY MOUTH TWICE A DAY AS NEEDED FOR PAIN    . Vitamin D, Ergocalciferol, (DRISDOL) 50000 units CAPS capsule Take 50,000 Units by mouth every 7 (seven) days.    . cetirizine (ZYRTEC) 10 MG tablet Take 10 mg by mouth daily.  2   No current facility-administered medications on file prior to visit.     Allergies:   Allergies  Allergen Reactions  . Blueberry Flavor Hives and Shortness Of Breath  . Sulfa Antibiotics Itching  . Sulfonamide Derivatives Itching  . Other Swelling and Rash    METAL     Physical Exam  Vitals:   05/24/18 1243  BP: 128/65  Pulse: 62  Weight: 179 lb (81.2 kg)  Height: 5' (1.524 m)   Body mass index is 34.96 kg/m. No exam data present  General: well developed, pleasant elderly Caucasian female, well nourished, seated, in no evident distress Head: head normocephalic and atraumatic.   Neck: supple with no carotid or supraclavicular bruits Cardiovascular: regular rate and rhythm, no murmurs Musculoskeletal: no deformity; left arm immobilizer sling Skin:  no rash/petichiae; ecchymosis left arm Vascular:  Normal pulses all extremities  Neurologic Exam Mental Status: Awake and fully alert. Oriented to place and time. Recent and remote memory intact. Attention span, concentration and fund of knowledge appropriate. Mood and affect appropriate.  Cranial Nerves: Fundoscopic exam reveals sharp disc margins. Pupils equal, briskly reactive to light. Extraocular movements full without nystagmus. Visual fields full to confrontation. Hearing intact. Facial sensation intact. Face, tongue,  palate moves normally and symmetrically.  Motor: Normal bulk and tone. Normal strength in all  tested extremity muscles. Sensory.: intact to touch , pinprick , position and vibratory sensation.  Coordination: Rapid alternating movements normal in all extremities. Finger-to-nose and heel-to-shin performed accurately bilaterally. Gait and Station: Arises from chair without difficulty. Stance is normal. Gait demonstrates normal stride length and balance .  Romberg negative.  Mild difficulty with tandem gait.. Reflexes: 1+ and symmetric. Toes downgoing.     Diagnostic Data (Labs, Imaging, Testing)  CT HEAD WO CONTRAST 11/01/2017 IMPRESSION: No acute intracranial pathology.  MR BRAIN WO CONTRAST 11/01/2017 IMPRESSION: 1. 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 Associated small volume petechial hemorrhage without hemorrhagic frank transformation. No significant mass effect. 2. No other acute intracranial abnormality. 3. Mild for age chronic small vessel ischemic disease.  CT ANGIO NECK/HEAD 11/02/2017 IMPRESSION: Subacute infarct left superior cerebellum similar to MRI yesterday 35% diameter stenosis proximal right internal carotid artery due to atherosclerotic disease. Mild atherosclerotic disease left carotid bifurcation. 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 Right vertebral artery has minimal contribution to the basilar. Mild atherosclerotic stenosis proximal left vertebral artery. Left superior cerebellar artery widely patent. No significant intracranial stenosis.  ECHOCARDIOGRAM 11/02/2017 Study Conclusions - Left ventricle: The cavity size was normal. There was moderate   concentric hypertrophy. Systolic function was vigorous. The   estimated ejection fraction was in the range of 65% to 70%. Wall   motion was normal;  there were no regional wall motion   abnormalities. There was an increased relative contribution of   atrial contraction to ventricular filling. Doppler parameters are   consistent with abnormal left ventricular relaxation (grade 1   diastolic dysfunction). - Aortic valve: There was mild regurgitation. - Mitral valve: Valve area by pressure half-time: 2.5 cm^2. - Tricuspid valve: There was trivial regurgitation. - Pericardium, extracardiac: A trivial, free-flowing pericardial   effusion was identified along the right ventricular free wall.  ECHO TEE 11/03/17 Study Conclusions - Left ventricle: Wall thickness was increased in a pattern of   moderate LVH. Systolic function was normal. The estimated   ejection fraction was in the range of 60% to 65%. Wall motion was   normal; there were no regional wall motion abnormalities. - Aortic valve: No evidence of vegetation. There was mild   regurgitation. - Aorta: There was severe non-mobile atheroma. - Descending aorta: The descending aorta was normal in size. - Mitral valve: There was mild regurgitation. - Left atrium: No evidence of thrombus in the atrial cavity or   appendage. No evidence of thrombus in the atrial cavity or   appendage. - Right atrium: No evidence of thrombus in the atrial cavity or   appendage. - Atrial septum: There was a patent foramen ovale. Impressions: - Positive bubble study, PFO present.  VAS US LOWER EXTREMITY VENOUS BILAT (DTV) 11/03/2017 Final Interpretation: Right: There is no evidence of deep vein thrombosis in the lower extremity.There is no evidence of superficial venous thrombosis. There is no evidence of a Baker's cyst. Left: There is no evidence of deep vein thrombosis in the lower extremity.There is no evidence of superficial venous thrombosis. There is an area of mixed echoes in the popliteal fossa measuring 4.27 cm x1.10 cm consistent with a possible Baker's cyst.    ASSESSMENT: Yvonne Perchesnnie Delois  Newton is a 77 y.o. 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 Vascular risk factors include HTN, HLD  and DM.  Patient is being seen today for scheduled follow-up visit and has been stable from stroke standpoint without residual deficits or recurrence of symptoms.    PLAN: -Continue aspirin 81 mg daily  and Crestor 5 mg for secondary stroke prevention -F/u with PCP regarding your HLD, HTN and DM management -Educated patient on the importance of maintaining proper blood glucose levels along with decreasing carbohydrate intake -patient educated on short-term and long-term risks of continued hyperglycemia -Increase activity level as tolerated -continue to monitor BP at home -Recommended sleep apnea testing due to nightly insomnia, daytime fatigue and snoring at night but patient declines at this time as she would like to speak to PCP prior to this possibly occurring along with stating that financially she is unable to undergo additional studies -Highly encouraged use of ice and Tylenol only for left upper extremity pain management along with continued use of immobilizer with continued follow-up with orthopedics -Maintain strict control of hypertension with blood pressure goal below 130/90, diabetes with hemoglobin A1c goal below 6.5% and cholesterol with LDL cholesterol (bad cholesterol) goal below 70 mg/dL. I also advised the patient to eat a healthy diet with plenty of whole grains, cereals, fruits and vegetables, exercise regularly and maintain ideal body weight.  Follow up in 6 months or call earlier if needed   Greater than 50% of time during this 25 minute visit was spent on counseling,explanation of diagnosis of left superior cerebellar infarct, reviewing risk factor management of HTN, HLD and DM, planning of further management, discussion with patient and family and coordination of care.    George Hugh, AGNP-BC  [facility] [address] [place], [name] [address]  Phone [phone] Fax [phone]

## 2018-05-24 NOTE — Patient Instructions (Signed)
Continue aspirin 81 mg daily  and Crestor  for secondary stroke prevention  Continue to follow up with PCP regarding cholesterol, blood pressure and diabetes management   Continue to stay active as tolerated along with decreasing intake of carbohydrates due to increased blood sugars  Continue to monitor blood pressure at home  Maintain strict control of hypertension with blood pressure goal below 130/90, diabetes with hemoglobin A1c goal below 6.5% and cholesterol with LDL cholesterol (bad cholesterol) goal below 70 mg/dL. I also advised the patient to eat a healthy diet with plenty of whole grains, cereals, fruits and vegetables, exercise regularly and maintain ideal body weight.  Followup in the future with me in 6 months or call earlier if needed       Thank you for [facility]. I hope we have been able to provide you high quality care today.  You may receive a patient satisfaction survey over the next few weeks. We would appreciate your feedback and comments so that we may continue to improve ourselves and the health of our patients.

## 2018-05-28 NOTE — Progress Notes (Signed)
I agree with the above plan 

## 2018-05-30 LAB — CUP PACEART REMOTE DEVICE CHECK
Date Time Interrogation Session: 20190917004000
MDC IDC PG IMPLANT DT: 20190305

## 2018-06-01 DIAGNOSIS — H2513 Age-related nuclear cataract, bilateral: Secondary | ICD-10-CM | POA: Diagnosis not present

## 2018-06-01 DIAGNOSIS — H25043 Posterior subcapsular polar age-related cataract, bilateral: Secondary | ICD-10-CM | POA: Diagnosis not present

## 2018-06-01 DIAGNOSIS — H2511 Age-related nuclear cataract, right eye: Secondary | ICD-10-CM | POA: Diagnosis not present

## 2018-06-01 DIAGNOSIS — H02834 Dermatochalasis of left upper eyelid: Secondary | ICD-10-CM | POA: Diagnosis not present

## 2018-06-01 DIAGNOSIS — H18413 Arcus senilis, bilateral: Secondary | ICD-10-CM | POA: Diagnosis not present

## 2018-06-01 DIAGNOSIS — H25013 Cortical age-related cataract, bilateral: Secondary | ICD-10-CM | POA: Diagnosis not present

## 2018-06-09 ENCOUNTER — Other Ambulatory Visit: Payer: Self-pay | Admitting: Family Medicine

## 2018-06-09 DIAGNOSIS — Z1231 Encounter for screening mammogram for malignant neoplasm of breast: Secondary | ICD-10-CM

## 2018-06-14 ENCOUNTER — Telehealth: Payer: Self-pay

## 2018-06-14 NOTE — Telephone Encounter (Signed)
Clearance form fax to [facility] center twice and confirmed to [phone].

## 2018-06-21 ENCOUNTER — Ambulatory Visit (INDEPENDENT_AMBULATORY_CARE_PROVIDER_SITE_OTHER): Payer: PPO | Admitting: *Deleted

## 2018-06-21 DIAGNOSIS — I639 Cerebral infarction, unspecified: Secondary | ICD-10-CM

## 2018-06-21 NOTE — Progress Notes (Signed)
Carelink Summary Report / Loop Recorder 

## 2018-06-22 DIAGNOSIS — S42295D Other nondisplaced fracture of upper end of left humerus, subsequent encounter for fracture with routine healing: Secondary | ICD-10-CM | POA: Diagnosis not present

## 2018-06-29 ENCOUNTER — Ambulatory Visit
Admission: RE | Admit: 2018-06-29 | Discharge: 2018-06-29 | Disposition: A | Discharge status: Home / Self Care | Payer: PPO | Source: Ambulatory Visit | Attending: Family Medicine | Admitting: Family Medicine

## 2018-06-29 DIAGNOSIS — Z1231 Encounter for screening mammogram for malignant neoplasm of breast: Secondary | ICD-10-CM

## 2018-07-05 DIAGNOSIS — M6281 Muscle weakness (generalized): Secondary | ICD-10-CM | POA: Diagnosis not present

## 2018-07-05 DIAGNOSIS — M25512 Pain in left shoulder: Secondary | ICD-10-CM | POA: Diagnosis not present

## 2018-07-05 DIAGNOSIS — M25612 Stiffness of left shoulder, not elsewhere classified: Secondary | ICD-10-CM | POA: Diagnosis not present

## 2018-07-08 DIAGNOSIS — E1165 Type 2 diabetes mellitus with hyperglycemia: Secondary | ICD-10-CM | POA: Diagnosis not present

## 2018-07-08 DIAGNOSIS — Z01818 Encounter for other preprocedural examination: Secondary | ICD-10-CM | POA: Diagnosis not present

## 2018-07-08 DIAGNOSIS — I679 Cerebrovascular disease, unspecified: Secondary | ICD-10-CM | POA: Diagnosis not present

## 2018-07-09 LAB — CUP PACEART REMOTE DEVICE CHECK
Implantable Pulse Generator Implant Date: 20190305
MDC IDC SESS DTM: 20191020013935

## 2018-07-16 DIAGNOSIS — H2511 Age-related nuclear cataract, right eye: Secondary | ICD-10-CM | POA: Diagnosis not present

## 2018-07-16 DIAGNOSIS — H2512 Age-related nuclear cataract, left eye: Secondary | ICD-10-CM | POA: Diagnosis not present

## 2018-07-16 DIAGNOSIS — Z961 Presence of intraocular lens: Secondary | ICD-10-CM | POA: Diagnosis not present

## 2018-07-22 ENCOUNTER — Ambulatory Visit (INDEPENDENT_AMBULATORY_CARE_PROVIDER_SITE_OTHER): Payer: PPO

## 2018-07-22 DIAGNOSIS — I639 Cerebral infarction, unspecified: Secondary | ICD-10-CM

## 2018-07-23 NOTE — Progress Notes (Signed)
Carelink Summary Report / Loop Recorder 

## 2018-08-06 DIAGNOSIS — Z961 Presence of intraocular lens: Secondary | ICD-10-CM | POA: Diagnosis not present

## 2018-08-06 DIAGNOSIS — H2512 Age-related nuclear cataract, left eye: Secondary | ICD-10-CM | POA: Diagnosis not present

## 2018-08-10 DIAGNOSIS — M25512 Pain in left shoulder: Secondary | ICD-10-CM | POA: Diagnosis not present

## 2018-08-17 ENCOUNTER — Encounter: Payer: PPO | Admitting: Internal Medicine

## 2018-08-24 ENCOUNTER — Ambulatory Visit (INDEPENDENT_AMBULATORY_CARE_PROVIDER_SITE_OTHER): Payer: PPO

## 2018-08-24 DIAGNOSIS — I639 Cerebral infarction, unspecified: Secondary | ICD-10-CM

## 2018-08-25 LAB — CUP PACEART REMOTE DEVICE CHECK
Date Time Interrogation Session: 20191225031018
MDC IDC PG IMPLANT DT: 20190305

## 2018-08-26 NOTE — Progress Notes (Signed)
Carelink Summary Report / Loop Recorder 

## 2018-09-05 LAB — CUP PACEART REMOTE DEVICE CHECK
Date Time Interrogation Session: 20191122023953
MDC IDC PG IMPLANT DT: 20190305

## 2018-09-27 ENCOUNTER — Ambulatory Visit (INDEPENDENT_AMBULATORY_CARE_PROVIDER_SITE_OTHER): Payer: PPO

## 2018-09-27 DIAGNOSIS — I4729 Other ventricular tachycardia: Secondary | ICD-10-CM

## 2018-09-27 DIAGNOSIS — I472 Ventricular tachycardia: Secondary | ICD-10-CM | POA: Diagnosis not present

## 2018-09-28 LAB — CUP PACEART REMOTE DEVICE CHECK
Date Time Interrogation Session: 20200127030835
MDC IDC PG IMPLANT DT: 20190305

## 2018-09-28 NOTE — Progress Notes (Signed)
Carelink Summary Report / Loop Recorder 

## 2018-09-30 ENCOUNTER — Ambulatory Visit: Payer: PPO | Admitting: Internal Medicine

## 2018-09-30 ENCOUNTER — Encounter: Payer: Self-pay | Admitting: Internal Medicine

## 2018-09-30 VITALS — BP 144/70 | HR 65 | Ht 60.0 in | Wt 179.8 lb

## 2018-09-30 DIAGNOSIS — Z5181 Encounter for therapeutic drug level monitoring: Secondary | ICD-10-CM | POA: Diagnosis not present

## 2018-09-30 DIAGNOSIS — I639 Cerebral infarction, unspecified: Secondary | ICD-10-CM | POA: Diagnosis not present

## 2018-09-30 DIAGNOSIS — I472 Ventricular tachycardia: Secondary | ICD-10-CM | POA: Diagnosis not present

## 2018-09-30 DIAGNOSIS — Z79899 Other long term (current) drug therapy: Secondary | ICD-10-CM

## 2018-09-30 DIAGNOSIS — I4729 Other ventricular tachycardia: Secondary | ICD-10-CM

## 2018-09-30 LAB — CUP PACEART INCLINIC DEVICE CHECK
Date Time Interrogation Session: 20200130120419
Implantable Pulse Generator Implant Date: 20190305

## 2018-09-30 MED ORDER — DILTIAZEM HCL ER COATED BEADS 120 MG PO CP24: 120.0000 mg | ORAL_CAPSULE | Freq: Every day | ORAL | 3 refills | Status: DC

## 2018-09-30 NOTE — Progress Notes (Signed)
HPI Mrs. Cockman returns today for ongoing evaluation and management of right ventricular outflow tract tachycardia. I've seen the patient for this problem for many years. It is been reasonably well controlled. She has been on flecainide. She also takes beta blockers. In the interim she has had occasional episodes of very brief tachypalpitations. No syncope but she has had near syncope and an ILR was placed for additional evaluation. She has chronic chest pain. She has class II dyspnea. She has a history of preserved left ventricular function.  Allergies  Allergen Reactions  . Blueberry Flavor Hives and Shortness Of Breath  . Sulfa Antibiotics Itching  . Sulfonamide Derivatives Itching  . Other Swelling and Rash    METAL     Current Outpatient Medications  Medication Sig Dispense Refill  . amLODipine (NORVASC) 2.5 MG tablet Take 2.5 mg by mouth 2 (two) times daily.    Marland Kitchen aspirin EC 81 MG EC tablet Take 1 tablet (81 mg total) by mouth daily.    . cetirizine (ZYRTEC) 10 MG tablet Take 10 mg by mouth daily.  2  . CYANOCOBALAMIN IJ Inject as directed every 30 (thirty) days. On or about the 26th of each month    . EPINEPHrine (EPIPEN 2-PAK) 0.3 mg/0.3 mL IJ SOAJ injection Inject 0.3 mg into the muscle once as needed (severe allergic reaction).    . famotidine (PEPCID) 20 MG tablet Take 1 tablet (20 mg total) by mouth 2 (two) times daily. 10 tablet 0  . flecainide (TAMBOCOR) 50 MG tablet TAKE 1 TABLET (50 MG TOTAL) BY MOUTH 2 (TWO) TIMES DAILY. 60 tablet 2  . metFORMIN (GLUCOPHAGE-XR) 500 MG 24 hr tablet Take 500-1,000 mg by mouth See admin instructions. Take one tablet (500 mg) by mouth every morning and two tablets (1000 mg) at bedtime    . metoprolol tartrate (LOPRESSOR) 50 MG tablet Take 50 mg by mouth 2 (two) times daily.    Marland Kitchen omeprazole (PRILOSEC) 20 MG capsule Take 20 mg by mouth daily.     . rosuvastatin (CRESTOR) 5 MG tablet Take 1 tablet (5 mg total) by mouth daily at 6 PM. 90  tablet 3  . traMADol (ULTRAM) 50 MG tablet TAKE 1 TABLET BY MOUTH TWICE A DAY AS NEEDED FOR PAIN    . Vitamin D, Ergocalciferol, (DRISDOL) 50000 units CAPS capsule Take 50,000 Units by mouth every 7 (seven) days.     No current facility-administered medications for this visit.      Past Medical History:  Diagnosis Date  . DM (diabetes mellitus) (HCC)   . OA (osteoarthritis) of knee    right  . Obesity   . Stroke (HCC)   . Ventricular tachycardia (HCC)   . Vitamin B 12 deficiency     ROS:   All systems reviewed and negative except as noted in the HPI.   Past Surgical History:  Procedure Laterality Date  . BREAST BIOPSY Left 2010   Negative  . LOOP RECORDER INSERTION N/A 11/03/2017   Procedure: LOOP RECORDER INSERTION;  Surgeon: Regan Lemming, MD;  Location: [facility] INVASIVE CV LAB;  Service: Cardiovascular;  Laterality: N/A;  . TEE WITHOUT CARDIOVERSION N/A 11/03/2017   Procedure: TRANSESOPHAGEAL ECHOCARDIOGRAM (TEE);  Surgeon: Lars Masson, MD;  Location[facility] ENDOSCOPY;  Service: Cardiovascular;  Laterality: N/A;     Family History  Problem Relation Age of Onset  . Heart attack Father 22  . Heart attack Brother 48  . Heart attack Brother  had CABG  . Breast cancer Neg Hx      Social History   Socioeconomic History  . Marital status: Married    Spouse name: Not on file  . Number of children: Not on file  . Years of education: Not on file  . Highest education level: Not on file  Occupational History  . Not on file  Social Needs  . Financial resource strain: Not on file  . Food insecurity:    Worry: Not on file    Inability: Not on file  . Transportation needs:    Medical: Not on file    Non-medical: Not on file  Tobacco Use  . Smoking status: Former Smoker    Last attempt to quit: 09/01/1998    Years since quitting: 20.0  . Smokeless tobacco: Never Used  Substance and Sexual Activity  . Alcohol use: No    Frequency: Never  . Drug use: No  .  Sexual activity: Not on file  Lifestyle  . Physical activity:    Days per week: Not on file    Minutes per session: Not on file  . Stress: Not on file  Relationships  . Social connections:    Talks on phone: Not on file    Gets together: Not on file    Attends religious service: Not on file    Active member of club or organization: Not on file    Attends meetings of clubs or organizations: Not on file    Relationship status: Not on file  . Intimate partner violence:    Fear of current or ex partner: Not on file    Emotionally abused: Not on file    Physically abused: Not on file    Forced sexual activity: Not on file  Other Topics Concern  . Not on file  Social History Narrative  . Not on file     BP (!) 144/70   Pulse 65   Ht 5' (1.524 m)   Wt 179 lb 12.8 oz (81.6 kg)   SpO2 97%   BMI 35.11 kg/m   Physical Exam:  Well appearing NAD HEENT: Unremarkable Neck:  No JVD, no thyromegally Lymphatics:  No adenopathy Back:  No CVA tenderness Lungs:  Clear with no wheezes HEART:  Regular rate rhythm, no murmurs, no rubs, no clicks Abd:  soft, positive bowel sounds, no organomegally, no rebound, no guarding Ext:  2 plus pulses, no edema, no cyanosis, no clubbing Skin:  No rashes no nodules Neuro:  CN II through XII intact, motor grossly intact   DEVICE  Normal device function.  See PaceArt for details.   Assess/Plan: 1. VT - she has had none that her ILR demonstrates. She will continue her flecainide.  2. Peripheral edema - her legs are swollen. I have asked that the patient stop her amlodipine. She will be given a prescription for cardizem.   Lewayne BuntingGregg Taylor, M.D.

## 2018-09-30 NOTE — Patient Instructions (Addendum)
Medication Instructions:  Your physician has recommended you make the following change in your medication:   1.  Stop amlodipine  2.  Start Cardizem ER 120 mg --Take one capsule by mouth daily.   Labwork: None ordered.  Testing/Procedures: None ordered.  Follow-Up: Your physician wants you to follow-up in: one year with Dr. Ladona Ridgel.   You will receive a reminder letter in the mail two months in advance. If you don't receive a letter, please call our office to schedule the follow-up appointment.  Monthly remote monitoring  Any Other Special Instructions Will Be Listed Below (If Applicable).  If you need a refill on your cardiac medications before your next appointment, please call your pharmacy.

## 2018-10-12 DIAGNOSIS — E119 Type 2 diabetes mellitus without complications: Secondary | ICD-10-CM | POA: Diagnosis not present

## 2018-10-29 ENCOUNTER — Ambulatory Visit (INDEPENDENT_AMBULATORY_CARE_PROVIDER_SITE_OTHER): Payer: PPO | Admitting: *Deleted

## 2018-10-29 DIAGNOSIS — I639 Cerebral infarction, unspecified: Secondary | ICD-10-CM

## 2018-10-30 LAB — CUP PACEART REMOTE DEVICE CHECK
Date Time Interrogation Session: 20200229030610
Implantable Pulse Generator Implant Date: 20190305

## 2018-11-03 NOTE — Progress Notes (Signed)
Carelink Summary Report / Loop Recorder 

## 2018-11-22 ENCOUNTER — Ambulatory Visit: Payer: PPO | Admitting: Adult Health

## 2018-11-25 ENCOUNTER — Other Ambulatory Visit: Payer: Self-pay | Admitting: Adult Health

## 2018-12-01 ENCOUNTER — Other Ambulatory Visit: Payer: Self-pay

## 2018-12-01 ENCOUNTER — Ambulatory Visit (INDEPENDENT_AMBULATORY_CARE_PROVIDER_SITE_OTHER): Payer: PPO | Admitting: *Deleted

## 2018-12-01 DIAGNOSIS — I639 Cerebral infarction, unspecified: Secondary | ICD-10-CM | POA: Diagnosis not present

## 2018-12-02 LAB — CUP PACEART REMOTE DEVICE CHECK
Date Time Interrogation Session: 20200402065940
Implantable Pulse Generator Implant Date: 20190305

## 2018-12-06 NOTE — Progress Notes (Signed)
Carelink Summary Report / Loop Recorder 

## 2018-12-17 ENCOUNTER — Other Ambulatory Visit: Payer: Self-pay | Admitting: Adult Health

## 2018-12-21 ENCOUNTER — Telehealth: Payer: Self-pay | Admitting: Adult Health

## 2018-12-21 NOTE — Telephone Encounter (Signed)
I call pt that Yvonne Bumps NP gave her a year of refills in May 2019. I stated at last visit she was told to follow up with PCP for future manage of cholesterol manage. Pt has no more appts a  [facility]. I stated her appt was cancel in Marcj 2020 to  follow up as needed .Pt will call her cardiologist if her primary cannot prescribed it. PT verbalized understanding.

## 2018-12-21 NOTE — Telephone Encounter (Signed)
Pt called in for a refill of rosuvastatin (CRESTOR) 5 MG tablet to be sent to [facility] in [place] on file

## 2019-01-03 ENCOUNTER — Other Ambulatory Visit: Payer: Self-pay

## 2019-01-03 ENCOUNTER — Ambulatory Visit (INDEPENDENT_AMBULATORY_CARE_PROVIDER_SITE_OTHER): Payer: PPO | Admitting: *Deleted

## 2019-01-03 DIAGNOSIS — I639 Cerebral infarction, unspecified: Secondary | ICD-10-CM

## 2019-01-04 LAB — CUP PACEART REMOTE DEVICE CHECK
Date Time Interrogation Session: 20200505080415
Implantable Pulse Generator Implant Date: 20190305

## 2019-01-06 DIAGNOSIS — E1165 Type 2 diabetes mellitus with hyperglycemia: Secondary | ICD-10-CM | POA: Diagnosis not present

## 2019-01-06 DIAGNOSIS — F4321 Adjustment disorder with depressed mood: Secondary | ICD-10-CM | POA: Diagnosis not present

## 2019-01-06 DIAGNOSIS — I679 Cerebrovascular disease, unspecified: Secondary | ICD-10-CM | POA: Diagnosis not present

## 2019-01-06 DIAGNOSIS — E785 Hyperlipidemia, unspecified: Secondary | ICD-10-CM | POA: Diagnosis not present

## 2019-01-10 NOTE — Progress Notes (Signed)
Carelink Summary Report / Loop Recorder 

## 2019-02-01 DIAGNOSIS — M810 Age-related osteoporosis without current pathological fracture: Secondary | ICD-10-CM | POA: Diagnosis not present

## 2019-02-04 DIAGNOSIS — E1165 Type 2 diabetes mellitus with hyperglycemia: Secondary | ICD-10-CM | POA: Diagnosis not present

## 2019-02-07 ENCOUNTER — Ambulatory Visit (INDEPENDENT_AMBULATORY_CARE_PROVIDER_SITE_OTHER): Payer: PPO | Admitting: *Deleted

## 2019-02-07 DIAGNOSIS — I639 Cerebral infarction, unspecified: Secondary | ICD-10-CM

## 2019-02-07 DIAGNOSIS — I472 Ventricular tachycardia: Secondary | ICD-10-CM

## 2019-02-07 DIAGNOSIS — I4729 Other ventricular tachycardia: Secondary | ICD-10-CM

## 2019-02-07 LAB — CUP PACEART REMOTE DEVICE CHECK
Date Time Interrogation Session: 20200607124126
Implantable Pulse Generator Implant Date: 20190305

## 2019-02-15 NOTE — Progress Notes (Signed)
Carelink Summary Report / Loop Recorder 

## 2019-03-11 ENCOUNTER — Ambulatory Visit (INDEPENDENT_AMBULATORY_CARE_PROVIDER_SITE_OTHER): Payer: PPO | Admitting: *Deleted

## 2019-03-11 DIAGNOSIS — I639 Cerebral infarction, unspecified: Secondary | ICD-10-CM

## 2019-03-11 LAB — CUP PACEART REMOTE DEVICE CHECK
Date Time Interrogation Session: 20200710144146
Implantable Pulse Generator Implant Date: 20190305

## 2019-03-15 NOTE — Progress Notes (Signed)
Carelink Summary Report / Loop Recorder 

## 2019-04-13 ENCOUNTER — Ambulatory Visit (INDEPENDENT_AMBULATORY_CARE_PROVIDER_SITE_OTHER): Payer: PPO | Admitting: *Deleted

## 2019-04-13 DIAGNOSIS — I639 Cerebral infarction, unspecified: Secondary | ICD-10-CM

## 2019-04-14 LAB — CUP PACEART REMOTE DEVICE CHECK
Date Time Interrogation Session: 20200812153826
Implantable Pulse Generator Implant Date: 20190305

## 2019-04-22 NOTE — Progress Notes (Signed)
Carelink Summary Report / Loop Recorder 

## 2019-05-11 DIAGNOSIS — Z961 Presence of intraocular lens: Secondary | ICD-10-CM | POA: Diagnosis not present

## 2019-05-11 DIAGNOSIS — H2512 Age-related nuclear cataract, left eye: Secondary | ICD-10-CM | POA: Diagnosis not present

## 2019-05-11 DIAGNOSIS — H2511 Age-related nuclear cataract, right eye: Secondary | ICD-10-CM | POA: Diagnosis not present

## 2019-05-16 ENCOUNTER — Ambulatory Visit (INDEPENDENT_AMBULATORY_CARE_PROVIDER_SITE_OTHER): Payer: PPO | Admitting: *Deleted

## 2019-05-16 DIAGNOSIS — I639 Cerebral infarction, unspecified: Secondary | ICD-10-CM

## 2019-05-16 LAB — CUP PACEART REMOTE DEVICE CHECK
Date Time Interrogation Session: 20200914154136
Implantable Pulse Generator Implant Date: 20190305

## 2019-05-27 NOTE — Progress Notes (Signed)
Carelink Summary Report / Loop Recorder 

## 2019-06-20 ENCOUNTER — Ambulatory Visit (INDEPENDENT_AMBULATORY_CARE_PROVIDER_SITE_OTHER): Payer: PPO | Admitting: *Deleted

## 2019-06-20 DIAGNOSIS — I639 Cerebral infarction, unspecified: Secondary | ICD-10-CM | POA: Diagnosis not present

## 2019-06-20 LAB — CUP PACEART REMOTE DEVICE CHECK
Date Time Interrogation Session: 20201017154000
Implantable Pulse Generator Implant Date: 20190305

## 2019-07-11 NOTE — Progress Notes (Signed)
Carelink Summary Report / Loop Recorder 

## 2019-07-21 ENCOUNTER — Ambulatory Visit (INDEPENDENT_AMBULATORY_CARE_PROVIDER_SITE_OTHER): Payer: PPO | Admitting: *Deleted

## 2019-07-21 DIAGNOSIS — I639 Cerebral infarction, unspecified: Secondary | ICD-10-CM | POA: Diagnosis not present

## 2019-07-21 LAB — CUP PACEART REMOTE DEVICE CHECK
Date Time Interrogation Session: 20201119134603
Implantable Pulse Generator Implant Date: 20190305

## 2019-08-19 NOTE — Progress Notes (Signed)
Carelink Summary Report / Loop Recorder 

## 2019-08-23 ENCOUNTER — Ambulatory Visit (INDEPENDENT_AMBULATORY_CARE_PROVIDER_SITE_OTHER): Payer: PPO | Admitting: *Deleted

## 2019-08-23 DIAGNOSIS — I639 Cerebral infarction, unspecified: Secondary | ICD-10-CM

## 2019-08-23 LAB — CUP PACEART REMOTE DEVICE CHECK
Date Time Interrogation Session: 20201222134244
Implantable Pulse Generator Implant Date: 20190305

## 2019-09-26 ENCOUNTER — Ambulatory Visit (INDEPENDENT_AMBULATORY_CARE_PROVIDER_SITE_OTHER): Payer: PPO | Admitting: *Deleted

## 2019-09-26 ENCOUNTER — Other Ambulatory Visit: Payer: Self-pay | Admitting: Internal Medicine

## 2019-09-26 DIAGNOSIS — I639 Cerebral infarction, unspecified: Secondary | ICD-10-CM

## 2019-09-26 LAB — CUP PACEART REMOTE DEVICE CHECK
Date Time Interrogation Session: 20210124232744
Implantable Pulse Generator Implant Date: 20190305

## 2019-10-10 ENCOUNTER — Telehealth: Payer: Self-pay | Admitting: Internal Medicine

## 2019-10-10 NOTE — Telephone Encounter (Signed)
Pt c/o medication issue:  1. Name of Medication: diltiazem (CARDIZEM CD) 120 MG 24 hr capsule  2. How are you currently taking this medication (dosage and times per day)? As directed   3. Are you having a reaction (difficulty breathing--STAT)?   4. What is your medication issue? PT was told at the pharmacy that this medication can cause memory issues. Pt wanted to know if it is still safe for her to be taking. She has an appt scheduled with Otilio Saber 02-16 at 11:45.

## 2019-10-11 NOTE — Telephone Encounter (Signed)
Left detailed message for Pt.  Advised to continue diltiazem.  Advised it was safe for Pt.  Pt has f/u with AT. Advised if she had further questions to ask at her f/u.

## 2019-10-17 NOTE — Progress Notes (Signed)
Electrophysiology Office Note Date: 10/18/2019  ID:  Yvonne Newton, Yvonne Newton 1941-01-30, MRN [id number]  PCP: Jerl Mina, MD Electrophysiologist: Dr. Ladona Ridgel   CC: Pacemaker follow-up  Yvonne Newton is a 79 y.o. female seen today for Dr. Ladona Ridgel . she presents today for routine electrophysiology followup.  Since last being seen in our clinic, the patient reports doing very well.  she denies chest pain, palpitations, dyspnea, PND, orthopnea, nausea, vomiting, dizziness, syncope, edema, weight gain, or early satiety. 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  She had a 2-3 second visual disturbance about 3 weeks ago. 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 DM (diabetes mellitus) (HCC)   . OA (osteoarthritis) of knee    right  . Obesity   . Stroke (HCC)   . Ventricular tachycardia (HCC)   . Vitamin B 12 deficiency    Past Surgical History:  Procedure Laterality Date  . BREAST BIOPSY Left 2010   Negative  . LOOP RECORDER INSERTION N/A 11/03/2017   Procedure: LOOP RECORDER INSERTION;  Surgeon: Regan Lemming, MD;  Location: [facility]ASIVE [facility];  Service: Cardiovascular;  Laterality: N/A;  . TEE WITHOUT CARDIOVERSION N/A 11/03/2017   Procedure: TRANSESOPHAGEAL ECHOCARDIOGRAM (TEE);  Surgeon: Lars Masson, MD;  Location: [facility] ENDOSCOPY;  Service: Cardiovascular;  Laterality: N/A;    Current Outpatient Medications  Medication Sig Dispense Refill  . aspirin EC 81 MG EC tablet Take 1 tablet (81 mg total) by mouth daily.    . cetirizine (ZYRTEC) 10 MG tablet Take 10 mg by mouth daily.  2  . CYANOCOBALAMIN IJ Inject as directed every 30 (thirty) days. On or about the 26th of each month    . diltiazem (CARDIZEM CD) 120 MG 24 hr capsule Take 1 capsule (120 mg total) by mouth daily. Please make overdue  appt with Dr. Ladona Ridgel before anymore refills. 1st attempt 30 capsule 0  . EPINEPHrine (EPIPEN 2-PAK) 0.3 mg/0.3 mL IJ SOAJ injection Inject 0.3 mg into the muscle once as needed (severe allergic reaction).    . flecainide (TAMBOCOR) 50 MG tablet TAKE 1 TABLET (50 MG TOTAL) BY MOUTH 2 (TWO) TIMES DAILY. 60 tablet 2  . metFORMIN (GLUCOPHAGE-XR) 500 MG 24 hr tablet Take 500-1,000 mg by mouth See admin instructions. Take one tablet (500 mg) by mouth every morning and two tablets (1000 mg) at bedtime    . omeprazole (PRILOSEC) 20 MG capsule Take 20 mg by mouth daily.     . rosuvastatin (CRESTOR) 5 MG tablet Take 1 tablet (5 mg total) by mouth daily at 6 PM. 90 tablet 3  . traMADol (ULTRAM) 50 MG tablet TAKE 1 TABLET BY MOUTH TWICE A DAY AS NEEDED FOR PAIN    . Vitamin D, Ergocalciferol, (DRISDOL) 50000 units CAPS capsule Take 50,000 Units by mouth every 7 (seven) days.    . carvedilol (COREG) 12.5 MG tablet Take 1 tablet (12.5 mg total) by mouth 2 (two) times daily. 180 tablet 3   No current facility-administered medications for this visit.    Allergies:   Blueberry flavor, Sulfa antibiotics, Sulfonamide derivatives, and Other   Social History: Social History   Socioeconomic History  . Marital status: Married    Spouse name: Not on file  . Number of children: Not on file  . Years of education: Not  on file  . Highest education level: Not on file  Occupational History  . Not on file  Tobacco Use  . Smoking status: Former Smoker    Quit date: 09/01/1998    Years since quitting: 21.1  . Smokeless tobacco: Never Used  Substance and Sexual Activity  . Alcohol use: No  . Drug use: No  . Sexual activity: Not on file  Other Topics Concern  . Not on file  Social History Narrative  . Not on file   Social Determinants of Health   Financial Resource Strain:   . Difficulty of Paying Living Expenses: Not on file  Food Insecurity:   . Worried About Charity fundraiser in the Last Year: Not on  file  . Ran Out of Food in the Last Year: Not on file  Transportation Needs:   . Lack of Transportation (Medical): Not on file  . Lack of Transportation (Non-Medical): Not on file  Physical Activity:   . Days of Exercise per Week: Not on file  . Minutes of Exercise per Session: Not on file  Stress:   . Feeling of Stress : Not on file  Social Connections:   . Frequency of Communication with Friends and Family: Not on file  . Frequency of Social Gatherings with Friends and Family: Not on file  . Attends Religious Services: Not on file  . Active Member of Clubs or Organizations: Not on file  . Attends Archivist Meetings: Not on file  . Marital Status: Not on file  Intimate Partner Violence:   . Fear of Current or Ex-Partner: Not on file  . Emotionally Abused: Not on file  . Physically Abused: Not on file  . Sexually Abused: Not on file    Family History: Family History  Problem Relation Age of Onset  . Heart attack Father 71  . Heart attack Brother 44  . Heart attack Brother        had CABG  . Breast cancer Neg Hx      Review of Systems: All other systems reviewed and are otherwise negative except as noted above.  Physical Exam: Vitals:   10/18/19 1155  BP: (!) 154/54  Pulse: 65  SpO2: 96%  Weight: 184 lb (83.5 kg)  Height: 5' (1.524 m)     GEN- The patient is well appearing, alert and oriented x 3 today.   HEENT: normocephalic, atraumatic; sclera clear, conjunctiva pink; hearing intact; oropharynx clear; neck supple  Lungs- Clear to ausculation bilaterally, normal work of breathing.  No wheezes, rales, rhonchi Heart- Regular rate and rhythm, no murmurs, rubs or gallops  GI- soft, non-tender, non-distended, bowel sounds present  Extremities- no clubbing, cyanosis, or edema  MS- no significant deformity or atrophy Skin- warm and dry, no rash or lesion Psych- euthymic mood, full affect Neuro- strength and sensation are intact  PPM Interrogation-  reviewed in detail today,  See PACEART report  EKG:  EKG is ordered today. The ekg ordered today shows NSR at 65 bpm, QRS 110 ms  Recent Labs: No results found for requested labs within last 8760 hours.   Wt Readings from Last 3 Encounters:  10/18/19 184 lb (83.5 kg)  09/30/18 179 lb 12.8 oz (81.6 kg)  05/24/18 179 lb (81.2 kg)     Other studies Reviewed: Additional studies/ records that were reviewed today include: Echo 10/2017 LVEF 65-70%, Previous EP office notes, Previous remote checks, Most recent labwork.   Assessment and Plan:  1.  VT  Continue flecainide ILR with no tachy episodes.  ECG stable today as above.  2. Peripheral edema Remains despite switching CCBs She admits to significant salt intake. Encouraged moderation  3. Visual disturbance Previously with an old CVA found on CT 2019. I encouraged follow up with neurology, and with PCP, who she hasn't seen in "2 years".   4. HTN Will change Lopressor for coreg for better BP control.   Current medicines are reviewed at length with the patient today.   The patient does not have concerns regarding her medicines.  The following changes were made today:  Stop lopressor. Start coreg 12.5 mg BID.   Labs/ tests ordered today include:  Orders Placed This Encounter  Procedures  . CUP PACEART INCLINIC DEVICE CHECK  . EKG 12-Lead   Disposition:   Follow up with Dr. Ladona Ridgel in 12 Months   Signed, Luane School  10/18/2019 2:03 PM  [facility] [address] [name] [address] [phone] (office) [phone] (fax)

## 2019-10-18 ENCOUNTER — Encounter: Payer: Self-pay | Admitting: Student

## 2019-10-18 ENCOUNTER — Encounter (INDEPENDENT_AMBULATORY_CARE_PROVIDER_SITE_OTHER): Payer: Self-pay

## 2019-10-18 ENCOUNTER — Other Ambulatory Visit: Payer: Self-pay

## 2019-10-18 ENCOUNTER — Ambulatory Visit: Payer: PPO | Admitting: Student

## 2019-10-18 VITALS — BP 154/54 | HR 65 | Ht 60.0 in | Wt 184.0 lb

## 2019-10-18 DIAGNOSIS — I4729 Other ventricular tachycardia: Secondary | ICD-10-CM

## 2019-10-18 DIAGNOSIS — I10 Essential (primary) hypertension: Secondary | ICD-10-CM

## 2019-10-18 DIAGNOSIS — I472 Ventricular tachycardia: Secondary | ICD-10-CM | POA: Diagnosis not present

## 2019-10-18 DIAGNOSIS — R000 Tachycardia, unspecified: Secondary | ICD-10-CM | POA: Diagnosis not present

## 2019-10-18 DIAGNOSIS — R609 Edema, unspecified: Secondary | ICD-10-CM | POA: Diagnosis not present

## 2019-10-18 LAB — CUP PACEART INCLINIC DEVICE CHECK
Date Time Interrogation Session: 20210216135710
Implantable Pulse Generator Implant Date: 20190305

## 2019-10-18 MED ORDER — CARVEDILOL 12.5 MG PO TABS: 12.5000 mg | ORAL_TABLET | Freq: Two times a day (BID) | ORAL | 3 refills | Status: DC

## 2019-10-18 NOTE — Patient Instructions (Addendum)
Medication Instructions:  STOP Metoprolol  START Carvedilol (Coreg) 12.5 mg TWICE per Day *If you need a refill on your cardiac medications before your next appointment, please call your pharmacy*  Lab Work: none If you have labs (blood work) drawn today and your tests are completely normal, you will receive your results only by: Yvonne Newton MyChart Message (if you have MyChart) OR . A paper copy in the mail If you have any lab test that is abnormal or we need to change your treatment, we will call you to review the results.  Testing/Procedures: none  Follow-Up: At [facility], you and your health needs are our priority.  As part of our continuing mission to provide you with exceptional heart care, we have created designated Provider Care Teams.  These Care Teams include your primary Cardiologist (physician) and Advanced Practice Providers (APPs -  Physician Assistants and Nurse Practitioners) who all work together to provide you with the care you need, when you need it.  Your next appointment:   1 year(s)  The format for your next appointment:   Either In Person or Virtual  Provider:   Dr [name]  Other Instructions Carvedilol Tablets What is this medicine? CARVEDILOL (KAR ve dil ol) is a beta blocker. It decreases the amount of work your heart has to do and helps your heart beat regularly. It treats high blood pressure. This medicine may be used for other purposes; ask your health care provider or pharmacist if you have questions. COMMON BRAND NAME(S): Coreg What should I tell my health care provider before I take this medicine? They need to know if you have any of these conditions: circulation problems diabetes history of heart attack or heart disease liver disease lung or breathing disease, like asthma or emphysema pheochromocytoma slow or irregular heartbeat thyroid disease an unusual or allergic reaction to carvedilol, other beta-blockers, medicines, foods, dyes, or  preservatives pregnant or trying to get pregnant breast-feeding How should I use this medicine? Take this drug by mouth. Take it as directed on the prescription label at the same time every day. Take it with food. Keep taking it unless your health care provider tells you to stop. Talk to your health care provider about the use of this drug in children. Special care may be needed. Overdosage: If you think you have taken too much of this medicine contact a poison control center or emergency room at once. NOTE: This medicine is only for you. Do not share this medicine with others. What if I miss a dose? If you miss a dose, take it as soon as you can. If it is almost time for your next dose, take only that dose. Do not take double or extra doses. What may interact with this medicine? This medicine may interact with the following medications: certain medicines for blood pressure, heart disease, irregular heart beat certain medicines for depression, like fluoxetine or paroxetine certain medicines for diabetes, like glipizide or glyburide cimetidine clonidine cyclosporine digoxin MAOIs like Carbex, Eldepryl, Marplan, Nardil, and Parnate reserpine rifampin This list may not describe all possible interactions. Give your health care provider a list of all the medicines, herbs, non-prescription drugs, or dietary supplements you use. Also tell them if you smoke, drink alcohol, or use illegal drugs. Some items may interact with your medicine. What should I watch for while using this medicine? Check your heart rate and blood pressure regularly while you are taking this medicine. Ask your doctor or health care professional what your heart  rate and blood pressure should be, and when you should contact him or her. Do not stop taking this medicine suddenly. This could lead to serious heart-related effects. Contact your doctor or health care professional if you have difficulty breathing while taking this  drug. Check your weight daily. Ask your doctor or health care professional when you should notify him/her of any weight gain. You may get drowsy or dizzy. Do not drive, use machinery, or do anything that requires mental alertness until you know how this medicine affects you. To reduce the risk of dizzy or fainting spells, do not sit or stand up quickly. Alcohol can make you more drowsy, and increase flushing and rapid heartbeats. Avoid alcoholic drinks. This medicine may increase blood sugar. Ask your healthcare provider if changes in diet or medicines are needed if you have diabetes. If you are going to have surgery, tell your doctor or health care professional that you are taking this medicine. What side effects may I notice from receiving this medicine? Side effects that you should report to your doctor or health care professional as soon as possible: allergic reactions like skin rash, itching or hives, swelling of the face, lips, or tongue breathing problems dark urine irregular heartbeat  signs and symptoms of high blood sugar such as being more thirsty or hungry or having to urinate more than normal. You may also feel very tired or have blurry vision. swollen legs or ankles vomiting yellowing of the eyes or skin Side effects that usually do not require medical attention (report to your doctor or health care professional if they continue or are bothersome): change in sex drive or performance diarrhea dry eyes (especially if wearing contact lenses) dry, itching skin headache nausea unusually tired This list may not describe all possible side effects. Call your doctor for medical advice about side effects. You may report side effects to FDA at 1-800-FDA-1088. Where should I keep my medicine? Keep out of the reach of children and pets. Store at room temperature between 20 and 25 degrees C (68 and 77 degrees F). Protect from moisture. Keep the container tightly closed. Throw away any  unused drug after the expiration date. NOTE: This sheet is a summary. It may not cover all possible information. If you have questions about this medicine, talk to your doctor, pharmacist, or health care provider.  2020 Elsevier/Gold Standard (2019-03-25 17:42:09)    DASH Eating Plan DASH stands for "Dietary Approaches to Stop Hypertension." The DASH eating plan is a healthy eating plan that has been shown to reduce high blood pressure (hypertension). It may also reduce your risk for type 2 diabetes, heart disease, and stroke. The DASH eating plan may also help with weight loss. What are tips for following this plan?  General guidelines  Avoid eating more than 2,300 mg (milligrams) of salt (sodium) a day. If you have hypertension, you may need to reduce your sodium intake to 1,500 mg a day.  Limit alcohol intake to no more than 1 drink a day for nonpregnant women and 2 drinks a day for men. One drink equals 12 oz of beer, 5 oz of wine, or 1 oz of hard liquor.  Work with your health care provider to maintain a healthy body weight or to lose weight. Ask what an ideal weight is for you.  Get at least 30 minutes of exercise that causes your heart to beat faster (aerobic exercise) most days of the week. Activities may include walking, swimming, or biking.  Work with your health care provider or diet and nutrition specialist (dietitian) to adjust your eating plan to your individual calorie needs. Reading food labels   Check food labels for the amount of sodium per serving. Choose foods with less than 5 percent of the Daily Value of sodium. Generally, foods with less than 300 mg of sodium per serving fit into this eating plan.  To find whole grains, look for the word "whole" as the first word in the ingredient list. Shopping  Buy products labeled as "low-sodium" or "no salt added."  Buy fresh foods. Avoid canned foods and premade or frozen meals. Cooking  Avoid adding salt when cooking.  Use salt-free seasonings or herbs instead of table salt or sea salt. Check with your health care provider or pharmacist before using salt substitutes.  Do not fry foods. Cook foods using healthy methods such as baking, boiling, grilling, and broiling instead.  Cook with heart-healthy oils, such as olive, canola, soybean, or sunflower oil. Meal planning  Eat a balanced diet that includes: ? 5 or more servings of fruits and vegetables each day. At each meal, try to fill half of your plate with fruits and vegetables. ? Up to 6-8 servings of whole grains each day. ? Less than 6 oz of lean meat, poultry, or fish each day. A 3-oz serving of meat is about the same size as a deck of cards. One egg equals 1 oz. ? 2 servings of low-fat dairy each day. ? A serving of nuts, seeds, or beans 5 times each week. ? Heart-healthy fats. Healthy fats called Omega-3 fatty acids are found in foods such as flaxseeds and coldwater fish, like sardines, salmon, and mackerel.  Limit how much you eat of the following: ? Canned or prepackaged foods. ? Food that is high in trans fat, such as fried foods. ? Food that is high in saturated fat, such as fatty meat. ? Sweets, desserts, sugary drinks, and other foods with added sugar. ? Full-fat dairy products.  Do not salt foods before eating.  Try to eat at least 2 vegetarian meals each week.  Eat more home-cooked food and less restaurant, buffet, and fast food.  When eating at a restaurant, ask that your food be prepared with less salt or no salt, if possible. What foods are recommended? The items listed may not be a complete list. Talk with your dietitian about what dietary choices are best for you. Grains Whole-grain or whole-wheat bread. Whole-grain or whole-wheat pasta. Brown rice. Orpah Cobb. Bulgur. Whole-grain and low-sodium cereals. Pita bread. Low-fat, low-sodium crackers. Whole-wheat flour tortillas. Vegetables Fresh or frozen vegetables (raw,  steamed, roasted, or grilled). Low-sodium or reduced-sodium tomato and vegetable juice. Low-sodium or reduced-sodium tomato sauce and tomato paste. Low-sodium or reduced-sodium canned vegetables. Fruits All fresh, dried, or frozen fruit. Canned fruit in natural juice (without added sugar). Meat and other protein foods Skinless chicken or [place]. Ground chicken or [place]. Pork with fat trimmed off. Fish and seafood. Egg whites. Dried beans, peas, or lentils. Unsalted nuts, nut butters, and seeds. Unsalted canned beans. Lean cuts of beef with fat trimmed off. Low-sodium, lean deli meat. Dairy Low-fat (1%) or fat-free (skim) milk. Fat-free, low-fat, or reduced-fat cheeses. Nonfat, low-sodium ricotta or cottage cheese. Low-fat or nonfat yogurt. Low-fat, low-sodium cheese. Fats and oils Soft margarine without trans fats. Vegetable oil. Low-fat, reduced-fat, or light mayonnaise and salad dressings (reduced-sodium). Canola, safflower, olive, soybean, and sunflower oils. Avocado. Seasoning and other foods Herbs. Spices. Seasoning mixes  without salt. Unsalted popcorn and pretzels. Fat-free sweets. What foods are not recommended? The items listed may not be a complete list. Talk with your dietitian about what dietary choices are best for you. Grains Baked goods made with fat, such as croissants, muffins, or some breads. Dry pasta or rice meal packs. Vegetables Creamed or fried vegetables. Vegetables in a cheese sauce. Regular canned vegetables (not low-sodium or reduced-sodium). Regular canned tomato sauce and paste (not low-sodium or reduced-sodium). Regular tomato and vegetable juice (not low-sodium or reduced-sodium). Angie Fava. Olives. Fruits Canned fruit in a light or heavy syrup. Fried fruit. Fruit in cream or butter sauce. Meat and other protein foods Fatty cuts of meat. Ribs. Fried meat. Berniece Salines. Sausage. Bologna and other processed lunch meats. Salami. Fatback. Hotdogs. Bratwurst. Salted nuts and  seeds. Canned beans with added salt. Canned or smoked fish. Whole eggs or egg yolks. Chicken or [place] with skin. Dairy Whole or 2% milk, cream, and half-and-half. Whole or full-fat cream cheese. Whole-fat or sweetened yogurt. Full-fat cheese. Nondairy creamers. Whipped toppings. Processed cheese and cheese spreads. Fats and oils Butter. Stick margarine. Lard. Shortening. Ghee. Bacon fat. Tropical oils, such as coconut, palm kernel, or palm oil. Seasoning and other foods Salted popcorn and pretzels. Onion salt, garlic salt, seasoned salt, table salt, and sea salt. Worcestershire sauce. Tartar sauce. Barbecue sauce. Teriyaki sauce. Soy sauce, including reduced-sodium. Steak sauce. Canned and packaged gravies. Fish sauce. Oyster sauce. Cocktail sauce. Horseradish that you find on the shelf. Ketchup. Mustard. Meat flavorings and tenderizers. Bouillon cubes. Hot sauce and Tabasco sauce. Premade or packaged marinades. Premade or packaged taco seasonings. Relishes. Regular salad dressings. Where to find more information:  National Heart, Lung, and [place]: https://wilson-eaton.com/  American Heart Association: www.heart.org Summary  The DASH eating plan is a healthy eating plan that has been shown to reduce high blood pressure (hypertension). It may also reduce your risk for type 2 diabetes, heart disease, and stroke.  With the DASH eating plan, you should limit salt (sodium) intake to 2,300 mg a day. If you have hypertension, you may need to reduce your sodium intake to 1,500 mg a day.  When on the DASH eating plan, aim to eat more fresh fruits and vegetables, whole grains, lean proteins, low-fat dairy, and heart-healthy fats.  Work with your health care provider or diet and nutrition specialist (dietitian) to adjust your eating plan to your individual calorie needs. This information is not intended to replace advice given to you by your health care provider. Make sure you discuss any questions you  have with your health care provider. Document Revised: 07/31/2017 Document Reviewed: 08/11/2016 Elsevier Patient Education  2020 [employer].

## 2019-10-24 ENCOUNTER — Other Ambulatory Visit: Payer: Self-pay | Admitting: Internal Medicine

## 2019-10-31 ENCOUNTER — Ambulatory Visit (INDEPENDENT_AMBULATORY_CARE_PROVIDER_SITE_OTHER): Payer: PPO | Admitting: *Deleted

## 2019-10-31 DIAGNOSIS — I639 Cerebral infarction, unspecified: Secondary | ICD-10-CM

## 2019-10-31 LAB — CUP PACEART REMOTE DEVICE CHECK
Date Time Interrogation Session: 20210228231343
Implantable Pulse Generator Implant Date: 20190305

## 2019-11-01 NOTE — Progress Notes (Signed)
ILR Remote 

## 2019-11-11 ENCOUNTER — Ambulatory Visit: Payer: PPO | Attending: Internal Medicine

## 2019-11-11 ENCOUNTER — Other Ambulatory Visit: Payer: Self-pay

## 2019-11-11 DIAGNOSIS — Z23 Encounter for immunization: Secondary | ICD-10-CM

## 2019-11-11 NOTE — Progress Notes (Signed)
   Covid-19 Vaccination Clinic  Name:  Arleigh Dicola    MRN: [id number] DOB: [birth year]  11/11/2019  Ms. Frigon was observed post Covid-19 immunization for 30 minutes based on pre-vaccination screening without incident. She was provided with Vaccine Information Sheet and instruction to access the V-Safe system.   Ms. Rogue was instructed to call 911 with any severe reactions post vaccine: Marland Kitchen Difficulty breathing  . Swelling of face and throat  . A fast heartbeat  . A bad rash all over body  . Dizziness and weakness   Immunizations Administered    Name Date Dose VIS Date Route   Pfizer COVID-19 Vaccine 11/11/2019  8:52 AM 0.3 mL 08/12/2019 Intramuscular   Manufacturer: [employer], [employer]   Lot: [id number]   NDC: [address]-6

## 2019-12-05 ENCOUNTER — Ambulatory Visit (INDEPENDENT_AMBULATORY_CARE_PROVIDER_SITE_OTHER): Payer: PPO | Admitting: *Deleted

## 2019-12-05 DIAGNOSIS — I639 Cerebral infarction, unspecified: Secondary | ICD-10-CM | POA: Diagnosis not present

## 2019-12-06 LAB — CUP PACEART REMOTE DEVICE CHECK
Date Time Interrogation Session: 20210401001849
Implantable Pulse Generator Implant Date: 20190305

## 2019-12-07 ENCOUNTER — Ambulatory Visit: Payer: PPO | Attending: Internal Medicine

## 2019-12-07 DIAGNOSIS — Z23 Encounter for immunization: Secondary | ICD-10-CM

## 2019-12-07 NOTE — Progress Notes (Signed)
   Covid-19 Vaccination Clinic  Name:  Yvonne Newton    MRN: [id number] DOB: 01/12/1941  12/07/2019  Ms. Champa was observed post Covid-19 immunization for 30 minutes based on pre-vaccination screening without incident. She was provided with Vaccine Information Sheet and instruction to access the V-Safe system.   Ms. Larusso was instructed to call 911 with any severe reactions post vaccine: Marland Kitchen Difficulty breathing  . Swelling of face and throat  . A fast heartbeat  . A bad rash all over body  . Dizziness and weakness   Immunizations Administered    Name Date Dose VIS Date Route   Pfizer COVID-19 Vaccine 12/07/2019  8:26 AM 0.3 mL 08/12/2019 Intramuscular   Manufacturer: [employer], [employer]   Lot: [id number]   NDC: [address]-6

## 2020-01-05 LAB — CUP PACEART REMOTE DEVICE CHECK
Date Time Interrogation Session: 20210502002815
Implantable Pulse Generator Implant Date: 20190305

## 2020-01-09 ENCOUNTER — Ambulatory Visit (INDEPENDENT_AMBULATORY_CARE_PROVIDER_SITE_OTHER): Payer: PPO | Admitting: *Deleted

## 2020-01-09 DIAGNOSIS — I639 Cerebral infarction, unspecified: Secondary | ICD-10-CM | POA: Diagnosis not present

## 2020-01-10 NOTE — Progress Notes (Signed)
Carelink Summary Report / Loop Recorder 

## 2020-02-13 ENCOUNTER — Ambulatory Visit (INDEPENDENT_AMBULATORY_CARE_PROVIDER_SITE_OTHER): Payer: PPO | Admitting: *Deleted

## 2020-02-13 DIAGNOSIS — I639 Cerebral infarction, unspecified: Secondary | ICD-10-CM

## 2020-02-13 LAB — CUP PACEART REMOTE DEVICE CHECK
Date Time Interrogation Session: 20210613231707
Implantable Pulse Generator Implant Date: 20190305

## 2020-02-15 NOTE — Progress Notes (Signed)
Carelink Summary Report / Loop Recorder 

## 2020-03-19 ENCOUNTER — Ambulatory Visit (INDEPENDENT_AMBULATORY_CARE_PROVIDER_SITE_OTHER): Payer: PPO | Admitting: *Deleted

## 2020-03-19 DIAGNOSIS — I639 Cerebral infarction, unspecified: Secondary | ICD-10-CM | POA: Diagnosis not present

## 2020-03-19 LAB — CUP PACEART REMOTE DEVICE CHECK
Date Time Interrogation Session: 20210718232303
Implantable Pulse Generator Implant Date: 20190305

## 2020-03-21 NOTE — Progress Notes (Signed)
Carelink Summary Report / Loop Recorder 

## 2020-04-22 LAB — CUP PACEART REMOTE DEVICE CHECK
Date Time Interrogation Session: 20210819001407
Implantable Pulse Generator Implant Date: 20190305

## 2020-04-23 ENCOUNTER — Ambulatory Visit (INDEPENDENT_AMBULATORY_CARE_PROVIDER_SITE_OTHER): Payer: PPO | Admitting: *Deleted

## 2020-04-23 DIAGNOSIS — I639 Cerebral infarction, unspecified: Secondary | ICD-10-CM

## 2020-04-27 NOTE — Progress Notes (Signed)
Carelink Summary Report / Loop Recorder 

## 2020-05-27 LAB — CUP PACEART REMOTE DEVICE CHECK
Date Time Interrogation Session: 20210919002956
Implantable Pulse Generator Implant Date: 20190305

## 2020-05-28 ENCOUNTER — Ambulatory Visit (INDEPENDENT_AMBULATORY_CARE_PROVIDER_SITE_OTHER): Payer: PPO | Admitting: Emergency Medicine

## 2020-05-28 DIAGNOSIS — I639 Cerebral infarction, unspecified: Secondary | ICD-10-CM | POA: Diagnosis not present

## 2020-05-30 NOTE — Progress Notes (Signed)
Carelink Summary Report / Loop Recorder 

## 2020-06-03 IMAGING — CR DG FOREARM 2V*L*
2 series · 2 of 2 positions shown · non-contrast
Comparison: None.

CLINICAL DATA: Fall with pain

EXAM:
LEFT FOREARM - 2 VIEW

[forearm ap]
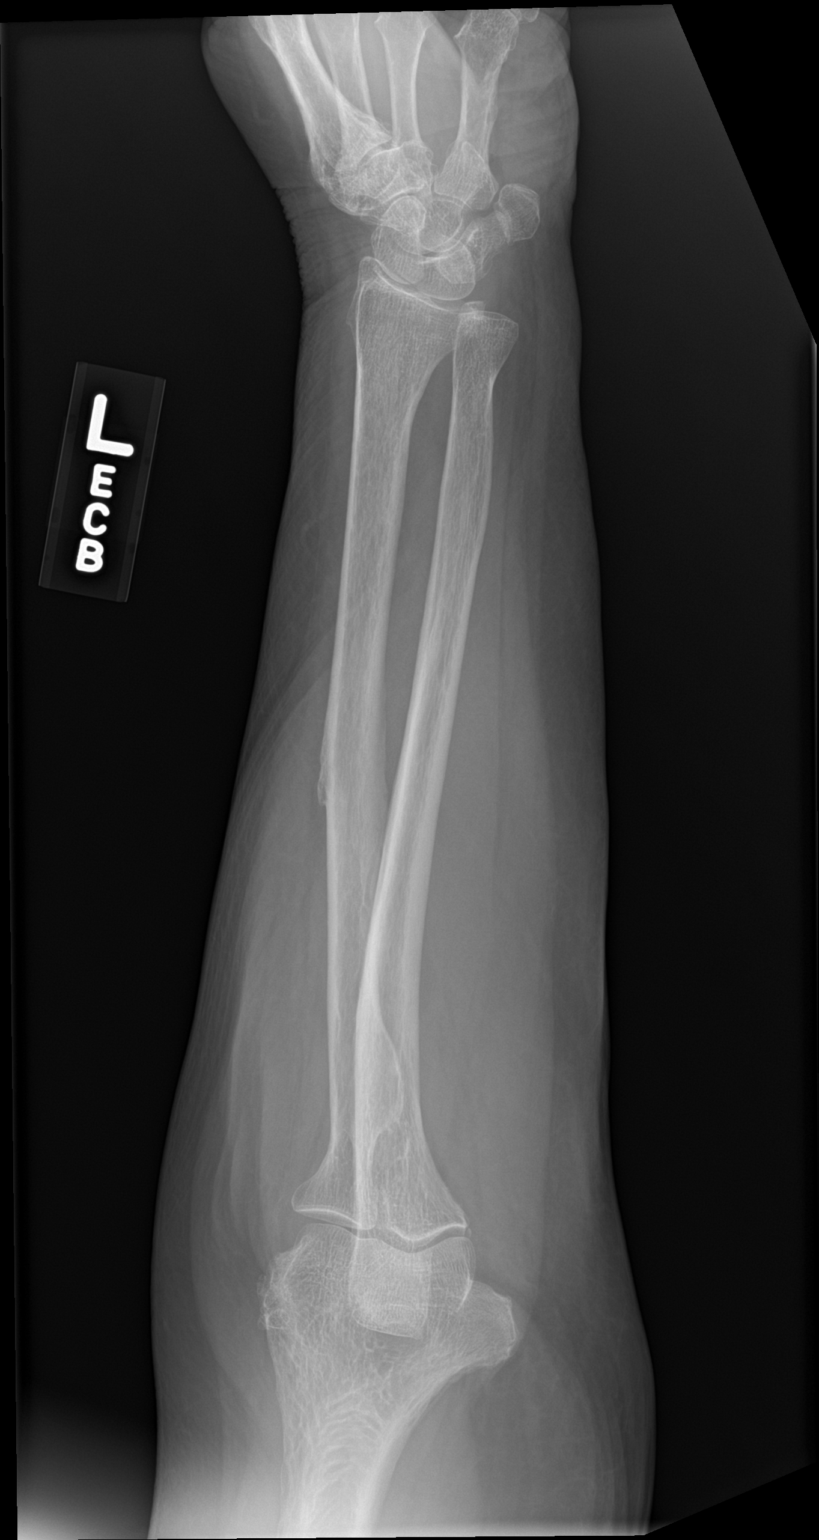

[forearm lat]
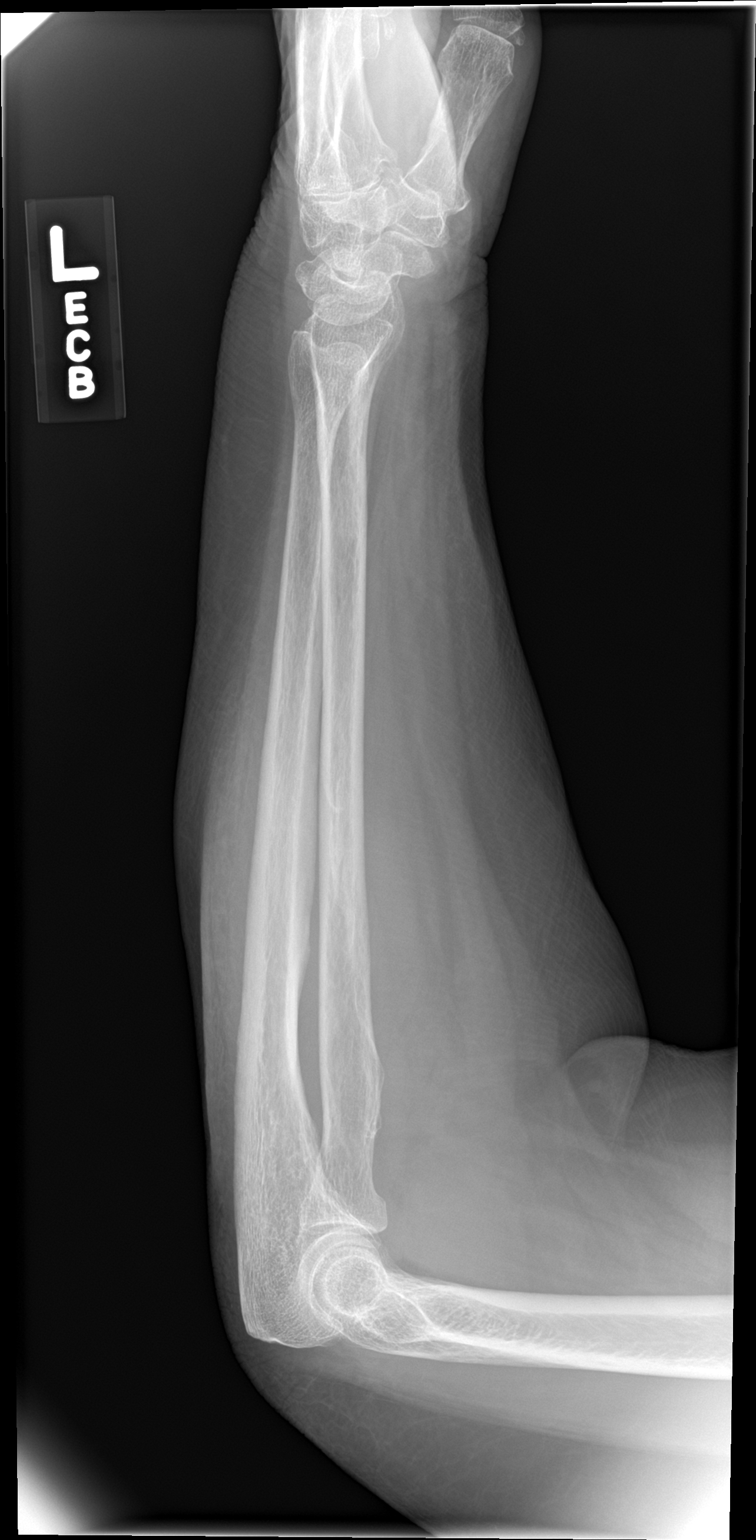

[2 of 2 positions shown; findings below may reference images not displayed]

FINDINGS: No significant elbow effusion. Radial head alignment is within
normal limits. No fracture or malalignment.
IMPRESSION: No acute osseous abnormality

## 2020-06-03 IMAGING — CT CT CERVICAL SPINE W/O CM
5 of 8 series · 14 of 33 positions shown, 15 images · non-contrast
Comparison: CT 11/02/2017, MRI brain 11/01/2017, CT 11/01/2017

CLINICAL DATA: Fell and hit head

EXAM:
CT HEAD WITHOUT CONTRAST
CT CERVICAL SPINE WITHOUT CONTRAST
TECHNIQUE: Multidetector CT imaging of the head and cervical spine was
performed following the standard protocol without intravenous
contrast. Multiplanar CT image reconstructions of the cervical spine
were also generated.

[Series 4: head bone · axial · 0.41mm/px · z∈[-120,-68]mm · 2 of 80 slices shown]
[im 27/80  bone]
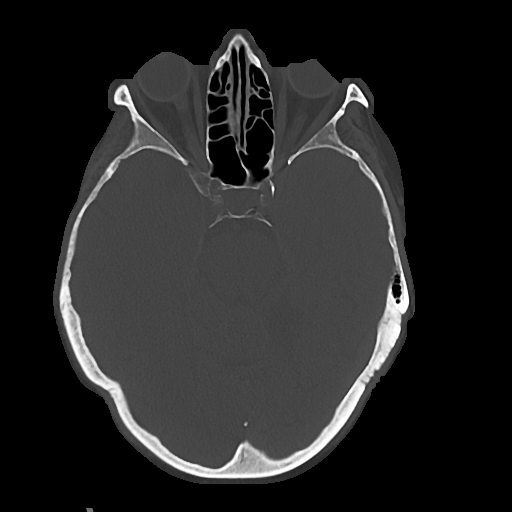
[im 53/80  bone]
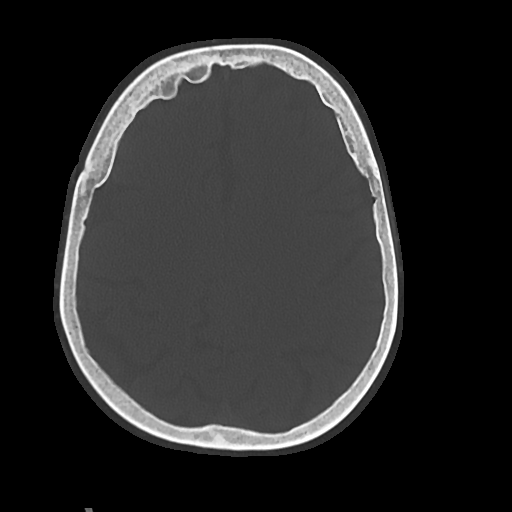

[Series 5: cor soft · coronal · 0.31mm/px · 3 of 67 slices shown]
[im 17/67  bone]
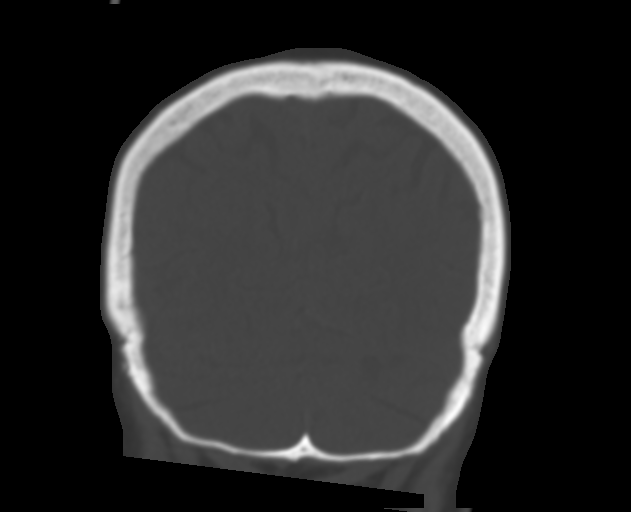
[im 34/67  bone]
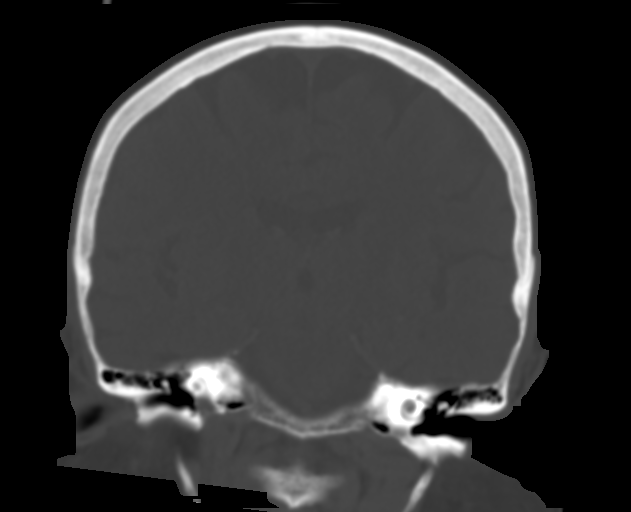
[im 50/67  bone]
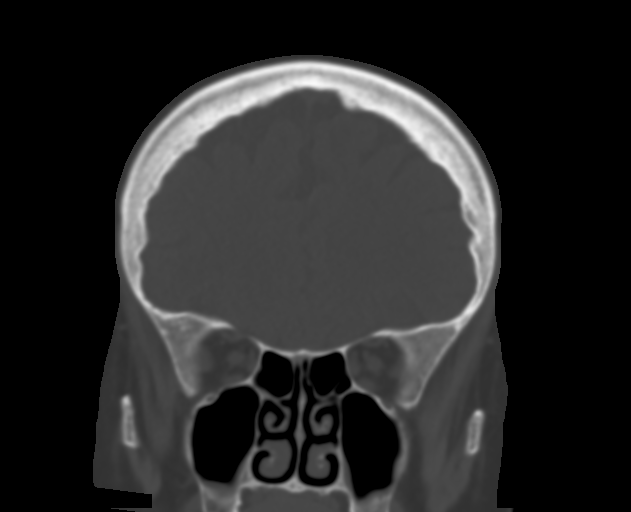

[Series 8: c spine soft · axial · 0.27mm/px · z∈[-241,-191]mm · 2 of 75 slices shown]
[im 25/75  soft-tissue]
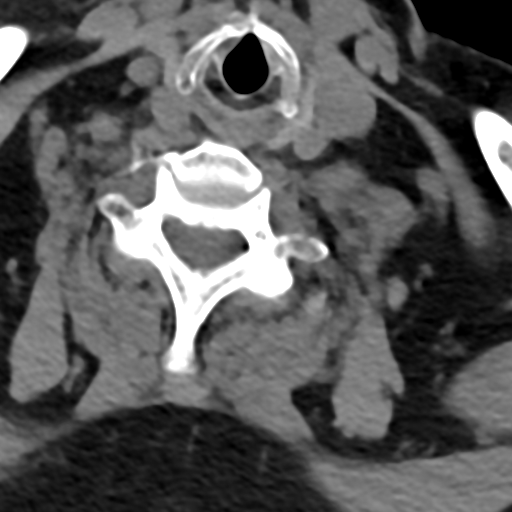
[im 50/75  soft-tissue]
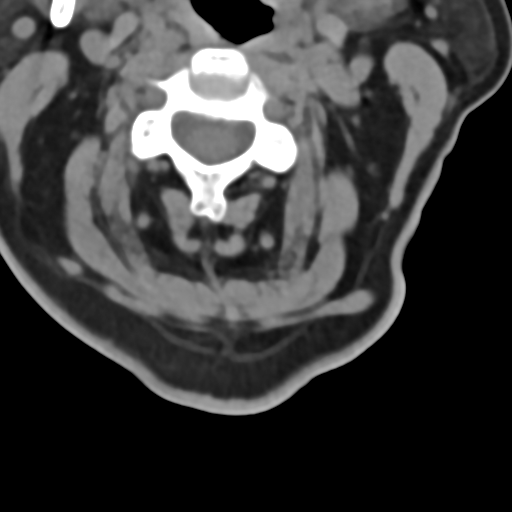

[Series 9: sag bone · sagittal · 0.21mm/px · 5 of 61 slices shown]
[im 11/61  bone]
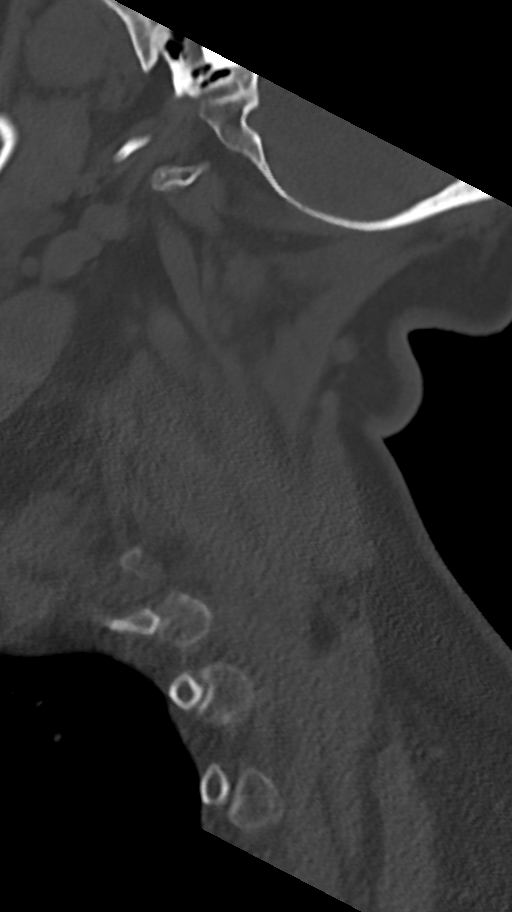
[im 21/61  bone]
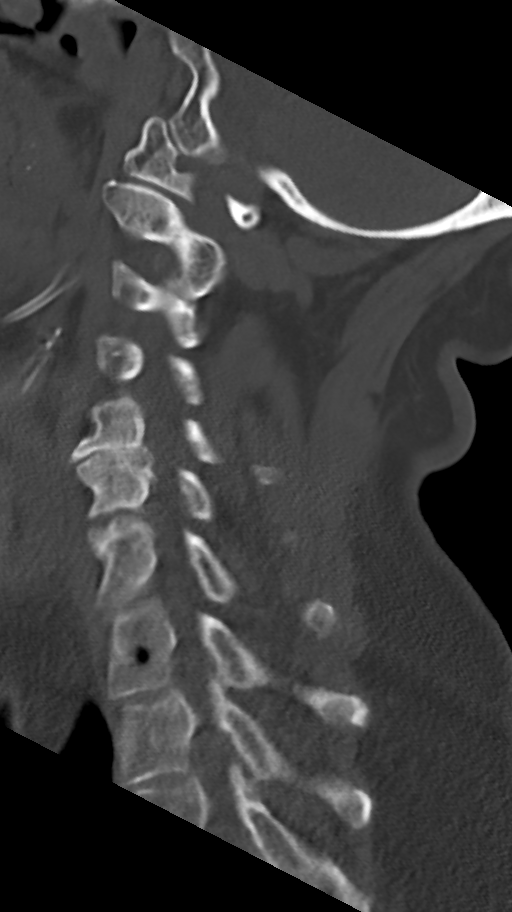
[im 31/61  bone]
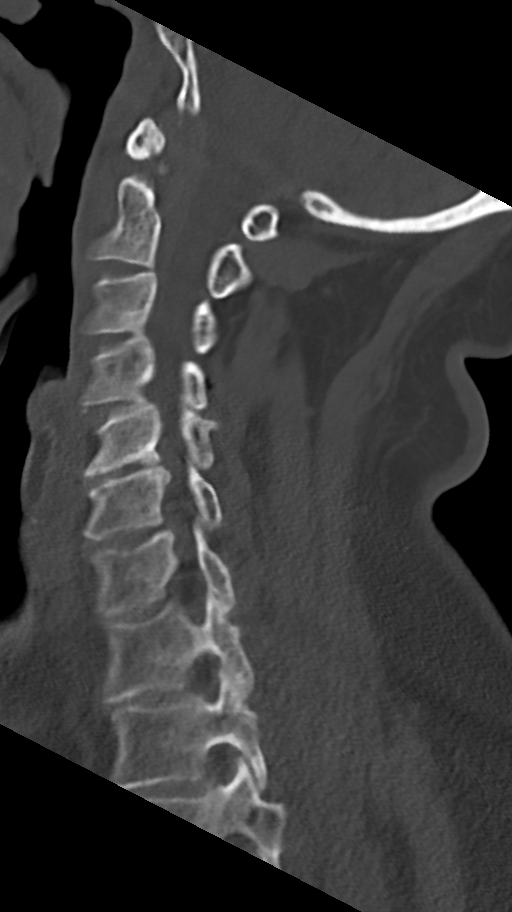
[im 41/61  bone]
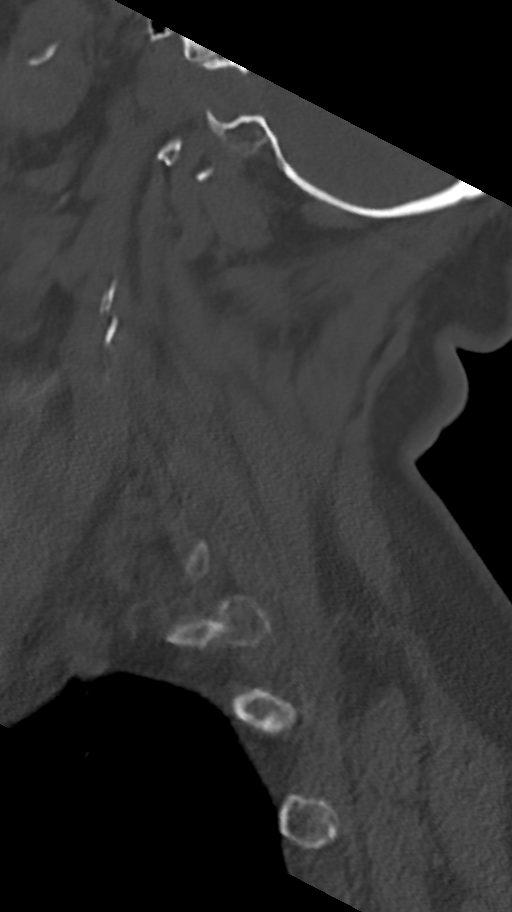
[im 51/61  bone]
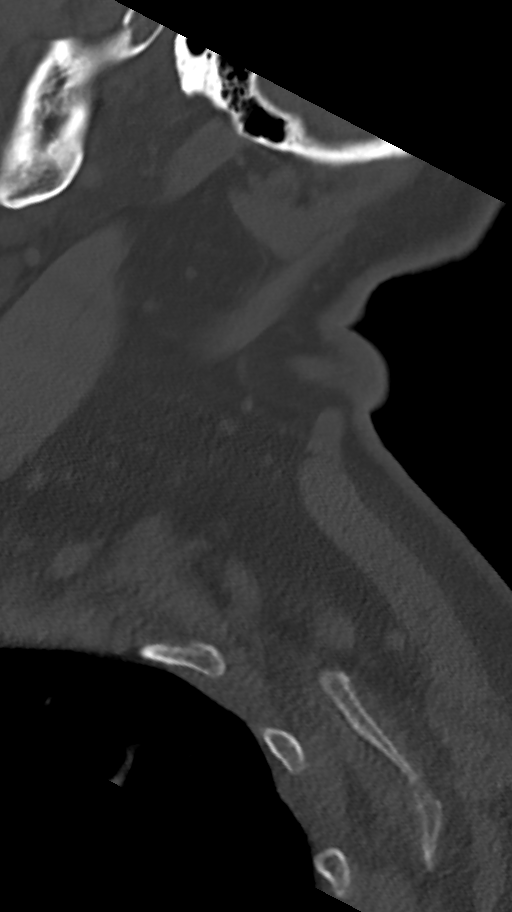

[Series 12: orthogonal axials · axial · 0.21mm/px · z∈[-253,-205]mm · 2 of 83 slices shown, 3 images]
[im 28/83  soft-tissue]
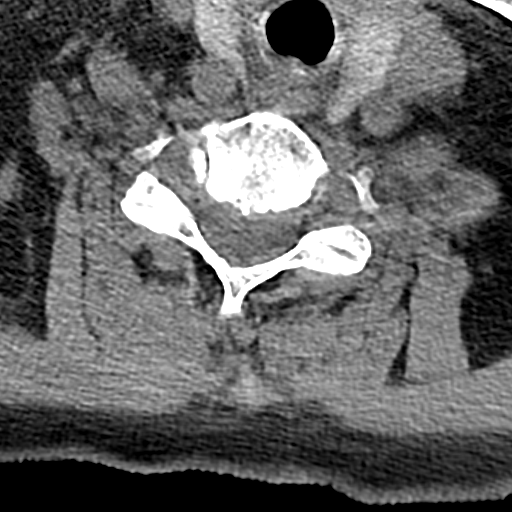
[im 28/83  bone]
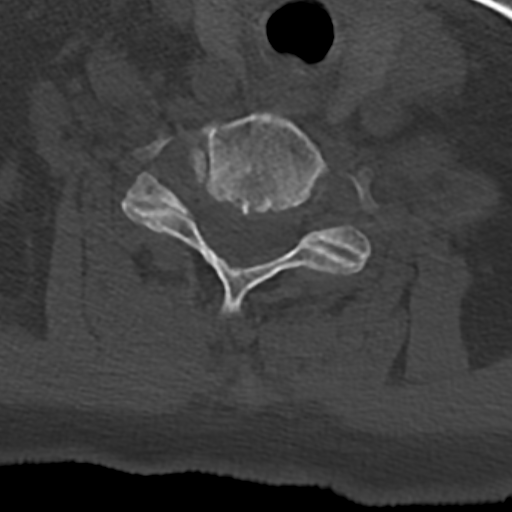
[im 55/83  bone]
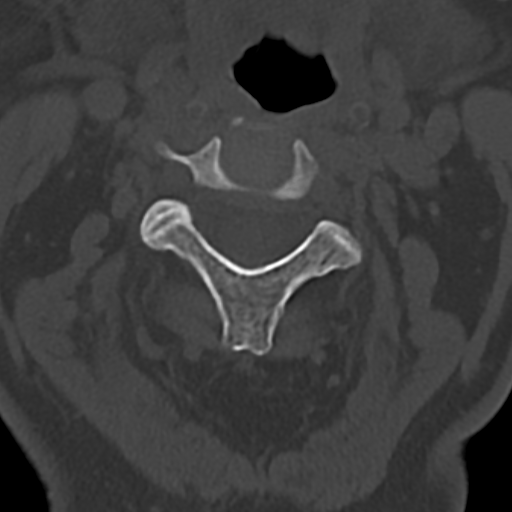

[14 of 33 positions shown; findings below may reference images not displayed]

FINDINGS: CT HEAD FINDINGS

Brain: Chronic infarct left cerebellum. No acute territorial
infarction, hemorrhage or intracranial mass. Atrophy. Minimal small
vessel ischemic change of the white matter. Stable ventricle size.

Vascular: No hyperdense vessels.  Carotid vascular calcification

Skull: Normal. Negative for fracture or focal lesion.

Sinuses/Orbits: No acute finding. Mild mucosal thickening in the
maxillary sinuses.

Other: None

CT CERVICAL SPINE FINDINGS

Alignment: No subluxation.  Facet alignment within normal limits

Skull base and vertebrae: No acute fracture. No primary bone lesion
or focal pathologic process.

Soft tissues and spinal canal: No prevertebral fluid or swelling. No
visible canal hematoma.

Disc levels: Moderate-to-marked degenerative change C5-C6, mild
degenerative changes C4-C5 and C6-C7.

Upper chest: Negative.

Other: None
IMPRESSION: 1. No CT evidence for acute intracranial abnormality. Chronic left
cerebellar infarct. Small vessel ischemic changes of the white
matter
2. Degenerative changes of the cervical spine. No acute osseous
abnormality.

## 2020-06-03 IMAGING — CR DG HUMERUS 2V *L*
2 series · 2 of 2 positions shown · non-contrast
Comparison: None.

CLINICAL DATA: Fall with pain

EXAM:
LEFT HUMERUS - 2+ VIEW

[humerus ap]
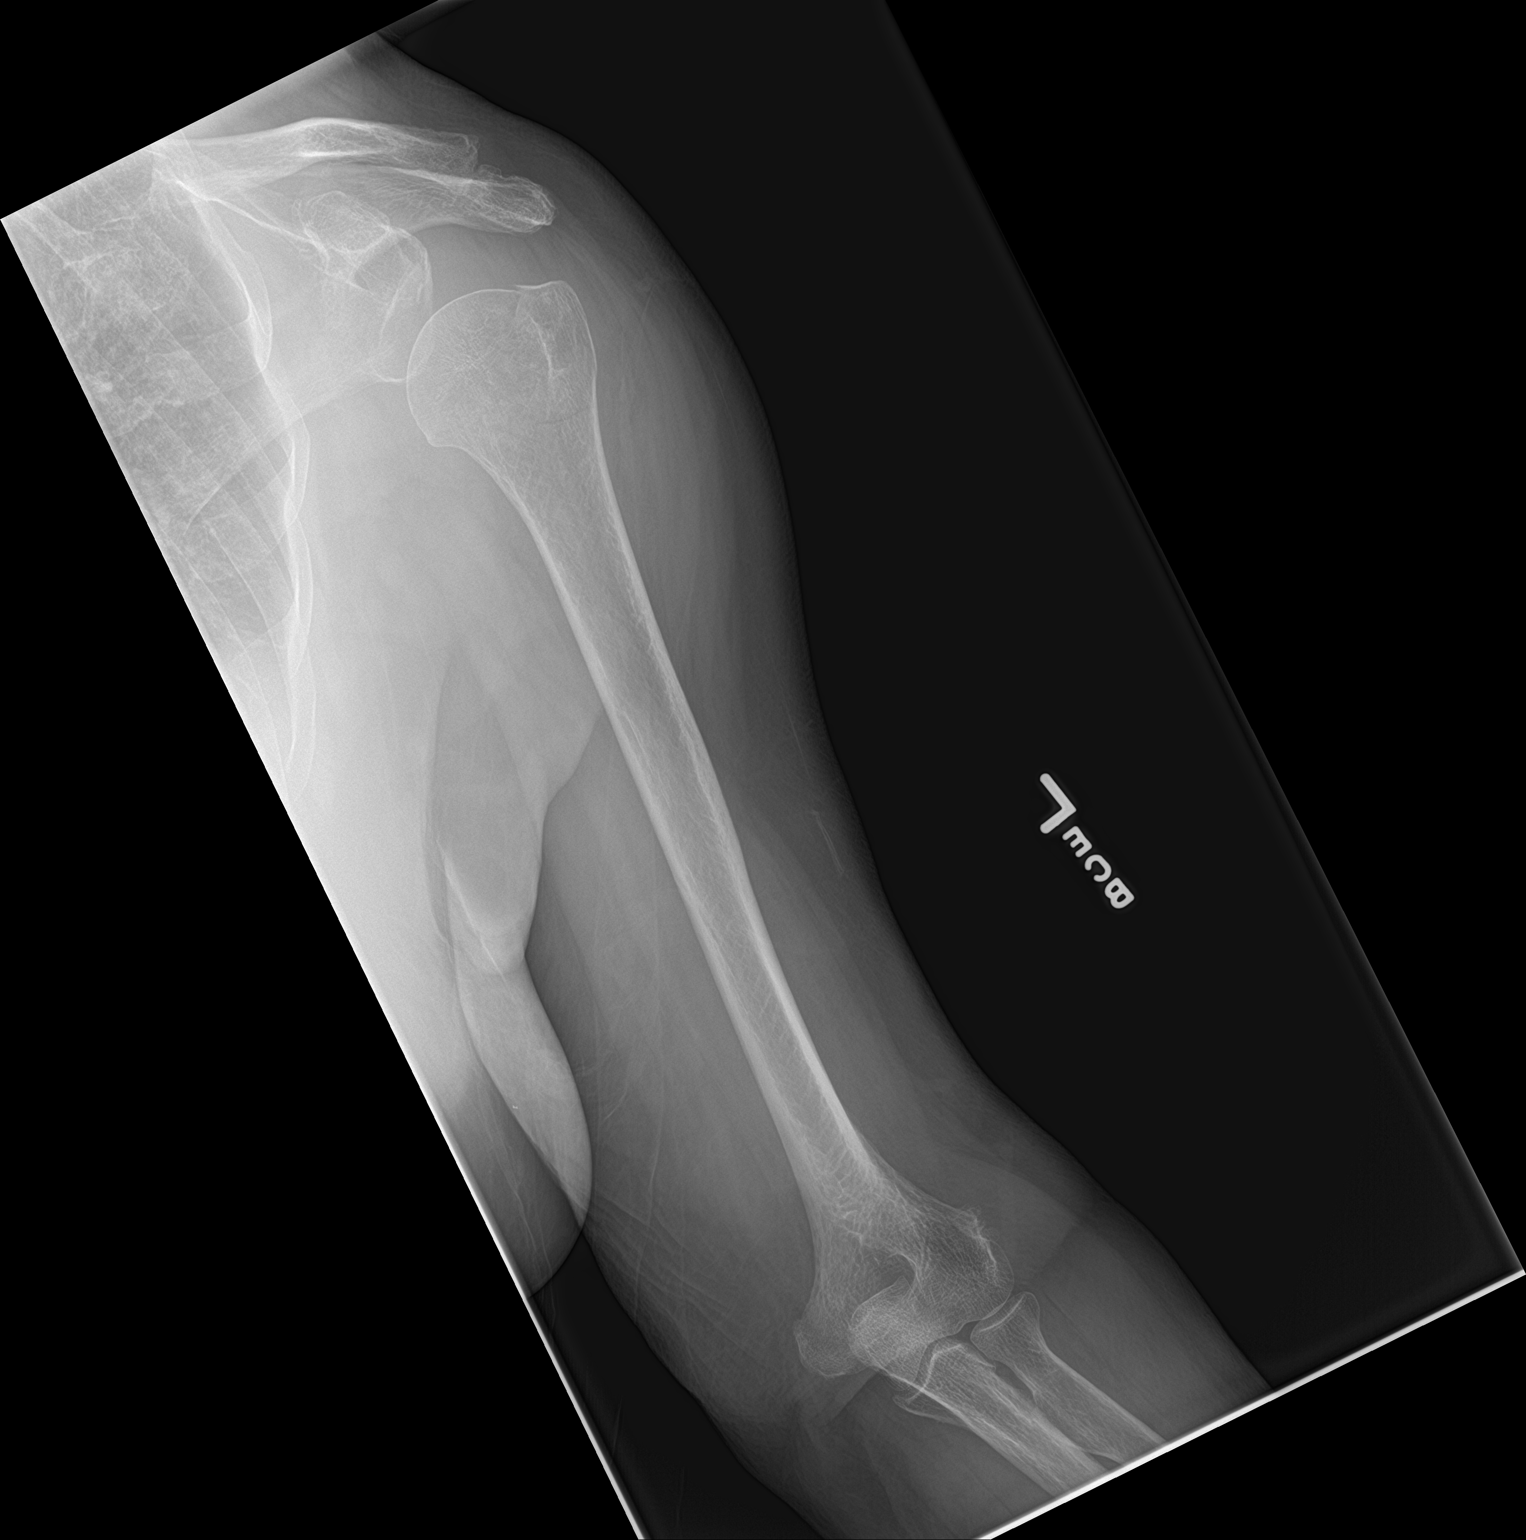

[humerus lat]
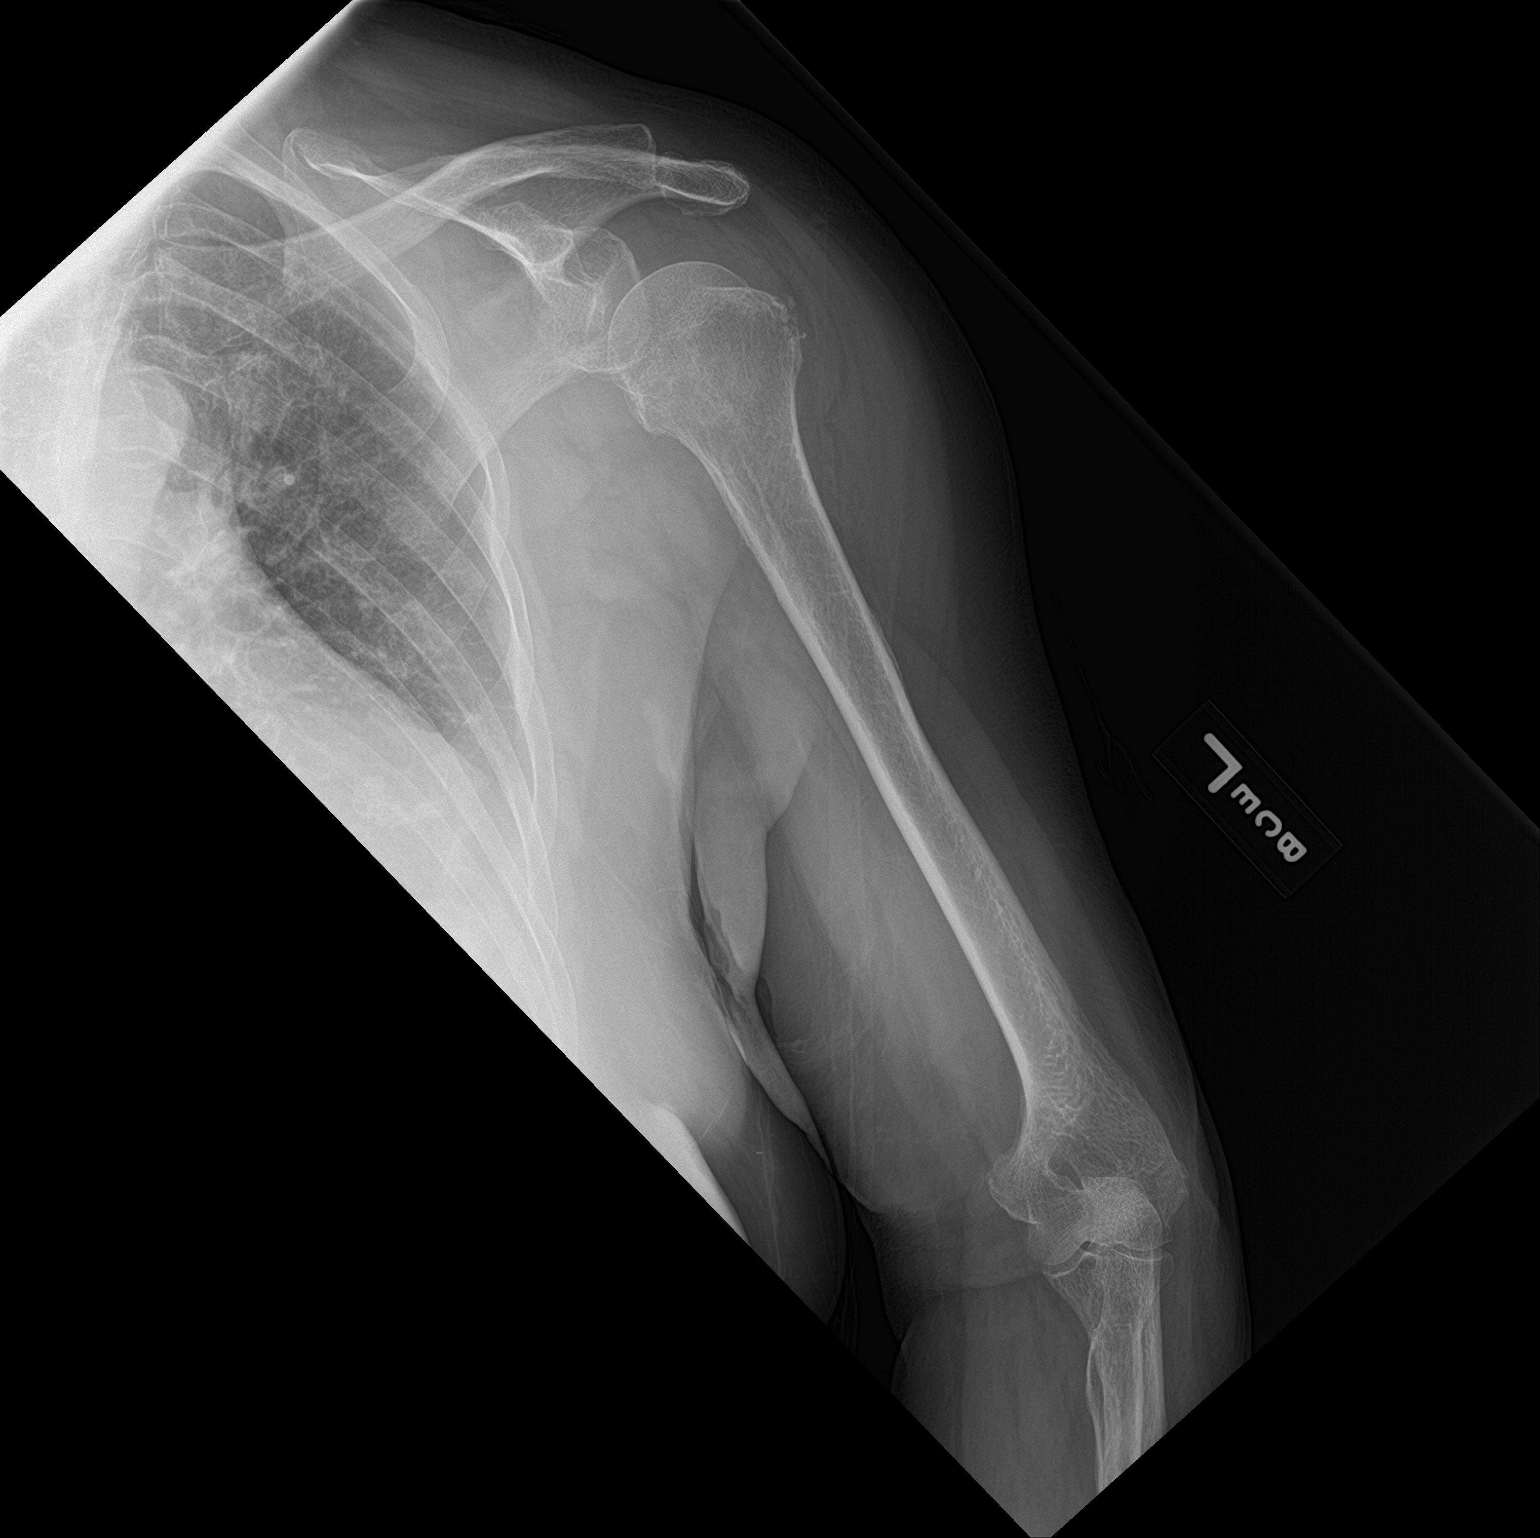

[2 of 2 positions shown; findings below may reference images not displayed]

FINDINGS: Acute nondisplaced fracture involving the greater tuberosity of the
proximal humerus with faint linear lucency at the left humeral neck.
Slight inferior positioning of humeral head could relate to
effusion.
IMPRESSION: Acute nondisplaced fracture involving the proximal humerus.

## 2020-06-20 ENCOUNTER — Ambulatory Visit (INDEPENDENT_AMBULATORY_CARE_PROVIDER_SITE_OTHER): Payer: PPO

## 2020-06-20 DIAGNOSIS — I639 Cerebral infarction, unspecified: Secondary | ICD-10-CM | POA: Diagnosis not present

## 2020-06-20 LAB — CUP PACEART REMOTE DEVICE CHECK
Date Time Interrogation Session: 20211020021334
Implantable Pulse Generator Implant Date: 20190305

## 2020-06-26 NOTE — Progress Notes (Signed)
Carelink Summary Report / Loop Recorder 

## 2020-07-23 ENCOUNTER — Ambulatory Visit (INDEPENDENT_AMBULATORY_CARE_PROVIDER_SITE_OTHER): Payer: PPO

## 2020-07-23 DIAGNOSIS — I639 Cerebral infarction, unspecified: Secondary | ICD-10-CM | POA: Diagnosis not present

## 2020-07-23 LAB — CUP PACEART REMOTE DEVICE CHECK
Date Time Interrogation Session: 20211120013222
Implantable Pulse Generator Implant Date: 20190305

## 2020-07-25 NOTE — Progress Notes (Signed)
Carelink Summary Report / Loop Recorder 

## 2020-08-06 DIAGNOSIS — N898 Other specified noninflammatory disorders of vagina: Secondary | ICD-10-CM | POA: Diagnosis not present

## 2020-08-06 DIAGNOSIS — E1165 Type 2 diabetes mellitus with hyperglycemia: Secondary | ICD-10-CM | POA: Diagnosis not present

## 2020-08-06 DIAGNOSIS — I10 Essential (primary) hypertension: Secondary | ICD-10-CM | POA: Diagnosis not present

## 2020-08-06 DIAGNOSIS — R300 Dysuria: Secondary | ICD-10-CM | POA: Diagnosis not present

## 2020-08-06 DIAGNOSIS — N390 Urinary tract infection, site not specified: Secondary | ICD-10-CM | POA: Diagnosis not present

## 2020-08-06 DIAGNOSIS — I779 Disorder of arteries and arterioles, unspecified: Secondary | ICD-10-CM | POA: Diagnosis not present

## 2020-08-10 ENCOUNTER — Other Ambulatory Visit: Payer: Self-pay | Admitting: Student

## 2020-08-21 LAB — CUP PACEART REMOTE DEVICE CHECK
Date Time Interrogation Session: 20211221013258
Implantable Pulse Generator Implant Date: 20190305

## 2020-08-27 ENCOUNTER — Ambulatory Visit (INDEPENDENT_AMBULATORY_CARE_PROVIDER_SITE_OTHER): Payer: PPO

## 2020-08-27 DIAGNOSIS — I639 Cerebral infarction, unspecified: Secondary | ICD-10-CM | POA: Diagnosis not present

## 2020-09-10 NOTE — Progress Notes (Signed)
Carelink Summary Report / Loop Recorder 

## 2020-09-11 DIAGNOSIS — Z0000 Encounter for general adult medical examination without abnormal findings: Secondary | ICD-10-CM | POA: Diagnosis not present

## 2020-09-12 DIAGNOSIS — M5136 Other intervertebral disc degeneration, lumbar region: Secondary | ICD-10-CM | POA: Diagnosis not present

## 2020-09-12 DIAGNOSIS — M5442 Lumbago with sciatica, left side: Secondary | ICD-10-CM | POA: Diagnosis not present

## 2020-09-12 DIAGNOSIS — M48061 Spinal stenosis, lumbar region without neurogenic claudication: Secondary | ICD-10-CM | POA: Diagnosis not present

## 2020-09-12 DIAGNOSIS — G8929 Other chronic pain: Secondary | ICD-10-CM | POA: Diagnosis not present

## 2020-10-01 ENCOUNTER — Ambulatory Visit (INDEPENDENT_AMBULATORY_CARE_PROVIDER_SITE_OTHER): Payer: PPO

## 2020-10-01 DIAGNOSIS — I639 Cerebral infarction, unspecified: Secondary | ICD-10-CM | POA: Diagnosis not present

## 2020-10-01 LAB — CUP PACEART REMOTE DEVICE CHECK
Date Time Interrogation Session: 20220129231126
Implantable Pulse Generator Implant Date: 20190305

## 2020-10-02 ENCOUNTER — Other Ambulatory Visit: Payer: Self-pay | Admitting: Student

## 2020-10-02 DIAGNOSIS — I10 Essential (primary) hypertension: Secondary | ICD-10-CM

## 2020-10-10 NOTE — Progress Notes (Signed)
Carelink Summary Report / Loop Recorder 

## 2020-10-17 NOTE — Progress Notes (Unsigned)
Electrophysiology Office Note Date: 10/18/2020  ID:  Cagney, Steenson [birth year], MRN [id number]  PCP: Jerl Mina, MD Primary Cardiologist: No primary care provider on file. Electrophysiologist: Lewayne Bunting, MD   CC: ILR follow-up  Yvonne Newton is a 80 y.o. female seen today for Dr. Ladona Ridgel . she presents today for routine electrophysiology followup.  Since last being seen in our clinic, the patient reports doing very well.  she denies chest pain, palpitations, dyspnea, PND, orthopnea, nausea, vomiting, dizziness, syncope, edema, weight gain, or early satiety.  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 Arthritis 07/22/2017  . Cerebellar stroke (HCC)   . Diabetes mellitus type 2 in nonobese (HCC)   . DM (diabetes mellitus) (HCC)   . Dysarthria   . Essential hypertension 07/22/2017  . Hyperlipidemia, unspecified 07/22/2017  . OA (osteoarthritis) of knee    right  . Obesity   . Stroke (HCC)   . Tachycardia 07/22/2017  . VENTRICULAR TACHYCARDIA 09/05/2009   Qualifier: Diagnosis of  By: Susette Racer CMA, Jewel    . Ventricular tachycardia (HCC)   . Vitamin B 12 deficiency    Past Surgical History:  Procedure Laterality Date  . BREAST BIOPSY Left 2010   Negative  . LOOP RECORDER INSERTION N/A 11/03/2017   Procedure: LOOP RECORDER INSERTION;  Surgeon: Regan Lemming, MD;  [facility]: [facility];  Service: Cardiovascular;  Laterality: N/A;  . TEE WITHOUT CARDIOVERSION N/A 11/03/2017   Procedure: TRANSESOPHAGEAL ECHOCARDIOGRAM (TEE);  Surgeon: Lars Masson, MD;  Location: [facility] ENDOSCOPY;  Service: Cardiovascular;  Laterality: N/A;    Current Outpatient Medications  Medication Sig Dispense Refill  . aspirin EC 81 MG EC tablet Take 1 tablet (81 mg total) by mouth daily.    . carvedilol (COREG) 12.5 MG tablet Take 1 tablet (12.5 mg total) by mouth 2 (two) times  daily. 180 tablet 3  . cetirizine (ZYRTEC) 10 MG tablet Take 10 mg by mouth daily.  2  . CYANOCOBALAMIN IJ Inject as directed every 30 (thirty) days. On or about the 26th of each month    . diltiazem (CARDIZEM CD) 120 MG 24 hr capsule Take 1 capsule (120 mg total) by mouth daily. 90 capsule 3  . EPINEPHrine 0.3 mg/0.3 mL IJ SOAJ injection Inject 0.3 mg into the muscle once as needed (severe allergic reaction).    . flecainide (TAMBOCOR) 50 MG tablet TAKE 1 TABLET (50 MG TOTAL) BY MOUTH 2 (TWO) TIMES DAILY. 60 tablet 2  . metFORMIN (GLUCOPHAGE-XR) 500 MG 24 hr tablet Take 500-1,000 mg by mouth See admin instructions. Take one tablet (500 mg) by mouth every morning and two tablets (1000 mg) at bedtime    . omeprazole (PRILOSEC) 20 MG capsule Take 20 mg by mouth daily.     . rosuvastatin (CRESTOR) 5 MG tablet Take 1 tablet (5 mg total) by mouth daily at 6 PM. 90 tablet 3  . Vitamin D, Ergocalciferol, (DRISDOL) 50000 units CAPS capsule Take 50,000 Units by mouth every 7 (seven) days.     No current facility-administered medications for this visit.    Allergies:   Blueberry flavor, Sulfa antibiotics, Sulfonamide derivatives, and Other   Social History: Social History   Socioeconomic History  . Marital status: Married    Spouse name: Not on file  . Number of children: Not on file  . Years of education: Not on file  .  Highest education level: Not on file  Occupational History  . Not on file  Tobacco Use  . Smoking status: Former Smoker    Quit date: 09/01/1998    Years since quitting: 22.1  . Smokeless tobacco: Never Used  Vaping Use  . Vaping Use: Never used  Substance and Sexual Activity  . Alcohol use: No  . Drug use: No  . Sexual activity: Not on file  Other Topics Concern  . Not on file  Social History Narrative  . Not on file   Social Determinants of Health   Financial Resource Strain: Not on file  Food Insecurity: Not on file  Transportation Needs: Not on file  Physical  Activity: Not on file  Stress: Not on file  Social Connections: Not on file  Intimate Partner Violence: Not on file    Family History: Family History  Problem Relation Age of Onset  . Heart attack Father 12  . Heart attack Brother 48  . Heart attack Brother        had CABG  . Breast cancer Neg Hx      Review of Systems: All other systems reviewed and are otherwise negative except as noted above.  Physical Exam: Vitals:   10/18/20 0801  BP: (!) 122/58  Pulse: 64  SpO2: 94%  Weight: 178 lb (80.7 kg)  Height: 5\' 3"  (1.6 m)     GEN- The patient is well appearing, alert and oriented x 3 today.   HEENT: normocephalic, atraumatic; sclera clear, conjunctiva pink; hearing intact; oropharynx clear; neck supple  Lungs- Clear to ausculation bilaterally, normal work of breathing.  No wheezes, rales, rhonchi Heart- Regular rate and rhythm, no murmurs, rubs or gallops  GI- soft, non-tender, non-distended, bowel sounds present  Extremities- no clubbing, cyanosis, or edema  MS- no significant deformity or atrophy Skin- warm and dry, no rash or lesion; PPM pocket well healed Psych- euthymic mood, full affect Neuro- strength and sensation are intact  PPM Interrogation- reviewed in detail today,  See PACEART report  EKG:  EKG is ordered today. The ekg ordered today shows NSR at 64 bpm with PR interval 204 ms (stable) and QRS 124 (stable)  Recent Labs: No results found for requested labs within last 8760 hours.   Wt Readings from Last 3 Encounters:  10/18/20 178 lb (80.7 kg)  10/18/19 184 lb (83.5 kg)  09/30/18 179 lb 12.8 oz (81.6 kg)     Other studies Reviewed: Additional studies/ records that were reviewed today include: Echo 2019 showed normal LVEF, Previous EP office notes, Previous remote checks, Most recent labwork.   Assessment and Plan:  1.  VT  Continue flecainide ILR with no tachy episodes.  ECG with stable PR and QRS interavls   2. Chronic venous  stasis Encouraged fluid and salt restriction She does have some SOB. Offered as needed lasix and she would prefer dietary restriction of salt and excess fluid.  If BNP markedly elevated consider Echo, if WNL consider PFTs vs referral to pulmonology.   3. Cryptogenic Stroke s/p Medtronic Loop recorder Normal device function See Pace Art report No changes today  4. HTN Continue current medications  Current medicines are reviewed at length with the patient today.   The patient does not have concerns regarding her medicines.  The following changes were made today:  none  Labs/ tests ordered today include:  Orders Placed This Encounter  Procedures  . Basic metabolic panel  . B Nat Peptide  . CUP  PACEART INCLINIC DEVICE CHECK  . EKG 12-Lead    Disposition:   Follow up with Dr. Ladona Ridgel in 6 Months on flecainide  Signed, Graciella Freer, PA-C  10/18/2020 8:36 AM  [facility] [address] [name] [address] [phone] (office) [phone] (fax)

## 2020-10-18 ENCOUNTER — Encounter: Payer: Self-pay | Admitting: Student

## 2020-10-18 ENCOUNTER — Other Ambulatory Visit: Payer: Self-pay

## 2020-10-18 ENCOUNTER — Ambulatory Visit: Payer: PPO | Admitting: Student

## 2020-10-18 VITALS — BP 122/58 | HR 64 | Ht 63.0 in | Wt 178.0 lb

## 2020-10-18 DIAGNOSIS — I639 Cerebral infarction, unspecified: Secondary | ICD-10-CM

## 2020-10-18 DIAGNOSIS — I4729 Other ventricular tachycardia: Secondary | ICD-10-CM

## 2020-10-18 DIAGNOSIS — I472 Ventricular tachycardia: Secondary | ICD-10-CM

## 2020-10-18 DIAGNOSIS — R000 Tachycardia, unspecified: Secondary | ICD-10-CM | POA: Diagnosis not present

## 2020-10-18 DIAGNOSIS — I10 Essential (primary) hypertension: Secondary | ICD-10-CM

## 2020-10-18 DIAGNOSIS — R609 Edema, unspecified: Secondary | ICD-10-CM

## 2020-10-18 LAB — CUP PACEART INCLINIC DEVICE CHECK
Date Time Interrogation Session: 20220217083027
Implantable Pulse Generator Implant Date: 20190305

## 2020-10-18 NOTE — Patient Instructions (Signed)
Medication Instructions:  Your physician recommends that you continue on your current medications as directed. Please refer to the Current Medication list given to you today.  *If you need a refill on your cardiac medications before your next appointment, please call your pharmacy*   Lab Work: TODAY: BMET, BNP If you have labs (blood work) drawn today and your tests are completely normal, you will receive your results only by: Marland Kitchen MyChart Message (if you have MyChart) OR . A paper copy in the mail If you have any lab test that is abnormal or we need to change your treatment, we will call you to review the results.   Follow-Up: At [facility], you and your health needs are our priority.  As part of our continuing mission to provide you with exceptional heart care, we have created designated Provider Care Teams.  These Care Teams include your primary Cardiologist (physician) and Advanced Practice Providers (APPs -  Physician Assistants and Nurse Practitioners) who all work together to provide you with the care you need, when you need it.  We recommend signing up for the patient portal called "MyChart".  Sign up information is provided on this After Visit Summary.  MyChart is used to connect with patients for Virtual Visits (Telemedicine).  Patients are able to view lab/test results, encounter notes, upcoming appointments, etc.  Non-urgent messages can be sent to your provider as well.   To learn more about what you can do with MyChart, go to [email or web address].    Your next appointment:   6 month(s)  The format for your next appointment:   In Person  Provider:   You may see Lewayne Bunting, MD or one of the following Advanced Practice Providers on your designated Care Team:     Casimiro Needle "Mardelle Matte" [place], [place]

## 2020-10-19 LAB — BASIC METABOLIC PANEL
BUN/Creatinine Ratio: 23 (ref 12–28)
BUN: 16 mg/dL (ref 8–27)
CO2: 21 mmol/L (ref 20–29)
Calcium: 9.4 mg/dL (ref 8.7–10.3)
Chloride: 101 mmol/L (ref 96–106)
Creatinine, Ser: 0.71 mg/dL (ref 0.57–1.00)
GFR calc Af Amer: 94 mL/min/{1.73_m2} (ref >59)
GFR calc non Af Amer: 81 mL/min/{1.73_m2} (ref >59)
Glucose: 192 mg/dL — ABNORMAL HIGH (ref 65–99)
Potassium: 4.3 mmol/L (ref 3.5–5.2)
Sodium: 141 mmol/L (ref 134–144)

## 2020-10-19 LAB — PRO B NATRIURETIC PEPTIDE: NT-Pro BNP: 119 pg/mL (ref 0–738)

## 2020-10-22 ENCOUNTER — Other Ambulatory Visit: Payer: Self-pay | Admitting: Student

## 2020-10-22 DIAGNOSIS — I10 Essential (primary) hypertension: Secondary | ICD-10-CM

## 2020-10-23 ENCOUNTER — Telehealth: Payer: Self-pay | Admitting: Student

## 2020-10-23 DIAGNOSIS — R0600 Dyspnea, unspecified: Secondary | ICD-10-CM

## 2020-10-23 DIAGNOSIS — R0609 Other forms of dyspnea: Secondary | ICD-10-CM

## 2020-10-23 DIAGNOSIS — Z87891 Personal history of nicotine dependence: Secondary | ICD-10-CM

## 2020-10-23 NOTE — Telephone Encounter (Signed)
Per Otilio Saber PA, patient's lab work was good, but wanted to refer her to pulmonology for SOB and history of smoking. Will place referral. Called patient with results. Patient verbalized understanding.

## 2020-10-23 NOTE — Telephone Encounter (Signed)
Follow Up:      Pt was returning a call from today, she said it was a man, but did not know who called.

## 2020-11-05 ENCOUNTER — Ambulatory Visit (INDEPENDENT_AMBULATORY_CARE_PROVIDER_SITE_OTHER): Payer: PPO

## 2020-11-05 DIAGNOSIS — I639 Cerebral infarction, unspecified: Secondary | ICD-10-CM | POA: Diagnosis not present

## 2020-11-07 LAB — CUP PACEART REMOTE DEVICE CHECK
Date Time Interrogation Session: 20220301231753
Implantable Pulse Generator Implant Date: 20190305

## 2020-11-14 NOTE — Progress Notes (Signed)
Carelink Summary Report / Loop Recorder 

## 2020-12-08 LAB — CUP PACEART REMOTE DEVICE CHECK
Date Time Interrogation Session: 20220402001938
Implantable Pulse Generator Implant Date: 20190305

## 2020-12-10 ENCOUNTER — Ambulatory Visit (INDEPENDENT_AMBULATORY_CARE_PROVIDER_SITE_OTHER): Payer: PPO

## 2020-12-10 DIAGNOSIS — I639 Cerebral infarction, unspecified: Secondary | ICD-10-CM | POA: Diagnosis not present

## 2020-12-25 NOTE — Progress Notes (Signed)
Carelink Summary Report / Loop Recorder 

## 2020-12-26 DIAGNOSIS — E559 Vitamin D deficiency, unspecified: Secondary | ICD-10-CM | POA: Diagnosis not present

## 2020-12-26 DIAGNOSIS — R413 Other amnesia: Secondary | ICD-10-CM | POA: Diagnosis not present

## 2020-12-26 DIAGNOSIS — G8929 Other chronic pain: Secondary | ICD-10-CM | POA: Diagnosis not present

## 2020-12-26 DIAGNOSIS — M5440 Lumbago with sciatica, unspecified side: Secondary | ICD-10-CM | POA: Diagnosis not present

## 2020-12-26 DIAGNOSIS — Z0000 Encounter for general adult medical examination without abnormal findings: Secondary | ICD-10-CM | POA: Diagnosis not present

## 2020-12-26 DIAGNOSIS — I679 Cerebrovascular disease, unspecified: Secondary | ICD-10-CM | POA: Diagnosis not present

## 2020-12-26 DIAGNOSIS — M810 Age-related osteoporosis without current pathological fracture: Secondary | ICD-10-CM | POA: Diagnosis not present

## 2020-12-26 DIAGNOSIS — E785 Hyperlipidemia, unspecified: Secondary | ICD-10-CM | POA: Diagnosis not present

## 2020-12-26 DIAGNOSIS — Z8679 Personal history of other diseases of the circulatory system: Secondary | ICD-10-CM | POA: Diagnosis not present

## 2020-12-26 DIAGNOSIS — I10 Essential (primary) hypertension: Secondary | ICD-10-CM | POA: Diagnosis not present

## 2020-12-26 DIAGNOSIS — E1165 Type 2 diabetes mellitus with hyperglycemia: Secondary | ICD-10-CM | POA: Diagnosis not present

## 2020-12-27 ENCOUNTER — Other Ambulatory Visit: Payer: Self-pay | Admitting: Family Medicine

## 2020-12-27 DIAGNOSIS — Z8679 Personal history of other diseases of the circulatory system: Secondary | ICD-10-CM

## 2020-12-28 ENCOUNTER — Other Ambulatory Visit: Payer: Self-pay | Admitting: Family Medicine

## 2020-12-28 DIAGNOSIS — E538 Deficiency of other specified B group vitamins: Secondary | ICD-10-CM | POA: Diagnosis not present

## 2020-12-28 DIAGNOSIS — Z1231 Encounter for screening mammogram for malignant neoplasm of breast: Secondary | ICD-10-CM

## 2021-01-02 DIAGNOSIS — G8929 Other chronic pain: Secondary | ICD-10-CM | POA: Diagnosis not present

## 2021-01-02 DIAGNOSIS — M5442 Lumbago with sciatica, left side: Secondary | ICD-10-CM | POA: Diagnosis not present

## 2021-01-02 DIAGNOSIS — M48061 Spinal stenosis, lumbar region without neurogenic claudication: Secondary | ICD-10-CM | POA: Diagnosis not present

## 2021-01-02 DIAGNOSIS — M5441 Lumbago with sciatica, right side: Secondary | ICD-10-CM | POA: Diagnosis not present

## 2021-01-07 ENCOUNTER — Ambulatory Visit
Admission: RE | Admit: 2021-01-07 | Discharge: 2021-01-07 | Disposition: A | Discharge status: Home / Self Care | Payer: PPO | Source: Ambulatory Visit | Attending: Family Medicine | Admitting: Family Medicine

## 2021-01-07 ENCOUNTER — Other Ambulatory Visit: Payer: Self-pay

## 2021-01-07 DIAGNOSIS — Z1231 Encounter for screening mammogram for malignant neoplasm of breast: Secondary | ICD-10-CM | POA: Insufficient documentation

## 2021-01-10 ENCOUNTER — Institutional Professional Consult (permissible substitution): Payer: PPO | Admitting: Pulmonary Disease

## 2021-01-10 DIAGNOSIS — M48061 Spinal stenosis, lumbar region without neurogenic claudication: Secondary | ICD-10-CM | POA: Diagnosis not present

## 2021-01-10 DIAGNOSIS — M5417 Radiculopathy, lumbosacral region: Secondary | ICD-10-CM | POA: Diagnosis not present

## 2021-01-10 DIAGNOSIS — G8929 Other chronic pain: Secondary | ICD-10-CM | POA: Diagnosis not present

## 2021-01-10 DIAGNOSIS — M5442 Lumbago with sciatica, left side: Secondary | ICD-10-CM | POA: Diagnosis not present

## 2021-01-10 DIAGNOSIS — E119 Type 2 diabetes mellitus without complications: Secondary | ICD-10-CM | POA: Diagnosis not present

## 2021-01-10 DIAGNOSIS — M5441 Lumbago with sciatica, right side: Secondary | ICD-10-CM | POA: Diagnosis not present

## 2021-01-11 DIAGNOSIS — E538 Deficiency of other specified B group vitamins: Secondary | ICD-10-CM | POA: Diagnosis not present

## 2021-01-14 ENCOUNTER — Ambulatory Visit (INDEPENDENT_AMBULATORY_CARE_PROVIDER_SITE_OTHER): Payer: PPO

## 2021-01-14 DIAGNOSIS — I639 Cerebral infarction, unspecified: Secondary | ICD-10-CM | POA: Diagnosis not present

## 2021-01-15 LAB — CUP PACEART REMOTE DEVICE CHECK
Date Time Interrogation Session: 20220514231105
Implantable Pulse Generator Implant Date: 20190305

## 2021-01-18 DIAGNOSIS — E538 Deficiency of other specified B group vitamins: Secondary | ICD-10-CM | POA: Diagnosis not present

## 2021-02-06 NOTE — Progress Notes (Signed)
Carelink Summary Report / Loop Recorder 

## 2021-02-09 ENCOUNTER — Other Ambulatory Visit: Payer: Self-pay | Admitting: Student

## 2021-02-09 DIAGNOSIS — I10 Essential (primary) hypertension: Secondary | ICD-10-CM

## 2021-02-13 ENCOUNTER — Other Ambulatory Visit: Payer: Self-pay | Admitting: Student

## 2021-02-18 ENCOUNTER — Ambulatory Visit (INDEPENDENT_AMBULATORY_CARE_PROVIDER_SITE_OTHER): Payer: PPO

## 2021-02-18 DIAGNOSIS — I639 Cerebral infarction, unspecified: Secondary | ICD-10-CM | POA: Diagnosis not present

## 2021-02-18 LAB — CUP PACEART REMOTE DEVICE CHECK
Date Time Interrogation Session: 20220614232850
Implantable Pulse Generator Implant Date: 20190305

## 2021-02-22 DIAGNOSIS — E538 Deficiency of other specified B group vitamins: Secondary | ICD-10-CM | POA: Diagnosis not present

## 2021-03-11 NOTE — Progress Notes (Signed)
Carelink Summary Report / Loop Recorder 

## 2021-03-19 DIAGNOSIS — E1169 Type 2 diabetes mellitus with other specified complication: Secondary | ICD-10-CM | POA: Diagnosis not present

## 2021-03-19 DIAGNOSIS — M810 Age-related osteoporosis without current pathological fracture: Secondary | ICD-10-CM | POA: Diagnosis not present

## 2021-03-19 DIAGNOSIS — I152 Hypertension secondary to endocrine disorders: Secondary | ICD-10-CM | POA: Diagnosis not present

## 2021-03-19 DIAGNOSIS — E1159 Type 2 diabetes mellitus with other circulatory complications: Secondary | ICD-10-CM | POA: Diagnosis not present

## 2021-03-19 DIAGNOSIS — E1165 Type 2 diabetes mellitus with hyperglycemia: Secondary | ICD-10-CM | POA: Diagnosis not present

## 2021-03-19 DIAGNOSIS — E785 Hyperlipidemia, unspecified: Secondary | ICD-10-CM | POA: Diagnosis not present

## 2021-03-21 LAB — CUP PACEART REMOTE DEVICE CHECK
Date Time Interrogation Session: 20220716002655
Implantable Pulse Generator Implant Date: 20190305

## 2021-03-25 ENCOUNTER — Ambulatory Visit (INDEPENDENT_AMBULATORY_CARE_PROVIDER_SITE_OTHER): Payer: PPO

## 2021-03-25 DIAGNOSIS — I639 Cerebral infarction, unspecified: Secondary | ICD-10-CM

## 2021-03-27 DIAGNOSIS — E1165 Type 2 diabetes mellitus with hyperglycemia: Secondary | ICD-10-CM | POA: Diagnosis not present

## 2021-03-27 DIAGNOSIS — M810 Age-related osteoporosis without current pathological fracture: Secondary | ICD-10-CM | POA: Diagnosis not present

## 2021-03-27 DIAGNOSIS — E559 Vitamin D deficiency, unspecified: Secondary | ICD-10-CM | POA: Diagnosis not present

## 2021-04-16 ENCOUNTER — Ambulatory Visit (INDEPENDENT_AMBULATORY_CARE_PROVIDER_SITE_OTHER): Payer: PPO

## 2021-04-16 DIAGNOSIS — I639 Cerebral infarction, unspecified: Secondary | ICD-10-CM

## 2021-04-16 LAB — CUP PACEART REMOTE DEVICE CHECK
Date Time Interrogation Session: 20220816011451
Implantable Pulse Generator Implant Date: 20190305

## 2021-04-19 NOTE — Progress Notes (Signed)
Carelink Summary Report / Loop Recorder 

## 2021-05-07 NOTE — Progress Notes (Signed)
Carelink Summary Report / Loop Recorder 

## 2021-05-20 ENCOUNTER — Ambulatory Visit (INDEPENDENT_AMBULATORY_CARE_PROVIDER_SITE_OTHER): Payer: PPO

## 2021-05-20 DIAGNOSIS — I639 Cerebral infarction, unspecified: Secondary | ICD-10-CM

## 2021-05-22 LAB — CUP PACEART REMOTE DEVICE CHECK
Date Time Interrogation Session: 20220916011904
Implantable Pulse Generator Implant Date: 20190305

## 2021-05-27 NOTE — Progress Notes (Signed)
Carelink Summary Report / Loop Recorder 

## 2021-06-24 ENCOUNTER — Ambulatory Visit (INDEPENDENT_AMBULATORY_CARE_PROVIDER_SITE_OTHER): Payer: PPO

## 2021-06-24 DIAGNOSIS — I639 Cerebral infarction, unspecified: Secondary | ICD-10-CM

## 2021-06-24 LAB — CUP PACEART REMOTE DEVICE CHECK
Date Time Interrogation Session: 20221017012235
Implantable Pulse Generator Implant Date: 20190305

## 2021-06-27 DIAGNOSIS — I739 Peripheral vascular disease, unspecified: Secondary | ICD-10-CM | POA: Diagnosis not present

## 2021-06-27 DIAGNOSIS — E119 Type 2 diabetes mellitus without complications: Secondary | ICD-10-CM | POA: Diagnosis not present

## 2021-06-27 DIAGNOSIS — E559 Vitamin D deficiency, unspecified: Secondary | ICD-10-CM | POA: Diagnosis not present

## 2021-06-27 DIAGNOSIS — M810 Age-related osteoporosis without current pathological fracture: Secondary | ICD-10-CM | POA: Diagnosis not present

## 2021-06-27 DIAGNOSIS — R413 Other amnesia: Secondary | ICD-10-CM | POA: Diagnosis not present

## 2021-06-27 DIAGNOSIS — E1165 Type 2 diabetes mellitus with hyperglycemia: Secondary | ICD-10-CM | POA: Diagnosis not present

## 2021-06-27 DIAGNOSIS — E1159 Type 2 diabetes mellitus with other circulatory complications: Secondary | ICD-10-CM | POA: Diagnosis not present

## 2021-06-27 DIAGNOSIS — E538 Deficiency of other specified B group vitamins: Secondary | ICD-10-CM | POA: Diagnosis not present

## 2021-06-27 DIAGNOSIS — R519 Headache, unspecified: Secondary | ICD-10-CM | POA: Diagnosis not present

## 2021-06-27 DIAGNOSIS — I152 Hypertension secondary to endocrine disorders: Secondary | ICD-10-CM | POA: Diagnosis not present

## 2021-06-27 DIAGNOSIS — I10 Essential (primary) hypertension: Secondary | ICD-10-CM | POA: Diagnosis not present

## 2021-06-27 DIAGNOSIS — E785 Hyperlipidemia, unspecified: Secondary | ICD-10-CM | POA: Diagnosis not present

## 2021-06-27 DIAGNOSIS — E1169 Type 2 diabetes mellitus with other specified complication: Secondary | ICD-10-CM | POA: Diagnosis not present

## 2021-07-02 NOTE — Progress Notes (Signed)
Carelink Summary Report / Loop Recorder 

## 2021-07-05 ENCOUNTER — Other Ambulatory Visit: Payer: Self-pay

## 2021-07-05 ENCOUNTER — Ambulatory Visit
Admission: RE | Admit: 2021-07-05 | Discharge: 2021-07-05 | Disposition: A | Discharge status: Home / Self Care | Payer: PPO | Source: Ambulatory Visit | Attending: Family Medicine | Admitting: Family Medicine

## 2021-07-05 DIAGNOSIS — Z8679 Personal history of other diseases of the circulatory system: Secondary | ICD-10-CM | POA: Diagnosis not present

## 2021-07-05 DIAGNOSIS — I6523 Occlusion and stenosis of bilateral carotid arteries: Secondary | ICD-10-CM | POA: Diagnosis not present

## 2021-07-19 DIAGNOSIS — M810 Age-related osteoporosis without current pathological fracture: Secondary | ICD-10-CM | POA: Diagnosis not present

## 2021-07-29 ENCOUNTER — Ambulatory Visit (INDEPENDENT_AMBULATORY_CARE_PROVIDER_SITE_OTHER): Payer: PPO

## 2021-07-29 DIAGNOSIS — I639 Cerebral infarction, unspecified: Secondary | ICD-10-CM

## 2021-07-29 LAB — CUP PACEART REMOTE DEVICE CHECK
Date Time Interrogation Session: 20221128134029
Implantable Pulse Generator Implant Date: 20190305

## 2021-08-07 NOTE — Progress Notes (Signed)
Carelink Summary Report / Loop Recorder 

## 2021-08-09 DIAGNOSIS — Z9841 Cataract extraction status, right eye: Secondary | ICD-10-CM | POA: Diagnosis not present

## 2021-08-09 DIAGNOSIS — Z9842 Cataract extraction status, left eye: Secondary | ICD-10-CM | POA: Diagnosis not present

## 2021-08-09 DIAGNOSIS — H52223 Regular astigmatism, bilateral: Secondary | ICD-10-CM | POA: Diagnosis not present

## 2021-08-09 DIAGNOSIS — E113293 Type 2 diabetes mellitus with mild nonproliferative diabetic retinopathy without macular edema, bilateral: Secondary | ICD-10-CM | POA: Diagnosis not present

## 2021-08-19 ENCOUNTER — Ambulatory Visit: Payer: PPO

## 2021-08-20 DIAGNOSIS — R0981 Nasal congestion: Secondary | ICD-10-CM | POA: Diagnosis not present

## 2021-08-20 DIAGNOSIS — E1165 Type 2 diabetes mellitus with hyperglycemia: Secondary | ICD-10-CM | POA: Diagnosis not present

## 2021-08-20 DIAGNOSIS — Z7984 Long term (current) use of oral hypoglycemic drugs: Secondary | ICD-10-CM | POA: Diagnosis not present

## 2021-08-20 DIAGNOSIS — R059 Cough, unspecified: Secondary | ICD-10-CM | POA: Diagnosis not present

## 2021-08-20 LAB — CUP PACEART REMOTE DEVICE CHECK
Date Time Interrogation Session: 20221218003044
Implantable Pulse Generator Implant Date: 20190305

## 2021-08-22 ENCOUNTER — Telehealth: Payer: Self-pay

## 2021-08-22 NOTE — Telephone Encounter (Signed)
Spoke with patient regarding her LINQ reaching RRT, patient voiced understanding patient stated that she would like to have the [facility] removed informed her that I would send a msg to out scheduler and she would reach out with an appointment. Informed patient that I would be sending out a return kit for her home monitor patient voiced understanding

## 2021-10-17 ENCOUNTER — Ambulatory Visit: Payer: PPO | Admitting: Internal Medicine

## 2021-10-17 ENCOUNTER — Encounter: Payer: Self-pay | Admitting: Internal Medicine

## 2021-10-17 ENCOUNTER — Other Ambulatory Visit: Payer: Self-pay

## 2021-10-17 VITALS — BP 160/66 | HR 64 | Ht 60.0 in | Wt 171.0 lb

## 2021-10-17 DIAGNOSIS — I639 Cerebral infarction, unspecified: Secondary | ICD-10-CM

## 2021-10-17 DIAGNOSIS — I4729 Other ventricular tachycardia: Secondary | ICD-10-CM

## 2021-10-17 NOTE — Progress Notes (Signed)
HPI Mrs. Shimon returns today for ongoing evaluation of near syncope and to have her ILR removed. She has not had additional symptoms. Her ILR has reached RRT. She would like to have it removed. She has otherwise done well.  Allergies  Allergen Reactions   Blueberry Flavor Hives and Shortness Of Breath   Sulfa Antibiotics Itching   Sulfonamide Derivatives Itching   Other Swelling and Rash    METAL     Current Outpatient Medications  Medication Sig Dispense Refill   aspirin EC 81 MG EC tablet Take 1 tablet (81 mg total) by mouth daily.     carvedilol (COREG) 12.5 MG tablet TAKE 1 TABLET BY MOUTH 2 TIMES DAILY. 180 tablet 3   cetirizine (ZYRTEC) 10 MG tablet Take 10 mg by mouth daily.  2   CYANOCOBALAMIN IJ Inject as directed every 30 (thirty) days. On or about the 26th of each month     diltiazem (CARDIZEM CD) 120 MG 24 hr capsule TAKE 1 CAPSULE BY MOUTH EVERY DAY 90 capsule 2   EPINEPHrine 0.3 mg/0.3 mL IJ SOAJ injection Inject 0.3 mg into the muscle once as needed (severe allergic reaction).     flecainide (TAMBOCOR) 50 MG tablet TAKE 1 TABLET (50 MG TOTAL) BY MOUTH 2 (TWO) TIMES DAILY. 60 tablet 2   metFORMIN (GLUCOPHAGE-XR) 500 MG 24 hr tablet Take 500-1,000 mg by mouth See admin instructions. Take one tablet (500 mg) by mouth every morning and two tablets (1000 mg) at bedtime     omeprazole (PRILOSEC) 20 MG capsule Take 20 mg by mouth daily.      rosuvastatin (CRESTOR) 5 MG tablet Take 1 tablet (5 mg total) by mouth daily at 6 PM. 90 tablet 3   Vitamin D, Ergocalciferol, (DRISDOL) 50000 units CAPS capsule Take 50,000 Units by mouth every 7 (seven) days.     No current facility-administered medications for this visit.     Past Medical History:  Diagnosis Date   Arthritis 07/22/2017   Cerebellar stroke (HCC)    Diabetes mellitus type 2 in nonobese (HCC)    DM (diabetes mellitus) (HCC)    Dysarthria    Essential hypertension 07/22/2017   Hyperlipidemia, unspecified  07/22/2017   OA (osteoarthritis) of knee    right   Obesity    Stroke [facility])    Tachycardia 07/22/2017   Ventricular tachycardia    VENTRICULAR TACHYCARDIA 09/05/2009   Qualifier: Diagnosis of  By: Susette Racer CMA, Jewel     Vitamin B 12 deficiency     ROS:   All systems reviewed and negative except as noted in the HPI.   Past Surgical History:  Procedure Laterality Date   BREAST BIOPSY Left 2010   Negative   LOOP RECORDER INSERTION N/A 11/03/2017   Procedure: LOOP RECORDER INSERTION;  Surgeon: Regan Lemming, MD;  Location: [facility] INVASIVE CV LAB;  Service: Cardiovascular;  Laterality: N/A;   TEE WITHOUT CARDIOVERSION N/A 11/03/2017   Procedure: TRANSESOPHAGEAL ECHOCARDIOGRAM (TEE);  Surgeon: Lars Masson, MD;  Location: [facility] ENDOSCOPY;  Service: Cardiovascular;  Laterality: N/A;     Family History  Problem Relation Age of Onset   Heart attack Father 80   Heart attack Brother 39   Heart attack Brother        had CABG   Breast cancer Neg Hx      Social History   Socioeconomic History   Marital status: Married    Spouse name: Not on file  Number of children: Not on file   Years of education: Not on file   Highest education level: Not on file  Occupational History   Not on file  Tobacco Use   Smoking status: Former    Types: Cigarettes    Quit date: 09/01/1998    Years since quitting: 23.1   Smokeless tobacco: Never  Vaping Use   Vaping Use: Never used  Substance and Sexual Activity   Alcohol use: No   Drug use: No   Sexual activity: Not on file  Other Topics Concern   Not on file  Social History Narrative   Not on file   Social Determinants of Health   Financial Resource Strain: Not on file  Food Insecurity: Not on file  Transportation Needs: Not on file  Physical Activity: Not on file  Stress: Not on file  Social Connections: Not on file  Intimate Partner Violence: Not on file     BP (!) 160/66    Pulse 64    Ht 5' (1.524 m)    Wt 171 lb (77.6 kg)     SpO2 96%    BMI 33.40 kg/m   Physical Exam:  Well appearing NAD HEENT: Unremarkable Neck:  No JVD, no thyromegally Lymphatics:  No adenopathy Back:  No CVA tenderness Lungs:  Clear with no wheezes HEART:  Regular rate rhythm, no murmurs, no rubs, no clicks Abd:  soft, positive bowel sounds, no organomegally, no rebound, no guarding Ext:  2 plus pulses, no edema, no cyanosis, no clubbing Skin:  No rashes no nodules Neuro:  CN II through XII intact, motor grossly intact  DEVICE  Normal device function.  See PaceArt for details.   Assess/Plan:  Syncope - she is asymptomatic. We will plan to remove her ILR which is at end of service. VT - she has been asymptomatic.  Peripheral edema - resolved. No change in her meds.  EP procedure Note  Procedure Performed: ILR removal  Preoperative diagnosis: VT and near syncope s/p ILR insertion with the device at RRT.  Postoperative diagnosis: same as preop.  Description of the procedure: after informed consent was obtained, the patient was prepped an draped in a sterile fashion. 8 cc of lidocaine was infiltrated. A one cm stab incision was carried out. A combination of blunt and sharp dissection was utilized to dissect down to the ILR which was grasped. The scar tissue around the ILR was removed. The device was removed with gentle traction.  Complications: none immediately  Conclusion: successful ILR removal without compications.   Sharlot Gowda Johnatan Baskette,MD

## 2021-10-17 NOTE — Patient Instructions (Addendum)
Medication Instructions:  Your physician recommends that you continue on your current medications as directed. Please refer to the Current Medication list given to you today.  Labwork: None ordered.  Testing/Procedures: None ordered.  Follow-Up: Your physician wants you to follow-up in: one year with Dr. Ladona Ridgel.  You will receive a reminder letter in the mail two months in advance. If you don't receive a letter, please call our office to schedule the follow-up appointment.  Implantable Loop Recorder Removal, Care After This sheet gives you information about how to care for yourself after your procedure. Your health care provider may also give you more specific instructions. If you have problems or questions, contact your health care provider. What can I expect after the procedure? After the procedure, it is common to have: Soreness or discomfort near the incision. Some swelling or bruising near the incision.  Follow these instructions at home: Incision care   Leave your outer dressing on for 72 hours.  After 72 hours you can remove your outer dressing and shower. Leave adhesive strips in place. These skin closures may need to stay in place for 1-2 weeks. If adhesive strip edges start to loosen and curl up, you may trim the loose edges.  You may remove the strips if they have not fallen off after 2 weeks. Check your incision area every day for signs of infection. Check for: Redness, swelling, or pain. Fluid or blood. Warmth. Pus or a bad smell. Do not take baths, swim, or use a hot tub until your incision is completely healed. If your wound site starts to bleed apply pressure.      If you have any questions/concerns please call the device clinic at [phone].  Activity  Return to your normal activities.  Contact a health care provider if: You have redness, swelling, or pain around your incision. You have a fever.

## 2021-10-24 DIAGNOSIS — E1151 Type 2 diabetes mellitus with diabetic peripheral angiopathy without gangrene: Secondary | ICD-10-CM | POA: Diagnosis not present

## 2021-10-24 DIAGNOSIS — I472 Ventricular tachycardia, unspecified: Secondary | ICD-10-CM | POA: Diagnosis not present

## 2021-10-24 DIAGNOSIS — F331 Major depressive disorder, recurrent, moderate: Secondary | ICD-10-CM | POA: Diagnosis not present

## 2021-10-24 DIAGNOSIS — R419 Unspecified symptoms and signs involving cognitive functions and awareness: Secondary | ICD-10-CM | POA: Diagnosis not present

## 2021-10-24 DIAGNOSIS — G8191 Hemiplegia, unspecified affecting right dominant side: Secondary | ICD-10-CM | POA: Diagnosis not present

## 2021-10-24 DIAGNOSIS — Z7984 Long term (current) use of oral hypoglycemic drugs: Secondary | ICD-10-CM | POA: Diagnosis not present

## 2021-10-24 DIAGNOSIS — I10 Essential (primary) hypertension: Secondary | ICD-10-CM | POA: Diagnosis not present

## 2021-10-24 DIAGNOSIS — D692 Other nonthrombocytopenic purpura: Secondary | ICD-10-CM | POA: Diagnosis not present

## 2021-11-16 ENCOUNTER — Other Ambulatory Visit: Payer: Self-pay | Admitting: Student

## 2021-11-28 DIAGNOSIS — M810 Age-related osteoporosis without current pathological fracture: Secondary | ICD-10-CM | POA: Diagnosis not present

## 2021-11-28 DIAGNOSIS — E1165 Type 2 diabetes mellitus with hyperglycemia: Secondary | ICD-10-CM | POA: Diagnosis not present

## 2021-11-28 DIAGNOSIS — E559 Vitamin D deficiency, unspecified: Secondary | ICD-10-CM | POA: Diagnosis not present

## 2021-12-01 ENCOUNTER — Other Ambulatory Visit: Payer: Self-pay | Admitting: Internal Medicine

## 2021-12-01 DIAGNOSIS — I10 Essential (primary) hypertension: Secondary | ICD-10-CM

## 2021-12-29 ENCOUNTER — Emergency Department: Payer: PPO

## 2021-12-29 ENCOUNTER — Other Ambulatory Visit: Payer: Self-pay

## 2021-12-29 ENCOUNTER — Emergency Department
Admission: EM | Admit: 2021-12-29 | Discharge: 2021-12-29 | Disposition: A | Discharge status: Home / Self Care | Payer: PPO | Attending: Emergency Medicine | Admitting: Emergency Medicine

## 2021-12-29 ENCOUNTER — Encounter: Payer: Self-pay | Admitting: Radiology

## 2021-12-29 DIAGNOSIS — M1712 Unilateral primary osteoarthritis, left knee: Secondary | ICD-10-CM | POA: Diagnosis not present

## 2021-12-29 DIAGNOSIS — S8002XA Contusion of left knee, initial encounter: Secondary | ICD-10-CM | POA: Diagnosis not present

## 2021-12-29 DIAGNOSIS — I10 Essential (primary) hypertension: Secondary | ICD-10-CM | POA: Diagnosis not present

## 2021-12-29 DIAGNOSIS — M25562 Pain in left knee: Secondary | ICD-10-CM | POA: Diagnosis not present

## 2021-12-29 DIAGNOSIS — S8992XA Unspecified injury of left lower leg, initial encounter: Secondary | ICD-10-CM | POA: Diagnosis present

## 2021-12-29 DIAGNOSIS — M1990 Unspecified osteoarthritis, unspecified site: Secondary | ICD-10-CM

## 2021-12-29 DIAGNOSIS — E119 Type 2 diabetes mellitus without complications: Secondary | ICD-10-CM | POA: Insufficient documentation

## 2021-12-29 DIAGNOSIS — W19XXXA Unspecified fall, initial encounter: Secondary | ICD-10-CM | POA: Diagnosis not present

## 2021-12-29 NOTE — ED Notes (Signed)
Pt and daughter give verbal consent to DC  

## 2021-12-29 NOTE — ED Triage Notes (Signed)
Pt here after a fall today. Pt states the walker that she was using tripped her. Pt denies hitting her head and denies taking blood thinners. Pt c/o left knee pain. ?

## 2021-12-29 NOTE — ED Provider Notes (Signed)
? ?[facility] ?Provider Note ? ? ? Event Date/Time  ? First MD Initiated Contact with Patient 12/29/21 1847   ?  (approximate) ? ? ?History  ? ?Fall and Knee Pain ? ? ?HPI ? ?Yvonne Newton is a 81 y.o. female past medical history of a CVA, DM, HTN, HDL, obesity, and arthritis who presents for evaluation of acute left knee pain after being in a fall earlier today.  Patient states she was using a walker but got away from her and she fell on her left knee.  She did not hit her head and is not on blood thinners.  No LOC.  She denies any acute pain in her hands, wrists, elbows, shoulders, neck, back, chest, abdomen, bilateral hips, ankles or right knee.  Denies any other acute concerns at this time with the pain in the left knee.  She did have some Tylenol prior to arrival.  She has been able to ambulate using a walker since then.  She has another walker that she thinks is a little more safe to use when she gets home. ? ?  ?Past Medical History:  ?Diagnosis Date  ? Arthritis 07/22/2017  ? Cerebellar stroke (HCC)   ? Diabetes mellitus type 2 in nonobese [facility])   ? DM (diabetes mellitus) (HCC)   ? Dysarthria   ? Essential hypertension 07/22/2017  ? Hyperlipidemia, unspecified 07/22/2017  ? OA (osteoarthritis) of knee   ? right  ? Obesity   ? Stroke [facility])   ? Tachycardia 07/22/2017  ? VENTRICULAR TACHYCARDIA 09/05/2009  ? Qualifier: Diagnosis of  By: Susette Racer CMA, Jewel    ? Ventricular tachycardia (HCC)   ? Vitamin B 12 deficiency   ? ? ? ?Physical Exam  ?Triage Vital Signs: ?ED Triage Vitals  ?Enc Vitals Group  ?   BP 12/29/21 1843 (!) 158/54  ?   Pulse Rate 12/29/21 1843 69  ?   Resp 12/29/21 1843 18  ?   Temp 12/29/21 1843 98.4 ?F (36.9 ?C)  ?   Temp Source 12/29/21 1843 Oral  ?   SpO2 12/29/21 1843 93 %  ?   Weight 12/29/21 1844 180 lb (81.6 kg)  ?   Height 12/29/21 1844 5' (1.524 m)  ?   Head Circumference --   ?   Peak Flow --   ?   Pain Score 12/29/21 1843 0  ?   Pain Loc --   ?   Pain Edu?  --   ?   Excl. in GC? --   ? ? ?Most recent vital signs: ?Vitals:  ? 12/29/21 1843  ?BP: (!) 158/54  ?Pulse: 69  ?Resp: 18  ?Temp: 98.4 ?F (36.9 ?C)  ?SpO2: 93%  ? ? ?General: Awake, no distress.  ?CV:  Good peripheral perfusion.  2+ radial and PT pulses. ?Resp:  Normal effort.  Clear bilaterally. ?Abd:  No distention.  Soft. ?Other:  No tenderness along the C/T/L-spine.  Cranial nerves II to XII are grossly intact.  Patient is full and symmetric strength of the bilateral upper and lower extremities.  She is erythema over the anterior aspect of the left knee.  There is no large effusion or deformity. ? ? ?ED Results / Procedures / Treatments  ?Labs ?(all labs ordered are listed, but only abnormal results are displayed) ?Labs Reviewed - No data to display ? ? ?EKG ? ? ?RADIOLOGY ? ?Plain film left knee on my interpretation shows some degenerative changes without any fracture or  dislocation.  I also reviewed radiologist interpretation. ? ? ?PROCEDURES: ? ?Critical Care performed: No ? ?Procedures ? ? ? ?MEDICATIONS ORDERED IN ED: ?Medications - No data to display ? ? ?IMPRESSION / MDM / ASSESSMENT AND PLAN / ED COURSE  ?I reviewed the triage vital signs and the nursing notes. ?             ?               ? ?Differential diagnosis includes, but is not limited to contusion of the left knee versus possible occult patellar or knee fracture.  Patient's history and exam is not suggestive of acute infectious process, DVT, neurovascular injury or other significant visceral orthopedic injury. ? ?Plain film left knee on my interpretation shows some degenerative changes without any fracture or dislocation.  I also reviewed radiologist interpretation. ? ?I found a contusion in the setting of fairly advanced underlying osteoarthritis.  Low suspicion for other immediate life-threatening process.  I believe patient is stable for discharge with outpatient follow-up.  Advised to have blood pressure rechecked.  She has no other acute  concerns.  Discharged in stable condition. ? ?  ? ? ?FINAL CLINICAL IMPRESSION(S) / ED DIAGNOSES  ? ?Final diagnoses:  ?Acute pain of left knee  ?Hypertension, unspecified type  ?Arthritis  ?Contusion of left knee, initial encounter  ? ? ? ?Rx / DC Orders  ? ?ED Discharge Orders   ? ? None  ? ?  ? ? ? ?Note:  This document was prepared using Dragon voice recognition software and may include unintentional dictation errors. ?  ?Gilles Chiquito, MD ?12/29/21 1940 ? ?

## 2022-01-13 DIAGNOSIS — Z7984 Long term (current) use of oral hypoglycemic drugs: Secondary | ICD-10-CM | POA: Diagnosis not present

## 2022-01-13 DIAGNOSIS — E119 Type 2 diabetes mellitus without complications: Secondary | ICD-10-CM | POA: Diagnosis not present

## 2022-03-11 DIAGNOSIS — I10 Essential (primary) hypertension: Secondary | ICD-10-CM | POA: Diagnosis not present

## 2022-03-11 DIAGNOSIS — S60429A Blister (nonthermal) of unspecified finger, initial encounter: Secondary | ICD-10-CM | POA: Diagnosis not present

## 2022-04-11 DIAGNOSIS — D692 Other nonthrombocytopenic purpura: Secondary | ICD-10-CM | POA: Diagnosis not present

## 2022-04-11 DIAGNOSIS — R419 Unspecified symptoms and signs involving cognitive functions and awareness: Secondary | ICD-10-CM | POA: Diagnosis not present

## 2022-04-11 DIAGNOSIS — Z7984 Long term (current) use of oral hypoglycemic drugs: Secondary | ICD-10-CM | POA: Diagnosis not present

## 2022-04-11 DIAGNOSIS — E119 Type 2 diabetes mellitus without complications: Secondary | ICD-10-CM | POA: Diagnosis not present

## 2022-04-11 DIAGNOSIS — Z6835 Body mass index (BMI) 35.0-35.9, adult: Secondary | ICD-10-CM | POA: Diagnosis not present

## 2022-04-11 DIAGNOSIS — I10 Essential (primary) hypertension: Secondary | ICD-10-CM | POA: Diagnosis not present

## 2022-04-17 DIAGNOSIS — I10 Essential (primary) hypertension: Secondary | ICD-10-CM | POA: Diagnosis not present

## 2022-04-17 DIAGNOSIS — E119 Type 2 diabetes mellitus without complications: Secondary | ICD-10-CM | POA: Diagnosis not present

## 2022-04-17 DIAGNOSIS — Z0000 Encounter for general adult medical examination without abnormal findings: Secondary | ICD-10-CM | POA: Diagnosis not present

## 2022-04-17 DIAGNOSIS — E785 Hyperlipidemia, unspecified: Secondary | ICD-10-CM | POA: Diagnosis not present

## 2022-04-17 DIAGNOSIS — I739 Peripheral vascular disease, unspecified: Secondary | ICD-10-CM | POA: Diagnosis not present

## 2022-04-17 DIAGNOSIS — R413 Other amnesia: Secondary | ICD-10-CM | POA: Diagnosis not present

## 2022-05-07 ENCOUNTER — Other Ambulatory Visit: Payer: Self-pay

## 2022-05-07 ENCOUNTER — Emergency Department (HOSPITAL_COMMUNITY): Payer: PPO

## 2022-05-07 ENCOUNTER — Encounter (HOSPITAL_COMMUNITY): Payer: Self-pay

## 2022-05-07 ENCOUNTER — Emergency Department (HOSPITAL_COMMUNITY)
Admission: EM | Admit: 2022-05-07 | Discharge: 2022-05-07 | Disposition: A | Discharge status: Home / Self Care | Payer: PPO | Attending: Emergency Medicine | Admitting: Emergency Medicine

## 2022-05-07 DIAGNOSIS — R55 Syncope and collapse: Secondary | ICD-10-CM | POA: Diagnosis not present

## 2022-05-07 DIAGNOSIS — R410 Disorientation, unspecified: Secondary | ICD-10-CM | POA: Diagnosis not present

## 2022-05-07 DIAGNOSIS — E860 Dehydration: Secondary | ICD-10-CM | POA: Diagnosis not present

## 2022-05-07 DIAGNOSIS — E162 Hypoglycemia, unspecified: Secondary | ICD-10-CM

## 2022-05-07 DIAGNOSIS — I10 Essential (primary) hypertension: Secondary | ICD-10-CM | POA: Insufficient documentation

## 2022-05-07 DIAGNOSIS — Z7982 Long term (current) use of aspirin: Secondary | ICD-10-CM | POA: Insufficient documentation

## 2022-05-07 DIAGNOSIS — R531 Weakness: Secondary | ICD-10-CM | POA: Diagnosis not present

## 2022-05-07 DIAGNOSIS — Z79899 Other long term (current) drug therapy: Secondary | ICD-10-CM | POA: Diagnosis not present

## 2022-05-07 DIAGNOSIS — E11649 Type 2 diabetes mellitus with hypoglycemia without coma: Secondary | ICD-10-CM | POA: Insufficient documentation

## 2022-05-07 DIAGNOSIS — Z7984 Long term (current) use of oral hypoglycemic drugs: Secondary | ICD-10-CM | POA: Diagnosis not present

## 2022-05-07 DIAGNOSIS — R42 Dizziness and giddiness: Secondary | ICD-10-CM | POA: Diagnosis present

## 2022-05-07 DIAGNOSIS — R079 Chest pain, unspecified: Secondary | ICD-10-CM | POA: Diagnosis not present

## 2022-05-07 DIAGNOSIS — R4182 Altered mental status, unspecified: Secondary | ICD-10-CM | POA: Diagnosis not present

## 2022-05-07 LAB — BASIC METABOLIC PANEL
Anion gap: 9 (ref 5–15)
BUN: 20 mg/dL (ref 8–23)
CO2: 25 mmol/L (ref 22–32)
Calcium: 9.2 mg/dL (ref 8.9–10.3)
Chloride: 107 mmol/L (ref 98–111)
Creatinine, Ser: 0.74 mg/dL (ref 0.44–1.00)
GFR, Estimated: >60 mL/min (ref >60)
Glucose, Bld: 68 mg/dL — ABNORMAL LOW (ref 70–99)
Potassium: 4.1 mmol/L (ref 3.5–5.1)
Sodium: 141 mmol/L (ref 135–145)

## 2022-05-07 LAB — TROPONIN I (HIGH SENSITIVITY)
Troponin I (High Sensitivity): 4 ng/L (ref <18)
Troponin I (High Sensitivity): 3 ng/L (ref <18)

## 2022-05-07 LAB — CBC
HCT: 37.8 % (ref 36.0–46.0)
Hemoglobin: 12.1 g/dL (ref 12.0–15.0)
MCH: 28.5 pg (ref 26.0–34.0)
MCHC: 32 g/dL (ref 30.0–36.0)
MCV: 89.2 fL (ref 80.0–100.0)
Platelets: 186 10*3/uL (ref 150–400)
RBC: 4.24 MIL/uL (ref 3.87–5.11)
RDW: 13.7 % (ref 11.5–15.5)
WBC: 10.8 10*3/uL — ABNORMAL HIGH (ref 4.0–10.5)
nRBC: 0 % (ref 0.0–0.2)

## 2022-05-07 LAB — URINALYSIS, ROUTINE W REFLEX MICROSCOPIC
Bilirubin Urine: NEGATIVE
Glucose, UA: NEGATIVE mg/dL
Hgb urine dipstick: NEGATIVE
Ketones, ur: NEGATIVE mg/dL
Leukocytes,Ua: NEGATIVE
Nitrite: NEGATIVE
Protein, ur: NEGATIVE mg/dL
Specific Gravity, Urine: 1.015 (ref 1.005–1.030)
pH: 7 (ref 5.0–8.0)

## 2022-05-07 LAB — CBG MONITORING, ED
Glucose-Capillary: 62 mg/dL — ABNORMAL LOW (ref 70–99)
Glucose-Capillary: 105 mg/dL — ABNORMAL HIGH (ref 70–99)

## 2022-05-07 LAB — MAGNESIUM: Magnesium: 1.5 mg/dL — ABNORMAL LOW (ref 1.7–2.4)

## 2022-05-07 MED ORDER — MAGNESIUM SULFATE 2 GM/50ML IV SOLN
2.0000 g | Freq: Once | INTRAVENOUS | Status: AC
  Administered 2022-05-07: 2 g via INTRAVENOUS
  Filled 2022-05-07: qty 50

## 2022-05-07 NOTE — Discharge Instructions (Addendum)
Today you were seen in the emergency department for your weakness and anxiety.    In the emergency department you had lab work that showed low blood sugar but was otherwise reassuring and you were feeling better.    At home, please stay well-hydrated and well fed.    Check your MyChart online for the results of any tests that had not resulted by the time you left the emergency department.   Follow-up with your primary doctor in 2 days regarding your visit.    Return immediately to the emergency department if you experience any of the following: Confusion, worsening weakness, fevers, chest pains, shortness of breath, or any other concerning symptoms.    Thank you for visiting our Emergency Department. It was a pleasure taking care of you today.

## 2022-05-07 NOTE — ED Notes (Signed)
DC papers reviewed. No questions or concerns. No signs of distress. Pt assisted to wheelchair and out to lobby. Appropriate measures for safety taken. 

## 2022-05-07 NOTE — ED Notes (Addendum)
Patient transported to CT 

## 2022-05-07 NOTE — ED Triage Notes (Signed)
Pt BIB [facility] from home due to near syncopal episode.  Pt has been lethargic, weak and dizzy.  VS BP 130/70, HR 60, Resp 16, CBG 82.  20g left hand.  150 NS.

## 2022-05-07 NOTE — ED Provider Notes (Signed)
[facility] EMERGENCY DEPARTMENT Provider Note   CSN: [id number] Arrival date & time: 05/07/22  1415     History  Chief Complaint  Patient presents with   Near Syncope    Yvonne Newton is a 81 y.o. female.  81 year old female with a history of hypertension, hyperlipidemia, diabetes, CVA without residual deficits who presents emergency department with confusion and dizziness.  History obtained per the patient and her 2 daughters.  They state that she was in her usual state of health until this morning.  They state that she appeared more anxious than usual and repeatedly asked questions that made her appeared to be confused.  They state that after waking from a nap she appeared more somnolent than typical and did not wake up as she usually does.  They got her in the car to go to the doctor she became very dizzy with standing and weak in her bilateral lower extremities which lasted for approximately 15 minutes.  No loss of consciousness or palpitations during that episode.  Does have a history of V. tach and has been implanted loop recorder that was removed earlier in the year.  Also states that she has been feeling chest discomfort in her left chest.  Has difficulty characterizing it but says it has been present over the past week.  Denies any diaphoresis, vomiting, fevers, cough, shortness of breath.  Has a history of UTIs but no treatment for them recently.  No recent falls or head trauma aside from 2 weeks ago.  No injuries as result of the fall.  Only medication change was memantine added 2 weeks ago.   Near Syncope       Home Medications Prior to Admission medications   Medication Sig Start Date End Date Taking? Authorizing Provider  aspirin EC 81 MG EC tablet Take 1 tablet (81 mg total) by mouth daily. 11/05/17   Calvert Cantor, MD  carvedilol (COREG) 12.5 MG tablet TAKE 1 TABLET BY MOUTH TWICE A DAY 12/02/21   Marinus Maw, MD  cetirizine (ZYRTEC) 10 MG  tablet Take 10 mg by mouth daily. 04/14/18   [provider]  CYANOCOBALAMIN IJ Inject as directed every 30 (thirty) days. On or about the 26th of each month    [provider]  diltiazem (CARDIZEM CD) 120 MG 24 hr capsule TAKE 1 CAPSULE BY MOUTH EVERY DAY 11/18/21   Marinus Maw, MD  EPINEPHrine 0.3 mg/0.3 mL IJ SOAJ injection Inject 0.3 mg into the muscle once as needed (severe allergic reaction).    [provider]  flecainide (TAMBOCOR) 50 MG tablet TAKE 1 TABLET (50 MG TOTAL) BY MOUTH 2 (TWO) TIMES DAILY. 02/05/12   Marinus Maw, MD  metFORMIN (GLUCOPHAGE-XR) 500 MG 24 hr tablet Take 500-1,000 mg by mouth See admin instructions. Take one tablet (500 mg) by mouth every morning and two tablets (1000 mg) at bedtime 01/25/16   [provider]  omeprazole (PRILOSEC) 20 MG capsule Take 20 mg by mouth daily.     [provider]  rosuvastatin (CRESTOR) 5 MG tablet Take 1 tablet (5 mg total) by mouth daily at 6 PM. 01/19/18   Ihor Austin, NP  Vitamin D, Ergocalciferol, (DRISDOL) 50000 units CAPS capsule Take 50,000 Units by mouth every 7 (seven) days.    [provider]      Allergies    Blueberry flavor, Sulfa antibiotics, Sulfonamide derivatives, and Other    Review of Systems   Review of  Systems  Cardiovascular:  Positive for near-syncope.    Physical Exam Updated Vital Signs BP (!) 164/59 (BP Location: Right Arm)   Pulse (!) 59   Temp 98.5 F (36.9 C) (Oral)   Resp 19   Ht 5' (1.524 m)   Wt 78 kg   SpO2 96%   BMI 33.59 kg/m  Physical Exam Vitals and nursing note reviewed.  Constitutional:      General: She is not in acute distress.    Appearance: She is well-developed.  HENT:     Head: Normocephalic and atraumatic.     Right Ear: External ear normal.     Left Ear: External ear normal.     Nose: Nose normal.     Mouth/Throat:     Mouth: Mucous membranes are dry.  Eyes:     Extraocular Movements: Extraocular movements  intact.     Conjunctiva/sclera: Conjunctivae normal.     Pupils: Pupils are equal, round, and reactive to light.  Cardiovascular:     Rate and Rhythm: Regular rhythm. Bradycardia present.     Heart sounds: No murmur heard. Pulmonary:     Effort: Pulmonary effort is normal. No respiratory distress.     Breath sounds: Normal breath sounds.  Abdominal:     General: Abdomen is flat. There is no distension.     Palpations: Abdomen is soft. There is no mass.     Tenderness: There is no abdominal tenderness. There is no guarding.  Musculoskeletal:        General: No swelling.     Cervical back: Normal range of motion and neck supple.     Right lower leg: Edema (Trace) present.     Left lower leg: Edema (Trace) present.  Skin:    General: Skin is warm and dry.     Capillary Refill: Capillary refill takes less than 2 seconds.  Neurological:     Mental Status: She is alert and oriented to person, place, and time. Mental status is at baseline.     Comments: MENTAL STATUS: AAOx3 CRANIAL NERVES: II: Pupils equal and reactive 3 mm BL, no RAPD, no VF deficits III, IV, VI: EOM intact, no gaze preference or deviation, no nystagmus. V: normal sensation to light touch in V1, V2, and V3 segments bilaterally VII: no facial weakness or asymmetry, no nasolabial fold flattening VIII: normal hearing to speech and finger friction IX, X: normal palatal elevation, no uvular deviation XI: 5/5 head turn and 5/5 shoulder shrug bilaterally XII: midline tongue protrusion MOTOR: 5/5 strength in R shoulder flexion, elbow flexion and extension, and grip strength. 5/5 strength in L shoulder flexion, elbow flexion and extension, and grip strength.  5/5 strength in R hip and knee flexion, knee extension, ankle plantar and dorsiflexion. 5/5 strength in L hip and knee flexion, knee extension, ankle plantar and dorsiflexion. SENSORY: Normal sensation to light touch in all extremities COORD: Normal finger to nose and heel  to shin, no tremor, no dysmetria STATION: no truncal ataxia GAIT: normal  Psychiatric:        Mood and Affect: Mood normal.     ED Results / Procedures / Treatments   Labs (all labs ordered are listed, but only abnormal results are displayed) Labs Reviewed  BASIC METABOLIC PANEL - Abnormal; Notable for the following components:      Result Value   Glucose, Bld 68 (*)    All other components within normal limits  CBC - Abnormal; Notable for the following components:  WBC 10.8 (*)    All other components within normal limits  MAGNESIUM - Abnormal; Notable for the following components:   Magnesium 1.5 (*)    All other components within normal limits  CBG MONITORING, ED - Abnormal; Notable for the following components:   Glucose-Capillary 62 (*)    All other components within normal limits  CBG MONITORING, ED - Abnormal; Notable for the following components:   Glucose-Capillary 105 (*)    All other components within normal limits  URINALYSIS, ROUTINE W REFLEX MICROSCOPIC  TROPONIN I (HIGH SENSITIVITY)  TROPONIN I (HIGH SENSITIVITY)    EKG EKG Interpretation  Date/Time:  Wednesday May 07 2022 14:32:43 EDT Ventricular Rate:  58 PR Interval:  250 QRS Duration: 114 QT Interval:  461 QTC Calculation: 453 R Axis:   -32 Text Interpretation: Sinus or ectopic atrial rhythm Prolonged PR interval Incomplete left bundle branch block Confirmed by Alvester Chou [phone]) on 05/07/2022 2:47:30 PM  Radiology CT Head Wo Contrast  Result Date: 05/07/2022 CLINICAL DATA:  Mental status change, unknown cause EXAM: CT HEAD WITHOUT CONTRAST TECHNIQUE: Contiguous axial images were obtained from the base of the skull through the vertex without intravenous contrast. RADIATION DOSE REDUCTION: This exam was performed according to the departmental dose-optimization program which includes automated exposure control, adjustment of the mA and/or kV according to patient size and/or use of iterative  reconstruction technique. COMPARISON:  CT head November 02, 2017. FINDINGS: Brain: Prior superior left cerebellar infarct. No evidence of acute large vascular territory infarct acute hemorrhage, mass lesion, midline shift or hydrocephalus. Patchy white matter hypodensities, nonspecific but compatible with chronic microvascular ischemic disease. Vascular: No hyperdense vessel identified. Skull: No acute fracture. Sinuses/Orbits: No acute findings. Other: No mastoid effusions. IMPRESSION: 1. No evidence of acute intracranial abnormality. 2. Prior superior left cerebellar infarct. Electronically Signed   By: Feliberto Harts M.D.   On: 05/07/2022 16:11   DG Chest Portable 1 View  Result Date: 05/07/2022 CLINICAL DATA:  Near-syncope EXAM: PORTABLE CHEST 1 VIEW COMPARISON:  None Available. FINDINGS: Cardiac and mediastinal contours are within normal limits for AP technique. Both lungs are clear. The visualized skeletal structures are unremarkable. IMPRESSION: No active disease. Electronically Signed   By: Allegra Lai M.D.   On: 05/07/2022 15:40    Procedures Procedures   Medications Ordered in ED Medications  magnesium sulfate IVPB 2 g 50 mL (0 g Intravenous Stopped 05/07/22 2016)    ED Course/ Medical Decision Making/ A&P                           Medical Decision Making Amount and/or Complexity of Data Reviewed Labs: ordered. Radiology: ordered.  Risk Prescription drug management.   81 year old female with a history of hypertension, hyperlipidemia, diabetes, ventricular tachycardia, CVA without residual deficits who presents emergency department with confusion and dizziness.    Initial Ddx:  Generalized encephalopathy (could be secondary to urinary tract infection or amantadine), stroke, orthostasis, arrhythmia, MI, hypoglycemia  MDM:  Feel the patient likely has generalized encephalopathy which could be due to urinary tract infection or amantadine or dehydration.  Patient's dizziness is  most consistent with orthostasis.  Will obtain EKG, troponin, and electrolytes to evaluate for possible arrhythmia and/or MI.  Considered stroke on the differential but no focal neurologic deficits at this time and is able to ambulate without difficulty for me.  The patient's history of diabetes hyperglycemia also on the  Plan:  POC glucose Labs  Troponin EKG Chest x-ray Urinalysis Head CT  ED Summary:  Patient underwent the above work-up which is reassuring.  Her glucose was noted to be low and she was given something to eat and reported feeling back to her baseline after that.  She had serial troponins that were WNL and her EKG did not show new ischemic changes or signs of arrhythmia.  Patient's magnesium was low and was repleted.  No signs of urinalysis.  CT head showed prior infarct which patient is already aware of.  Patient was feeling much better and was requesting to go home.  At this time she feels that it is safe for her to do so and feel that her symptoms may have been caused by transient hypoglycemia or may potentially be an effect of the amantadine that she was started on.  Discussed this with her and her daughter and informed them that they will need to follow-up with her primary doctor in several days.  Patient able to ambulate throughout the emergency department without difficulty prior to discharge.  Dispo: DC Home. Return precautions discussed including, but not limited to, those listed in the AVS. Allowed pt time to ask questions which were answered fully prior to dc.   Additional history obtained from daughter Records reviewed OP Notes The following labs were independently interpreted: Chemistry, Serial Troponins, and Urinalysis I independently visualized the following imaging with scope of interpretation limited to determining acute life threatening conditions related to emergency care: CT Head, which revealed no acute hemorrhage or mass noted   Final Clinical Impression(s)  / ED Diagnoses Final diagnoses:  Dehydration  Weakness  Hypoglycemia    Rx / DC Orders ED Discharge Orders     None         Rondel Baton, MD 05/08/22 1109

## 2022-06-03 DIAGNOSIS — M810 Age-related osteoporosis without current pathological fracture: Secondary | ICD-10-CM | POA: Diagnosis not present

## 2022-06-03 DIAGNOSIS — I152 Hypertension secondary to endocrine disorders: Secondary | ICD-10-CM | POA: Diagnosis not present

## 2022-06-03 DIAGNOSIS — E1169 Type 2 diabetes mellitus with other specified complication: Secondary | ICD-10-CM | POA: Diagnosis not present

## 2022-06-03 DIAGNOSIS — E1159 Type 2 diabetes mellitus with other circulatory complications: Secondary | ICD-10-CM | POA: Diagnosis not present

## 2022-06-03 DIAGNOSIS — E1165 Type 2 diabetes mellitus with hyperglycemia: Secondary | ICD-10-CM | POA: Diagnosis not present

## 2022-06-03 DIAGNOSIS — E559 Vitamin D deficiency, unspecified: Secondary | ICD-10-CM | POA: Diagnosis not present

## 2022-06-03 DIAGNOSIS — E785 Hyperlipidemia, unspecified: Secondary | ICD-10-CM | POA: Diagnosis not present

## 2022-06-04 DIAGNOSIS — I10 Essential (primary) hypertension: Secondary | ICD-10-CM | POA: Diagnosis not present

## 2022-06-04 DIAGNOSIS — E119 Type 2 diabetes mellitus without complications: Secondary | ICD-10-CM | POA: Diagnosis not present

## 2022-06-04 DIAGNOSIS — E860 Dehydration: Secondary | ICD-10-CM | POA: Diagnosis not present

## 2022-06-26 DIAGNOSIS — E119 Type 2 diabetes mellitus without complications: Secondary | ICD-10-CM | POA: Diagnosis not present

## 2022-06-26 DIAGNOSIS — I10 Essential (primary) hypertension: Secondary | ICD-10-CM | POA: Diagnosis not present

## 2022-06-26 DIAGNOSIS — Z7984 Long term (current) use of oral hypoglycemic drugs: Secondary | ICD-10-CM | POA: Diagnosis not present

## 2022-06-27 DIAGNOSIS — M4727 Other spondylosis with radiculopathy, lumbosacral region: Secondary | ICD-10-CM | POA: Diagnosis not present

## 2022-07-03 DIAGNOSIS — R519 Headache, unspecified: Secondary | ICD-10-CM | POA: Diagnosis not present

## 2022-07-03 DIAGNOSIS — I10 Essential (primary) hypertension: Secondary | ICD-10-CM | POA: Diagnosis not present

## 2022-07-03 DIAGNOSIS — I779 Disorder of arteries and arterioles, unspecified: Secondary | ICD-10-CM | POA: Diagnosis not present

## 2022-07-03 DIAGNOSIS — I639 Cerebral infarction, unspecified: Secondary | ICD-10-CM | POA: Diagnosis not present

## 2022-07-03 DIAGNOSIS — R630 Anorexia: Secondary | ICD-10-CM | POA: Diagnosis not present

## 2022-07-03 DIAGNOSIS — E119 Type 2 diabetes mellitus without complications: Secondary | ICD-10-CM | POA: Diagnosis not present

## 2022-07-03 DIAGNOSIS — F03A0 Unspecified dementia, mild, without behavioral disturbance, psychotic disturbance, mood disturbance, and anxiety: Secondary | ICD-10-CM | POA: Diagnosis not present

## 2022-07-03 DIAGNOSIS — M1990 Unspecified osteoarthritis, unspecified site: Secondary | ICD-10-CM | POA: Diagnosis not present

## 2022-07-07 ENCOUNTER — Other Ambulatory Visit (HOSPITAL_COMMUNITY): Payer: Self-pay | Admitting: Neurology

## 2022-07-07 DIAGNOSIS — F03A0 Unspecified dementia, mild, without behavioral disturbance, psychotic disturbance, mood disturbance, and anxiety: Secondary | ICD-10-CM

## 2022-07-11 ENCOUNTER — Emergency Department: Payer: PPO

## 2022-07-11 ENCOUNTER — Inpatient Hospital Stay
Admission: EM | Admit: 2022-07-11 | Discharge: 2022-07-15 | DRG: 280 | Disposition: A | Discharge status: Home / Self Care | Payer: PPO | Attending: Obstetrics and Gynecology | Admitting: Obstetrics and Gynecology

## 2022-07-11 DIAGNOSIS — E785 Hyperlipidemia, unspecified: Secondary | ICD-10-CM | POA: Diagnosis not present

## 2022-07-11 DIAGNOSIS — R7989 Other specified abnormal findings of blood chemistry: Secondary | ICD-10-CM | POA: Diagnosis not present

## 2022-07-11 DIAGNOSIS — Z79899 Other long term (current) drug therapy: Secondary | ICD-10-CM | POA: Diagnosis not present

## 2022-07-11 DIAGNOSIS — I472 Ventricular tachycardia, unspecified: Secondary | ICD-10-CM | POA: Diagnosis not present

## 2022-07-11 DIAGNOSIS — E118 Type 2 diabetes mellitus with unspecified complications: Secondary | ICD-10-CM | POA: Diagnosis not present

## 2022-07-11 DIAGNOSIS — E669 Obesity, unspecified: Secondary | ICD-10-CM | POA: Diagnosis not present

## 2022-07-11 DIAGNOSIS — E876 Hypokalemia: Secondary | ICD-10-CM | POA: Diagnosis present

## 2022-07-11 DIAGNOSIS — I5033 Acute on chronic diastolic (congestive) heart failure: Secondary | ICD-10-CM

## 2022-07-11 DIAGNOSIS — I214 Non-ST elevation (NSTEMI) myocardial infarction: Secondary | ICD-10-CM | POA: Diagnosis not present

## 2022-07-11 DIAGNOSIS — I5021 Acute systolic (congestive) heart failure: Secondary | ICD-10-CM | POA: Diagnosis not present

## 2022-07-11 DIAGNOSIS — Z8249 Family history of ischemic heart disease and other diseases of the circulatory system: Secondary | ICD-10-CM | POA: Diagnosis not present

## 2022-07-11 DIAGNOSIS — E782 Mixed hyperlipidemia: Secondary | ICD-10-CM | POA: Diagnosis not present

## 2022-07-11 DIAGNOSIS — R079 Chest pain, unspecified: Secondary | ICD-10-CM

## 2022-07-11 DIAGNOSIS — Z87891 Personal history of nicotine dependence: Secondary | ICD-10-CM

## 2022-07-11 DIAGNOSIS — Z7984 Long term (current) use of oral hypoglycemic drugs: Secondary | ICD-10-CM | POA: Diagnosis not present

## 2022-07-11 DIAGNOSIS — Z20822 Contact with and (suspected) exposure to covid-19: Secondary | ICD-10-CM | POA: Diagnosis not present

## 2022-07-11 DIAGNOSIS — F03A18 Unspecified dementia, mild, with other behavioral disturbance: Secondary | ICD-10-CM | POA: Diagnosis present

## 2022-07-11 DIAGNOSIS — Z7982 Long term (current) use of aspirin: Secondary | ICD-10-CM | POA: Diagnosis not present

## 2022-07-11 DIAGNOSIS — I5032 Chronic diastolic (congestive) heart failure: Secondary | ICD-10-CM | POA: Diagnosis present

## 2022-07-11 DIAGNOSIS — J9600 Acute respiratory failure, unspecified whether with hypoxia or hypercapnia: Secondary | ICD-10-CM | POA: Diagnosis not present

## 2022-07-11 DIAGNOSIS — I5181 Takotsubo syndrome: Secondary | ICD-10-CM | POA: Diagnosis not present

## 2022-07-11 DIAGNOSIS — R0602 Shortness of breath: Secondary | ICD-10-CM | POA: Diagnosis not present

## 2022-07-11 DIAGNOSIS — I2510 Atherosclerotic heart disease of native coronary artery without angina pectoris: Secondary | ICD-10-CM | POA: Diagnosis not present

## 2022-07-11 DIAGNOSIS — F0390 Unspecified dementia without behavioral disturbance: Secondary | ICD-10-CM | POA: Insufficient documentation

## 2022-07-11 DIAGNOSIS — I959 Hypotension, unspecified: Secondary | ICD-10-CM | POA: Diagnosis not present

## 2022-07-11 DIAGNOSIS — E119 Type 2 diabetes mellitus without complications: Secondary | ICD-10-CM | POA: Diagnosis not present

## 2022-07-11 DIAGNOSIS — I25110 Atherosclerotic heart disease of native coronary artery with unstable angina pectoris: Secondary | ICD-10-CM | POA: Diagnosis not present

## 2022-07-11 DIAGNOSIS — Z8673 Personal history of transient ischemic attack (TIA), and cerebral infarction without residual deficits: Secondary | ICD-10-CM | POA: Diagnosis not present

## 2022-07-11 DIAGNOSIS — I110 Hypertensive heart disease with heart failure: Secondary | ICD-10-CM | POA: Diagnosis not present

## 2022-07-11 DIAGNOSIS — Z9861 Coronary angioplasty status: Secondary | ICD-10-CM | POA: Diagnosis not present

## 2022-07-11 DIAGNOSIS — T68XXXA Hypothermia, initial encounter: Secondary | ICD-10-CM | POA: Diagnosis not present

## 2022-07-11 DIAGNOSIS — M549 Dorsalgia, unspecified: Secondary | ICD-10-CM | POA: Diagnosis not present

## 2022-07-11 DIAGNOSIS — R0689 Other abnormalities of breathing: Secondary | ICD-10-CM | POA: Diagnosis not present

## 2022-07-11 DIAGNOSIS — R531 Weakness: Secondary | ICD-10-CM | POA: Diagnosis not present

## 2022-07-11 DIAGNOSIS — I10 Essential (primary) hypertension: Secondary | ICD-10-CM | POA: Diagnosis not present

## 2022-07-11 DIAGNOSIS — J9601 Acute respiratory failure with hypoxia: Secondary | ICD-10-CM

## 2022-07-11 DIAGNOSIS — Z8679 Personal history of other diseases of the circulatory system: Secondary | ICD-10-CM

## 2022-07-11 LAB — APTT: aPTT: 105 seconds — ABNORMAL HIGH (ref 24–36)

## 2022-07-11 LAB — LACTIC ACID, PLASMA
Lactic Acid, Venous: 1.8 mmol/L (ref 0.5–1.9)
Lactic Acid, Venous: 1.4 mmol/L (ref 0.5–1.9)

## 2022-07-11 LAB — COMPREHENSIVE METABOLIC PANEL
ALT: 18 U/L (ref 0–44)
AST: 24 U/L (ref 15–41)
Albumin: 4.1 g/dL (ref 3.5–5.0)
Alkaline Phosphatase: 50 U/L (ref 38–126)
Anion gap: 8 (ref 5–15)
BUN: 20 mg/dL (ref 8–23)
CO2: 25 mmol/L (ref 22–32)
Calcium: 9.4 mg/dL (ref 8.9–10.3)
Chloride: 106 mmol/L (ref 98–111)
Creatinine, Ser: 0.55 mg/dL (ref 0.44–1.00)
GFR, Estimated: >60 mL/min (ref >60)
Glucose, Bld: 110 mg/dL — ABNORMAL HIGH (ref 70–99)
Potassium: 4.3 mmol/L (ref 3.5–5.1)
Sodium: 139 mmol/L (ref 135–145)
Total Bilirubin: 1.2 mg/dL (ref 0.3–1.2)
Total Protein: 7.3 g/dL (ref 6.5–8.1)

## 2022-07-11 LAB — RESP PANEL BY RT-PCR (FLU A&B, COVID) ARPGX2
Influenza A by PCR: NEGATIVE
Influenza B by PCR: NEGATIVE
SARS Coronavirus 2 by RT PCR: NEGATIVE

## 2022-07-11 LAB — CBC WITH DIFFERENTIAL/PLATELET
Abs Immature Granulocytes: 0.05 10*3/uL (ref 0.00–0.07)
Basophils Absolute: 0.1 10*3/uL (ref 0.0–0.1)
Basophils Relative: 1 %
Eosinophils Absolute: 0.4 10*3/uL (ref 0.0–0.5)
Eosinophils Relative: 3 %
HCT: 40.2 % (ref 36.0–46.0)
Hemoglobin: 13.1 g/dL (ref 12.0–15.0)
Immature Granulocytes: 0 %
Lymphocytes Relative: 32 %
Lymphs Abs: 4.1 10*3/uL — ABNORMAL HIGH (ref 0.7–4.0)
MCH: 28.4 pg (ref 26.0–34.0)
MCHC: 32.6 g/dL (ref 30.0–36.0)
MCV: 87 fL (ref 80.0–100.0)
Monocytes Absolute: 0.8 10*3/uL (ref 0.1–1.0)
Monocytes Relative: 6 %
Neutro Abs: 7.5 10*3/uL (ref 1.7–7.7)
Neutrophils Relative %: 58 %
Platelets: 226 10*3/uL (ref 150–400)
RBC: 4.62 MIL/uL (ref 3.87–5.11)
RDW: 13.6 % (ref 11.5–15.5)
WBC: 12.9 10*3/uL — ABNORMAL HIGH (ref 4.0–10.5)
nRBC: 0 % (ref 0.0–0.2)

## 2022-07-11 LAB — PROTIME-INR
INR: 1.1 (ref 0.8–1.2)
Prothrombin Time: 14.2 seconds (ref 11.4–15.2)

## 2022-07-11 LAB — BRAIN NATRIURETIC PEPTIDE
B Natriuretic Peptide: 55.7 pg/mL (ref 0.0–100.0)
B Natriuretic Peptide: 119.9 pg/mL — ABNORMAL HIGH (ref 0.0–100.0)

## 2022-07-11 LAB — TROPONIN I (HIGH SENSITIVITY)
Troponin I (High Sensitivity): 86 ng/L — ABNORMAL HIGH (ref <18)
Troponin I (High Sensitivity): 259 ng/L (ref <18)
Troponin I (High Sensitivity): 459 ng/L (ref <18)

## 2022-07-11 LAB — GLUCOSE, CAPILLARY: Glucose-Capillary: 117 mg/dL — ABNORMAL HIGH (ref 70–99)

## 2022-07-11 LAB — D-DIMER, QUANTITATIVE: D-Dimer, Quant: 0.55 ug/mL-FEU — ABNORMAL HIGH (ref 0.00–0.50)

## 2022-07-11 MED ORDER — ACETAMINOPHEN 325 MG RE SUPP: 650.0000 mg | Freq: Four times a day (QID) | RECTAL | Status: DC | PRN

## 2022-07-11 MED ORDER — FUROSEMIDE 10 MG/ML IJ SOLN
40.0000 mg | Freq: Once | INTRAMUSCULAR | Status: AC
  Administered 2022-07-11: 40 mg via INTRAVENOUS
  Filled 2022-07-11: qty 4

## 2022-07-11 MED ORDER — FLECAINIDE ACETATE 50 MG PO TABS
50.0000 mg | ORAL_TABLET | Freq: Two times a day (BID) | ORAL | Status: DC
  Administered 2022-07-11 – 2022-07-13 (×5): 50 mg via ORAL
  Filled 2022-07-11 (×5): qty 1

## 2022-07-11 MED ORDER — HEPARIN BOLUS VIA INFUSION
3200.0000 [IU] | Freq: Once | INTRAVENOUS | Status: AC
  Administered 2022-07-11: 3200 [IU] via INTRAVENOUS
  Filled 2022-07-11: qty 3200

## 2022-07-11 MED ORDER — SODIUM CHLORIDE 0.9 % IV SOLN: INTRAVENOUS | Status: DC | PRN

## 2022-07-11 MED ORDER — ONDANSETRON HCL 4 MG/2ML IJ SOLN: 4.0000 mg | Freq: Four times a day (QID) | INTRAMUSCULAR | Status: DC | PRN

## 2022-07-11 MED ORDER — INSULIN ASPART 100 UNIT/ML IJ SOLN
0.0000 [IU] | INTRAMUSCULAR | Status: DC
  Administered 2022-07-12: 2 [IU] via SUBCUTANEOUS
  Administered 2022-07-12: 3 [IU] via SUBCUTANEOUS
  Administered 2022-07-12: 2 [IU] via SUBCUTANEOUS
  Administered 2022-07-12: 3 [IU] via SUBCUTANEOUS
  Administered 2022-07-12: 2 [IU] via SUBCUTANEOUS
  Administered 2022-07-13 (×2): 3 [IU] via SUBCUTANEOUS
  Administered 2022-07-13 – 2022-07-14 (×4): 2 [IU] via SUBCUTANEOUS
  Administered 2022-07-14: 3 [IU] via SUBCUTANEOUS
  Administered 2022-07-15 (×3): 2 [IU] via SUBCUTANEOUS
  Filled 2022-07-11 (×15): qty 1

## 2022-07-11 MED ORDER — PANTOPRAZOLE SODIUM 40 MG PO TBEC
40.0000 mg | DELAYED_RELEASE_TABLET | Freq: Every day | ORAL | Status: DC
  Administered 2022-07-12 – 2022-07-15 (×3): 40 mg via ORAL
  Filled 2022-07-11 (×3): qty 1

## 2022-07-11 MED ORDER — ACETAMINOPHEN 325 MG PO TABS: 650.0000 mg | ORAL_TABLET | Freq: Four times a day (QID) | ORAL | Status: DC | PRN

## 2022-07-11 MED ORDER — HEPARIN (PORCINE) 25000 UT/250ML-% IV SOLN: 10.0000 [IU]/kg/h | INTRAVENOUS | Status: DC

## 2022-07-11 MED ORDER — ROSUVASTATIN CALCIUM 5 MG PO TABS
5.0000 mg | ORAL_TABLET | Freq: Every day | ORAL | Status: DC
  Administered 2022-07-12 – 2022-07-14 (×3): 5 mg via ORAL
  Filled 2022-07-11 (×3): qty 1

## 2022-07-11 MED ORDER — ASPIRIN 81 MG PO TBEC
81.0000 mg | DELAYED_RELEASE_TABLET | Freq: Every day | ORAL | Status: DC
  Administered 2022-07-12 – 2022-07-15 (×4): 81 mg via ORAL
  Filled 2022-07-11 (×4): qty 1

## 2022-07-11 MED ORDER — HEPARIN SODIUM (PORCINE) 5000 UNIT/ML IJ SOLN: 60.0000 [IU]/kg | Freq: Once | INTRAMUSCULAR | Status: DC

## 2022-07-11 MED ORDER — MEMANTINE HCL 5 MG PO TABS
10.0000 mg | ORAL_TABLET | Freq: Two times a day (BID) | ORAL | Status: DC
  Administered 2022-07-11 – 2022-07-15 (×7): 10 mg via ORAL
  Filled 2022-07-11 (×7): qty 2

## 2022-07-11 MED ORDER — ACETAMINOPHEN 325 MG PO TABS: 650.0000 mg | ORAL_TABLET | ORAL | Status: DC | PRN

## 2022-07-11 MED ORDER — MORPHINE SULFATE (PF) 2 MG/ML IV SOLN: 2.0000 mg | INTRAVENOUS | Status: DC | PRN

## 2022-07-11 MED ORDER — IOHEXOL 350 MG/ML SOLN
75.0000 mL | Freq: Once | INTRAVENOUS | Status: AC | PRN
  Administered 2022-07-11: 75 mL via INTRAVENOUS

## 2022-07-11 MED ORDER — CARVEDILOL 12.5 MG PO TABS
12.5000 mg | ORAL_TABLET | Freq: Two times a day (BID) | ORAL | Status: DC
  Administered 2022-07-11 – 2022-07-15 (×7): 12.5 mg via ORAL
  Filled 2022-07-11 (×7): qty 1

## 2022-07-11 MED ORDER — NITROGLYCERIN 0.4 MG SL SUBL: 0.4000 mg | SUBLINGUAL_TABLET | SUBLINGUAL | Status: DC | PRN

## 2022-07-11 MED ORDER — ASPIRIN 81 MG PO CHEW
324.0000 mg | CHEWABLE_TABLET | Freq: Once | ORAL | Status: AC
  Administered 2022-07-11: 324 mg via ORAL
  Filled 2022-07-11: qty 4

## 2022-07-11 MED ORDER — ONDANSETRON HCL 4 MG PO TABS: 4.0000 mg | ORAL_TABLET | Freq: Four times a day (QID) | ORAL | Status: DC | PRN

## 2022-07-11 MED ORDER — HEPARIN (PORCINE) 25000 UT/250ML-% IV SOLN
1100.0000 [IU]/h | INTRAVENOUS | Status: DC
  Administered 2022-07-11: 750 [IU]/h via INTRAVENOUS
  Administered 2022-07-12 – 2022-07-13 (×2): 1100 [IU]/h via INTRAVENOUS
  Filled 2022-07-11 (×3): qty 250

## 2022-07-11 NOTE — Assessment & Plan Note (Addendum)
Patient presents with exertional chest pain, typical.  EKG with lateral T wave inversions and troponin bump 80-259 Suspecting type I NSTEMI but could be type II related to CHF exacerbation Received aspirin 324 mg in the ED Continue heparin infusion Continue home aspirin, Coreg, rosuvastatin Nitroglycerin sublingual as needed chest pain with morphine for breakthrough Cardiology consult Get echocardiogram in the a.m.

## 2022-07-11 NOTE — Assessment & Plan Note (Signed)
Likely secondary to NSTEMI as outlined above CTA chest negative for PE Some concern for viral infection given leukocytosis, CTA findings of possible atypical infection, however no clinical correlation Continue to monitor for additional signs and symptoms to suggest a viral infection Follow-up COVID and flu

## 2022-07-11 NOTE — Assessment & Plan Note (Addendum)
Patient with lower extremity edema shortness of breath, worsening since arrival to the hospital associated with orthopnea Suspect triggered by NSTEMI or may be primary etiology Continuous cardiac monitoring to eval.  For arrhythmias in view of history of ventricular tachycardia  follow-up BNP IV Lasix and continue home Coreg Daily weights with intake and output monitoring Echocardiogram in the a.m. Cardiology consult

## 2022-07-11 NOTE — ED Provider Notes (Signed)
[facility] Provider Note    Event Date/Time   First MD Initiated Contact with Patient 07/11/22 1659     (approximate)   History   Back Pain and Shortness of Breath   HPI  Yvonne Newton is a 81 y.o. female who reports she was outside and began to get bilateral arm pain.  Her arms got cold.  She got inside and developed pain across her back.  EMS said she told them that she had pain in her chest as well.  She had some sweating with it and shortness of breath.  She had some nausea with it.  Reportedly when she belches she feels better.  Currently she is not having any more pain.   Past medical history includes stroke hypertension diabetes V. tach and reportedly dementia mild   Physical Exam   Triage Vital Signs: ED Triage Vitals  Enc Vitals Group     BP      Pulse      Resp      Temp      Temp src      SpO2      Weight      Height      Head Circumference      Peak Flow      Pain Score      Pain Loc      Pain Edu?      Excl. in GC?     Most recent vital signs: Vitals:   07/11/22 2135 07/11/22 2205  BP: (!) 137/57 (!) 112/99  Pulse: 76 82  Resp: 20 (!) 26  Temp: 98 F (36.7 C)   SpO2: 100% 98%     General: Awake, no distress.  CV:  Good peripheral perfusion.  Heart regular rate and rhythm no audible murmurs Resp:  Normal effort.  Lungs are clear Back and chest are nontender to palpation percussion Abd:  No distention.  Soft and nontender Extremities no pitting edema   ED Results / Procedures / Treatments   Labs (all labs ordered are listed, but only abnormal results are displayed) Labs Reviewed  COMPREHENSIVE METABOLIC PANEL - Abnormal; Notable for the following components:      Result Value   Glucose, Bld 110 (*)    All other components within normal limits  CBC WITH DIFFERENTIAL/PLATELET - Abnormal; Notable for the following components:   WBC 12.9 (*)    Lymphs Abs 4.1 (*)    All other components within normal  limits  D-DIMER, QUANTITATIVE - Abnormal; Notable for the following components:   D-Dimer, Quant 0.55 (*)    All other components within normal limits  APTT - Abnormal; Notable for the following components:   aPTT 105 (*)    All other components within normal limits  GLUCOSE, CAPILLARY - Abnormal; Notable for the following components:   Glucose-Capillary 117 (*)    All other components within normal limits  BRAIN NATRIURETIC PEPTIDE - Abnormal; Notable for the following components:   B Natriuretic Peptide 119.9 (*)    All other components within normal limits  TROPONIN I (HIGH SENSITIVITY) - Abnormal; Notable for the following components:   Troponin I (High Sensitivity) 86 (*)    All other components within normal limits  TROPONIN I (HIGH SENSITIVITY) - Abnormal; Notable for the following components:   Troponin I (High Sensitivity) 259 (*)    All other components within normal limits  TROPONIN I (HIGH SENSITIVITY) - Abnormal; Notable for the following components:  Troponin I (High Sensitivity) 459 (*)    All other components within normal limits  RESP PANEL BY RT-PCR (FLU A&B, COVID) ARPGX2  BRAIN NATRIURETIC PEPTIDE  LACTIC ACID, PLASMA  LACTIC ACID, PLASMA  PROTIME-INR  HEPARIN LEVEL (UNFRACTIONATED)  CBC  HEMOGLOBIN A1C  LIPOPROTEIN A (LPA)  URINALYSIS, COMPLETE (UACMP) WITH MICROSCOPIC     EKG  Initial EKG read interpreted by me shows normal sinus rhythm rate of 76 left axis no acute ST-T wave changes except there is a flipped T in lead L. Second EKG read and interpreted by me shows normal sinus rhythm rate of 74 left axis patient now has a normal T in aVL and flipped T's in V5 and V6.  RADIOLOGY Chest x-ray read by radiology reviewed and interpreted by me shows some cardiomegaly with some vascular congestion. Patient's pain had resolved and when she came to the emergency room so I did a CT since her D-dimer was elevated.  This showed no PE she had some bronchial wall  thickening and some sclerotic scattered groundglass airspace disease compatible with atypical infection.  PROCEDURES:  Critical Care performed: Critical care time 25 minutes.  This includes reviewing the patient's studies and speaking with her and then speaking with her and her family about the findings.  We are giving her heparin and aspirin at this point.  I am talking to the hospital doctor  Procedures   MEDICATIONS ORDERED IN ED: Medications  heparin ADULT infusion 100 units/mL (25000 units/256mL) (750 Units/hr Intravenous Infusion Verify 07/11/22 2200)  0.9 %  sodium chloride infusion ( Intravenous Infusion Verify 07/11/22 2200)  aspirin EC tablet 81 mg (has no administration in time range)  carvedilol (COREG) tablet 12.5 mg (12.5 mg Oral Given 07/11/22 2329)  rosuvastatin (CRESTOR) tablet 5 mg (has no administration in time range)  flecainide (TAMBOCOR) tablet 50 mg (50 mg Oral Given 07/11/22 2329)  memantine (NAMENDA) tablet 10 mg (10 mg Oral Given 07/11/22 2329)  pantoprazole (PROTONIX) EC tablet 40 mg (has no administration in time range)  nitroGLYCERIN (NITROSTAT) SL tablet 0.4 mg (has no administration in time range)  acetaminophen (TYLENOL) tablet 650 mg (has no administration in time range)  insulin aspart (novoLOG) injection 0-15 Units ( Subcutaneous Not Given 07/11/22 2332)  ondansetron (ZOFRAN) tablet 4 mg (has no administration in time range)    Or  ondansetron (ZOFRAN) injection 4 mg (has no administration in time range)  morphine (PF) 2 MG/ML injection 2 mg (has no administration in time range)  iohexol (OMNIPAQUE) 350 MG/ML injection 75 mL (75 mLs Intravenous Contrast Given 07/11/22 1845)  aspirin chewable tablet 324 mg (324 mg Oral Given 07/11/22 2006)  heparin bolus via infusion 3,200 Units (3,200 Units Intravenous Bolus from Bag 07/11/22 2019)  furosemide (LASIX) injection 40 mg (40 mg Intravenous Given 07/11/22 2329)     IMPRESSION / MDM / ASSESSMENT AND PLAN  / ED COURSE  I reviewed the triage vital signs and the nursing notes.   Differential diagnosis includes, but is not limited to, angina or NSTEMI or other 2 main diagnoses to consider at this point.  Prior to getting the second troponin back there was a possibility of esophageal spasm or nerve injury or pericarditis etc.  Patient's presentation is most consistent with acute presentation with potential threat to life or bodily function.  The patient is on the cardiac monitor to evaluate for evidence of arrhythmia and/or significant heart rate changes.  None have been seen      FINAL  CLINICAL IMPRESSION(S) / ED DIAGNOSES   Final diagnoses:  NSTEMI (non-ST elevated myocardial infarction) (HCC)     Rx / DC Orders   ED Discharge Orders     None        Note:  This document was prepared using Dragon voice recognition software and may include unintentional dictation errors.   Arnaldo Natal, MD 07/12/22 [phone]

## 2022-07-11 NOTE — Assessment & Plan Note (Signed)
Sliding scale insulin coverage Will hold glipizide and metformin

## 2022-07-11 NOTE — Assessment & Plan Note (Signed)
BP controlled.  Continue carvedilol and diltiazem

## 2022-07-11 NOTE — Consult Note (Signed)
ANTICOAGULATION CONSULT NOTE - Initial Consult  Pharmacy Consult for Heparin Indication: chest pain/ACS  Allergies  Allergen Reactions   Blueberry Flavor Hives and Shortness Of Breath   Sulfa Antibiotics Itching   Sulfonamide Derivatives Itching   Other Swelling and Rash    METAL    Patient Measurements: Height: 5' (152.4 cm) Weight: 79.6 kg (175 lb 6.4 oz) IBW/kg (Calculated) : 45.5 Heparin Dosing Weight: 63.7 kg  Vital Signs: Temp: 99 F (37.2 C) (11/10 1701) Temp Source: Oral (11/10 1701) BP: 123/105 (11/10 1830) Pulse Rate: 73 (11/10 1830)  Labs: Recent Labs    07/11/22 1707 07/11/22 1922  HGB 13.1  --   HCT 40.2  --   PLT 226  --   CREATININE 0.55  --   TROPONINIHS 86* 259*    Estimated Creatinine Clearance: 51.5 mL/min (by C-G formula based on SCr of 0.55 mg/dL).   Medical History: Past Medical History:  Diagnosis Date   Arthritis 07/22/2017   Cerebellar stroke (HCC)    Diabetes mellitus type 2 in nonobese (HCC)    DM (diabetes mellitus) (HCC)    Dysarthria    Essential hypertension 07/22/2017   Hyperlipidemia, unspecified 07/22/2017   OA (osteoarthritis) of knee    right   Obesity    Stroke (HCC)    Tachycardia 07/22/2017   VENTRICULAR TACHYCARDIA 09/05/2009   Qualifier: Diagnosis of  By: Susette Racer CMA, Jewel     Ventricular tachycardia (HCC)    Vitamin B 12 deficiency     Medications:  (Not in a hospital admission)  Scheduled:   heparin  60 Units/kg Intravenous Once   Infusions:   heparin     PRN:  Anti-infectives (From admission, onward)    None       Assessment: Pharmacy consulted to start heparin infusion for ACS. NO DOAC PTA. CBC stable. Trop elevated to 259.   Goal of Therapy:  Heparin level 0.3-0.7 units/ml Monitor platelets by anticoagulation protocol: Yes   Plan:  Give 3200 units bolus x 1 Start heparin infusion at 750 units/hr Check anti-Xa level in 8 hours and daily while on heparin Continue to monitor H&H and  platelets  Ronnald Ramp, PharmD, BCPS 07/11/2022,8:09 PM

## 2022-07-11 NOTE — Assessment & Plan Note (Signed)
Continue flecainide Had removal of an implantable loop recorder in February 2023 by Dr. Sharrell Ku.

## 2022-07-11 NOTE — ED Triage Notes (Signed)
Pt to ED from home via [facility] right arm pain, back pain, and SOB. Pt reports burping and denies N/V. Pt has a hx of dementia, stroke, and COPD.

## 2022-07-11 NOTE — Assessment & Plan Note (Signed)
Continue aspirin and rosuvastatin ?

## 2022-07-11 NOTE — Assessment & Plan Note (Signed)
Continue memantine °

## 2022-07-11 NOTE — Assessment & Plan Note (Addendum)
Secondary to CHF exacerbation CTA chest negative for PE.  Did show possibility of viral/atypical pneumonia but patient has had no cough, fever or chills so low suspicion for infectious etiology Patient is tachypneic and requiring 4 L to maintain sats in the mid 90s Continue supplemental oxygen and wean as tolerated

## 2022-07-11 NOTE — H&P (Addendum)
History and Physical    Patient: Yvonne Newton BPZ:[id number] DOB: Jul 04, 1941 DOA: 07/11/2022 DOS: the patient was seen and examined on 07/11/2022 PCP: Nira Retort  Patient coming from: Home  Chief Complaint:  Chief Complaint  Patient presents with   Back Pain   Shortness of Breath    HPI: Yvonne Newton is a 81 y.o. female with medical history significant for NIDDM, HTN, G1 DD on echo 2019, with no history of CAD, cerebellar stroke in 2019, mild dementia and history of ventricular tachycardia with removal of implantable loop recorder 10/2021 with no events, currently on flecainide, who was brought to the ED by EMS after the onset of bilateral arm pain, upper back pain and chest pain starting while walking outside.  She had associated diaphoresis, shortness of breath and nausea.  History is taken from part from daughter over the phone who states that at baseline patient has chronic dyspnea with lower extremity edema and most nights gets up in the middle of the night to go sleep in a recliner.  She is not certain if this is for comfort or for shortness of breath.  She has had no cough, fever or chills.  She states her mother was previously on a fluid pill but no longer takes it.  Patient reports relief in her chest pain but continues to indicate lower anterior and mid back pain encircling her chest ED course and data review: On arrival, temp 99, BP 147/74 with pulse 76 and respirations 23 with O2 sat 97% on room air.  Labs with WBC 12,900 with lactic acid 1.8 and COVID and flu pending.  Troponin 80> 259.  D-dimer 0.55.  CMP unremarkable.  COVID and flu pending.  EKG, personally viewed and interpreted showing sinus at 74 with LAFB and T wave inversions lateral leads.  CTA chest was negative for PE but concerning for possible atypical pneumonia as outlined below: MPRESSION: 1. No evidence of pulmonary embolus. 2. Bibasilar bronchial wall thickening with faint  scattered ground-glass airspace disease, compatible with atypical infection such as viral etiology. 3. Cardiomegaly. 4. Aortic Atherosclerosis (ICD10-I70.0). Coronary artery atherosclerosis. 5. Subtle nodularity of the liver capsule suggesting underlying cirrhosis.  Patient was given chewable aspirin 324 mg and started on a heparin infusion.  Hospitalist consulted for admission.   Following arrival to the floor, patient developed worsening shortness of breath albeit without chest pain and developed tachypnea and was orthopneic.  She required 4 L to maintain sats in the mid 90s.  Review of Systems: As mentioned in the history of present illness. All other systems reviewed and are negative.  Past Medical History:  Diagnosis Date   Arthritis 07/22/2017   Cerebellar stroke (HCC)    Diabetes mellitus type 2 in nonobese (HCC)    DM (diabetes mellitus) (HCC)    Dysarthria    Essential hypertension 07/22/2017   Hyperlipidemia, unspecified 07/22/2017   OA (osteoarthritis) of knee    right   Obesity    Stroke (HCC)    Tachycardia 07/22/2017   VENTRICULAR TACHYCARDIA 09/05/2009   Qualifier: Diagnosis of  By: Susette Racer CMA, Jewel     Ventricular tachycardia (HCC)    Vitamin B 12 deficiency    Past Surgical History:  Procedure Laterality Date   BREAST BIOPSY Left 2010   Negative   LOOP RECORDER INSERTION N/A 11/03/2017   Procedure: LOOP RECORDER INSERTION;  Surgeon: Regan Lemming, MD;  Location: [facility]VASIVE [facility];  Service: Cardiovascular;  Laterality: N/A;   TEE  WITHOUT CARDIOVERSION N/A 11/03/2017   Procedure: TRANSESOPHAGEAL ECHOCARDIOGRAM (TEE);  Surgeon: Dorothy Spark, MD;  Location: [facility] ENDOSCOPY;  Service: Cardiovascular;  Laterality: N/A;   Social History:  reports that she quit smoking about 23 years ago. She has never used smokeless tobacco. She reports that she does not drink alcohol and does not use drugs.  Allergies  Allergen Reactions   Blueberry Flavor Hives and  Shortness Of Breath   Sulfa Antibiotics Itching   Sulfonamide Derivatives Itching   Other Swelling and Rash    METAL    Family History  Problem Relation Age of Onset   Heart attack Father 55   Heart attack Brother 45   Heart attack Brother        had CABG   Breast cancer Neg Hx     Prior to Admission medications   Medication Sig Start Date End Date Taking? Authorizing Provider  glipiZIDE (GLUCOTROL XL) 2.5 MG 24 hr tablet Take 2.5 mg by mouth daily with breakfast. 06/03/22 06/03/23 Yes [provider]  memantine (NAMENDA TITRATION PACK) tablet pack Take 1 tablet by mouth in the morning and at bedtime. 04/17/22 04/17/23 Yes [provider]  aspirin EC 81 MG EC tablet Take 1 tablet (81 mg total) by mouth daily. 11/05/17   Debbe Odea, MD  carvedilol (COREG) 12.5 MG tablet TAKE 1 TABLET BY MOUTH TWICE A DAY 12/02/21   Evans Lance, MD  cetirizine (ZYRTEC) 10 MG tablet Take 10 mg by mouth daily. 04/14/18   [provider]  CYANOCOBALAMIN IJ Inject as directed every 30 (thirty) days. On or about the 26th of each month    [provider]  diltiazem (CARDIZEM CD) 120 MG 24 hr capsule TAKE 1 CAPSULE BY MOUTH EVERY DAY 11/18/21   Evans Lance, MD  EPINEPHrine 0.3 mg/0.3 mL IJ SOAJ injection Inject 0.3 mg into the muscle once as needed (severe allergic reaction).    [provider]  flecainide (TAMBOCOR) 50 MG tablet TAKE 1 TABLET (50 MG TOTAL) BY MOUTH 2 (TWO) TIMES DAILY. 02/05/12   Evans Lance, MD  metFORMIN (GLUCOPHAGE-XR) 500 MG 24 hr tablet Take 500-1,000 mg by mouth See admin instructions. Take one tablet (500 mg) by mouth every morning and two tablets (1000 mg) at bedtime 01/25/16   [provider]  omeprazole (PRILOSEC) 20 MG capsule Take 20 mg by mouth daily.     [provider]  rosuvastatin (CRESTOR) 5 MG tablet Take 1 tablet (5 mg total) by mouth daily at 6 PM. 01/19/18   Frann Rider, NP  Vitamin D, Ergocalciferol,  (DRISDOL) 50000 units CAPS capsule Take 50,000 Units by mouth every 7 (seven) days.    [provider]    Physical Exam: Vitals:   07/11/22 1730 07/11/22 1800 07/11/22 1830 07/11/22 2008  BP: 131/68 133/68 (!) 123/105   Pulse: 73 72 73   Resp: (!) 21 18 (!) 22   Temp:      TempSrc:      SpO2: 100% 99% 100%   Weight:    79.6 kg  Height:    5' (1.524 m)   Physical Exam Vitals and nursing note reviewed.  Constitutional:      General: She is not in acute distress.    Interventions: Nasal cannula in place.     Comments: Patient sitting upright on bed, tachypneic.  Nasal cannula on conversational dyspnea.  Speaking in short sentences  HENT:     Head: Normocephalic and  atraumatic.  Cardiovascular:     Rate and Rhythm: Normal rate and regular rhythm.     Heart sounds: Normal heart sounds.  Pulmonary:     Effort: Tachypnea present.     Breath sounds: Wheezing and rales present.  Abdominal:     Palpations: Abdomen is soft.     Tenderness: There is no abdominal tenderness.  Musculoskeletal:     Right lower leg: Edema present.     Left lower leg: Edema present.  Neurological:     Mental Status: Mental status is at baseline.   She I your relative all okay okay  Labs on Admission: I have personally reviewed following labs and imaging studies  CBC: Recent Labs  Lab 07/11/22 1707  WBC 12.9*  NEUTROABS 7.5  HGB 13.1  HCT 40.2  MCV 87.0  PLT [id number]   Basic Metabolic Panel: Recent Labs  Lab 07/11/22 1707  NA 139  K 4.3  CL 106  CO2 25  GLUCOSE 110*  BUN 20  CREATININE 0.55  CALCIUM 9.4   GFR: Estimated Creatinine Clearance: 51.5 mL/min (by C-G formula based on SCr of 0.55 mg/dL). Liver Function Tests: Recent Labs  Lab 07/11/22 1707  AST 24  ALT 18  ALKPHOS 50  BILITOT 1.2  PROT 7.3  ALBUMIN 4.1   No results for input(s): "LIPASE", "AMYLASE" in the last 168 hours. No results for input(s): "AMMONIA" in the last 168 hours. Coagulation Profile: No  results for input(s): "INR", "PROTIME" in the last 168 hours. Cardiac Enzymes: No results for input(s): "CKTOTAL", "CKMB", "CKMBINDEX", "TROPONINI" in the last 168 hours. BNP (last 3 results) No results for input(s): "PROBNP" in the last 8760 hours. HbA1C: No results for input(s): "HGBA1C" in the last 72 hours. CBG: No results for input(s): "GLUCAP" in the last 168 hours. Lipid Profile: No results for input(s): "CHOL", "HDL", "LDLCALC", "TRIG", "CHOLHDL", "LDLDIRECT" in the last 72 hours. Thyroid Function Tests: No results for input(s): "TSH", "T4TOTAL", "FREET4", "T3FREE", "THYROIDAB" in the last 72 hours. Anemia Panel: No results for input(s): "VITAMINB12", "FOLATE", "FERRITIN", "TIBC", "IRON", "RETICCTPCT" in the last 72 hours. Urine analysis:    Component Value Date/Time   COLORURINE YELLOW 05/07/2022 1641   APPEARANCEUR CLEAR 05/07/2022 1641   LABSPEC 1.015 05/07/2022 1641   PHURINE 7.0 05/07/2022 1641   GLUCOSEU NEGATIVE 05/07/2022 1641   HGBUR NEGATIVE 05/07/2022 1641   BILIRUBINUR NEGATIVE 05/07/2022 1641   KETONESUR NEGATIVE 05/07/2022 1641   PROTEINUR NEGATIVE 05/07/2022 1641   NITRITE NEGATIVE 05/07/2022 1641   LEUKOCYTESUR NEGATIVE 05/07/2022 1641    Radiological Exams on Admission: CT Angio Chest PE W and/or Wo Contrast  Result Date: 07/11/2022 CLINICAL DATA:  Right arm pain, back pain, short of breath, positive D-dimer EXAM: CT ANGIOGRAPHY CHEST WITH CONTRAST TECHNIQUE: Multidetector CT imaging of the chest was performed using the standard protocol during bolus administration of intravenous contrast. Multiplanar CT image reconstructions and MIPs were obtained to evaluate the vascular anatomy. RADIATION DOSE REDUCTION: This exam was performed according to the departmental dose-optimization program which includes automated exposure control, adjustment of the mA and/or kV according to patient size and/or use of iterative reconstruction technique. CONTRAST:  44mL  OMNIPAQUE IOHEXOL 350 MG/ML SOLN COMPARISON:  07/11/2022 FINDINGS: Cardiovascular: This is a technically adequate evaluation of the pulmonary vasculature. No filling defects or pulmonary emboli. Mild cardiomegaly without pericardial effusion. Normal caliber of the thoracic aorta. Atherosclerosis of the aorta and coronary vasculature. Mediastinum/Nodes: No enlarged mediastinal, hilar, or axillary lymph nodes. Thyroid gland, trachea,  and esophagus demonstrate no significant findings. Lungs/Pleura: There are scattered faint ground-glass opacity seen within the bilateral lower lobes, with associated bibasilar bronchial wall thickening, consistent with atypical pneumonia such as viral etiology. No effusion or pneumothorax. Upper Abdomen: Subtle nodularity of the liver capsule compatible with cirrhosis. No acute upper abdominal finding. Musculoskeletal: No acute or destructive bony lesions. Reconstructed images demonstrate no additional findings. Review of the MIP images confirms the above findings. IMPRESSION: 1. No evidence of pulmonary embolus. 2. Bibasilar bronchial wall thickening with faint scattered ground-glass airspace disease, compatible with atypical infection such as viral etiology. 3. Cardiomegaly. 4. Aortic Atherosclerosis (ICD10-I70.0). Coronary artery atherosclerosis. 5. Subtle nodularity of the liver capsule suggesting underlying cirrhosis. Electronically Signed   By: Randa Ngo M.D.   On: 07/11/2022 19:01   DG Chest Portable 1 View  Result Date: 07/11/2022 CLINICAL DATA:  Shortness of breath EXAM: PORTABLE CHEST 1 VIEW COMPARISON:  05/07/2022 FINDINGS: Cardiomegaly with vascular congestion and mild diffuse interstitial opacity likely due to mild edema. No sizable effusion. Aortic atherosclerosis. IMPRESSION: Cardiomegaly with vascular congestion and mild diffuse interstitial opacity likely due to mild edema. Electronically Signed   By: Donavan Foil M.D.   On: 07/11/2022 17:28     Data  Reviewed: Relevant notes from primary care and specialist visits, past discharge summaries as available in EHR, including Care Everywhere. Prior diagnostic testing as pertinent to current admission diagnoses Updated medications and problem lists for reconciliation ED course, including vitals, labs, imaging, treatment and response to treatment Triage notes, nursing and pharmacy notes and ED provider's notes Notable results as noted in HPI   Assessment and Plan: * NSTEMI (non-ST elevated myocardial infarction) [facility]) Patient presents with exertional chest pain, typical.  EKG with lateral T wave inversions and troponin bump 80-259 Suspecting type I NSTEMI but could be type II related to CHF exacerbation Received aspirin 324 mg in the ED Continue heparin infusion Continue home aspirin, Coreg, rosuvastatin Nitroglycerin sublingual as needed chest pain with morphine for breakthrough Cardiology consult Get echocardiogram in the a.m.  Acute on chronic diastolic CHF (congestive heart failure) ([place]) Patient with lower extremity edema shortness of breath, worsening since arrival to the hospital associated with orthopnea Suspect triggered by NSTEMI or may be primary etiology Continuous cardiac monitoring to eval.  For arrhythmias in view of history of ventricular tachycardia  follow-up BNP IV Lasix and continue home Coreg Daily weights with intake and output monitoring Echocardiogram in the a.m. Cardiology consult  Acute respiratory failure with hypoxia ([place]) Secondary to CHF exacerbation CTA chest negative for PE.  Did show possibility of viral/atypical pneumonia but patient has had no cough, fever or chills so low suspicion for infectious etiology Patient is tachypneic and requiring 4 L to maintain sats in the mid 90s Continue supplemental oxygen and wean as tolerated  Chest pain Likely secondary to NSTEMI as outlined above CTA chest negative for PE Some concern for viral infection given  leukocytosis, CTA findings of possible atypical infection, however no clinical correlation Continue to monitor for additional signs and symptoms to suggest a viral infection Follow-up COVID and flu  Dementia without behavioral disturbance (HCC) Continue memantine  History of ventricular tachycardia Continue flecainide Had removal of an implantable loop recorder in February 2023 by Dr. Crissie Sickles.  History of cerebellar stroke 2019 Continue aspirin and rosuvastatin  Essential hypertension BP controlled.  Continue carvedilol and diltiazem  Diabetes mellitus type 2, uncomplicated (HCC) Sliding scale insulin coverage Will hold glipizide and metformin  Working  DVT prophylaxis: Lovenox  Consults: [facility], Dr. Davina Poke  Advance Care Planning:   Code Status: Prior   Family Communication: Daughter, Dayla Frappier  Disposition Plan: Back to previous home environment  Severity of Illness: The appropriate patient status for this patient is INPATIENT. Inpatient status is judged to be reasonable and necessary in order to provide the required intensity of service to ensure the patient's safety. The patient's presenting symptoms, physical exam findings, and initial radiographic and laboratory data in the context of their chronic comorbidities is felt to place them at high risk for further clinical deterioration. Furthermore, it is not anticipated that the patient will be medically stable for discharge from the hospital within 2 midnights of admission.   * I certify that at the point of admission it is my clinical judgment that the patient will require inpatient hospital care spanning beyond 2 midnights from the point of admission due to high intensity of service, high risk for further deterioration and high frequency of surveillance required.*  Author: Athena Masse, MD 07/11/2022 8:29 PM  For on call review [email or web address].

## 2022-07-12 DIAGNOSIS — I25110 Atherosclerotic heart disease of native coronary artery with unstable angina pectoris: Secondary | ICD-10-CM

## 2022-07-12 DIAGNOSIS — I10 Essential (primary) hypertension: Secondary | ICD-10-CM

## 2022-07-12 DIAGNOSIS — E118 Type 2 diabetes mellitus with unspecified complications: Secondary | ICD-10-CM

## 2022-07-12 DIAGNOSIS — I472 Ventricular tachycardia, unspecified: Secondary | ICD-10-CM

## 2022-07-12 DIAGNOSIS — I214 Non-ST elevation (NSTEMI) myocardial infarction: Secondary | ICD-10-CM | POA: Diagnosis not present

## 2022-07-12 LAB — CBC
HCT: 40.1 % (ref 36.0–46.0)
Hemoglobin: 13 g/dL (ref 12.0–15.0)
MCH: 28 pg (ref 26.0–34.0)
MCHC: 32.4 g/dL (ref 30.0–36.0)
MCV: 86.4 fL (ref 80.0–100.0)
Platelets: 220 10*3/uL (ref 150–400)
RBC: 4.64 MIL/uL (ref 3.87–5.11)
RDW: 13.5 % (ref 11.5–15.5)
WBC: 14.7 10*3/uL — ABNORMAL HIGH (ref 4.0–10.5)
nRBC: 0 % (ref 0.0–0.2)

## 2022-07-12 LAB — URINALYSIS, COMPLETE (UACMP) WITH MICROSCOPIC
Bilirubin Urine: NEGATIVE
Glucose, UA: NEGATIVE mg/dL
Hgb urine dipstick: NEGATIVE
Ketones, ur: NEGATIVE mg/dL
Leukocytes,Ua: NEGATIVE
Nitrite: NEGATIVE
Protein, ur: NEGATIVE mg/dL
Specific Gravity, Urine: 1.016 (ref 1.005–1.030)
pH: 5 (ref 5.0–8.0)

## 2022-07-12 LAB — HEPARIN LEVEL (UNFRACTIONATED)
Heparin Unfractionated: 0.15 IU/mL — ABNORMAL LOW (ref 0.30–0.70)
Heparin Unfractionated: 0.29 IU/mL — ABNORMAL LOW (ref 0.30–0.70)

## 2022-07-12 LAB — HEMOGLOBIN A1C
Hgb A1c MFr Bld: 6.3 % — ABNORMAL HIGH (ref 4.8–5.6)
Mean Plasma Glucose: 134.11 mg/dL

## 2022-07-12 LAB — GLUCOSE, CAPILLARY
Glucose-Capillary: 147 mg/dL — ABNORMAL HIGH (ref 70–99)
Glucose-Capillary: 124 mg/dL — ABNORMAL HIGH (ref 70–99)
Glucose-Capillary: 174 mg/dL — ABNORMAL HIGH (ref 70–99)
Glucose-Capillary: 154 mg/dL — ABNORMAL HIGH (ref 70–99)
Glucose-Capillary: 136 mg/dL — ABNORMAL HIGH (ref 70–99)

## 2022-07-12 LAB — TROPONIN I (HIGH SENSITIVITY): Troponin I (High Sensitivity): 494 ng/L (ref <18)

## 2022-07-12 MED ORDER — HEPARIN BOLUS VIA INFUSION
950.0000 [IU] | Freq: Once | INTRAVENOUS | Status: AC
  Administered 2022-07-12: 950 [IU] via INTRAVENOUS
  Filled 2022-07-12: qty 950

## 2022-07-12 MED ORDER — ALUM & MAG HYDROXIDE-SIMETH 200-200-20 MG/5ML PO SUSP
30.0000 mL | Freq: Once | ORAL | Status: AC
  Administered 2022-07-12: 30 mL via ORAL
  Filled 2022-07-12: qty 30

## 2022-07-12 MED ORDER — HEPARIN BOLUS VIA INFUSION
950.0000 [IU] | Freq: Once | INTRAVENOUS | Status: DC
  Filled 2022-07-12: qty 950

## 2022-07-12 MED ORDER — FUROSEMIDE 10 MG/ML IJ SOLN
40.0000 mg | Freq: Every day | INTRAMUSCULAR | Status: DC
  Administered 2022-07-12 – 2022-07-13 (×2): 40 mg via INTRAVENOUS
  Filled 2022-07-12 (×2): qty 4

## 2022-07-12 MED ORDER — DILTIAZEM HCL ER COATED BEADS 120 MG PO CP24
120.0000 mg | ORAL_CAPSULE | ORAL | Status: DC
  Administered 2022-07-12 – 2022-07-14 (×2): 120 mg via ORAL
  Filled 2022-07-12 (×2): qty 1

## 2022-07-12 MED ORDER — HEPARIN BOLUS VIA INFUSION
1900.0000 [IU] | Freq: Once | INTRAVENOUS | Status: AC
  Administered 2022-07-12: 1900 [IU] via INTRAVENOUS
  Filled 2022-07-12: qty 1900

## 2022-07-12 MED ORDER — LORATADINE 10 MG PO TABS
10.0000 mg | ORAL_TABLET | Freq: Every day | ORAL | Status: DC
  Administered 2022-07-12 – 2022-07-15 (×3): 10 mg via ORAL
  Filled 2022-07-12 (×3): qty 1

## 2022-07-12 NOTE — Consult Note (Signed)
Cardiology Consultation   Patient ID: Marin Wisner MRN: [id number]; DOB: 06-21-1941  Admit date: 07/11/2022 Date of Consult: 07/12/2022  PCP:  Nira Retort   [facility] Providers Cardiologist:  None  Electrophysiologist:  Lewayne Bunting, MD  {  Patient Profile:   Zamia Tyminski is a 81 y.o. 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 Ashok Pall.  History of Present Illness:   Ms. Gotham is followed by Dr. Ladona Ridgel. 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 She had a left superior cerebellar infarct w/ petechial hemorrhage, in the setting of VT. Infarct likely embolic secondary to unknown source. Echo at that time showed KVEF 65-70%, G1DD, mild AI, trivial TR, trivla pericardial effusion, severe  non-mobile atheroma, PFO present. An ILR was implanted to assess for possible Afib.  ILR was removed in 10/2021.   The patient presented to [facility] ED 11/10 for back pain and shortness of breath. She was baking when she started feeling bilateral arm pain. Then she noted pain into her back and upper chest. She had nausea as well. Said it lasted a while. Her children with with her at the time.   In the ER BP137/57, pulse 76bpm, 98% O2, RR 20. Labs showed WBC 12.9, ddimer 0.55, BNP 119. HS trop [id number]. Cxr showed cardiomegaly with some vascular congestion. Chest CTA showed no PE, possible atypical infection, aortic atherosclerosis and coronary artery atherosclerosis. EKG showed NSR with no significant changes.She was given ASA 324mg .The patient was placed on IV heparin and admitted for further work-up.   Past Medical History:  Diagnosis Date   Arthritis 07/22/2017   Cerebellar stroke (HCC)    Diabetes mellitus type 2 in nonobese (HCC)    DM  (diabetes mellitus) (HCC)    Dysarthria    Essential hypertension 07/22/2017   Hyperlipidemia, unspecified 07/22/2017   OA (osteoarthritis) of knee    right   Obesity    Stroke (HCC)    Tachycardia 07/22/2017   VENTRICULAR TACHYCARDIA 09/05/2009   Qualifier: Diagnosis of  By: 11/03/2009 CMA, Jewel     Ventricular tachycardia (HCC)    Vitamin B 12 deficiency     Past Surgical History:  Procedure Laterality Date   BREAST BIOPSY Left 2010   Negative   LOOP RECORDER INSERTION N/A 11/03/2017   Procedure: LOOP RECORDER INSERTION;  Surgeon: 01/03/2018, MD;  Location: [facility] INVASIVE CV LAB;  Service: Cardiovascular;  Laterality: N/A;   TEE WITHOUT CARDIOVERSION N/A 11/03/2017   Procedure: TRANSESOPHAGEAL ECHOCARDIOGRAM (TEE);  Surgeon: 01/03/2018, MD;  Location: [facility] ENDOSCOPY;  Service: Cardiovascular;  Laterality: N/A;     Home Medications:  Prior to Admission medications   Medication Sig Start Date End Date Taking? Authorizing Provider  aspirin EC 81 MG EC tablet Take 1 tablet (81 mg total) by mouth daily. 11/05/17  Yes 01/05/18, MD  carvedilol (COREG) 12.5 MG tablet TAKE 1 TABLET BY MOUTH TWICE A DAY 12/02/21  Yes 02/01/22, MD  cetirizine (ZYRTEC) 10 MG tablet Take 10 mg by mouth daily. 04/14/18  Yes [provider]  CYANOCOBALAMIN IJ Inject as directed every 30 (thirty) days. On or about the 26th of each month   Yes [provider]  diltiazem (CARDIZEM CD) 120 MG 24 hr capsule TAKE  1 CAPSULE BY MOUTH EVERY DAY 11/18/21  Yes Marinus Mawaylor, Gregg W, MD  flecainide (TAMBOCOR) 50 MG tablet TAKE 1 TABLET (50 MG TOTAL) BY MOUTH 2 (TWO) TIMES DAILY. 02/05/12  Yes Marinus Mawaylor, Gregg W, MD  glipiZIDE (GLUCOTROL XL) 2.5 MG 24 hr tablet Take 2.5 mg by mouth daily with breakfast. 06/03/22 06/03/23 Yes [provider]  memantine (NAMENDA TITRATION PACK) tablet pack Take 1 tablet by mouth in the morning and at bedtime. 04/17/22 04/17/23 Yes [provider]  metFORMIN  (GLUCOPHAGE-XR) 500 MG 24 hr tablet Take 500-1,000 mg by mouth See admin instructions. Take one tablet (500 mg) by mouth every morning and two tablets (1000 mg) at bedtime 01/25/16  Yes [provider]  omeprazole (PRILOSEC) 20 MG capsule Take 20 mg by mouth daily.    Yes [provider]  rosuvastatin (CRESTOR) 5 MG tablet Take 1 tablet (5 mg total) by mouth daily at 6 PM. 01/19/18  Yes McCue, Shanda BumpsJessica, NP  EPINEPHrine 0.3 mg/0.3 mL IJ SOAJ injection Inject 0.3 mg into the muscle once as needed (severe allergic reaction).    [provider]  Vitamin D, Ergocalciferol, (DRISDOL) 50000 units CAPS capsule Take 50,000 Units by mouth every 7 (seven) days. Patient not taking: Reported on 07/11/2022    [provider]    Inpatient Medications: Scheduled Meds:  aspirin EC  81 mg Oral Daily   carvedilol  12.5 mg Oral BID   diltiazem  120 mg Oral Q48H   flecainide  50 mg Oral BID   furosemide  40 mg Intravenous Daily   insulin aspart  0-15 Units Subcutaneous Q4H   loratadine  10 mg Oral Daily   memantine  10 mg Oral BID   pantoprazole  40 mg Oral Daily   rosuvastatin  5 mg Oral q1800   Continuous Infusions:  sodium chloride 10 mL/hr at 07/12/22 0818   heparin 1,000 Units/hr (07/12/22 0637)   PRN Meds: sodium chloride, acetaminophen, nitroGLYCERIN, ondansetron **OR** ondansetron (ZOFRAN) IV  Allergies:    Allergies  Allergen Reactions   Blueberry Flavor Hives and Shortness Of Breath   Sulfa Antibiotics Itching   Sulfonamide Derivatives Itching   Other Swelling and Rash    METAL    Social History:   Social History   Socioeconomic History   Marital status: Married    Spouse name: Not on file   Number of children: Not on file   Years of education: Not on file   Highest education level: Not on file  Occupational History   Not on file  Tobacco Use   Smoking status: Former    Types: Cigarettes    Quit date: 09/01/1998    Years since quitting: 23.8    Smokeless tobacco: Never  Vaping Use   Vaping Use: Never used  Substance and Sexual Activity   Alcohol use: No   Drug use: No   Sexual activity: Not on file  Other Topics Concern   Not on file  Social History Narrative   Not on file   Social Determinants of Health   Financial Resource Strain: Not on file  Food Insecurity: Not on file  Transportation Needs: Not on file  Physical Activity: Not on file  Stress: Not on file  Social Connections: Not on file  Intimate Partner Violence: Not on file    Family History:    Family History  Problem Relation Age of Onset   Heart attack Father 6965   Heart attack Brother 1048  Heart attack Brother        had CABG   Breast cancer Neg Hx      ROS:  Please see the history of present illness.   All other ROS reviewed and negative.     Physical Exam/Data:   Vitals:   07/12/22 0400 07/12/22 0408 07/12/22 0500 07/12/22 0801  BP: (!) 148/60   (!) 108/56  Pulse: 82   75  Resp: (!) 23   18  Temp:  97.6 F (36.4 C)  98.1 F (36.7 C)  TempSrc:  Oral  Oral  SpO2: 99%   99%  Weight:   78.8 kg   Height:   5' (1.524 m)     Intake/Output Summary (Last 24 hours) at 07/12/2022 0938 Last data filed at 07/12/2022 0818 Gross per 24 hour  Intake 177 ml  Output 300 ml  Net -123 ml      07/12/2022    5:00 AM 07/11/2022    8:08 PM 05/07/2022    2:33 PM  Last 3 Weights  Weight (lbs) 173 lb 12.8 oz 175 lb 6.4 oz 172 lb  Weight (kg) 78.835 kg 79.561 kg 78.019 kg     Body mass index is 33.94 kg/m.  General:  Well nourished, well developed, in no acute distress HEENT: normal Neck: no JVD Vascular: No carotid bruits; Distal pulses 2+ bilaterally Cardiac:  normal S1, S2; RRR; no murmur  Lungs:  clear to auscultation bilaterally, no wheezing, rhonchi or rales  Abd: soft, nontender, no hepatomegaly  Ext: no edema Musculoskeletal:  No deformities, BUE and BLE strength normal and equal Skin: warm and dry  Neuro:  CNs 2-12 intact, no focal  abnormalities noted Psych:  Normal affect   EKG:  The EKG was personally reviewed and demonstrates:  NSR, 72bpm, LAFB, no significant ischemic changes Telemetry:  Telemetry was personally reviewed and demonstrates:  NSR HR 70s  Relevant CV Studies:  Echo TEE 11/03/21 Study Conclusions   - Left ventricle: Wall thickness was increased in a pattern of    moderate LVH. Systolic function was normal. The estimated    ejection fraction was in the range of 60% to 65%. Wall motion was    normal; there were no regional wall motion abnormalities.  - Aortic valve: No evidence of vegetation. There was mild    regurgitation.  - Aorta: There was severe non-mobile atheroma.  - Descending aorta: The descending aorta was normal in size.  - Mitral valve: There was mild regurgitation.  - Left atrium: No evidence of thrombus in the atrial cavity or    appendage. No evidence of thrombus in the atrial cavity or    appendage.  - Right atrium: No evidence of thrombus in the atrial cavity or    appendage.  - Atrial septum: There was a patent foramen ovale.   Impressions:   - Positive bubble study, PFO present.   Echo 11/02/21 Study Conclusions   - Left ventricle: The cavity size was normal. There was moderate    concentric hypertrophy. Systolic function was vigorous. The    estimated ejection fraction was in the range of 65% to 70%. Wall    motion was normal; there were no regional wall motion    abnormalities. There was an increased relative contribution of    atrial contraction to ventricular filling. Doppler parameters are    consistent with abnormal left ventricular relaxation (grade 1    diastolic dysfunction).  - Aortic valve: There was mild regurgitation.  -  Mitral valve: Valve area by pressure half-time: 2.5 cm^2.  - Tricuspid valve: There was trivial regurgitation.  - Pericardium, extracardiac: A trivial, free-flowing pericardial    effusion was identified along the right ventricular free  wall.     Laboratory Data:  High Sensitivity Troponin:   Recent Labs  Lab 07/11/22 1707 07/11/22 1922 07/11/22 2300 07/12/22 0045  TROPONINIHS 86* 259* 459* 494*     Chemistry Recent Labs  Lab 07/11/22 1707  NA 139  K 4.3  CL 106  CO2 25  GLUCOSE 110*  BUN 20  CREATININE 0.55  CALCIUM 9.4  GFRNONAA >60  ANIONGAP 8    Recent Labs  Lab 07/11/22 1707  PROT 7.3  ALBUMIN 4.1  AST 24  ALT 18  ALKPHOS 50  BILITOT 1.2   Lipids No results for input(s): "CHOL", "TRIG", "HDL", "LABVLDL", "LDLCALC", "CHOLHDL" in the last 168 hours.  Hematology Recent Labs  Lab 07/11/22 1707 07/12/22 0442  WBC 12.9* 14.7*  RBC 4.62 4.64  HGB 13.1 13.0  HCT 40.2 40.1  MCV 87.0 86.4  MCH 28.4 28.0  MCHC 32.6 32.4  RDW 13.6 13.5  PLT 226 220   Thyroid No results for input(s): "TSH", "FREET4" in the last 168 hours.  BNP Recent Labs  Lab 07/11/22 1701 07/11/22 2300  BNP 55.7 119.9*    DDimer  Recent Labs  Lab 07/11/22 1707  DDIMER 0.55*     Radiology/Studies:  CT Angio Chest PE W and/or Wo Contrast  Result Date: 07/11/2022 CLINICAL DATA:  Right arm pain, back pain, short of breath, positive D-dimer EXAM: CT ANGIOGRAPHY CHEST WITH CONTRAST TECHNIQUE: Multidetector CT imaging of the chest was performed using the standard protocol during bolus administration of intravenous contrast. Multiplanar CT image reconstructions and MIPs were obtained to evaluate the vascular anatomy. RADIATION DOSE REDUCTION: This exam was performed according to the departmental dose-optimization program which includes automated exposure control, adjustment of the mA and/or kV according to patient size and/or use of iterative reconstruction technique. CONTRAST:  75mL OMNIPAQUE IOHEXOL 350 MG/ML SOLN COMPARISON:  07/11/2022 FINDINGS: Cardiovascular: This is a technically adequate evaluation of the pulmonary vasculature. No filling defects or pulmonary emboli. Mild cardiomegaly without pericardial  effusion. Normal caliber of the thoracic aorta. Atherosclerosis of the aorta and coronary vasculature. Mediastinum/Nodes: No enlarged mediastinal, hilar, or axillary lymph nodes. Thyroid gland, trachea, and esophagus demonstrate no significant findings. Lungs/Pleura: There are scattered faint ground-glass opacity seen within the bilateral lower lobes, with associated bibasilar bronchial wall thickening, consistent with atypical pneumonia such as viral etiology. No effusion or pneumothorax. Upper Abdomen: Subtle nodularity of the liver capsule compatible with cirrhosis. No acute upper abdominal finding. Musculoskeletal: No acute or destructive bony lesions. Reconstructed images demonstrate no additional findings. Review of the MIP images confirms the above findings. IMPRESSION: 1. No evidence of pulmonary embolus. 2. Bibasilar bronchial wall thickening with faint scattered ground-glass airspace disease, compatible with atypical infection such as viral etiology. 3. Cardiomegaly. 4. Aortic Atherosclerosis (ICD10-I70.0). Coronary artery atherosclerosis. 5. Subtle nodularity of the liver capsule suggesting underlying cirrhosis. Electronically Signed   By: Sharlet Salina M.D.   On: 07/11/2022 19:01   DG Chest Portable 1 View  Result Date: 07/11/2022 CLINICAL DATA:  Shortness of breath EXAM: PORTABLE CHEST 1 VIEW COMPARISON:  05/07/2022 FINDINGS: Cardiomegaly with vascular congestion and mild diffuse interstitial opacity likely due to mild edema. No sizable effusion. Aortic atherosclerosis. IMPRESSION: Cardiomegaly with vascular congestion and mild diffuse interstitial opacity likely due to mild edema. Electronically  Signed   By: Jasmine Pang M.D.   On: 07/11/2022 17:28     Assessment and Plan:   NSTEMI - presented with chest pain, back pain, SOB and bilateral arm pain.  - HS troponin elevated to 494 - subsequent EKGs showed TWI lateral leads - continue IV heparin - continue Aspirin, Coreg and Crestor -  plan for LHC on Monday Risks and benefits of cardiac catheterization have been discussed with the patient.  These include bleeding, infection, kidney damage, stroke, heart attack, death.  The patient understands these risks and is willing to proceed.   Acute on chronic diastolic CHF - BNP minimally elevated 119 and CXR with mild diffuse interstitial edema - IV lasix 40mg  BID - AM labs pending - minimal UOP so far - echo ordered - may not need much IV diuresis  Acute respiratory failure with hypoxia - CTA chest negative for PE - resolved, now off O2  H/o RVOT - continue flecainide 50mg  BID - follows with EP As OP - continue Coreg and Diltiazem - continue telemetry  HLD - LDL 56  - continue Crestor 5mg  daily  Dementia with behavioral disturbance - appears to be A&O x3  H/o cerebellar stroke - continue PTA ASA and statin  HTN - continue Coreg 12.5mg  BID and diltiazem 120mg  daily  DM2 - SSI per IM  For questions or updates, please contact [facility] Please consult [email or web address] for contact info under    Signed, Jahzaria Vary , PA-C  07/12/2022 9:38 AM

## 2022-07-12 NOTE — Progress Notes (Signed)
       CROSS COVER NOTE  NAME: Yvonne Newton MRN: [id number] DOB : [birth year]       HPI/Events of Note   Patient with ongoing shortness of breath and epigastric pain.  Troponin increase 86 to 259. Already treated for same complaints with IV lasix    Assessment and  Interventions   Assessment:  Plan: GI cocktail. Continue to monitor Continue heparin

## 2022-07-12 NOTE — Progress Notes (Addendum)
PROGRESS NOTE    Yvonne Newton  ZOX:[id number] DOB: 04/02/1941 DOA: 07/11/2022 PCP: [facility]  Outpatient Specialists: cardiology, neurology, endocrinology    Brief Narrative:   From admission h and p Yvonne Newton is a 81 y.o. female with medical history significant for NIDDM, HTN, G1 DD on echo 2019, with no history of CAD, cerebellar stroke in 2019, mild dementia and history of ventricular tachycardia with removal of implantable loop recorder 10/2021 with no events, currently on flecainide, who was brought to the ED by EMS after the onset of bilateral arm pain, upper back pain and chest pain starting while walking outside.  She had associated diaphoresis, shortness of breath and nausea.  History is taken from part from daughter over the phone who states that at baseline patient has chronic dyspnea with lower extremity edema and most nights gets up in the middle of the night to go sleep in a recliner.  She is not certain if this is for comfort or for shortness of breath.  She has had no cough, fever or chills.  She states her mother was previously on a fluid pill but no longer takes it.  Patient reports relief in her chest pain but continues to indicate lower anterior and mid back pain encircling her chest    Assessment & Plan:   Principal Problem:   NSTEMI (non-ST elevated myocardial infarction) [facility](HCC) Active Problems:   Acute respiratory failure with hypoxia (HCC)   Acute on chronic diastolic CHF (congestive heart failure) (HCC)   Chest pain   OBESITY   Diabetes mellitus type 2, uncomplicated (HCC)   Essential hypertension   History of cerebellar stroke 2019   History of ventricular tachycardia   Dementia without behavioral disturbance (HCC)  # NSTEMI Presenting with chest pain, TWIs on EKG, and troponin elevation 494 at last check. Currently chest pain free. Received aspirin - continue heparin - continue npo - cardiology consulted, planning cath on  Monday - nitro prn - TTE pending  # HFpEF Normal EF on TTE several years ago. Here mildly elevated bnp, lower extremity edema though this may be chronic. ACS likely causatory - continue lasix 40 iv qd - TTE pending  # Acute respiratory failure No documented hypoxia but reportedly dyspneic on arrival. Currently on 4 liters but I turned it off and she satted upper 90s throughout my exam - monitor off o2  # Dementia May also have some in-hospital delirium - monitor - home memantine  # Hx v-tach - cont home flecainide  # Hx CVA - cont home asa, statin  # HTN BP mildly elevated - cont home dilt and coreg  # T2DM Glucose appropriate - SSI for home glipizide and metformin    DVT prophylaxis: heparin gtt Code Status: full Family Communication: daughter updated telephonically 11/11  Level of care: Progressive Status is: Inpatient Remains inpatient appropriate because: severity of illness    Consultants:  cardiology  Procedures: pending  Antimicrobials:  none    Subjective: Chest pain resolved. Mild sob. No cough or fever  Objective: Vitals:   07/12/22 0400 07/12/22 0408 07/12/22 0500 07/12/22 0801  BP: (!) 148/60   (!) 108/56  Pulse: 82   75  Resp: (!) 23   18  Temp:  97.6 F (36.4 C)  98.1 F (36.7 C)  TempSrc:  Oral  Oral  SpO2: 99%   99%  Weight:   78.8 kg   Height:   5' (1.524 m)     Intake/Output Summary (  Last 24 hours) at 07/12/2022 0815 Last data filed at 07/12/2022 0354 Gross per 24 hour  Intake 94 ml  Output 300 ml  Net -206 ml   Filed Weights   07/11/22 2008 07/12/22 0500  Weight: 79.6 kg 78.8 kg    Examination:  General exam: Appears calm and comfortable  Respiratory system: Clear to auscultation save for rales at bases Cardiovascular system: S1 & S2 heard, RRR.   Gastrointestinal system: Abdomen is nondistended, soft and nontender. No organomegaly or masses felt. Normal bowel sounds heard. Central nervous system: Alert and  oriented to self. Confused. Moving all 4  Extremities: Symmetric 5 x 5 power. Pitting edema to knees Skin: No rashes, lesions or ulcers Psychiatry: confused, calm    Data Reviewed: I have personally reviewed following labs and imaging studies  CBC: Recent Labs  Lab 07/11/22 1707 07/12/22 0442  WBC 12.9* 14.7*  NEUTROABS 7.5  --   HGB 13.1 13.0  HCT 40.2 40.1  MCV 87.0 86.4  PLT 226 220   Basic Metabolic Panel: Recent Labs  Lab 07/11/22 1707  NA 139  K 4.3  CL 106  CO2 25  GLUCOSE 110*  BUN 20  CREATININE 0.55  CALCIUM 9.4   GFR: Estimated Creatinine Clearance: 51.2 mL/min (by C-G formula based on SCr of 0.55 mg/dL). Liver Function Tests: Recent Labs  Lab 07/11/22 1707  AST 24  ALT 18  ALKPHOS 50  BILITOT 1.2  PROT 7.3  ALBUMIN 4.1   No results for input(s): "LIPASE", "AMYLASE" in the last 168 hours. No results for input(s): "AMMONIA" in the last 168 hours. Coagulation Profile: Recent Labs  Lab 07/11/22 2133  INR 1.1   Cardiac Enzymes: No results for input(s): "CKTOTAL", "CKMB", "CKMBINDEX", "TROPONINI" in the last 168 hours. BNP (last 3 results) No results for input(s): "PROBNP" in the last 8760 hours. HbA1C: Recent Labs    07/11/22 2133  HGBA1C 6.3*   CBG: Recent Labs  Lab 07/11/22 2156 07/12/22 0109 07/12/22 0804  GLUCAP 117* 147* 124*   Lipid Profile: No results for input(s): "CHOL", "HDL", "LDLCALC", "TRIG", "CHOLHDL", "LDLDIRECT" in the last 72 hours. Thyroid Function Tests: No results for input(s): "TSH", "T4TOTAL", "FREET4", "T3FREE", "THYROIDAB" in the last 72 hours. Anemia Panel: No results for input(s): "VITAMINB12", "FOLATE", "FERRITIN", "TIBC", "IRON", "RETICCTPCT" in the last 72 hours. Urine analysis:    Component Value Date/Time   COLORURINE YELLOW (A) 07/12/2022 0010   APPEARANCEUR CLOUDY (A) 07/12/2022 0010   LABSPEC 1.016 07/12/2022 0010   PHURINE 5.0 07/12/2022 0010   GLUCOSEU NEGATIVE 07/12/2022 0010   HGBUR  NEGATIVE 07/12/2022 0010   BILIRUBINUR NEGATIVE 07/12/2022 0010   KETONESUR NEGATIVE 07/12/2022 0010   PROTEINUR NEGATIVE 07/12/2022 0010   NITRITE NEGATIVE 07/12/2022 0010   LEUKOCYTESUR NEGATIVE 07/12/2022 0010   Sepsis Labs: @LABRCNTIP (procalcitonin:4,lacticidven:4)  ) Recent Results (from the past 240 hour(s))  Resp Panel by RT-PCR (Flu A&B, Covid) Anterior Nasal Swab     Status: None   Collection Time: 07/11/22  7:40 PM   Specimen: Anterior Nasal Swab  Result Value Ref Range Status   SARS Coronavirus 2 by RT PCR NEGATIVE NEGATIVE Final    Comment: (NOTE) SARS-CoV-2 target nucleic acids are NOT DETECTED.  The SARS-CoV-2 RNA is generally detectable in upper respiratory specimens during the acute phase of infection. The lowest concentration of SARS-CoV-2 viral copies this assay can detect is 138 copies/mL. A negative result does not preclude SARS-Cov-2 infection and should not be used as the sole basis  for treatment or other patient management decisions. A negative result may occur with  improper specimen collection/handling, submission of specimen other than nasopharyngeal swab, presence of viral mutation(s) within the areas targeted by this assay, and inadequate number of viral copies(<138 copies/mL). A negative result must be combined with clinical observations, patient history, and epidemiological information. The expected result is Negative.  Fact Sheet for Patients:  BloggerCourse.com  Fact Sheet for Healthcare Providers:  SeriousBroker.it  This test is no t yet approved or cleared by the [place] FDA and  has been authorized for detection and/or diagnosis of SARS-CoV-2 by FDA under an Emergency Use Authorization (EUA). This EUA will remain  in effect (meaning this test can be used) for the duration of the COVID-19 declaration under Section 564(b)(1) of the Act, 21 U.S.C.section 360bbb-3(b)(1), unless the  authorization is terminated  or revoked sooner.       Influenza A by PCR NEGATIVE NEGATIVE Final   Influenza B by PCR NEGATIVE NEGATIVE Final    Comment: (NOTE) The Xpert Xpress SARS-CoV-2/FLU/RSV plus assay is intended as an aid in the diagnosis of influenza from Nasopharyngeal swab specimens and should not be used as a sole basis for treatment. Nasal washings and aspirates are unacceptable for Xpert Xpress SARS-CoV-2/FLU/RSV testing.  Fact Sheet for Patients: BloggerCourse.com  Fact Sheet for Healthcare Providers: SeriousBroker.it  This test is not yet approved or cleared by the [place] FDA and has been authorized for detection and/or diagnosis of SARS-CoV-2 by FDA under an Emergency Use Authorization (EUA). This EUA will remain in effect (meaning this test can be used) for the duration of the COVID-19 declaration under Section 564(b)(1) of the Act, 21 U.S.C. section 360bbb-3(b)(1), unless the authorization is terminated or revoked.  Performed at [facility], [address] [place], [name] [address]          Radiology Studies: CT Angio Chest PE W and/or Wo Contrast  Result Date: 07/11/2022 CLINICAL DATA:  Right arm pain, back pain, short of breath, positive D-dimer EXAM: CT ANGIOGRAPHY CHEST WITH CONTRAST TECHNIQUE: Multidetector CT imaging of the chest was performed using the standard protocol during bolus administration of intravenous contrast. Multiplanar CT image reconstructions and MIPs were obtained to evaluate the vascular anatomy. RADIATION DOSE REDUCTION: This exam was performed according to the departmental dose-optimization program which includes automated exposure control, adjustment of the mA and/or kV according to patient size and/or use of iterative reconstruction technique. CONTRAST:  45mL OMNIPAQUE IOHEXOL 350 MG/ML SOLN COMPARISON:  07/11/2022 FINDINGS: Cardiovascular: This is a  technically adequate evaluation of the pulmonary vasculature. No filling defects or pulmonary emboli. Mild cardiomegaly without pericardial effusion. Normal caliber of the thoracic aorta. Atherosclerosis of the aorta and coronary vasculature. Mediastinum/Nodes: No enlarged mediastinal, hilar, or axillary lymph nodes. Thyroid gland, trachea, and esophagus demonstrate no significant findings. Lungs/Pleura: There are scattered faint ground-glass opacity seen within the bilateral lower lobes, with associated bibasilar bronchial wall thickening, consistent with atypical pneumonia such as viral etiology. No effusion or pneumothorax. Upper Abdomen: Subtle nodularity of the liver capsule compatible with cirrhosis. No acute upper abdominal finding. Musculoskeletal: No acute or destructive bony lesions. Reconstructed images demonstrate no additional findings. Review of the MIP images confirms the above findings. IMPRESSION: 1. No evidence of pulmonary embolus. 2. Bibasilar bronchial wall thickening with faint scattered ground-glass airspace disease, compatible with atypical infection such as viral etiology. 3. Cardiomegaly. 4. Aortic Atherosclerosis (ICD10-I70.0). Coronary artery atherosclerosis. 5. Subtle nodularity of the liver capsule suggesting underlying cirrhosis.  Electronically Signed   By: Sharlet Salina M.D.   On: 07/11/2022 19:01   DG Chest Portable 1 View  Result Date: 07/11/2022 CLINICAL DATA:  Shortness of breath EXAM: PORTABLE CHEST 1 VIEW COMPARISON:  05/07/2022 FINDINGS: Cardiomegaly with vascular congestion and mild diffuse interstitial opacity likely due to mild edema. No sizable effusion. Aortic atherosclerosis. IMPRESSION: Cardiomegaly with vascular congestion and mild diffuse interstitial opacity likely due to mild edema. Electronically Signed   By: Jasmine Pang M.D.   On: 07/11/2022 17:28        Scheduled Meds:  aspirin EC  81 mg Oral Daily   carvedilol  12.5 mg Oral BID   flecainide  50  mg Oral BID   insulin aspart  0-15 Units Subcutaneous Q4H   memantine  10 mg Oral BID   pantoprazole  40 mg Oral Daily   rosuvastatin  5 mg Oral q1800   Continuous Infusions:  sodium chloride 10 mL/hr at 07/12/22 0000   heparin 1,000 Units/hr (07/12/22 9485)     LOS: 1 day     Silvano Bilis, MD [facility]   If 7PM-7AM, please contact night-coverage [email or web address] Password [email or web address] 07/12/2022, 8:15 AM

## 2022-07-12 NOTE — Plan of Care (Signed)
  Problem: Education: Goal: Ability to describe self-care measures that may prevent or decrease complications (Diabetes Survival Skills Education) will improve Outcome: Progressing Goal: Individualized Educational Video(s) Outcome: Progressing   Problem: Coping: Goal: Ability to adjust to condition or change in health will improve Outcome: Progressing   Problem: Fluid Volume: Goal: Ability to maintain a balanced intake and output will improve Outcome: Progressing   Problem: Health Behavior/Discharge Planning: Goal: Ability to identify and utilize available resources and services will improve Outcome: Progressing Goal: Ability to manage health-related needs will improve Outcome: Progressing   Problem: Metabolic: Goal: Ability to maintain appropriate glucose levels will improve Outcome: Progressing   Problem: Nutritional: Goal: Maintenance of adequate nutrition will improve Outcome: Progressing Goal: Progress toward achieving an optimal weight will improve Outcome: Progressing   Problem: Skin Integrity: Goal: Risk for impaired skin integrity will decrease Outcome: Progressing   Problem: Tissue Perfusion: Goal: Adequacy of tissue perfusion will improve Outcome: Progressing   Problem: Education: Goal: Understanding of cardiac disease, CV risk reduction, and recovery process will improve Outcome: Progressing Goal: Individualized Educational Video(s) Outcome: Progressing   Problem: Activity: Goal: Ability to tolerate increased activity will improve Outcome: Progressing   Problem: Cardiac: Goal: Ability to achieve and maintain adequate cardiovascular perfusion will improve Outcome: Progressing   Problem: Health Behavior/Discharge Planning: Goal: Ability to safely manage health-related needs after discharge will improve Outcome: Progressing   Problem: Education: Goal: Knowledge of General Education information will improve Description: Including pain rating scale,  medication(s)/side effects and non-pharmacologic comfort measures Outcome: Progressing   Problem: Health Behavior/Discharge Planning: Goal: Ability to manage health-related needs will improve Outcome: Progressing   Problem: Clinical Measurements: Goal: Ability to maintain clinical measurements within normal limits will improve Outcome: Progressing Goal: Will remain free from infection Outcome: Progressing Goal: Diagnostic test results will improve Outcome: Progressing Goal: Respiratory complications will improve Outcome: Progressing Goal: Cardiovascular complication will be avoided Outcome: Progressing   Problem: Activity: Goal: Risk for activity intolerance will decrease Outcome: Progressing   Problem: Nutrition: Goal: Adequate nutrition will be maintained Outcome: Progressing   Problem: Elimination: Goal: Will not experience complications related to bowel motility Outcome: Progressing Goal: Will not experience complications related to urinary retention Outcome: Progressing   Problem: Pain Managment: Goal: General experience of comfort will improve Outcome: Progressing   Problem: Safety: Goal: Ability to remain free from injury will improve Outcome: Progressing   Problem: Skin Integrity: Goal: Risk for impaired skin integrity will decrease Outcome: Progressing

## 2022-07-12 NOTE — Consult Note (Signed)
ANTICOAGULATION CONSULT NOTE - Initial Consult  Pharmacy Consult for Heparin Indication: chest pain/ACS  Allergies  Allergen Reactions   Blueberry Flavor Hives and Shortness Of Breath   Sulfa Antibiotics Itching   Sulfonamide Derivatives Itching   Other Swelling and Rash    METAL    Patient Measurements: Height: 5' (152.4 cm) Weight: 78.8 kg (173 lb 12.8 oz) IBW/kg (Calculated) : 45.5 Heparin Dosing Weight: 63.7 kg  Vital Signs: Temp: 97.6 F (36.4 C) (11/11 0408) Temp Source: Oral (11/11 0408) BP: 148/60 (11/11 0400) Pulse Rate: 82 (11/11 0400)  Labs: Recent Labs    07/11/22 1707 07/11/22 1922 07/11/22 2133 07/11/22 2300 07/12/22 0045 07/12/22 0442  HGB 13.1  --   --   --   --  13.0  HCT 40.2  --   --   --   --  40.1  PLT 226  --   --   --   --  220  APTT  --   --  105*  --   --   --   LABPROT  --   --  14.2  --   --   --   INR  --   --  1.1  --   --   --   HEPARINUNFRC  --   --   --   --   --  0.15*  CREATININE 0.55  --   --   --   --   --   TROPONINIHS 86* 259*  --  459* 494*  --      Estimated Creatinine Clearance: 51.2 mL/min (by C-G formula based on SCr of 0.55 mg/dL).   Medical History: Past Medical History:  Diagnosis Date   Arthritis 07/22/2017   Cerebellar stroke (HCC)    Diabetes mellitus type 2 in nonobese (HCC)    DM (diabetes mellitus) (HCC)    Dysarthria    Essential hypertension 07/22/2017   Hyperlipidemia, unspecified 07/22/2017   OA (osteoarthritis) of knee    right   Obesity    Stroke (HCC)    Tachycardia 07/22/2017   VENTRICULAR TACHYCARDIA 09/05/2009   Qualifier: Diagnosis of  By: Susette Racer CMA, Jewel     Ventricular tachycardia (HCC)    Vitamin B 12 deficiency     Medications:  Medications Prior to Admission  Medication Sig Dispense Refill Last Dose   aspirin EC 81 MG EC tablet Take 1 tablet (81 mg total) by mouth daily.   07/11/2022   carvedilol (COREG) 12.5 MG tablet TAKE 1 TABLET BY MOUTH TWICE A DAY 180 tablet 3  07/11/2022   cetirizine (ZYRTEC) 10 MG tablet Take 10 mg by mouth daily.  2 07/11/2022   CYANOCOBALAMIN IJ Inject as directed every 30 (thirty) days. On or about the 26th of each month   Past Month   diltiazem (CARDIZEM CD) 120 MG 24 hr capsule TAKE 1 CAPSULE BY MOUTH EVERY DAY 90 capsule 3 07/11/2022   flecainide (TAMBOCOR) 50 MG tablet TAKE 1 TABLET (50 MG TOTAL) BY MOUTH 2 (TWO) TIMES DAILY. 60 tablet 2 07/11/2022   glipiZIDE (GLUCOTROL XL) 2.5 MG 24 hr tablet Take 2.5 mg by mouth daily with breakfast.   07/11/2022   memantine (NAMENDA TITRATION PACK) tablet pack Take 1 tablet by mouth in the morning and at bedtime.   07/11/2022   metFORMIN (GLUCOPHAGE-XR) 500 MG 24 hr tablet Take 500-1,000 mg by mouth See admin instructions. Take one tablet (500 mg) by mouth every morning and two tablets (1000  mg) at bedtime   07/11/2022   omeprazole (PRILOSEC) 20 MG capsule Take 20 mg by mouth daily.    07/11/2022   rosuvastatin (CRESTOR) 5 MG tablet Take 1 tablet (5 mg total) by mouth daily at 6 PM. 90 tablet 3 07/10/2022   EPINEPHrine 0.3 mg/0.3 mL IJ SOAJ injection Inject 0.3 mg into the muscle once as needed (severe allergic reaction).   prn   Vitamin D, Ergocalciferol, (DRISDOL) 50000 units CAPS capsule Take 50,000 Units by mouth every 7 (seven) days. (Patient not taking: Reported on 07/11/2022)   Not Taking   Scheduled:   aspirin EC  81 mg Oral Daily   carvedilol  12.5 mg Oral BID   flecainide  50 mg Oral BID   heparin  1,900 Units Intravenous Once   insulin aspart  0-15 Units Subcutaneous Q4H   memantine  10 mg Oral BID   pantoprazole  40 mg Oral Daily   rosuvastatin  5 mg Oral q1800   Infusions:   sodium chloride 10 mL/hr at 07/12/22 0000   heparin 750 Units/hr (07/12/22 0000)   PRN:  Anti-infectives (From admission, onward)    None       Assessment: Pharmacy consulted to start heparin infusion for ACS. NO DOAC PTA. CBC stable. Trop elevated to 259.   11/11:  HL @ 0442 = 0.15,  SUBtherapeutic   Goal of Therapy:  Heparin level 0.3-0.7 units/ml Monitor platelets by anticoagulation protocol: Yes   Plan:  11/11: HL @ 0442 = 0.15, SUBtherapeutic  Will order heparin 1900 units IV X 1 bolus and increase drip rate to 1000 units/hr. Will recheck HL 8 hrs after rate change.   Essence Merle D 07/12/2022,5:53 AM

## 2022-07-12 NOTE — Plan of Care (Signed)
  Problem: Education: Goal: Ability to describe self-care measures that may prevent or decrease complications (Diabetes Survival Skills Education) will improve Outcome: Progressing Goal: Individualized Educational Video(s) Outcome: Progressing   Problem: Coping: Goal: Ability to adjust to condition or change in health will improve Outcome: Progressing   Problem: Fluid Volume: Goal: Ability to maintain a balanced intake and output will improve Outcome: Progressing   Problem: Health Behavior/Discharge Planning: Goal: Ability to identify and utilize available resources and services will improve Outcome: Progressing Goal: Ability to manage health-related needs will improve Outcome: Progressing   Problem: Metabolic: Goal: Ability to maintain appropriate glucose levels will improve Outcome: Progressing   Problem: Nutritional: Goal: Maintenance of adequate nutrition will improve Outcome: Progressing Goal: Progress toward achieving an optimal weight will improve Outcome: Progressing   Problem: Tissue Perfusion: Goal: Adequacy of tissue perfusion will improve Outcome: Progressing   Problem: Skin Integrity: Goal: Risk for impaired skin integrity will decrease Outcome: Progressing   Problem: Education: Goal: Understanding of cardiac disease, CV risk reduction, and recovery process will improve Outcome: Progressing Goal: Individualized Educational Video(s) Outcome: Progressing   Problem: Activity: Goal: Ability to tolerate increased activity will improve Outcome: Progressing   Problem: Cardiac: Goal: Ability to achieve and maintain adequate cardiovascular perfusion will improve Outcome: Progressing   Problem: Health Behavior/Discharge Planning: Goal: Ability to manage health-related needs will improve Outcome: Progressing   Problem: Clinical Measurements: Goal: Ability to maintain clinical measurements within normal limits will improve Outcome: Progressing Goal: Will  remain free from infection Outcome: Progressing Goal: Diagnostic test results will improve Outcome: Progressing Goal: Respiratory complications will improve Outcome: Progressing Goal: Cardiovascular complication will be avoided Outcome: Progressing   Problem: Activity: Goal: Risk for activity intolerance will decrease Outcome: Progressing   Problem: Coping: Goal: Level of anxiety will decrease Outcome: Progressing   Problem: Nutrition: Goal: Adequate nutrition will be maintained Outcome: Progressing   Problem: Elimination: Goal: Will not experience complications related to bowel motility Outcome: Progressing Goal: Will not experience complications related to urinary retention Outcome: Progressing   Problem: Safety: Goal: Ability to remain free from injury will improve Outcome: Progressing   Problem: Skin Integrity: Goal: Risk for impaired skin integrity will decrease Outcome: Progressing

## 2022-07-12 NOTE — Consult Note (Addendum)
[place] for Heparin Infusion Indication: chest pain/ACS  Allergies  Allergen Reactions   Blueberry Flavor Hives and Shortness Of Breath   Sulfa Antibiotics Itching   Sulfonamide Derivatives Itching   Other Swelling and Rash    METAL    Patient Measurements: Height: 5' (152.4 cm) Weight: 78.8 kg (173 lb 12.8 oz) IBW/kg (Calculated) : 45.5 Heparin Dosing Weight: 63.7 kg  Vital Signs: Temp: 98.1 F (36.7 C) (11/11 1157) Temp Source: Oral (11/11 1157) BP: 100/61 (11/11 1157) Pulse Rate: 69 (11/11 1157)  Labs: Recent Labs    07/11/22 1707 07/11/22 1922 07/11/22 2133 07/11/22 2300 07/12/22 0045 07/12/22 0442  HGB 13.1  --   --   --   --  13.0  HCT 40.2  --   --   --   --  40.1  PLT 226  --   --   --   --  220  APTT  --   --  105*  --   --   --   LABPROT  --   --  14.2  --   --   --   INR  --   --  1.1  --   --   --   HEPARINUNFRC  --   --   --   --   --  0.15*  CREATININE 0.55  --   --   --   --   --   TROPONINIHS 86* 259*  --  459* 494*  --      Estimated Creatinine Clearance: 51.2 mL/min (by C-G formula based on SCr of 0.55 mg/dL).   Medical History: Past Medical History:  Diagnosis Date   Arthritis 07/22/2017   Cerebellar stroke ([place])    Diabetes mellitus type 2 in nonobese ([place])    DM (diabetes mellitus) ([place])    Dysarthria    Essential hypertension 07/22/2017   Hyperlipidemia, unspecified 07/22/2017   OA (osteoarthritis) of knee    right   Obesity    Stroke ([place])    Tachycardia 07/22/2017   VENTRICULAR TACHYCARDIA 09/05/2009   Qualifier: Diagnosis of  By: Selena Batten CMA, Jewel     Ventricular tachycardia ([place])    Vitamin B 12 deficiency     Medications:  Medications Prior to Admission  Medication Sig Dispense Refill Last Dose   aspirin EC 81 MG EC tablet Take 1 tablet (81 mg total) by mouth daily.   07/11/2022   carvedilol (COREG) 12.5 MG tablet TAKE 1 TABLET BY MOUTH TWICE A DAY 180 tablet 3 07/11/2022    cetirizine (ZYRTEC) 10 MG tablet Take 10 mg by mouth daily.  2 07/11/2022   CYANOCOBALAMIN IJ Inject as directed every 30 (thirty) days. On or about the 26th of each month   Past Month   diltiazem (CARDIZEM CD) 120 MG 24 hr capsule TAKE 1 CAPSULE BY MOUTH EVERY DAY 90 capsule 3 07/11/2022   flecainide (TAMBOCOR) 50 MG tablet TAKE 1 TABLET (50 MG TOTAL) BY MOUTH 2 (TWO) TIMES DAILY. 60 tablet 2 07/11/2022   glipiZIDE (GLUCOTROL XL) 2.5 MG 24 hr tablet Take 2.5 mg by mouth daily with breakfast.   07/11/2022   memantine (NAMENDA TITRATION PACK) tablet pack Take 1 tablet by mouth in the morning and at bedtime.   07/11/2022   metFORMIN (GLUCOPHAGE-XR) 500 MG 24 hr tablet Take 500-1,000 mg by mouth See admin instructions. Take one tablet (500 mg) by mouth every morning and two tablets (1000 mg) at  bedtime   07/11/2022   omeprazole (PRILOSEC) 20 MG capsule Take 20 mg by mouth daily.    07/11/2022   rosuvastatin (CRESTOR) 5 MG tablet Take 1 tablet (5 mg total) by mouth daily at 6 PM. 90 tablet 3 07/10/2022   EPINEPHrine 0.3 mg/0.3 mL IJ SOAJ injection Inject 0.3 mg into the muscle once as needed (severe allergic reaction).   prn   Vitamin D, Ergocalciferol, (DRISDOL) 50000 units CAPS capsule Take 50,000 Units by mouth every 7 (seven) days. (Patient not taking: Reported on 07/11/2022)   Not Taking   Scheduled:   aspirin EC  81 mg Oral Daily   carvedilol  12.5 mg Oral BID   diltiazem  120 mg Oral Q48H   flecainide  50 mg Oral BID   furosemide  40 mg Intravenous Daily   insulin aspart  0-15 Units Subcutaneous Q4H   loratadine  10 mg Oral Daily   memantine  10 mg Oral BID   pantoprazole  40 mg Oral Daily   rosuvastatin  5 mg Oral q1800   Infusions:   sodium chloride 10 mL/hr at 07/12/22 0818   heparin 1,000 Units/hr (07/12/22 8768)   PRN:  Anti-infectives (From admission, onward)    None       Assessment: Yvonne Newton is a 81 y.o. female presenting with concern for NSTEMI. 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 Patient was not on [facility] PTA per chart review. She has troponin elevation with peak at 494. She has planned [facility] 11/13. Pharmacy has been consulted to initiate and manage heparin infusion.   Baseline Labs: Hgb 13.1, Hct 40.2, Plt 226; aPTT and PT/INR not taken before start of heparin infusion  Goal of Therapy:  Heparin level 0.3-0.7 units/ml Monitor platelets by anticoagulation protocol: Yes   Date Time HL Rate/Comment  11/11 0442 0.15 750/SUBtherapeutic 11/11 1457 0.29 1000/SUBtherapeutic   Plan:  Give heparin bolus of 950 units Increase heparin infusion to 1100 units/hr Check HL in 8 hours Continue to monitor H&H and platelets daily while on heparin  Celene Squibb, PharmD PGY1 Pharmacy Resident 07/12/2022 3:51 PM

## 2022-07-13 ENCOUNTER — Other Ambulatory Visit: Payer: Self-pay | Admitting: Cardiovascular Disease

## 2022-07-13 ENCOUNTER — Inpatient Hospital Stay (HOSPITAL_COMMUNITY)
Admit: 2022-07-13 | Discharge: 2022-07-13 | Disposition: A | Discharge status: Home / Self Care | Payer: PPO | Attending: Cardiovascular Disease | Admitting: Cardiovascular Disease

## 2022-07-13 DIAGNOSIS — I214 Non-ST elevation (NSTEMI) myocardial infarction: Secondary | ICD-10-CM

## 2022-07-13 DIAGNOSIS — E782 Mixed hyperlipidemia: Secondary | ICD-10-CM

## 2022-07-13 LAB — GLUCOSE, CAPILLARY
Glucose-Capillary: 131 mg/dL — ABNORMAL HIGH (ref 70–99)
Glucose-Capillary: 116 mg/dL — ABNORMAL HIGH (ref 70–99)
Glucose-Capillary: 166 mg/dL — ABNORMAL HIGH (ref 70–99)
Glucose-Capillary: 162 mg/dL — ABNORMAL HIGH (ref 70–99)
Glucose-Capillary: 115 mg/dL — ABNORMAL HIGH (ref 70–99)

## 2022-07-13 LAB — CBC
HCT: 38 % (ref 36.0–46.0)
Hemoglobin: 12.2 g/dL (ref 12.0–15.0)
MCH: 27.9 pg (ref 26.0–34.0)
MCHC: 32.1 g/dL (ref 30.0–36.0)
MCV: 87 fL (ref 80.0–100.0)
Platelets: 194 10*3/uL (ref 150–400)
RBC: 4.37 MIL/uL (ref 3.87–5.11)
RDW: 13.6 % (ref 11.5–15.5)
WBC: 12.8 10*3/uL — ABNORMAL HIGH (ref 4.0–10.5)
nRBC: 0 % (ref 0.0–0.2)

## 2022-07-13 LAB — ECHOCARDIOGRAM COMPLETE
AR max vel: 2.58 cm2
AV Peak grad: 9.3 mmHg
Ao pk vel: 1.53 m/s
Area-P 1/2: 3.23 cm2
Calc EF: 57.4 %
Height: 60 in
S' Lateral: 3.5 cm
Single Plane A2C EF: 55.7 %
Single Plane A4C EF: 60.3 %
Weight: 2780.8 oz

## 2022-07-13 LAB — MAGNESIUM: Magnesium: 1.7 mg/dL (ref 1.7–2.4)

## 2022-07-13 LAB — BASIC METABOLIC PANEL
Anion gap: 5 (ref 5–15)
BUN: 19 mg/dL (ref 8–23)
CO2: 29 mmol/L (ref 22–32)
Calcium: 8.4 mg/dL — ABNORMAL LOW (ref 8.9–10.3)
Chloride: 104 mmol/L (ref 98–111)
Creatinine, Ser: 0.81 mg/dL (ref 0.44–1.00)
GFR, Estimated: >60 mL/min (ref >60)
Glucose, Bld: 136 mg/dL — ABNORMAL HIGH (ref 70–99)
Potassium: 3.3 mmol/L — ABNORMAL LOW (ref 3.5–5.1)
Sodium: 138 mmol/L (ref 135–145)

## 2022-07-13 LAB — HEPARIN LEVEL (UNFRACTIONATED)
Heparin Unfractionated: 0.55 IU/mL (ref 0.30–0.70)
Heparin Unfractionated: 0.47 IU/mL (ref 0.30–0.70)

## 2022-07-13 MED ORDER — MAGNESIUM SULFATE 2 GM/50ML IV SOLN
2.0000 g | Freq: Once | INTRAVENOUS | Status: AC
  Administered 2022-07-13: 2 g via INTRAVENOUS
  Filled 2022-07-13: qty 50

## 2022-07-13 MED ORDER — POTASSIUM CHLORIDE CRYS ER 20 MEQ PO TBCR
60.0000 meq | EXTENDED_RELEASE_TABLET | Freq: Once | ORAL | Status: AC
  Administered 2022-07-13: 60 meq via ORAL
  Filled 2022-07-13: qty 3

## 2022-07-13 MED ORDER — ASPIRIN 81 MG PO CHEW
81.0000 mg | CHEWABLE_TABLET | ORAL | Status: AC
  Administered 2022-07-14: 81 mg via ORAL
  Filled 2022-07-13: qty 1

## 2022-07-13 MED ORDER — SODIUM CHLORIDE 0.9 % WEIGHT BASED INFUSION
1.0000 mL/kg/h | INTRAVENOUS | Status: DC
  Administered 2022-07-14: 1 mL/kg/h via INTRAVENOUS

## 2022-07-13 MED ORDER — SODIUM CHLORIDE 0.9 % WEIGHT BASED INFUSION
3.0000 mL/kg/h | INTRAVENOUS | Status: DC
  Administered 2022-07-14: 3 mL/kg/h via INTRAVENOUS

## 2022-07-13 NOTE — Consult Note (Signed)
ANTICOAGULATION CONSULT NOTE  Pharmacy Consult for Heparin Infusion Indication: chest pain/ACS  Allergies  Allergen Reactions   Blueberry Flavor Hives and Shortness Of Breath   Sulfa Antibiotics Itching   Sulfonamide Derivatives Itching   Other Swelling and Rash    METAL    Patient Measurements: Height: 5' (152.4 cm) Weight: 78.8 kg (173 lb 12.8 oz) IBW/kg (Calculated) : 45.5 Heparin Dosing Weight: 63.7 kg  Vital Signs: Temp: 97.9 F (36.6 C) (11/12 0830) Temp Source: Oral (11/12 0830) BP: 101/72 (11/12 0955) Pulse Rate: 68 (11/12 0830)  Labs: Recent Labs    07/11/22 1707 07/11/22 1707 07/11/22 1922 07/11/22 2133 07/11/22 2300 07/12/22 0045 07/12/22 0442 07/12/22 1457 07/13/22 0104 07/13/22 0318 07/13/22 0852  HGB 13.1  --   --   --   --   --  13.0  --   --  12.2  --   HCT 40.2  --   --   --   --   --  40.1  --   --  38.0  --   PLT 226  --   --   --   --   --  220  --   --  194  --   APTT  --   --   --  105*  --   --   --   --   --   --   --   LABPROT  --   --   --  14.2  --   --   --   --   --   --   --   INR  --   --   --  1.1  --   --   --   --   --   --   --   HEPARINUNFRC  --    < >  --   --   --   --  0.15* 0.29* 0.55  --  0.47  CREATININE 0.55  --   --   --   --   --   --   --   --  0.81  --   TROPONINIHS 86*  --  259*  --  459* 494*  --   --   --   --   --    < > = values in this interval not displayed.     Estimated Creatinine Clearance: 50.6 mL/min (by C-G formula based on SCr of 0.81 mg/dL).   Medical History: Past Medical History:  Diagnosis Date   Arthritis 07/22/2017   Cerebellar stroke ([facility])    Diabetes mellitus type 2 in nonobese (H[facility])    DM (diabetes mellitus) (H[facility])    Dysarthria    Essential hypertension 07/22/2017   Hyperlipidemia, unspecified 07/22/2017   OA (osteoarthritis) of knee    right   Obesity    Stroke (H[facility])    Tachycardia 07/22/2017   VENTRICULAR TACHYCARDIA 09/05/2009   Qualifier: Diagnosis of  By: Susette Racer CMA, Jewel      Ventricular tachycardia (H[facility])    Vitamin B 12 deficiency     Medications:  Medications Prior to Admission  Medication Sig Dispense Refill Last Dose   aspirin EC 81 MG EC tablet Take 1 tablet (81 mg total) by mouth daily.   07/11/2022   carvedilol (COREG) 12.5 MG tablet TAKE 1 TABLET BY MOUTH TWICE A DAY 180 tablet 3 07/11/2022   cetirizine (ZYRTEC) 10 MG tablet Take 10 mg by mouth daily.  2  07/11/2022   CYANOCOBALAMIN IJ Inject as directed every 30 (thirty) days. On or about the 26th of each month   Past Month   diltiazem (CARDIZEM CD) 120 MG 24 hr capsule TAKE 1 CAPSULE BY MOUTH EVERY DAY 90 capsule 3 07/11/2022   flecainide (TAMBOCOR) 50 MG tablet TAKE 1 TABLET (50 MG TOTAL) BY MOUTH 2 (TWO) TIMES DAILY. 60 tablet 2 07/11/2022   glipiZIDE (GLUCOTROL XL) 2.5 MG 24 hr tablet Take 2.5 mg by mouth daily with breakfast.   07/11/2022   memantine (NAMENDA TITRATION PACK) tablet pack Take 1 tablet by mouth in the morning and at bedtime.   07/11/2022   metFORMIN (GLUCOPHAGE-XR) 500 MG 24 hr tablet Take 500-1,000 mg by mouth See admin instructions. Take one tablet (500 mg) by mouth every morning and two tablets (1000 mg) at bedtime   07/11/2022   omeprazole (PRILOSEC) 20 MG capsule Take 20 mg by mouth daily.    07/11/2022   rosuvastatin (CRESTOR) 5 MG tablet Take 1 tablet (5 mg total) by mouth daily at 6 PM. 90 tablet 3 07/10/2022   EPINEPHrine 0.3 mg/0.3 mL IJ SOAJ injection Inject 0.3 mg into the muscle once as needed (severe allergic reaction).   prn   Vitamin D, Ergocalciferol, (DRISDOL) 50000 units CAPS capsule Take 50,000 Units by mouth every 7 (seven) days. (Patient not taking: Reported on 07/11/2022)   Not Taking   Scheduled:   aspirin EC  81 mg Oral Daily   carvedilol  12.5 mg Oral BID   diltiazem  120 mg Oral Q48H   flecainide  50 mg Oral BID   furosemide  40 mg Intravenous Daily   insulin aspart  0-15 Units Subcutaneous Q4H   loratadine  10 mg Oral Daily   memantine  10 mg Oral BID    pantoprazole  40 mg Oral Daily   rosuvastatin  5 mg Oral q1800   Infusions:   sodium chloride 10 mL/hr at 07/12/22 2000   heparin 1,100 Units/hr (07/12/22 2000)   PRN:  Anti-infectives (From admission, onward)    None       Assessment: Yvonne Newton is a 81 y.o. female presenting with concern for NSTEMI. 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 Patient was not on [facility] PTA per chart review. She has troponin elevation with peak at 494. She has planned [facility] 11/13. Pharmacy has been consulted to initiate and manage heparin infusion.    Goal of Therapy:  Heparin level 0.3-0.7 units/ml Monitor platelets by anticoagulation protocol: Yes   Date Time HL Rate/Comment  11/11 0442 0.15 750/SUBtherapeutic 11/11 1457 0.29 1000/SUBtherapeutic  11/12   0104    0.55     1100/Therapeutic X 1  11/12 0852 0.47 1100/therapeutic x 2   Plan:  Heparin level is therapeutic. Will continue heparin infusion at 1100 units/hr. Recheck heparin level and CBC with AM labs. Plan for cath 11/13.    Ronnald Ramp, PharmD,  07/13/2022 10:06 AM

## 2022-07-13 NOTE — Progress Notes (Signed)
Echocardiogram 2D Echocardiogram has been performed.  Yvonne Newton 07/13/2022, 10:28 AM

## 2022-07-13 NOTE — Consult Note (Signed)
ANTICOAGULATION CONSULT NOTE  Pharmacy Consult for Heparin Infusion Indication: chest pain/ACS  Allergies  Allergen Reactions   Blueberry Flavor Hives and Shortness Of Breath   Sulfa Antibiotics Itching   Sulfonamide Derivatives Itching   Other Swelling and Rash    METAL    Patient Measurements: Height: 5' (152.4 cm) Weight: 78.8 kg (173 lb 12.8 oz) IBW/kg (Calculated) : 45.5 Heparin Dosing Weight: 63.7 kg  Vital Signs: Temp: 97.8 F (36.6 C) (11/11 1942) Temp Source: Oral (11/11 1942) BP: 108/52 (11/12 0017) Pulse Rate: 66 (11/12 0017)  Labs: Recent Labs    07/11/22 1707 07/11/22 1922 07/11/22 2133 07/11/22 2300 07/12/22 0045 07/12/22 0442 07/12/22 1457 07/13/22 0104  HGB 13.1  --   --   --   --  13.0  --   --   HCT 40.2  --   --   --   --  40.1  --   --   PLT 226  --   --   --   --  220  --   --   APTT  --   --  105*  --   --   --   --   --   LABPROT  --   --  14.2  --   --   --   --   --   INR  --   --  1.1  --   --   --   --   --   HEPARINUNFRC  --   --   --   --   --  0.15* 0.29* 0.55  CREATININE 0.55  --   --   --   --   --   --   --   TROPONINIHS 86* 259*  --  459* 494*  --   --   --      Estimated Creatinine Clearance: 51.2 mL/min (by C-G formula based on SCr of 0.55 mg/dL).   Medical History: Past Medical History:  Diagnosis Date   Arthritis 07/22/2017   Cerebellar stroke ([facility])    Diabetes mellitus type 2 in nonobese ([facility])    DM (diabetes mellitus) ([facility])    Dysarthria    Essential hypertension 07/22/2017   Hyperlipidemia, unspecified 07/22/2017   OA (osteoarthritis) of knee    right   Obesity    Stroke ([facility])    Tachycardia 07/22/2017   VENTRICULAR TACHYCARDIA 09/05/2009   Qualifier: Diagnosis of  By: Susette Racer CMA, Jewel     Ventricular tachycardia (H[facility])    Vitamin B 12 deficiency     Medications:  Medications Prior to Admission  Medication Sig Dispense Refill Last Dose   aspirin EC 81 MG EC tablet Take 1 tablet (81 mg total) by mouth  daily.   07/11/2022   carvedilol (COREG) 12.5 MG tablet TAKE 1 TABLET BY MOUTH TWICE A DAY 180 tablet 3 07/11/2022   cetirizine (ZYRTEC) 10 MG tablet Take 10 mg by mouth daily.  2 07/11/2022   CYANOCOBALAMIN IJ Inject as directed every 30 (thirty) days. On or about the 26th of each month   Past Month   diltiazem (CARDIZEM CD) 120 MG 24 hr capsule TAKE 1 CAPSULE BY MOUTH EVERY DAY 90 capsule 3 07/11/2022   flecainide (TAMBOCOR) 50 MG tablet TAKE 1 TABLET (50 MG TOTAL) BY MOUTH 2 (TWO) TIMES DAILY. 60 tablet 2 07/11/2022   glipiZIDE (GLUCOTROL XL) 2.5 MG 24 hr tablet Take 2.5 mg by mouth daily with breakfast.   07/11/2022  memantine (NAMENDA TITRATION PACK) tablet pack Take 1 tablet by mouth in the morning and at bedtime.   07/11/2022   metFORMIN (GLUCOPHAGE-XR) 500 MG 24 hr tablet Take 500-1,000 mg by mouth See admin instructions. Take one tablet (500 mg) by mouth every morning and two tablets (1000 mg) at bedtime   07/11/2022   omeprazole (PRILOSEC) 20 MG capsule Take 20 mg by mouth daily.    07/11/2022   rosuvastatin (CRESTOR) 5 MG tablet Take 1 tablet (5 mg total) by mouth daily at 6 PM. 90 tablet 3 07/10/2022   EPINEPHrine 0.3 mg/0.3 mL IJ SOAJ injection Inject 0.3 mg into the muscle once as needed (severe allergic reaction).   prn   Vitamin D, Ergocalciferol, (DRISDOL) 50000 units CAPS capsule Take 50,000 Units by mouth every 7 (seven) days. (Patient not taking: Reported on 07/11/2022)   Not Taking   Scheduled:   aspirin EC  81 mg Oral Daily   carvedilol  12.5 mg Oral BID   diltiazem  120 mg Oral Q48H   flecainide  50 mg Oral BID   furosemide  40 mg Intravenous Daily   insulin aspart  0-15 Units Subcutaneous Q4H   loratadine  10 mg Oral Daily   memantine  10 mg Oral BID   pantoprazole  40 mg Oral Daily   rosuvastatin  5 mg Oral q1800   Infusions:   sodium chloride 10 mL/hr at 07/12/22 2000   heparin 1,100 Units/hr (07/12/22 2000)   PRN:  Anti-infectives (From admission, onward)     None       Assessment: Sundra Haddix is a 81 y.o. female presenting with concern for NSTEMI. 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 Patient was not on [facility] PTA per chart review. She has troponin elevation with peak at 494. She has planned [facility] 11/13. Pharmacy has been consulted to initiate and manage heparin infusion.   Baseline Labs: Hgb 13.1, Hct 40.2, Plt 226; aPTT and PT/INR not taken before start of heparin infusion  Goal of Therapy:  Heparin level 0.3-0.7 units/ml Monitor platelets by anticoagulation protocol: Yes   Date Time HL Rate/Comment  11/11 0442 0.15 750/SUBtherapeutic 11/11 1457 0.29 1000/SUBtherapeutic  11/12   0104    0.55     1100/Therapeutic X 1   Plan:  11/12:  HL @ 0104 = 0.55, therapeutic X 1 Will continue pt on current rate and recheck HL on 11/12 @ 0900.   Kattaleya Alia D 07/13/2022 1:41 AM

## 2022-07-13 NOTE — Plan of Care (Signed)
  Problem: Education: Goal: Ability to describe self-care measures that may prevent or decrease complications (Diabetes Survival Skills Education) will improve Outcome: Progressing Goal: Individualized Educational Video(s) Outcome: Progressing   Problem: Coping: Goal: Ability to adjust to condition or change in health will improve Outcome: Progressing   Problem: Fluid Volume: Goal: Ability to maintain a balanced intake and output will improve Outcome: Progressing   Problem: Health Behavior/Discharge Planning: Goal: Ability to identify and utilize available resources and services will improve Outcome: Progressing Goal: Ability to manage health-related needs will improve Outcome: Progressing   Problem: Metabolic: Goal: Ability to maintain appropriate glucose levels will improve Outcome: Progressing   Problem: Nutritional: Goal: Maintenance of adequate nutrition will improve Outcome: Progressing Goal: Progress toward achieving an optimal weight will improve Outcome: Progressing   Problem: Skin Integrity: Goal: Risk for impaired skin integrity will decrease Outcome: Progressing   Problem: Tissue Perfusion: Goal: Adequacy of tissue perfusion will improve Outcome: Progressing   Problem: Education: Goal: Understanding of cardiac disease, CV risk reduction, and recovery process will improve Outcome: Progressing Goal: Individualized Educational Video(s) Outcome: Progressing   Problem: Activity: Goal: Ability to tolerate increased activity will improve Outcome: Progressing   Problem: Cardiac: Goal: Ability to achieve and maintain adequate cardiovascular perfusion will improve Outcome: Progressing   Problem: Health Behavior/Discharge Planning: Goal: Ability to safely manage health-related needs after discharge will improve Outcome: Progressing   Problem: Education: Goal: Knowledge of General Education information will improve Description: Including pain rating scale,  medication(s)/side effects and non-pharmacologic comfort measures Outcome: Progressing   Problem: Health Behavior/Discharge Planning: Goal: Ability to manage health-related needs will improve Outcome: Progressing   Problem: Clinical Measurements: Goal: Ability to maintain clinical measurements within normal limits will improve Outcome: Progressing Goal: Will remain free from infection Outcome: Progressing Goal: Diagnostic test results will improve Outcome: Progressing Goal: Respiratory complications will improve Outcome: Progressing Goal: Cardiovascular complication will be avoided Outcome: Progressing   Problem: Activity: Goal: Risk for activity intolerance will decrease Outcome: Progressing   Problem: Nutrition: Goal: Adequate nutrition will be maintained Outcome: Progressing   Problem: Coping: Goal: Level of anxiety will decrease Outcome: Progressing   Problem: Elimination: Goal: Will not experience complications related to bowel motility Outcome: Progressing Goal: Will not experience complications related to urinary retention Outcome: Progressing   Problem: Pain Managment: Goal: General experience of comfort will improve Outcome: Progressing   Problem: Safety: Goal: Ability to remain free from injury will improve Outcome: Progressing   Problem: Skin Integrity: Goal: Risk for impaired skin integrity will decrease Outcome: Progressing   

## 2022-07-13 NOTE — H&P (View-Only) (Signed)
Rounding Note    Patient Name: Yvonne Newton Date of Encounter: 07/13/2022  [facility] Cardiologist: Sharrell Ku  Subjective   Uneventful evening Denies chest pain or shortness of breath Remains on heparin infusion  Inpatient Medications    Scheduled Meds:  aspirin EC  81 mg Oral Daily   carvedilol  12.5 mg Oral BID   diltiazem  120 mg Oral Q48H   flecainide  50 mg Oral BID   furosemide  40 mg Intravenous Daily   insulin aspart  0-15 Units Subcutaneous Q4H   loratadine  10 mg Oral Daily   memantine  10 mg Oral BID   pantoprazole  40 mg Oral Daily   rosuvastatin  5 mg Oral q1800   Continuous Infusions:  sodium chloride 10 mL/hr at 07/12/22 2000   heparin 1,100 Units/hr (07/12/22 2000)   PRN Meds: sodium chloride, acetaminophen, nitroGLYCERIN, ondansetron **OR** ondansetron (ZOFRAN) IV   Vital Signs    Vitals:   07/13/22 0416 07/13/22 0830 07/13/22 0955 07/13/22 1144  BP: (!) 113/50 (!) 128/43 101/72 (!) 120/54  Pulse: 71 68  69  Resp: 15 (!) 23  19  Temp: 97.8 F (36.6 C) 97.9 F (36.6 C)  97.9 F (36.6 C)  TempSrc: Oral Oral  Oral  SpO2: 96% 100%  96%  Weight:      Height:        Intake/Output Summary (Last 24 hours) at 07/13/2022 1300 Last data filed at 07/12/2022 2000 Gross per 24 hour  Intake 331.07 ml  Output --  Net 331.07 ml      07/12/2022    5:00 AM 07/11/2022    8:08 PM 05/07/2022    2:33 PM  Last 3 Weights  Weight (lbs) 173 lb 12.8 oz 175 lb 6.4 oz 172 lb  Weight (kg) 78.835 kg 79.561 kg 78.019 kg      Telemetry    Normal sinus rhythm- Personally Reviewed  ECG     - Personally Reviewed  Physical Exam   GEN: No acute distress.   Neck: No JVD Cardiac: RRR, no murmurs, rubs, or gallops.  Respiratory: Clear to auscultation bilaterally. GI: Soft, nontender, non-distended  MS: No edema; No deformity. Neuro:  Nonfocal  Psych: Normal affect   Labs    High Sensitivity Troponin:   Recent Labs  Lab  07/11/22 1707 07/11/22 1922 07/11/22 2300 07/12/22 0045  TROPONINIHS 86* 259* 459* 494*     Chemistry Recent Labs  Lab 07/11/22 1707 07/13/22 0318 07/13/22 0852  NA 139 138  --   K 4.3 3.3*  --   CL 106 104  --   CO2 25 29  --   GLUCOSE 110* 136*  --   BUN 20 19  --   CREATININE 0.55 0.81  --   CALCIUM 9.4 8.4*  --   MG  --   --  1.7  PROT 7.3  --   --   ALBUMIN 4.1  --   --   AST 24  --   --   ALT 18  --   --   ALKPHOS 50  --   --   BILITOT 1.2  --   --   GFRNONAA >60 >60  --   ANIONGAP 8 5  --     Lipids No results for input(s): "CHOL", "TRIG", "HDL", "LABVLDL", "LDLCALC", "CHOLHDL" in the last 168 hours.  Hematology Recent Labs  Lab 07/11/22 1707 07/12/22 0442 07/13/22 0318  WBC 12.9* 14.7*  12.8*  RBC 4.62 4.64 4.37  HGB 13.1 13.0 12.2  HCT 40.2 40.1 38.0  MCV 87.0 86.4 87.0  MCH 28.4 28.0 27.9  MCHC 32.6 32.4 32.1  RDW 13.6 13.5 13.6  PLT 226 220 194   Thyroid No results for input(s): "TSH", "FREET4" in the last 168 hours.  BNP Recent Labs  Lab 07/11/22 1701 07/11/22 2300  BNP 55.7 119.9*    DDimer  Recent Labs  Lab 07/11/22 1707  DDIMER 0.55*     Radiology    CT Angio Chest PE W and/or Wo Contrast  Result Date: 07/11/2022 CLINICAL DATA:  Right arm pain, back pain, short of breath, positive D-dimer EXAM: CT ANGIOGRAPHY CHEST WITH CONTRAST TECHNIQUE: Multidetector CT imaging of the chest was performed using the standard protocol during bolus administration of intravenous contrast. Multiplanar CT image reconstructions and MIPs were obtained to evaluate the vascular anatomy. RADIATION DOSE REDUCTION: This exam was performed according to the departmental dose-optimization program which includes automated exposure control, adjustment of the mA and/or kV according to patient size and/or use of iterative reconstruction technique. CONTRAST:  77mL OMNIPAQUE IOHEXOL 350 MG/ML SOLN COMPARISON:  07/11/2022 FINDINGS: Cardiovascular: This is a technically  adequate evaluation of the pulmonary vasculature. No filling defects or pulmonary emboli. Mild cardiomegaly without pericardial effusion. Normal caliber of the thoracic aorta. Atherosclerosis of the aorta and coronary vasculature. Mediastinum/Nodes: No enlarged mediastinal, hilar, or axillary lymph nodes. Thyroid gland, trachea, and esophagus demonstrate no significant findings. Lungs/Pleura: There are scattered faint ground-glass opacity seen within the bilateral lower lobes, with associated bibasilar bronchial wall thickening, consistent with atypical pneumonia such as viral etiology. No effusion or pneumothorax. Upper Abdomen: Subtle nodularity of the liver capsule compatible with cirrhosis. No acute upper abdominal finding. Musculoskeletal: No acute or destructive bony lesions. Reconstructed images demonstrate no additional findings. Review of the MIP images confirms the above findings. IMPRESSION: 1. No evidence of pulmonary embolus. 2. Bibasilar bronchial wall thickening with faint scattered ground-glass airspace disease, compatible with atypical infection such as viral etiology. 3. Cardiomegaly. 4. Aortic Atherosclerosis (ICD10-I70.0). Coronary artery atherosclerosis. 5. Subtle nodularity of the liver capsule suggesting underlying cirrhosis. Electronically Signed   By: Sharlet Salina M.D.   On: 07/11/2022 19:01   DG Chest Portable 1 View  Result Date: 07/11/2022 CLINICAL DATA:  Shortness of breath EXAM: PORTABLE CHEST 1 VIEW COMPARISON:  05/07/2022 FINDINGS: Cardiomegaly with vascular congestion and mild diffuse interstitial opacity likely due to mild edema. No sizable effusion. Aortic atherosclerosis. IMPRESSION: Cardiomegaly with vascular congestion and mild diffuse interstitial opacity likely due to mild edema. Electronically Signed   By: Jasmine Pang M.D.   On: 07/11/2022 17:28    Cardiac Studies   Echocardiogram pending  Patient Profile     Yvonne Newton is a 81 y.o. 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 range Managed by primary care   Total encounter time more than 35 minutes  Greater than 50% was spent in counseling and coordination of care with the patient   For questions or updates, please contact [facility] Please consult [email or web address] for contact info under        Signed, Julien Nordmann, MD  07/13/2022, 1:00 PM      Patient seen on rounds this morning Full note to follow Denies chest pain concerning for angina Blood pressure and heart rate stable  No further medication changes made Plan for left heart catheterization tomorrow, tentatively scheduled around lunchtime  Signed, Dossie Arbour, MD, Ph.D [facility]

## 2022-07-13 NOTE — Progress Notes (Addendum)
PROGRESS NOTE    Yvonne Newton  [id number] DOB: 1940-09-12 DOA: 07/11/2022 PCP: Ellene Route  Outpatient Specialists: cardiology, neurology, endocrinology    Brief Narrative:   From admission h and p Yvonne Newton is a 81 y.o. female with medical history significant for NIDDM, HTN, G1 DD on echo 2019, with no history of CAD, cerebellar stroke in 2019, mild dementia and history of ventricular tachycardia with removal of implantable loop recorder 10/2021 with no events, currently on flecainide, who was brought to the ED by EMS after the onset of bilateral arm pain, upper back pain and chest pain starting while walking outside.  She had associated diaphoresis, shortness of breath and nausea.  History is taken from part from daughter over the phone who states that at baseline patient has chronic dyspnea with lower extremity edema and most nights gets up in the middle of the night to go sleep in a recliner.  She is not certain if this is for comfort or for shortness of breath.  She has had no cough, fever or chills.  She states her mother was previously on a fluid pill but no longer takes it.  Patient reports relief in her chest pain but continues to indicate lower anterior and mid back pain encircling her chest    Assessment & Plan:   Principal Problem:   NSTEMI (non-ST elevated myocardial infarction) ([place]) Active Problems:   Acute respiratory failure with hypoxia (HCC)   Acute on chronic diastolic CHF (congestive heart failure) (HCC)   Chest pain   OBESITY   Diabetes mellitus type 2 with complications (HCC)   Essential hypertension   VT (ventricular tachycardia) (HCC)   History of cerebellar stroke 2019   History of ventricular tachycardia   Dementia without behavioral disturbance ([place])   Coronary artery disease involving native coronary artery of native heart with unstable angina pectoris ([place])  # NSTEMI Presenting with chest pain, TWIs on EKG, and troponin  elevation 494 at last check. Currently chest pain free. Received aspirin - continue heparin - cath tomorrow - cardiology consulted  - nitro prn - TTE read pending  # HFpEF Normal EF on TTE several years ago. Here mildly elevated bnp, lower extremity edema though this may be chronic. ACS likely causatory - continue lasix 40 iv qd - TTE pending  # Acute respiratory failure No documented hypoxia but reportedly dyspneic on arrival. Now weaned off o2, breathing comfortably  # Dementia May also have some in-hospital delirium. Confusion somewhat improved today - monitor - home memantine  # Hx v-tach - cont home flecainide  # Hx CVA - cont home asa, statin  # Hypokalemia # Hypomagnesemia Mild - replete and monitor  # HTN BP mildly elevated - cont home dilt and coreg  # T2DM Glucose appropriate - SSI for home glipizide and metformin    DVT prophylaxis: heparin gtt Code Status: full Family Communication: daughters updated @ bedside 11/12  Level of care: Progressive Status is: Inpatient Remains inpatient appropriate because: severity of illness    Consultants:  cardiology  Procedures: pending  Antimicrobials:  none    Subjective: Chest pain resolved. No sob. Has appetite  Objective: Vitals:   07/13/22 0416 07/13/22 0830 07/13/22 0955 07/13/22 1144  BP: (!) 113/50 (!) 128/43 101/72 (!) 120/54  Pulse: 71 68  69  Resp: 15 (!) 23  19  Temp: 97.8 F (36.6 C) 97.9 F (36.6 C)  97.9 F (36.6 C)  TempSrc: Oral Oral  Oral  SpO2:  96% 100%  96%  Weight:      Height:        Intake/Output Summary (Last 24 hours) at 07/13/2022 1320 Last data filed at 07/12/2022 2000 Gross per 24 hour  Intake 331.07 ml  Output --  Net 331.07 ml   Filed Weights   07/11/22 2008 07/12/22 0500  Weight: 79.6 kg 78.8 kg    Examination:  General exam: Appears calm and comfortable  Respiratory system: Clear to auscultation save for rales at bases Cardiovascular system: S1 &  S2 heard, RRR.   Gastrointestinal system: Abdomen is nondistended, soft and nontender. No organomegaly or masses felt. Normal bowel sounds heard. Central nervous system: Alert and oriented to self. Confused. Moving all 4  Extremities: Symmetric 5 x 5 power. Pitting edema to knees Skin: No rashes, lesions or ulcers Psychiatry: confused, calm    Data Reviewed: I have personally reviewed following labs and imaging studies  CBC: Recent Labs  Lab 07/11/22 1707 07/12/22 0442 07/13/22 0318  WBC 12.9* 14.7* 12.8*  NEUTROABS 7.5  --   --   HGB 13.1 13.0 12.2  HCT 40.2 40.1 38.0  MCV 87.0 86.4 87.0  PLT 226 220 [id number]   Basic Metabolic Panel: Recent Labs  Lab 07/11/22 1707 07/13/22 0318 07/13/22 0852  NA 139 138  --   K 4.3 3.3*  --   CL 106 104  --   CO2 25 29  --   GLUCOSE 110* 136*  --   BUN 20 19  --   CREATININE 0.55 0.81  --   CALCIUM 9.4 8.4*  --   MG  --   --  1.7   GFR: Estimated Creatinine Clearance: 50.6 mL/min (by C-G formula based on SCr of 0.81 mg/dL). Liver Function Tests: Recent Labs  Lab 07/11/22 1707  AST 24  ALT 18  ALKPHOS 50  BILITOT 1.2  PROT 7.3  ALBUMIN 4.1   No results for input(s): "LIPASE", "AMYLASE" in the last 168 hours. No results for input(s): "AMMONIA" in the last 168 hours. Coagulation Profile: Recent Labs  Lab 07/11/22 2133  INR 1.1   Cardiac Enzymes: No results for input(s): "CKTOTAL", "CKMB", "CKMBINDEX", "TROPONINI" in the last 168 hours. BNP (last 3 results) No results for input(s): "PROBNP" in the last 8760 hours. HbA1C: Recent Labs    07/11/22 2133  HGBA1C 6.3*   CBG: Recent Labs  Lab 07/12/22 1635 07/12/22 2302 07/13/22 0421 07/13/22 0829 07/13/22 1143  GLUCAP 154* 136* 131* 116* 166*   Lipid Profile: No results for input(s): "CHOL", "HDL", "LDLCALC", "TRIG", "CHOLHDL", "LDLDIRECT" in the last 72 hours. Thyroid Function Tests: No results for input(s): "TSH", "T4TOTAL", "FREET4", "T3FREE", "THYROIDAB" in  the last 72 hours. Anemia Panel: No results for input(s): "VITAMINB12", "FOLATE", "FERRITIN", "TIBC", "IRON", "RETICCTPCT" in the last 72 hours. Urine analysis:    Component Value Date/Time   COLORURINE YELLOW (A) 07/12/2022 0010   APPEARANCEUR CLOUDY (A) 07/12/2022 0010   LABSPEC 1.016 07/12/2022 0010   PHURINE 5.0 07/12/2022 0010   GLUCOSEU NEGATIVE 07/12/2022 0010   HGBUR NEGATIVE 07/12/2022 0010   BILIRUBINUR NEGATIVE 07/12/2022 0010   KETONESUR NEGATIVE 07/12/2022 0010   PROTEINUR NEGATIVE 07/12/2022 0010   NITRITE NEGATIVE 07/12/2022 0010   LEUKOCYTESUR NEGATIVE 07/12/2022 0010   Sepsis Labs: @LABRCNTIP (procalcitonin:4,lacticidven:4)  ) Recent Results (from the past 240 hour(s))  Resp Panel by RT-PCR (Flu A&B, Covid) Anterior Nasal Swab     Status: None   Collection Time: 07/11/22  7:40 PM  Specimen: Anterior Nasal Swab  Result Value Ref Range Status   SARS Coronavirus 2 by RT PCR NEGATIVE NEGATIVE Final    Comment: (NOTE) SARS-CoV-2 target nucleic acids are NOT DETECTED.  The SARS-CoV-2 RNA is generally detectable in upper respiratory specimens during the acute phase of infection. The lowest concentration of SARS-CoV-2 viral copies this assay can detect is 138 copies/mL. A negative result does not preclude SARS-Cov-2 infection and should not be used as the sole basis for treatment or other patient management decisions. A negative result may occur with  improper specimen collection/handling, submission of specimen other than nasopharyngeal swab, presence of viral mutation(s) within the areas targeted by this assay, and inadequate number of viral copies(<138 copies/mL). A negative result must be combined with clinical observations, patient history, and epidemiological information. The expected result is Negative.  Fact Sheet for Patients:  BloggerCourse.com  Fact Sheet for Healthcare Providers:   SeriousBroker.it  This test is no t yet approved or cleared by the [place] FDA and  has been authorized for detection and/or diagnosis of SARS-CoV-2 by FDA under an Emergency Use Authorization (EUA). This EUA will remain  in effect (meaning this test can be used) for the duration of the COVID-19 declaration under Section 564(b)(1) of the Act, 21 U.S.C.section 360bbb-3(b)(1), unless the authorization is terminated  or revoked sooner.       Influenza A by PCR NEGATIVE NEGATIVE Final   Influenza B by PCR NEGATIVE NEGATIVE Final    Comment: (NOTE) The Xpert Xpress SARS-CoV-2/FLU/RSV plus assay is intended as an aid in the diagnosis of influenza from Nasopharyngeal swab specimens and should not be used as a sole basis for treatment. Nasal washings and aspirates are unacceptable for Xpert Xpress SARS-CoV-2/FLU/RSV testing.  Fact Sheet for Patients: BloggerCourse.com  Fact Sheet for Healthcare Providers: SeriousBroker.it  This test is not yet approved or cleared by the [place] FDA and has been authorized for detection and/or diagnosis of SARS-CoV-2 by FDA under an Emergency Use Authorization (EUA). This EUA will remain in effect (meaning this test can be used) for the duration of the COVID-19 declaration under Section 564(b)(1) of the Act, 21 U.S.C. section 360bbb-3(b)(1), unless the authorization is terminated or revoked.  Performed at [facility], [address] [place], [name] [address]          Radiology Studies: CT Angio Chest PE W and/or Wo Contrast  Result Date: 07/11/2022 CLINICAL DATA:  Right arm pain, back pain, short of breath, positive D-dimer EXAM: CT ANGIOGRAPHY CHEST WITH CONTRAST TECHNIQUE: Multidetector CT imaging of the chest was performed using the standard protocol during bolus administration of intravenous contrast. Multiplanar CT image reconstructions and  MIPs were obtained to evaluate the vascular anatomy. RADIATION DOSE REDUCTION: This exam was performed according to the departmental dose-optimization program which includes automated exposure control, adjustment of the mA and/or kV according to patient size and/or use of iterative reconstruction technique. CONTRAST:  39mL OMNIPAQUE IOHEXOL 350 MG/ML SOLN COMPARISON:  07/11/2022 FINDINGS: Cardiovascular: This is a technically adequate evaluation of the pulmonary vasculature. No filling defects or pulmonary emboli. Mild cardiomegaly without pericardial effusion. Normal caliber of the thoracic aorta. Atherosclerosis of the aorta and coronary vasculature. Mediastinum/Nodes: No enlarged mediastinal, hilar, or axillary lymph nodes. Thyroid gland, trachea, and esophagus demonstrate no significant findings. Lungs/Pleura: There are scattered faint ground-glass opacity seen within the bilateral lower lobes, with associated bibasilar bronchial wall thickening, consistent with atypical pneumonia such as viral etiology. No effusion or pneumothorax. Upper Abdomen:  Subtle nodularity of the liver capsule compatible with cirrhosis. No acute upper abdominal finding. Musculoskeletal: No acute or destructive bony lesions. Reconstructed images demonstrate no additional findings. Review of the MIP images confirms the above findings. IMPRESSION: 1. No evidence of pulmonary embolus. 2. Bibasilar bronchial wall thickening with faint scattered ground-glass airspace disease, compatible with atypical infection such as viral etiology. 3. Cardiomegaly. 4. Aortic Atherosclerosis (ICD10-I70.0). Coronary artery atherosclerosis. 5. Subtle nodularity of the liver capsule suggesting underlying cirrhosis. Electronically Signed   By: Randa Ngo M.D.   On: 07/11/2022 19:01   DG Chest Portable 1 View  Result Date: 07/11/2022 CLINICAL DATA:  Shortness of breath EXAM: PORTABLE CHEST 1 VIEW COMPARISON:  05/07/2022 FINDINGS: Cardiomegaly with  vascular congestion and mild diffuse interstitial opacity likely due to mild edema. No sizable effusion. Aortic atherosclerosis. IMPRESSION: Cardiomegaly with vascular congestion and mild diffuse interstitial opacity likely due to mild edema. Electronically Signed   By: Donavan Foil M.D.   On: 07/11/2022 17:28        Scheduled Meds:  aspirin EC  81 mg Oral Daily   carvedilol  12.5 mg Oral BID   diltiazem  120 mg Oral Q48H   flecainide  50 mg Oral BID   furosemide  40 mg Intravenous Daily   insulin aspart  0-15 Units Subcutaneous Q4H   loratadine  10 mg Oral Daily   memantine  10 mg Oral BID   pantoprazole  40 mg Oral Daily   rosuvastatin  5 mg Oral q1800   Continuous Infusions:  sodium chloride 10 mL/hr at 07/12/22 2000   heparin 1,100 Units/hr (07/12/22 2000)   magnesium sulfate bolus IVPB       LOS: 2 days     Desma Maxim, MD [facility]   If 7PM-7AM, please contact night-coverage [email or web address] Password [email or web address] 07/13/2022, 1:20 PM

## 2022-07-13 NOTE — Progress Notes (Addendum)
 Rounding Note    Patient Name: Yvonne Newton Date of Encounter: 07/13/2022  [facility] Cardiologist: Greg Taylor  Subjective   Uneventful evening Denies chest pain or shortness of breath Remains on heparin infusion  Inpatient Medications    Scheduled Meds:  aspirin EC  81 mg Oral Daily   carvedilol  12.5 mg Oral BID   diltiazem  120 mg Oral Q48H   flecainide  50 mg Oral BID   furosemide  40 mg Intravenous Daily   insulin aspart  0-15 Units Subcutaneous Q4H   loratadine  10 mg Oral Daily   memantine  10 mg Oral BID   pantoprazole  40 mg Oral Daily   rosuvastatin  5 mg Oral q1800   Continuous Infusions:  sodium chloride 10 mL/hr at 07/12/22 2000   heparin 1,100 Units/hr (07/12/22 2000)   PRN Meds: sodium chloride, acetaminophen, nitroGLYCERIN, ondansetron **OR** ondansetron (ZOFRAN) IV   Vital Signs    Vitals:   07/13/22 0416 07/13/22 0830 07/13/22 0955 07/13/22 1144  BP: (!) 113/50 (!) 128/43 101/72 (!) 120/54  Pulse: 71 68  69  Resp: 15 (!) 23  19  Temp: 97.8 F (36.6 C) 97.9 F (36.6 C)  97.9 F (36.6 C)  TempSrc: Oral Oral  Oral  SpO2: 96% 100%  96%  Weight:      Height:        Intake/Output Summary (Last 24 hours) at 07/13/2022 1300 Last data filed at 07/12/2022 2000 Gross per 24 hour  Intake 331.07 ml  Output --  Net 331.07 ml      07/12/2022    5:00 AM 07/11/2022    8:08 PM 05/07/2022    2:33 PM  Last 3 Weights  Weight (lbs) 173 lb 12.8 oz 175 lb 6.4 oz 172 lb  Weight (kg) 78.835 kg 79.561 kg 78.019 kg      Telemetry    Normal sinus rhythm- Personally Reviewed  ECG     - Personally Reviewed  Physical Exam   GEN: No acute distress.   Neck: No JVD Cardiac: RRR, no murmurs, rubs, or gallops.  Respiratory: Clear to auscultation bilaterally. GI: Soft, nontender, non-distended  MS: No edema; No deformity. Neuro:  Nonfocal  Psych: Normal affect   Labs    High Sensitivity Troponin:   Recent Labs  Lab  07/11/22 1707 07/11/22 1922 07/11/22 2300 07/12/22 0045  TROPONINIHS 86* 259* 459* 494*     Chemistry Recent Labs  Lab 07/11/22 1707 07/13/22 0318 07/13/22 0852  NA 139 138  --   K 4.3 3.3*  --   CL 106 104  --   CO2 25 29  --   GLUCOSE 110* 136*  --   BUN 20 19  --   CREATININE 0.55 0.81  --   CALCIUM 9.4 8.4*  --   MG  --   --  1.7  PROT 7.3  --   --   ALBUMIN 4.1  --   --   AST 24  --   --   ALT 18  --   --   ALKPHOS 50  --   --   BILITOT 1.2  --   --   GFRNONAA >60 >60  --   ANIONGAP 8 5  --     Lipids No results for input(s): "CHOL", "TRIG", "HDL", "LABVLDL", "LDLCALC", "CHOLHDL" in the last 168 hours.  Hematology Recent Labs  Lab 07/11/22 1707 07/12/22 0442 07/13/22 0318  WBC 12.9* 14.7*   12.8*  RBC 4.62 4.64 4.37  HGB 13.1 13.0 12.2  HCT 40.2 40.1 38.0  MCV 87.0 86.4 87.0  MCH 28.4 28.0 27.9  MCHC 32.6 32.4 32.1  RDW 13.6 13.5 13.6  PLT 226 220 194   Thyroid No results for input(s): "TSH", "FREET4" in the last 168 hours.  BNP Recent Labs  Lab 07/11/22 1701 07/11/22 2300  BNP 55.7 119.9*    DDimer  Recent Labs  Lab 07/11/22 1707  DDIMER 0.55*     Radiology    CT Angio Chest PE W and/or Wo Contrast  Result Date: 07/11/2022 CLINICAL DATA:  Right arm pain, back pain, short of breath, positive D-dimer EXAM: CT ANGIOGRAPHY CHEST WITH CONTRAST TECHNIQUE: Multidetector CT imaging of the chest was performed using the standard protocol during bolus administration of intravenous contrast. Multiplanar CT image reconstructions and MIPs were obtained to evaluate the vascular anatomy. RADIATION DOSE REDUCTION: This exam was performed according to the departmental dose-optimization program which includes automated exposure control, adjustment of the mA and/or kV according to patient size and/or use of iterative reconstruction technique. CONTRAST:  77mL OMNIPAQUE IOHEXOL 350 MG/ML SOLN COMPARISON:  07/11/2022 FINDINGS: Cardiovascular: This is a technically  adequate evaluation of the pulmonary vasculature. No filling defects or pulmonary emboli. Mild cardiomegaly without pericardial effusion. Normal caliber of the thoracic aorta. Atherosclerosis of the aorta and coronary vasculature. Mediastinum/Nodes: No enlarged mediastinal, hilar, or axillary lymph nodes. Thyroid gland, trachea, and esophagus demonstrate no significant findings. Lungs/Pleura: There are scattered faint ground-glass opacity seen within the bilateral lower lobes, with associated bibasilar bronchial wall thickening, consistent with atypical pneumonia such as viral etiology. No effusion or pneumothorax. Upper Abdomen: Subtle nodularity of the liver capsule compatible with cirrhosis. No acute upper abdominal finding. Musculoskeletal: No acute or destructive bony lesions. Reconstructed images demonstrate no additional findings. Review of the MIP images confirms the above findings. IMPRESSION: 1. No evidence of pulmonary embolus. 2. Bibasilar bronchial wall thickening with faint scattered ground-glass airspace disease, compatible with atypical infection such as viral etiology. 3. Cardiomegaly. 4. Aortic Atherosclerosis (ICD10-I70.0). Coronary artery atherosclerosis. 5. Subtle nodularity of the liver capsule suggesting underlying cirrhosis. Electronically Signed   By: Sharlet Salina M.D.   On: 07/11/2022 19:01   DG Chest Portable 1 View  Result Date: 07/11/2022 CLINICAL DATA:  Shortness of breath EXAM: PORTABLE CHEST 1 VIEW COMPARISON:  05/07/2022 FINDINGS: Cardiomegaly with vascular congestion and mild diffuse interstitial opacity likely due to mild edema. No sizable effusion. Aortic atherosclerosis. IMPRESSION: Cardiomegaly with vascular congestion and mild diffuse interstitial opacity likely due to mild edema. Electronically Signed   By: Jasmine Pang M.D.   On: 07/11/2022 17:28    Cardiac Studies   Echocardiogram pending  Patient Profile     Yvonne Newton is a 81 y.o. 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 range Managed by primary care   Total encounter time more than 35 minutes  Greater than 50% was spent in counseling and coordination of care with the patient   For questions or updates, please contact [facility] Please consult [email or web address] for contact info under        Signed, Jamilyn Pigeon, MD  07/13/2022, 1:00 PM      Patient seen on rounds this morning Full note to follow Denies chest pain concerning for angina Blood pressure and heart rate stable  No further medication changes made Plan for left heart catheterization tomorrow, tentatively scheduled around lunchtime  Signed, Tim Verdene Creson, MD, Ph.D [facility]  

## 2022-07-14 ENCOUNTER — Encounter
Admission: EM | Disposition: A | Discharge status: Home / Self Care | Payer: Self-pay | Source: Home / Self Care | Attending: Obstetrics and Gynecology

## 2022-07-14 DIAGNOSIS — I5181 Takotsubo syndrome: Secondary | ICD-10-CM | POA: Insufficient documentation

## 2022-07-14 DIAGNOSIS — I5021 Acute systolic (congestive) heart failure: Secondary | ICD-10-CM

## 2022-07-14 DIAGNOSIS — I2510 Atherosclerotic heart disease of native coronary artery without angina pectoris: Secondary | ICD-10-CM

## 2022-07-14 DIAGNOSIS — I214 Non-ST elevation (NSTEMI) myocardial infarction: Secondary | ICD-10-CM | POA: Diagnosis not present

## 2022-07-14 HISTORY — PX: LEFT HEART CATH AND CORONARY ANGIOGRAPHY: CATH118249

## 2022-07-14 HISTORY — PX: INTRAVASCULAR PRESSURE WIRE/FFR STUDY: CATH118243

## 2022-07-14 LAB — CBC
HCT: 36.2 % (ref 36.0–46.0)
Hemoglobin: 11.6 g/dL — ABNORMAL LOW (ref 12.0–15.0)
MCH: 28.3 pg (ref 26.0–34.0)
MCHC: 32 g/dL (ref 30.0–36.0)
MCV: 88.3 fL (ref 80.0–100.0)
Platelets: 175 10*3/uL (ref 150–400)
RBC: 4.1 MIL/uL (ref 3.87–5.11)
RDW: 13.7 % (ref 11.5–15.5)
WBC: 10 10*3/uL (ref 4.0–10.5)
nRBC: 0 % (ref 0.0–0.2)

## 2022-07-14 LAB — BASIC METABOLIC PANEL
Anion gap: 9 (ref 5–15)
BUN: 27 mg/dL — ABNORMAL HIGH (ref 8–23)
CO2: 26 mmol/L (ref 22–32)
Calcium: 9.4 mg/dL (ref 8.9–10.3)
Chloride: 105 mmol/L (ref 98–111)
Creatinine, Ser: 0.85 mg/dL (ref 0.44–1.00)
GFR, Estimated: >60 mL/min (ref >60)
Glucose, Bld: 138 mg/dL — ABNORMAL HIGH (ref 70–99)
Potassium: 3.8 mmol/L (ref 3.5–5.1)
Sodium: 140 mmol/L (ref 135–145)

## 2022-07-14 LAB — GLUCOSE, CAPILLARY
Glucose-Capillary: 115 mg/dL — ABNORMAL HIGH (ref 70–99)
Glucose-Capillary: 117 mg/dL — ABNORMAL HIGH (ref 70–99)
Glucose-Capillary: 125 mg/dL — ABNORMAL HIGH (ref 70–99)
Glucose-Capillary: 128 mg/dL — ABNORMAL HIGH (ref 70–99)
Glucose-Capillary: 132 mg/dL — ABNORMAL HIGH (ref 70–99)
Glucose-Capillary: 140 mg/dL — ABNORMAL HIGH (ref 70–99)
Glucose-Capillary: 147 mg/dL — ABNORMAL HIGH (ref 70–99)
Glucose-Capillary: 163 mg/dL — ABNORMAL HIGH (ref 70–99)
Glucose-Capillary: 96 mg/dL (ref 70–99)

## 2022-07-14 LAB — HEPARIN LEVEL (UNFRACTIONATED): Heparin Unfractionated: 0.49 IU/mL (ref 0.30–0.70)

## 2022-07-14 LAB — MAGNESIUM: Magnesium: 2.3 mg/dL (ref 1.7–2.4)

## 2022-07-14 SURGERY — LEFT HEART CATH AND CORONARY ANGIOGRAPHY
Anesthesia: Moderate Sedation

## 2022-07-14 MED ORDER — HEPARIN SODIUM (PORCINE) 1000 UNIT/ML IJ SOLN
INTRAMUSCULAR | Status: AC
  Filled 2022-07-14: qty 10

## 2022-07-14 MED ORDER — SODIUM CHLORIDE 0.9% FLUSH
3.0000 mL | Freq: Two times a day (BID) | INTRAVENOUS | Status: DC
  Administered 2022-07-14 – 2022-07-15 (×2): 3 mL via INTRAVENOUS

## 2022-07-14 MED ORDER — SODIUM CHLORIDE 0.9% FLUSH: 3.0000 mL | INTRAVENOUS | Status: DC | PRN

## 2022-07-14 MED ORDER — CLOPIDOGREL BISULFATE 75 MG PO TABS
75.0000 mg | ORAL_TABLET | Freq: Every day | ORAL | Status: DC
  Administered 2022-07-15: 75 mg via ORAL
  Filled 2022-07-14: qty 1

## 2022-07-14 MED ORDER — SODIUM CHLORIDE 0.9 % WEIGHT BASED INFUSION: 1.0000 mL/kg/h | INTRAVENOUS | Status: DC

## 2022-07-14 MED ORDER — FUROSEMIDE 40 MG PO TABS
40.0000 mg | ORAL_TABLET | Freq: Every day | ORAL | Status: DC
  Administered 2022-07-14 – 2022-07-15 (×2): 40 mg via ORAL
  Filled 2022-07-14 (×2): qty 1

## 2022-07-14 MED ORDER — HEPARIN SODIUM (PORCINE) 1000 UNIT/ML IJ SOLN
INTRAMUSCULAR | Status: DC | PRN
  Administered 2022-07-14 (×2): 4000 [IU] via INTRAVENOUS

## 2022-07-14 MED ORDER — LIDOCAINE HCL (PF) 1 % IJ SOLN
INTRAMUSCULAR | Status: DC | PRN
  Administered 2022-07-14: 2 mL

## 2022-07-14 MED ORDER — LABETALOL HCL 5 MG/ML IV SOLN: 10.0000 mg | INTRAVENOUS | Status: AC | PRN

## 2022-07-14 MED ORDER — MIDAZOLAM HCL 2 MG/2ML IJ SOLN
INTRAMUSCULAR | Status: AC
  Filled 2022-07-14: qty 2

## 2022-07-14 MED ORDER — HEPARIN (PORCINE) IN NACL 1000-0.9 UT/500ML-% IV SOLN
INTRAVENOUS | Status: DC | PRN
  Administered 2022-07-14 (×2): 500 mL

## 2022-07-14 MED ORDER — LIDOCAINE HCL 1 % IJ SOLN
INTRAMUSCULAR | Status: AC
  Filled 2022-07-14: qty 20

## 2022-07-14 MED ORDER — SODIUM CHLORIDE 0.9 % IV SOLN: 250.0000 mL | INTRAVENOUS | Status: DC | PRN

## 2022-07-14 MED ORDER — IOHEXOL 300 MG/ML  SOLN
INTRAMUSCULAR | Status: DC | PRN
  Administered 2022-07-14: 57 mL

## 2022-07-14 MED ORDER — LOSARTAN POTASSIUM 25 MG PO TABS
25.0000 mg | ORAL_TABLET | Freq: Every day | ORAL | Status: DC
  Administered 2022-07-14 – 2022-07-15 (×2): 25 mg via ORAL
  Filled 2022-07-14 (×2): qty 1

## 2022-07-14 MED ORDER — MIDAZOLAM HCL 2 MG/2ML IJ SOLN
INTRAMUSCULAR | Status: DC | PRN
  Administered 2022-07-14: 1 mg via INTRAVENOUS

## 2022-07-14 MED ORDER — ASPIRIN 81 MG PO CHEW: 81.0000 mg | CHEWABLE_TABLET | ORAL | Status: DC

## 2022-07-14 MED ORDER — HEPARIN (PORCINE) IN NACL 1000-0.9 UT/500ML-% IV SOLN
INTRAVENOUS | Status: AC
  Filled 2022-07-14: qty 1000

## 2022-07-14 MED ORDER — VERAPAMIL HCL 2.5 MG/ML IV SOLN
INTRAVENOUS | Status: DC | PRN
  Administered 2022-07-14: 2.5 mg via INTRA_ARTERIAL

## 2022-07-14 MED ORDER — VERAPAMIL HCL 2.5 MG/ML IV SOLN
INTRAVENOUS | Status: AC
  Filled 2022-07-14: qty 2

## 2022-07-14 MED ORDER — SODIUM CHLORIDE 0.9 % WEIGHT BASED INFUSION: 3.0000 mL/kg/h | INTRAVENOUS | Status: DC

## 2022-07-14 SURGICAL SUPPLY — 13 items
CATH INFINITI 5FR JK (CATHETERS) IMPLANT
CATH LAUNCHER 6FR EBU3.5 (CATHETERS) IMPLANT
DEVICE RAD COMP TR BAND LRG (VASCULAR PRODUCTS) IMPLANT
DRAPE BRACHIAL (DRAPES) IMPLANT
GLIDESHEATH SLEND SS 6F .021 (SHEATH) IMPLANT
GUIDEWIRE INQWIRE 1.5J.035X260 (WIRE) IMPLANT
GUIDEWIRE PRESSURE X 175 (WIRE) IMPLANT
INQWIRE 1.5J .035X260CM (WIRE) ×1
PACK CARDIAC CATH (CUSTOM PROCEDURE TRAY) ×1 IMPLANT
PROTECTION STATION PRESSURIZED (MISCELLANEOUS) ×1
SET ATX SIMPLICITY (MISCELLANEOUS) IMPLANT
STATION PROTECTION PRESSURIZED (MISCELLANEOUS) IMPLANT
TUBING CIL FLEX 10 FLL-RA (TUBING) IMPLANT

## 2022-07-14 NOTE — Care Management Important Message (Signed)
Important Message  Patient Details  Name: Yvonne Newton MRN: [id number] Date of Birth: 1940-10-11   Medicare Important Message Given:  Yes     [name] 07/14/2022, 12:11 PM

## 2022-07-14 NOTE — Interval H&P Note (Signed)
History and Physical Interval Note:  07/14/2022 9:01 AM  Yvonne Newton  has presented today for surgery, with the diagnosis of Non-STEMI.  The various methods of treatment have been discussed with the patient and family. After consideration of risks, benefits and other options for treatment, the patient has consented to  Procedure(s): LEFT HEART CATH AND CORONARY ANGIOGRAPHY (N/A) as a surgical intervention.  The patient's history has been reviewed, patient examined, no change in status, stable for surgery.  I have reviewed the patient's chart and labs.  Questions were answered to the patient's satisfaction.     Lorine Bears

## 2022-07-14 NOTE — Progress Notes (Signed)
PROGRESS NOTE    Yvonne Newton  PNT:[id number] DOB: 1940-12-05 DOA: 07/11/2022 PCP: Nira Retort  Outpatient Specialists: cardiology, neurology, endocrinology    Brief Narrative:   From admission h and p Yvonne Newton is a 81 y.o. female with medical history significant for NIDDM, HTN, G1 DD on echo 2019, with no history of CAD, cerebellar stroke in 2019, mild dementia and history of ventricular tachycardia with removal of implantable loop recorder 10/2021 with no events, currently on flecainide, who was brought to the ED by EMS after the onset of bilateral arm pain, upper back pain and chest pain starting while walking outside.  She had associated diaphoresis, shortness of breath and nausea.  History is taken from part from daughter over the phone who states that at baseline patient has chronic dyspnea with lower extremity edema and most nights gets up in the middle of the night to go sleep in a recliner.  She is not certain if this is for comfort or for shortness of breath.  She has had no cough, fever or chills.  She states her mother was previously on a fluid pill but no longer takes it.  Patient reports relief in her chest pain but continues to indicate lower anterior and mid back pain encircling her chest    Assessment & Plan:   Principal Problem:   NSTEMI (non-ST elevated myocardial infarction) (HCC) Active Problems:   Acute respiratory failure with hypoxia (HCC)   Acute on chronic diastolic CHF (congestive heart failure) (HCC)   Chest pain   OBESITY   Diabetes mellitus type 2 with complications (HCC)   Essential hypertension   VT (ventricular tachycardia) (HCC)   History of cerebellar stroke 2019   History of ventricular tachycardia   Dementia without behavioral disturbance (HCC)   Coronary artery disease involving native coronary artery of native heart with unstable angina pectoris (HCC)   Stress-induced cardiomyopathy  # NSTEMI # CAD Presenting  with chest pain, TWIs on EKG, and troponin elevation 494 at last check. Currently chest pain free. Received aspirin. LHC today with non-obstructive CAD. Heparin discontinued - continue aspirin and rosuvastatin, plavix added - PT eval, likely d/c tomorrow  # HFpEF # Stress-induced cardiomyopathy Normal EF on TTE several years ago. Here mildly elevated bnp, lower extremity edema though this may be chronic. TTE here with rebional wall motion abnormalities of the LV not explained by cath findings, hence thinking this is stress-induced cardiomyopathy. - lasix transitioned to oral - cardiology following - losartan added  # Acute respiratory failure No documented hypoxia but reportedly dyspneic on arrival. Now weaned off o2, breathing comfortably  # Dementia May also have some in-hospital delirium. Confusion improved today - monitor - home memantine  # Hx v-tach - home flecainide discontinued given presence of CAD, cardiology advises alternate agent such as amio should problem arise again  # Hx CVA - cont home asa, statin; plavix added as above  # Hypokalemia # Hypomagnesemia Mild - replete and monitor  # HTN BP wnl - cont home dilt and coreg  # T2DM Glucose appropriate - SSI for home glipizide and metformin    DVT prophylaxis: heparin gtt Code Status: full Family Communication: son updated @ bedside 11/13  Level of care: Progressive Status is: Inpatient Remains inpatient appropriate because: needs overnight monitoring    Consultants:  cardiology  Procedures: pending  Antimicrobials:  none    Subjective: Chest pain resolved. No sob. Has appetite  Objective: Vitals:   07/14/22 1215 07/14/22 1230  07/14/22 1245 07/14/22 1300  BP: (!) 126/47 121/71 (!) 122/39 (!) 124/57  Pulse: 69 74 78 80  Resp: 20 16 18 18   Temp:      TempSrc:      SpO2: 96% 95% 96% 96%  Weight:      Height:        Intake/Output Summary (Last 24 hours) at 07/14/2022 1359 Last data  filed at 07/14/2022 0551 Gross per 24 hour  Intake 887.91 ml  Output --  Net 887.91 ml   Filed Weights   07/11/22 2008 07/12/22 0500  Weight: 79.6 kg 78.8 kg    Examination:  General exam: Appears calm and comfortable  Respiratory system: Clear to auscultation save for rales at bases Cardiovascular system: S1 & S2 heard, RRR.   Gastrointestinal system: Abdomen is nondistended, soft and nontender. No organomegaly or masses felt. Normal bowel sounds heard. Central nervous system: Alert and oriented to self. Confused. Moving all 4  Extremities: Symmetric 5 x 5 power. Pitting edema to knees Skin: No rashes, lesions or ulcers Psychiatry: confused, calm    Data Reviewed: I have personally reviewed following labs and imaging studies  CBC: Recent Labs  Lab 07/11/22 1707 07/12/22 0442 07/13/22 0318 07/14/22 0422  WBC 12.9* 14.7* 12.8* 10.0  NEUTROABS 7.5  --   --   --   HGB 13.1 13.0 12.2 11.6*  HCT 40.2 40.1 38.0 36.2  MCV 87.0 86.4 87.0 88.3  PLT 226 220 194 175   Basic Metabolic Panel: Recent Labs  Lab 07/11/22 1707 07/13/22 0318 07/13/22 0852 07/14/22 0422  NA 139 138  --  140  K 4.3 3.3*  --  3.8  CL 106 104  --  105  CO2 25 29  --  26  GLUCOSE 110* 136*  --  138*  BUN 20 19  --  27*  CREATININE 0.55 0.81  --  0.85  CALCIUM 9.4 8.4*  --  9.4  MG  --   --  1.7 2.3   GFR: Estimated Creatinine Clearance: 48.2 mL/min (by C-G formula based on SCr of 0.85 mg/dL). Liver Function Tests: Recent Labs  Lab 07/11/22 1707  AST 24  ALT 18  ALKPHOS 50  BILITOT 1.2  PROT 7.3  ALBUMIN 4.1   No results for input(s): "LIPASE", "AMYLASE" in the last 168 hours. No results for input(s): "AMMONIA" in the last 168 hours. Coagulation Profile: Recent Labs  Lab 07/11/22 2133  INR 1.1   Cardiac Enzymes: No results for input(s): "CKTOTAL", "CKMB", "CKMBINDEX", "TROPONINI" in the last 168 hours. BNP (last 3 results) No results for input(s): "PROBNP" in the last 8760  hours. HbA1C: Recent Labs    07/11/22 2133  HGBA1C 6.3*   CBG: Recent Labs  Lab 07/14/22 0039 07/14/22 0352 07/14/22 0733 07/14/22 0821 07/14/22 0957  GLUCAP 132* 147* 115* 96 140*   Lipid Profile: No results for input(s): "CHOL", "HDL", "LDLCALC", "TRIG", "CHOLHDL", "LDLDIRECT" in the last 72 hours. Thyroid Function Tests: No results for input(s): "TSH", "T4TOTAL", "FREET4", "T3FREE", "THYROIDAB" in the last 72 hours. Anemia Panel: No results for input(s): "VITAMINB12", "FOLATE", "FERRITIN", "TIBC", "IRON", "RETICCTPCT" in the last 72 hours. Urine analysis:    Component Value Date/Time   COLORURINE YELLOW (A) 07/12/2022 0010   APPEARANCEUR CLOUDY (A) 07/12/2022 0010   LABSPEC 1.016 07/12/2022 0010   PHURINE 5.0 07/12/2022 0010   GLUCOSEU NEGATIVE 07/12/2022 0010   HGBUR NEGATIVE 07/12/2022 0010   BILIRUBINUR NEGATIVE 07/12/2022 0010   KETONESUR NEGATIVE 07/12/2022  0010   PROTEINUR NEGATIVE 07/12/2022 0010   NITRITE NEGATIVE 07/12/2022 0010   LEUKOCYTESUR NEGATIVE 07/12/2022 0010   Sepsis Labs: @LABRCNTIP (procalcitonin:4,lacticidven:4)  ) Recent Results (from the past 240 hour(s))  Resp Panel by RT-PCR (Flu A&B, Covid) Anterior Nasal Swab     Status: None   Collection Time: 07/11/22  7:40 PM   Specimen: Anterior Nasal Swab  Result Value Ref Range Status   SARS Coronavirus 2 by RT PCR NEGATIVE NEGATIVE Final    Comment: (NOTE) SARS-CoV-2 target nucleic acids are NOT DETECTED.  The SARS-CoV-2 RNA is generally detectable in upper respiratory specimens during the acute phase of infection. The lowest concentration of SARS-CoV-2 viral copies this assay can detect is 138 copies/mL. A negative result does not preclude SARS-Cov-2 infection and should not be used as the sole basis for treatment or other patient management decisions. A negative result may occur with  improper specimen collection/handling, submission of specimen other than nasopharyngeal swab, presence  of viral mutation(s) within the areas targeted by this assay, and inadequate number of viral copies(<138 copies/mL). A negative result must be combined with clinical observations, patient history, and epidemiological information. The expected result is Negative.  Fact Sheet for Patients:  13/10/23  Fact Sheet for Healthcare Providers:  BloggerCourse.com  This test is no t yet approved or cleared by the [employer] FDA and  has been authorized for detection and/or diagnosis of SARS-CoV-2 by FDA under an Emergency Use Authorization (EUA). This EUA will remain  in effect (meaning this test can be used) for the duration of the COVID-19 declaration under Section 564(b)(1) of the Act, 21 U.S.C.section 360bbb-3(b)(1), unless the authorization is terminated  or revoked sooner.       Influenza A by PCR NEGATIVE NEGATIVE Final   Influenza B by PCR NEGATIVE NEGATIVE Final    Comment: (NOTE) The Xpert Xpress SARS-CoV-2/FLU/RSV plus assay is intended as an aid in the diagnosis of influenza from Nasopharyngeal swab specimens and should not be used as a sole basis for treatment. Nasal washings and aspirates are unacceptable for Xpert Xpress SARS-CoV-2/FLU/RSV testing.  Fact Sheet for Patients: [place]  Fact Sheet for Healthcare Providers: BloggerCourse.com  This test is not yet approved or cleared by the [employer] FDA and has been authorized for detection and/or diagnosis of SARS-CoV-2 by FDA under an Emergency Use Authorization (EUA). This EUA will remain in effect (meaning this test can be used) for the duration of the COVID-19 declaration under Section 564(b)(1) of the Act, 21 U.S.C. section 360bbb-3(b)(1), unless the authorization is terminated or revoked.  Performed at [facility], [address] [place], [name]          Radiology  Studies: CARDIAC CATHETERIZATION  Result Date: 07/14/2022   Mid RCA lesion is 30% stenosed.   1st Mrg lesion is 30% stenosed.   Prox LAD to Mid LAD lesion is 20% stenosed.   2nd Diag lesion is 30% stenosed.   Mid LAD lesion is 60% stenosed.   There is mild left ventricular systolic dysfunction.   LV end diastolic pressure is moderately elevated.   The left ventricular ejection fraction is 45-50% by visual estimate. 1.  Moderate one-vessel coronary arteries involving the mid LAD with 60% stenosis not significant by fractional flow reserve evaluation with an IFR of 0.91. 2.  Mildly reduced LV systolic function with hypokinesis of mid to distal left ventricular segments. 3.  Moderately elevated left ventricular end-diastolic pressure at 28 to 29 mmHg. Recommendations: The  echocardiogram images are suggestive of stress-induced cardiomyopathy.  The LAD does not wrap around the apex to explain the distal inferior wall hypokinesis.  Thus, the mid LAD stenosis was not stented but instead evaluated with fractional flow reserve evaluation which showed not a significant enough obstructive physiology. I did add clopidogrel to be used for 1 year. Referred the patient to cardiac rehab. I discontinued flecainide given the presence of coronary artery disease.  If ventricular arrhythmia recurs, she will need a different antiarrhythmic medication such as amiodarone. I switched furosemide to 40 mg by mouth daily to be used today. Monitor overnight and likely discharge home tomorrow if she remains stable.   ECHOCARDIOGRAM COMPLETE  Result Date: 07/13/2022    ECHOCARDIOGRAM REPORT   Patient Name:   AGAM TUOHY [facility] Date of Exam: 07/13/2022 Medical Rec #:  [id number]             Height:       60.0 in Accession #:    [id number]            Weight:       173.8 lb Date of Birth:  1941-07-20             BSA:          1.758 m Patient Age:    81 years              BP:           113/50 mmHg Patient Gender: F                     HR:            71 bpm. Exam Location:  [facility] Procedure: 2D Echo Indications:     NSTEMI I21.4  History:         Patient has prior history of Echocardiogram examinations, most                  recent 11/02/2017.  Sonographer:     Overton Mam RDCS Referring Phys:  [id number] Antonieta Iba Diagnosing Phys: Julien Nordmann MD IMPRESSIONS  1. Left ventricular ejection fraction, by estimation, is 50 to 55%. The left ventricle has low normal function. The left ventricle demonstrates regional wall motion abnormalities (Hypokinesis of the distal anteroseptal and apical region). Left ventricular diastolic parameters are consistent with Grade I diastolic dysfunction (impaired relaxation).  2. Right ventricular systolic function is normal. The right ventricular size is normal. Tricuspid regurgitation signal is inadequate for assessing PA pressure.  3. The mitral valve is normal in structure. No evidence of mitral valve regurgitation. No evidence of mitral stenosis.  4. The aortic valve is normal in structure. Aortic valve regurgitation is mild. No aortic stenosis is present.  5. The inferior vena cava is normal in size with greater than 50% respiratory variability, suggesting right atrial pressure of 3 mmHg. FINDINGS  Left Ventricle: Left ventricular ejection fraction, by estimation, is 50 to 55%. The left ventricle has low normal function. The left ventricle demonstrates regional wall motion abnormalities. The left ventricular internal cavity size was normal in size. There is no left ventricular hypertrophy. Left ventricular diastolic parameters are consistent with Grade I diastolic dysfunction (impaired relaxation). Right Ventricle: The right ventricular size is normal. No increase in right ventricular wall thickness. Right ventricular systolic function is normal. Tricuspid regurgitation signal is inadequate for assessing PA pressure. Left Atrium: Left atrial size was normal in size. Right Atrium: Right atrial size was normal  in  size. Pericardium: There is no evidence of pericardial effusion. Mitral Valve: The mitral valve is normal in structure. No evidence of mitral valve regurgitation. No evidence of mitral valve stenosis. Tricuspid Valve: The tricuspid valve is normal in structure. Tricuspid valve regurgitation is not demonstrated. No evidence of tricuspid stenosis. Aortic Valve: The aortic valve is normal in structure. Aortic valve regurgitation is mild. No aortic stenosis is present. Aortic valve peak gradient measures 9.3 mmHg. Pulmonic Valve: The pulmonic valve was normal in structure. Pulmonic valve regurgitation is not visualized. No evidence of pulmonic stenosis. Aorta: The aortic root is normal in size and structure. Venous: The inferior vena cava is normal in size with greater than 50% respiratory variability, suggesting right atrial pressure of 3 mmHg. IAS/Shunts: No atrial level shunt detected by color flow Doppler.  LEFT VENTRICLE PLAX 2D LVIDd:         5.00 cm     Diastology LVIDs:         3.50 cm     LV e' medial:    4.40 cm/s LV PW:         1.00 cm     LV E/e' medial:  12.6 LV IVS:        1.10 cm     LV e' lateral:   6.69 cm/s LVOT diam:     2.10 cm     LV E/e' lateral: 8.3 LV SV:         80 LV SV Index:   45 LVOT Area:     3.46 cm  LV Volumes (MOD) LV vol d, MOD A2C: 35.2 ml LV vol d, MOD A4C: 45.3 ml LV vol s, MOD A2C: 15.6 ml LV vol s, MOD A4C: 18.0 ml LV SV MOD A2C:     19.6 ml LV SV MOD A4C:     45.3 ml LV SV MOD BP:      22.9 ml RIGHT VENTRICLE RV Basal diam:  2.20 cm RV S prime:     16.75 cm/s TAPSE (M-mode): 2.5 cm LEFT ATRIUM             Index        RIGHT ATRIUM           Index LA diam:        3.50 cm 1.99 cm/m   RA Area:     12.30 cm LA Vol (A2C):   19.6 ml 11.15 ml/m  RA Volume:   26.60 ml  15.13 ml/m LA Vol (A4C):   24.4 ml 13.88 ml/m LA Biplane Vol: 23.0 ml 13.08 ml/m  AORTIC VALVE                 PULMONIC VALVE AV Area (Vmax): 2.58 cm     PV Vmax:       1.14 m/s AV Vmax:        152.50 cm/s  PV Peak  grad:  5.2 mmHg AV Peak Grad:   9.3 mmHg LVOT Vmax:      113.50 cm/s LVOT Vmean:     75.200 cm/s LVOT VTI:       0.230 m  AORTA Ao Root diam: 3.20 cm Ao Asc diam:  3.10 cm MITRAL VALVE MV Area (PHT): 3.23 cm     SHUNTS MV Decel Time: 235 msec     Systemic VTI:  0.23 m MV E velocity: 55.50 cm/s   Systemic Diam: 2.10 cm MV A velocity: 107.00 cm/s MV E/A ratio:  0.52 [employer]  MD Electronically signed by Julien Nordmann MD Signature Date/Time: 07/13/2022/1:39:59 PM    Final         Scheduled Meds:  aspirin EC  81 mg Oral Daily   carvedilol  12.5 mg Oral BID   [START ON 07/15/2022] clopidogrel  75 mg Oral Q breakfast   diltiazem  120 mg Oral Q48H   flecainide  50 mg Oral BID   furosemide  40 mg Intravenous Daily   insulin aspart  0-15 Units Subcutaneous Q4H   loratadine  10 mg Oral Daily   memantine  10 mg Oral BID   pantoprazole  40 mg Oral Daily   rosuvastatin  5 mg Oral q1800   sodium chloride flush  3 mL Intravenous Q12H   sodium chloride flush  3 mL Intravenous Q12H   Continuous Infusions:  sodium chloride Stopped (07/14/22 0022)   sodium chloride     heparin Stopped (07/14/22 0900)     LOS: 3 days     Silvano Bilis, MD [facility]   If 7PM-7AM, please contact night-coverage [email or web address] Password [email or web address] 07/14/2022, 1:59 PM

## 2022-07-14 NOTE — TOC CM/SW Note (Signed)
TOC following due to acute oxygen need. No other TOC needs identified so far.  Charlynn Court, CSW [phone]

## 2022-07-14 NOTE — Progress Notes (Signed)
Rounding Note    Patient Name: Yvonne Newton Date of Encounter: 07/14/2022  [facility] Cardiologist: Sharrell KuGreg Taylor  Subjective   No chest pain or shortness of breath.  She reports significant stress at home.  Cardiac catheterization done today showed 60% mid LAD stenosis which was not significant by fractional flow reserve evaluation.  Inpatient Medications    Scheduled Meds:  [START ON 07/15/2022] aspirin  81 mg Oral Pre-Cath   [MAR Hold] aspirin EC  81 mg Oral Daily   [MAR Hold] carvedilol  12.5 mg Oral BID   [START ON 07/15/2022] clopidogrel  75 mg Oral Q breakfast   [MAR Hold] diltiazem  120 mg Oral Q48H   [MAR Hold] flecainide  50 mg Oral BID   [MAR Hold] furosemide  40 mg Intravenous Daily   [MAR Hold] insulin aspart  0-15 Units Subcutaneous Q4H   [MAR Hold] loratadine  10 mg Oral Daily   [MAR Hold] memantine  10 mg Oral BID   [MAR Hold] pantoprazole  40 mg Oral Daily   [MAR Hold] rosuvastatin  5 mg Oral q1800   sodium chloride flush  3 mL Intravenous Q12H   sodium chloride flush  3 mL Intravenous Q12H   Continuous Infusions:  [MAR Hold] sodium chloride Stopped (07/14/22 0022)   sodium chloride     sodium chloride     [START ON 07/15/2022] sodium chloride     Followed by   Melene Muller[START ON 07/15/2022] sodium chloride     heparin Stopped (07/14/22 0900)   PRN Meds: [MAR Hold] sodium chloride, sodium chloride, sodium chloride, [MAR Hold] acetaminophen, Heparin (Porcine) in NaCl, heparin sodium (porcine), iohexol, labetalol, lidocaine (PF), midazolam, [MAR Hold] nitroGLYCERIN, [MAR Hold] ondansetron **OR** [MAR Hold] ondansetron (ZOFRAN) IV, sodium chloride flush, sodium chloride flush, verapamil   Vital Signs    Vitals:   07/14/22 0734 07/14/22 0805 07/14/22 0855 07/14/22 1000  BP: (!) 130/49 (!) 150/65  116/74  Pulse: 66 75  71  Resp: 16 17  16   Temp: 98 F (36.7 C) 97.9 F (36.6 C)    TempSrc: Oral Oral    SpO2: 97% 97% 98% 96%  Weight:       Height:        Intake/Output Summary (Last 24 hours) at 07/14/2022 1014 Last data filed at 07/14/2022 0551 Gross per 24 hour  Intake 887.91 ml  Output --  Net 887.91 ml       07/12/2022    5:00 AM 07/11/2022    8:08 PM 05/07/2022    2:33 PM  Last 3 Weights  Weight (lbs) 173 lb 12.8 oz 175 lb 6.4 oz 172 lb  Weight (kg) 78.835 kg 79.561 kg 78.019 kg      Telemetry    Normal sinus rhythm- Personally Reviewed  ECG     - Personally Reviewed  Physical Exam   GEN: No acute distress.   Neck: No JVD Cardiac: RRR, no murmurs, rubs, or gallops.  Respiratory: Clear to auscultation bilaterally. GI: Soft, nontender, non-distended  MS: No edema; No deformity. Neuro:  Nonfocal  Psych: Normal affect   Labs    High Sensitivity Troponin:   Recent Labs  Lab 07/11/22 1707 07/11/22 1922 07/11/22 2300 07/12/22 0045  TROPONINIHS 86* 259* 459* 494*      Chemistry Recent Labs  Lab 07/11/22 1707 07/13/22 0318 07/13/22 0852 07/14/22 0422  NA 139 138  --  140  K 4.3 3.3*  --  3.8  CL 106 104  --  105  CO2 25 29  --  26  GLUCOSE 110* 136*  --  138*  BUN 20 19  --  27*  CREATININE 0.55 0.81  --  0.85  CALCIUM 9.4 8.4*  --  9.4  MG  --   --  1.7 2.3  PROT 7.3  --   --   --   ALBUMIN 4.1  --   --   --   AST 24  --   --   --   ALT 18  --   --   --   ALKPHOS 50  --   --   --   BILITOT 1.2  --   --   --   GFRNONAA >60 >60  --  >60  ANIONGAP 8 5  --  9     Lipids No results for input(s): "CHOL", "TRIG", "HDL", "LABVLDL", "LDLCALC", "CHOLHDL" in the last 168 hours.  Hematology Recent Labs  Lab 07/12/22 0442 07/13/22 0318 07/14/22 0422  WBC 14.7* 12.8* 10.0  RBC 4.64 4.37 4.10  HGB 13.0 12.2 11.6*  HCT 40.1 38.0 36.2  MCV 86.4 87.0 88.3  MCH 28.0 27.9 28.3  MCHC 32.4 32.1 32.0  RDW 13.5 13.6 13.7  PLT 220 194 175    Thyroid No results for input(s): "TSH", "FREET4" in the last 168 hours.  BNP Recent Labs  Lab 07/11/22 1701 07/11/22 2300  BNP 55.7 119.9*      DDimer  Recent Labs  Lab 07/11/22 1707  DDIMER 0.55*      Radiology    CARDIAC CATHETERIZATION  Result Date: 07/14/2022   Mid RCA lesion is 30% stenosed.   1st Mrg lesion is 30% stenosed.   Prox LAD to Mid LAD lesion is 20% stenosed.   2nd Diag lesion is 30% stenosed.   Mid LAD lesion is 60% stenosed.   There is mild left ventricular systolic dysfunction.   LV end diastolic pressure is moderately elevated.   The left ventricular ejection fraction is 45-50% by visual estimate. 1.  Moderate one-vessel coronary arteries involving the mid LAD with 60% stenosis not significant by fractional flow reserve evaluation with an IFR of 0.91. 2.  Mildly reduced LV systolic function with hypokinesis of mid to distal left ventricular segments. 3.  Moderately elevated left ventricular end-diastolic pressure at 28 to 29 mmHg. Recommendations: The echocardiogram images are suggestive of stress-induced cardiomyopathy.  The LAD does not wrap around the apex to explain the distal inferior wall hypokinesis.  Thus, the mid LAD stenosis was not stented but instead evaluated with fractional flow reserve evaluation which showed not a significant enough obstructive physiology. I did add clopidogrel to be used for 1 year. Referred the patient to cardiac rehab. I discontinued flecainide given the presence of coronary artery disease.  If ventricular arrhythmia recurs, she will need a different antiarrhythmic medication such as amiodarone. I switched furosemide to 40 mg by mouth daily to be used today. Monitor overnight and likely discharge home tomorrow if she remains stable.   ECHOCARDIOGRAM COMPLETE  Result Date: 07/13/2022    ECHOCARDIOGRAM REPORT   Patient Name:   Yvonne Newton Date of Exam: 07/13/2022 Medical Rec #:  [id number]             Height:       60.0 in Accession #:    [id number]            Weight:       173.8 lb Date of Birth:  1940-11-12             BSA:          1.758 m Patient Age:    81 years               BP:           113/50 mmHg Patient Gender: F                     HR:           71 bpm. Exam Location:  [facility] Procedure: 2D Echo Indications:     NSTEMI I21.4  History:         Patient has prior history of Echocardiogram examinations, most                  recent 11/02/2017.  Sonographer:     Overton Mam RDCS Referring Phys:  [id number] Antonieta Iba Diagnosing Phys: Julien Nordmann MD IMPRESSIONS  1. Left ventricular ejection fraction, by estimation, is 50 to 55%. The left ventricle has low normal function. The left ventricle demonstrates regional wall motion abnormalities (Hypokinesis of the distal anteroseptal and apical region). Left ventricular diastolic parameters are consistent with Grade I diastolic dysfunction (impaired relaxation).  2. Right ventricular systolic function is normal. The right ventricular size is normal. Tricuspid regurgitation signal is inadequate for assessing PA pressure.  3. The mitral valve is normal in structure. No evidence of mitral valve regurgitation. No evidence of mitral stenosis.  4. The aortic valve is normal in structure. Aortic valve regurgitation is mild. No aortic stenosis is present.  5. The inferior vena cava is normal in size with greater than 50% respiratory variability, suggesting right atrial pressure of 3 mmHg. FINDINGS  Left Ventricle: Left ventricular ejection fraction, by estimation, is 50 to 55%. The left ventricle has low normal function. The left ventricle demonstrates regional wall motion abnormalities. The left ventricular internal cavity size was normal in size. There is no left ventricular hypertrophy. Left ventricular diastolic parameters are consistent with Grade I diastolic dysfunction (impaired relaxation). Right Ventricle: The right ventricular size is normal. No increase in right ventricular wall thickness. Right ventricular systolic function is normal. Tricuspid regurgitation signal is inadequate for assessing PA pressure. Left Atrium: Left  atrial size was normal in size. Right Atrium: Right atrial size was normal in size. Pericardium: There is no evidence of pericardial effusion. Mitral Valve: The mitral valve is normal in structure. No evidence of mitral valve regurgitation. No evidence of mitral valve stenosis. Tricuspid Valve: The tricuspid valve is normal in structure. Tricuspid valve regurgitation is not demonstrated. No evidence of tricuspid stenosis. Aortic Valve: The aortic valve is normal in structure. Aortic valve regurgitation is mild. No aortic stenosis is present. Aortic valve peak gradient measures 9.3 mmHg. Pulmonic Valve: The pulmonic valve was normal in structure. Pulmonic valve regurgitation is not visualized. No evidence of pulmonic stenosis. Aorta: The aortic root is normal in size and structure. Venous: The inferior vena cava is normal in size with greater than 50% respiratory variability, suggesting right atrial pressure of 3 mmHg. IAS/Shunts: No atrial level shunt detected by color flow Doppler.  LEFT VENTRICLE PLAX 2D LVIDd:         5.00 cm     Diastology LVIDs:         3.50 cm     LV e' medial:    4.40 cm/s LV PW:         1.00  cm     LV E/e' medial:  12.6 LV IVS:        1.10 cm     LV e' lateral:   6.69 cm/s LVOT diam:     2.10 cm     LV E/e' lateral: 8.3 LV SV:         80 LV SV Index:   45 LVOT Area:     3.46 cm  LV Volumes (MOD) LV vol d, MOD A2C: 35.2 ml LV vol d, MOD A4C: 45.3 ml LV vol s, MOD A2C: 15.6 ml LV vol s, MOD A4C: 18.0 ml LV SV MOD A2C:     19.6 ml LV SV MOD A4C:     45.3 ml LV SV MOD BP:      22.9 ml RIGHT VENTRICLE RV Basal diam:  2.20 cm RV S prime:     16.75 cm/s TAPSE (M-mode): 2.5 cm LEFT ATRIUM             Index        RIGHT ATRIUM           Index LA diam:        3.50 cm 1.99 cm/m   RA Area:     12.30 cm LA Vol (A2C):   19.6 ml 11.15 ml/m  RA Volume:   26.60 ml  15.13 ml/m LA Vol (A4C):   24.4 ml 13.88 ml/m LA Biplane Vol: 23.0 ml 13.08 ml/m  AORTIC VALVE                 PULMONIC VALVE AV Area  (Vmax): 2.58 cm     PV Vmax:       1.14 m/s AV Vmax:        152.50 cm/s  PV Peak grad:  5.2 mmHg AV Peak Grad:   9.3 mmHg LVOT Vmax:      113.50 cm/s LVOT Vmean:     75.200 cm/s LVOT VTI:       0.230 m  AORTA Ao Root diam: 3.20 cm Ao Asc diam:  3.10 cm MITRAL VALVE MV Area (PHT): 3.23 cm     SHUNTS MV Decel Time: 235 msec     Systemic VTI:  0.23 m MV E velocity: 55.50 cm/s   Systemic Diam: 2.10 cm MV A velocity: 107.00 cm/s MV E/A ratio:  0.52 Julien Nordmann MD Electronically signed by Julien Nordmann MD Signature Date/Time: 07/13/2022/1:39:59 PM    Final     Cardiac Studies   Echocardiogram pending  Patient Profile     Yvonne Newton is a 81 y.o. 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 Non-STEMI Presenting with back, arm, chest pain concerning for angina Elevated troponin, peak so far 494 Risk factors including hyperlipidemia, diabetes, remote smoking Echocardiogram images personally reviewed by me and showed mildly reduced LV systolic function with severe hypokinesis of mid/apical segments.  Suggestive of stress-induced cardiomyopathy. Significant stress at home.  Cardiac catheterization done today via the right radial artery which showed 60% stenosis in the mid LAD which was not significant by fractional flow reserve evaluation.  Suspect that her presentation is likely due to stress-induced cardiomyopathy.  However, cannot completely exclude myocardial infarction without obstructive coronary artery disease. I added clopidogrel 75  mg once daily to be used for 1 year. I referred the patient to cardiac rehab.  2.  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  LVEDP was 28 by  cardiac cath.  The patient received IV furosemide and I switched her to oral furosemide 40 mg once daily.  I added losartan 25 mg once daily.  Continue carvedilol.     3. History of right ventricular outflow tract ventricular tachycardia Treated with flecainide However, given presentation with myocardial infarction in the presence of coronary artery disease and cardiac cath, class Ic medications are contraindicated.  I discontinued flecainide.  If she develops recurrent ventricular tachycardia, she will require ablation or a different antiarrhythmic medication such as amiodarone.  This should be addressed by EP as an outpatient.   Recommend keeping the patient for 1 more night and possibly discharge home tomorrow if she remains stable.   Total encounter time more than 35 minutes  Greater than 50% was spent in counseling and coordination of care with the patient   For questions or updates, please contact [facility] Please consult [email or web address] for contact info under        Signed, Lorine Bears, MD  07/14/2022, 10:14 AM

## 2022-07-14 NOTE — Consult Note (Signed)
ANTICOAGULATION CONSULT NOTE  Pharmacy Consult for Heparin Infusion Indication: chest pain/ACS  Allergies  Allergen Reactions   Blueberry Flavor Hives and Shortness Of Breath   Sulfa Antibiotics Itching   Sulfonamide Derivatives Itching   Other Swelling and Rash    METAL    Patient Measurements: Height: 5' (152.4 cm) Weight: 78.8 kg (173 lb 12.8 oz) IBW/kg (Calculated) : 45.5 Heparin Dosing Weight: 63.7 kg  Vital Signs: Temp: 97.9 F (36.6 C) (11/13 0427) Temp Source: Oral (11/13 0353) BP: 123/96 (11/13 0427) Pulse Rate: 68 (11/13 0427)  Labs: Recent Labs    07/11/22 1707 07/11/22 1922 07/11/22 2133 07/11/22 2300 07/12/22 0045 07/12/22 0442 07/12/22 1457 07/13/22 0104 07/13/22 0318 07/13/22 0852 07/14/22 0422  HGB 13.1  --   --   --   --  13.0  --   --  12.2  --  11.6*  HCT 40.2  --   --   --   --  40.1  --   --  38.0  --  36.2  PLT 226  --   --   --   --  220  --   --  194  --  175  APTT  --   --  105*  --   --   --   --   --   --   --   --   LABPROT  --   --  14.2  --   --   --   --   --   --   --   --   INR  --   --  1.1  --   --   --   --   --   --   --   --   HEPARINUNFRC  --   --   --   --   --  0.15*   < > 0.55  --  0.47 0.49  CREATININE 0.55  --   --   --   --   --   --   --  0.81  --  0.85  TROPONINIHS 86* 259*  --  459* 494*  --   --   --   --   --   --    < > = values in this interval not displayed.     Estimated Creatinine Clearance: 48.2 mL/min (by C-G formula based on SCr of 0.85 mg/dL).   Medical History: Past Medical History:  Diagnosis Date   Arthritis 07/22/2017   Cerebellar stroke (HCC)    Diabetes mellitus type 2 in nonobese (HCC)    DM (diabetes mellitus) (HCC)    Dysarthria    Essential hypertension 07/22/2017   Hyperlipidemia, unspecified 07/22/2017   OA (osteoarthritis) of knee    right   Obesity    Stroke (HCC)    Tachycardia 07/22/2017   VENTRICULAR TACHYCARDIA 09/05/2009   Qualifier: Diagnosis of  By: Susette Racer CMA, Jewel      Ventricular tachycardia (HCC)    Vitamin B 12 deficiency     Medications:  Medications Prior to Admission  Medication Sig Dispense Refill Last Dose   aspirin EC 81 MG EC tablet Take 1 tablet (81 mg total) by mouth daily.   07/11/2022   carvedilol (COREG) 12.5 MG tablet TAKE 1 TABLET BY MOUTH TWICE A DAY 180 tablet 3 07/11/2022   cetirizine (ZYRTEC) 10 MG tablet Take 10 mg by mouth daily.  2 07/11/2022   CYANOCOBALAMIN IJ Inject as directed  every 30 (thirty) days. On or about the 26th of each month   Past Month   diltiazem (CARDIZEM CD) 120 MG 24 hr capsule TAKE 1 CAPSULE BY MOUTH EVERY DAY 90 capsule 3 07/11/2022   flecainide (TAMBOCOR) 50 MG tablet TAKE 1 TABLET (50 MG TOTAL) BY MOUTH 2 (TWO) TIMES DAILY. 60 tablet 2 07/11/2022   glipiZIDE (GLUCOTROL XL) 2.5 MG 24 hr tablet Take 2.5 mg by mouth daily with breakfast.   07/11/2022   memantine (NAMENDA TITRATION PACK) tablet pack Take 1 tablet by mouth in the morning and at bedtime.   07/11/2022   metFORMIN (GLUCOPHAGE-XR) 500 MG 24 hr tablet Take 500-1,000 mg by mouth See admin instructions. Take one tablet (500 mg) by mouth every morning and two tablets (1000 mg) at bedtime   07/11/2022   omeprazole (PRILOSEC) 20 MG capsule Take 20 mg by mouth daily.    07/11/2022   rosuvastatin (CRESTOR) 5 MG tablet Take 1 tablet (5 mg total) by mouth daily at 6 PM. 90 tablet 3 07/10/2022   EPINEPHrine 0.3 mg/0.3 mL IJ SOAJ injection Inject 0.3 mg into the muscle once as needed (severe allergic reaction).   prn   Vitamin D, Ergocalciferol, (DRISDOL) 50000 units CAPS capsule Take 50,000 Units by mouth every 7 (seven) days. (Patient not taking: Reported on 07/11/2022)   Not Taking   Scheduled:   aspirin EC  81 mg Oral Daily   carvedilol  12.5 mg Oral BID   diltiazem  120 mg Oral Q48H   flecainide  50 mg Oral BID   furosemide  40 mg Intravenous Daily   insulin aspart  0-15 Units Subcutaneous Q4H   loratadine  10 mg Oral Daily   memantine  10 mg Oral BID    pantoprazole  40 mg Oral Daily   rosuvastatin  5 mg Oral q1800   Infusions:   sodium chloride 10 mL/hr at 07/14/22 0000   sodium chloride     heparin 1,100 Units/hr (07/14/22 0000)   PRN:  Anti-infectives (From admission, onward)    None       Assessment: Yvonne Newton is a 81 y.o. female presenting with concern for NSTEMI. 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 Patient was not on [facility] PTA per chart review. She has troponin elevation with peak at 494. She has planned [facility] 11/13. Pharmacy has been consulted to initiate and manage heparin infusion.    Goal of Therapy:  Heparin level 0.3-0.7 units/ml Monitor platelets by anticoagulation protocol: Yes   Date Time HL Rate/Comment  11/11 0442 0.15 750/SUBtherapeutic 11/11 1457 0.29 1000/SUBtherapeutic  11/12   0104    0.55     1100/Therapeutic X 1  11/12 0852 0.47 1100/therapeutic x 2  11/13   0422    0.49     1100/therapeutic X 3   Plan:  11/13:  HL @ 0422 = 0.49, therapeutic X 3 Will continue pt on current rate and recheck HL on 11/14 with AM labs.   Yvonne Newton D, PharmD,  07/14/2022 5:39 AM

## 2022-07-14 NOTE — Plan of Care (Signed)
  Problem: Coping: Goal: Ability to adjust to condition or change in health will improve Outcome: Progressing   Problem: Fluid Volume: Goal: Ability to maintain a balanced intake and output will improve Outcome: Progressing   

## 2022-07-15 ENCOUNTER — Encounter: Payer: Self-pay | Admitting: Cardiovascular Disease

## 2022-07-15 DIAGNOSIS — I214 Non-ST elevation (NSTEMI) myocardial infarction: Secondary | ICD-10-CM | POA: Diagnosis not present

## 2022-07-15 LAB — GLUCOSE, CAPILLARY
Glucose-Capillary: 123 mg/dL — ABNORMAL HIGH (ref 70–99)
Glucose-Capillary: 121 mg/dL — ABNORMAL HIGH (ref 70–99)
Glucose-Capillary: 160 mg/dL — ABNORMAL HIGH (ref 70–99)

## 2022-07-15 LAB — BASIC METABOLIC PANEL
Anion gap: 8 (ref 5–15)
BUN: 25 mg/dL — ABNORMAL HIGH (ref 8–23)
CO2: 29 mmol/L (ref 22–32)
Calcium: 9.2 mg/dL (ref 8.9–10.3)
Chloride: 105 mmol/L (ref 98–111)
Creatinine, Ser: 0.74 mg/dL (ref 0.44–1.00)
GFR, Estimated: >60 mL/min (ref >60)
Glucose, Bld: 123 mg/dL — ABNORMAL HIGH (ref 70–99)
Potassium: 3.8 mmol/L (ref 3.5–5.1)
Sodium: 142 mmol/L (ref 135–145)

## 2022-07-15 LAB — CBC
HCT: 36.6 % (ref 36.0–46.0)
Hemoglobin: 11.7 g/dL — ABNORMAL LOW (ref 12.0–15.0)
MCH: 27.9 pg (ref 26.0–34.0)
MCHC: 32 g/dL (ref 30.0–36.0)
MCV: 87.4 fL (ref 80.0–100.0)
Platelets: 180 10*3/uL (ref 150–400)
RBC: 4.19 MIL/uL (ref 3.87–5.11)
RDW: 13.8 % (ref 11.5–15.5)
WBC: 10.3 10*3/uL (ref 4.0–10.5)
nRBC: 0 % (ref 0.0–0.2)

## 2022-07-15 LAB — LIPOPROTEIN A (LPA): Lipoprotein (a): 30.7 nmol/L — ABNORMAL HIGH (ref <75.0)

## 2022-07-15 MED ORDER — CLOPIDOGREL BISULFATE 75 MG PO TABS: 75.0000 mg | ORAL_TABLET | Freq: Every day | ORAL | 1 refills | Status: DC

## 2022-07-15 MED ORDER — FUROSEMIDE 40 MG PO TABS: 40.0000 mg | ORAL_TABLET | Freq: Every day | ORAL | 1 refills | Status: DC

## 2022-07-15 MED ORDER — LOSARTAN POTASSIUM 25 MG PO TABS: 25.0000 mg | ORAL_TABLET | Freq: Every day | ORAL | 1 refills | Status: DC

## 2022-07-15 NOTE — Consult Note (Signed)
   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  Wall motion abnormality noted.  Grade 1 diastolic dysfunction.   She presented to the emergency room with complaints of worsening shortness of breath, diaphoresis, nausea and bilateral upper arm pain, back pain.  Chest x-ray with interstitial edema.  BNP 119.  Comorbidities:  Diabetes Hypertension Stroke Mild dementia Ventricular tachycardia  Medications:  Aspirin 81 mg daily Carvedilol 12.5 2 times a day Plavix 75 mg daily Diltiazem 120 mg every 48 hours Furosemide 40 mg daily Losartan 25 mg daily  Labs:  Sodium 142, potassium 3.8, chloride 105, CO2 29, BUN 25, creatinine 0.74 Weight not documented Intake 540 mL Output 675 Blood pressure 128/52   Initial meeting with patient, she was sitting up in the bed in no acute distress.  She states that she thinks she is being discharged home today.  There were no family members or support at the bedside.  Discussed heart failure, patient states that she has heard the term before but really did not know what it meant.  States that "she has had a stroke in the past and really does not remember things like she would like to."  She states that she lives at home with several family members and at times her home can feel like it is quite crowded.  She states that her daughter is the one that does the cooking.  She states that she does add salt at the table and likes to eat potato chips.  Which she goes on to say she knows that its something that she should not be doing.  She states that she does not eat at restaurants.  Discussed  healthy choices.  Went over fluid restriction, made aware that she should not be taking in any more than 64 ounces daily and what constitutes a liquid.  She voices understanding.  States that she will drink to 12 ounce cans of Pepsi a day along with the numerous glasses of water.  She states at home that she is not very active, she does like to work in the  yard but her family told her that they do not want her doing that.  She states walking to the mailbox she is unable to do because she has to stop 2-3 times before she makes it to the road where her mailbox is.  Went over the importance of daily weight and what to report.  She states she does not have a scale, will supply her with one from the clinic.  She needs reinforcement with teaching above.  She will have daughter call me if she has questions.  She has follow-up in the outpatient heart failure clinic on November 30 at 1230.  She was given the living with heart failure teaching booklet, zone magnet, info on heart failure and low-sodium along with weight chart.  Also made her aware of the ventricle health program but patient states the only thing she uses her phone for is to make phone calls and to play games.  She states that she does not email text or video chat.  Tresa Endo RN [facility]

## 2022-07-15 NOTE — Discharge Summary (Signed)
Yvonne Newton GEZ:[id number] DOB: Oct 15, 1940 DOA: 07/11/2022  PCP: Ellene Route  Admit date: 07/11/2022 Discharge date: 07/15/2022  Time spent: 35 minutes  Recommendations for Outpatient Follow-up:  Cardiology and pcp f/u  Consider addition of sglt2 inhibitor    Discharge Diagnoses:  Principal Problem:   NSTEMI (non-ST elevated myocardial infarction) [facility]) Active Problems:   Acute respiratory failure with hypoxia ([place])   Acute on chronic diastolic CHF (congestive heart failure) ([place])   Chest pain   OBESITY   Diabetes mellitus type 2 with complications ([place])   Essential hypertension   VT (ventricular tachycardia) ([place])   History of cerebellar stroke 2019   History of ventricular tachycardia   Dementia without behavioral disturbance ([place])   Coronary artery disease involving native coronary artery of native heart with unstable angina pectoris ([place])   Stress-induced cardiomyopathy   Discharge Condition: stable  Diet recommendation: heart healthy  Filed Weights   07/11/22 2008 07/12/22 0500  Weight: 79.6 kg 78.8 kg    History of present illness:  From admission h and p Yvonne Newton is a 81 y.o. female with medical history significant for NIDDM, HTN, G1 DD on echo 2019, with no history of CAD, cerebellar stroke in 2019, mild dementia and history of ventricular tachycardia with removal of implantable loop recorder 10/2021 with no events, currently on flecainide, who was brought to the ED by EMS after the onset of bilateral arm pain, upper back pain and chest pain starting while walking outside.  She had associated diaphoresis, shortness of breath and nausea.  History is taken from part from daughter over the phone who states that at baseline patient has chronic dyspnea with lower extremity edema and most nights gets up in the middle of the night to go sleep in a recliner.  She is not certain if this is for comfort or for shortness of breath.  She has had no  cough, fever or chills.  She states her mother was previously on a fluid pill but no longer takes it.  Patient reports relief in her chest pain but continues to indicate lower anterior and mid back pain encircling her chest     Hospital Course:  # NSTEMI # CAD Presenting with chest pain, TWIs on EKG, and troponin elevation 494 at last check. Currently chest pain free. Received aspirin. LHC 11/13 with non-obstructive CAD. Heparin discontinued. Ambulated with PT without problems - continue aspirin and rosuvastatin, plavix added - cardiac rehab ordered   # HFpEF # Stress-induced cardiomyopathy Normal EF on TTE several years ago. Here mildly elevated bnp, lower extremity edema though this may be chronic. TTE here with rebional wall motion abnormalities of the LV not explained by cath findings, hence thinking this is stress-induced cardiomyopathy. - lasix transitioned to oral - cardiology following - losartan added   # Acute respiratory failure No documented hypoxia but reportedly dyspneic on arrival. Now weaned off o2, breathing comfortably   # Dementia May also have some in-hospital delirium. Confusion resolved - home memantine   # Hx v-tach - home flecainide discontinued given presence of CAD, cardiology advises alternate agent such as amio should problem arise again, will plan on outpatient cardiology f/u   # Hx CVA - cont home asa, statin; plavix added as above   # HTN BP wnl - cont home dilt and coreg   # T2DM Glucose appropriate - consider transition to sglt2i given CAD  Procedures: Left heart cath   Consultations: cardiology  Discharge Exam: Vitals:  07/15/22 0441 07/15/22 0734  BP: (!) 139/48 (!) 128/52  Pulse: 64 72  Resp: 20 17  Temp: 98 F (36.7 C) 98 F (36.7 C)  SpO2: 94% 92%    General: nad Cardiovascular: rrr Respiratory: ctab  Discharge Instructions   Discharge Instructions     AMB Referral to Cardiac Rehabilitation - Phase II   Complete  by: As directed    Diagnosis: NSTEMI   After initial evaluation and assessments completed: Virtual Based Care may be provided alone or in conjunction with Phase 2 Cardiac Rehab based on patient barriers.: Yes   Intensive Cardiac Rehabilitation (ICR) [facility] location only OR Traditional Cardiac Rehabilitation (TCR) *If criteria for ICR are not met will enroll in TCR [facility] only): Yes   Diet - low sodium heart healthy   Complete by: As directed    Increase activity slowly   Complete by: As directed       Allergies as of 07/15/2022       Reactions   Blueberry Flavor Hives, Shortness Of Breath   Influenza Vaccines    Sulfa Antibiotics Itching   Sulfonamide Derivatives Itching   Other Swelling, Rash   METAL        Medication List     STOP taking these medications    flecainide 50 MG tablet Commonly known as: TAMBOCOR   omeprazole 20 MG capsule Commonly known as: PRILOSEC       TAKE these medications    aspirin EC 81 MG tablet Take 1 tablet (81 mg total) by mouth daily.   carvedilol 12.5 MG tablet Commonly known as: COREG TAKE 1 TABLET BY MOUTH TWICE A DAY   cetirizine 10 MG tablet Commonly known as: ZYRTEC Take 10 mg by mouth daily.   clopidogrel 75 MG tablet Commonly known as: PLAVIX Take 1 tablet (75 mg total) by mouth daily with breakfast. Start taking on: July 16, 2022   CYANOCOBALAMIN IJ Inject as directed every 30 (thirty) days. On or about the 26th of each month   diltiazem 120 MG 24 hr capsule Commonly known as: CARDIZEM CD TAKE 1 CAPSULE BY MOUTH EVERY DAY   EPINEPHrine 0.3 mg/0.3 mL Soaj injection Commonly known as: EPI-PEN Inject 0.3 mg into the muscle once as needed (severe allergic reaction).   furosemide 40 MG tablet Commonly known as: LASIX Take 1 tablet (40 mg total) by mouth daily. Start taking on: July 16, 2022   glipiZIDE 2.5 MG 24 hr tablet Commonly known as: GLUCOTROL XL Take 2.5 mg by mouth daily with breakfast.    losartan 25 MG tablet Commonly known as: COZAAR Take 1 tablet (25 mg total) by mouth daily. Start taking on: July 16, 2022   memantine tablet pack Commonly known as: NAMENDA TITRATION PACK Take 1 tablet by mouth in the morning and at bedtime.   metFORMIN 500 MG 24 hr tablet Commonly known as: GLUCOPHAGE-XR Take 500-1,000 mg by mouth See admin instructions. Take one tablet (500 mg) by mouth every morning and two tablets (1000 mg) at bedtime   rosuvastatin 5 MG tablet Commonly known as: Crestor Take 1 tablet (5 mg total) by mouth daily at 6 PM.   Vitamin D (Ergocalciferol) 1.25 MG (50000 UNIT) Caps capsule Commonly known as: DRISDOL Take 50,000 Units by mouth every 7 (seven) days.       Allergies  Allergen Reactions   Blueberry Flavor Hives and Shortness Of Breath   Influenza Vaccines    Sulfa Antibiotics Itching   Sulfonamide Derivatives Itching  Other Swelling and Rash    METAL    Follow-up Information     Evans Lance, MD. Schedule an appointment as soon as possible for a visit.   Specialty: Cardiology Contact information: [address] [place] [address] [phone]         Ellene Route Follow up.   Contact information: [place] [address] [phone]                  The results of significant diagnostics from this hospitalization (including imaging, microbiology, ancillary and laboratory) are listed below for reference.    Significant Diagnostic Studies: CARDIAC CATHETERIZATION  Result Date: 07/14/2022   Mid RCA lesion is 30% stenosed.   1st Mrg lesion is 30% stenosed.   Prox LAD to Mid LAD lesion is 20% stenosed.   2nd Diag lesion is 30% stenosed.   Mid LAD lesion is 60% stenosed.   There is mild left ventricular systolic dysfunction.   LV end diastolic pressure is moderately elevated.   The left ventricular ejection fraction is 45-50% by visual estimate. 1.  Moderate one-vessel  coronary arteries involving the mid LAD with 60% stenosis not significant by fractional flow reserve evaluation with an IFR of 0.91. 2.  Mildly reduced LV systolic function with hypokinesis of mid to distal left ventricular segments. 3.  Moderately elevated left ventricular end-diastolic pressure at 28 to 29 mmHg. Recommendations: The echocardiogram images are suggestive of stress-induced cardiomyopathy.  The LAD does not wrap around the apex to explain the distal inferior wall hypokinesis.  Thus, the mid LAD stenosis was not stented but instead evaluated with fractional flow reserve evaluation which showed not a significant enough obstructive physiology. I did add clopidogrel to be used for 1 year. Referred the patient to cardiac rehab. I discontinued flecainide given the presence of coronary artery disease.  If ventricular arrhythmia recurs, she will need a different antiarrhythmic medication such as amiodarone. I switched furosemide to 40 mg by mouth daily to be used today. Monitor overnight and likely discharge home tomorrow if she remains stable.   ECHOCARDIOGRAM COMPLETE  Result Date: 07/13/2022    ECHOCARDIOGRAM REPORT   Patient Name:   Yvonne Newton [facility] Date of Exam: 07/13/2022 Medical Rec #:  [id number]             Height:       60.0 in Accession #:    [id number]            Weight:       173.8 lb Date of Birth:  [birth year]             BSA:          1.758 m Patient Age:    41 years              BP:           113/50 mmHg Patient Gender: F                     HR:           71 bpm. Exam Location:  [facility] Procedure: 2D Echo Indications:     NSTEMI I21.4  History:         Patient has prior history of Echocardiogram examinations, most                  recent 11/02/2017.  Sonographer:     Kathlen Brunswick RDCS Referring  Phys:  [place]olin Phys: Ida Rogue MD IMPRESSIONS  1. Left ventricular ejection fraction, by estimation, is 50 to 55%. The left ventricle has low normal function. The  left ventricle demonstrates regional wall motion abnormalities (Hypokinesis of the distal anteroseptal and apical region). Left ventricular diastolic parameters are consistent with Grade I diastolic dysfunction (impaired relaxation).  2. Right ventricular systolic function is normal. The right ventricular size is normal. Tricuspid regurgitation signal is inadequate for assessing PA pressure.  3. The mitral valve is normal in structure. No evidence of mitral valve regurgitation. No evidence of mitral stenosis.  4. The aortic valve is normal in structure. Aortic valve regurgitation is mild. No aortic stenosis is present.  5. The inferior vena cava is normal in size with greater than 50% respiratory variability, suggesting right atrial pressure of 3 mmHg. FINDINGS  Left Ventricle: Left ventricular ejection fraction, by estimation, is 50 to 55%. The left ventricle has low normal function. The left ventricle demonstrates regional wall motion abnormalities. The left ventricular internal cavity size was normal in size. There is no left ventricular hypertrophy. Left ventricular diastolic parameters are consistent with Grade I diastolic dysfunction (impaired relaxation). Right Ventricle: The right ventricular size is normal. No increase in right ventricular wall thickness. Right ventricular systolic function is normal. Tricuspid regurgitation signal is inadequate for assessing PA pressure. Left Atrium: Left atrial size was normal in size. Right Atrium: Right atrial size was normal in size. Pericardium: There is no evidence of pericardial effusion. Mitral Valve: The mitral valve is normal in structure. No evidence of mitral valve regurgitation. No evidence of mitral valve stenosis. Tricuspid Valve: The tricuspid valve is normal in structure. Tricuspid valve regurgitation is not demonstrated. No evidence of tricuspid stenosis. Aortic Valve: The aortic valve is normal in structure. Aortic valve regurgitation is mild. No aortic  stenosis is present. Aortic valve peak gradient measures 9.3 mmHg. Pulmonic Valve: The pulmonic valve was normal in structure. Pulmonic valve regurgitation is not visualized. No evidence of pulmonic stenosis. Aorta: The aortic root is normal in size and structure. Venous: The inferior vena cava is normal in size with greater than 50% respiratory variability, suggesting right atrial pressure of 3 mmHg. IAS/Shunts: No atrial level shunt detected by color flow Doppler.  LEFT VENTRICLE PLAX 2D LVIDd:         5.00 cm     Diastology LVIDs:         3.50 cm     LV e' medial:    4.40 cm/s LV PW:         1.00 cm     LV E/e' medial:  12.6 LV IVS:        1.10 cm     LV e' lateral:   6.69 cm/s LVOT diam:     2.10 cm     LV E/e' lateral: 8.3 LV SV:         80 LV SV Index:   45 LVOT Area:     3.46 cm  LV Volumes (MOD) LV vol d, MOD A2C: 35.2 ml LV vol d, MOD A4C: 45.3 ml LV vol s, MOD A2C: 15.6 ml LV vol s, MOD A4C: 18.0 ml LV SV MOD A2C:     19.6 ml LV SV MOD A4C:     45.3 ml LV SV MOD BP:      22.9 ml RIGHT VENTRICLE RV Basal diam:  2.20 cm RV S prime:     16.75 cm/s TAPSE (M-mode):  2.5 cm LEFT ATRIUM             Index        RIGHT ATRIUM           Index LA diam:        3.50 cm 1.99 cm/m   RA Area:     12.30 cm LA Vol (A2C):   19.6 ml 11.15 ml/m  RA Volume:   26.60 ml  15.13 ml/m LA Vol (A4C):   24.4 ml 13.88 ml/m LA Biplane Vol: 23.0 ml 13.08 ml/m  AORTIC VALVE                 PULMONIC VALVE AV Area (Vmax): 2.58 cm     PV Vmax:       1.14 m/s AV Vmax:        152.50 cm/s  PV Peak grad:  5.2 mmHg AV Peak Grad:   9.3 mmHg LVOT Vmax:      113.50 cm/s LVOT Vmean:     75.200 cm/s LVOT VTI:       0.230 m  AORTA Ao Root diam: 3.20 cm Ao Asc diam:  3.10 cm MITRAL VALVE MV Area (PHT): 3.23 cm     SHUNTS MV Decel Time: 235 msec     Systemic VTI:  0.23 m MV E velocity: 55.50 cm/s   Systemic Diam: 2.10 cm MV A velocity: 107.00 cm/s MV E/A ratio:  0.52 Ida Rogue MD Electronically signed by Ida Rogue MD Signature Date/Time:  07/13/2022/1:39:59 PM    Final    CT Angio Chest PE W and/or Wo Contrast  Result Date: 07/11/2022 CLINICAL DATA:  Right arm pain, back pain, short of breath, positive D-dimer EXAM: CT ANGIOGRAPHY CHEST WITH CONTRAST TECHNIQUE: Multidetector CT imaging of the chest was performed using the standard protocol during bolus administration of intravenous contrast. Multiplanar CT image reconstructions and MIPs were obtained to evaluate the vascular anatomy. RADIATION DOSE REDUCTION: This exam was performed according to the departmental dose-optimization program which includes automated exposure control, adjustment of the mA and/or kV according to patient size and/or use of iterative reconstruction technique. CONTRAST:  4m OMNIPAQUE IOHEXOL 350 MG/ML SOLN COMPARISON:  07/11/2022 FINDINGS: Cardiovascular: This is a technically adequate evaluation of the pulmonary vasculature. No filling defects or pulmonary emboli. Mild cardiomegaly without pericardial effusion. Normal caliber of the thoracic aorta. Atherosclerosis of the aorta and coronary vasculature. Mediastinum/Nodes: No enlarged mediastinal, hilar, or axillary lymph nodes. Thyroid gland, trachea, and esophagus demonstrate no significant findings. Lungs/Pleura: There are scattered faint ground-glass opacity seen within the bilateral lower lobes, with associated bibasilar bronchial wall thickening, consistent with atypical pneumonia such as viral etiology. No effusion or pneumothorax. Upper Abdomen: Subtle nodularity of the liver capsule compatible with cirrhosis. No acute upper abdominal finding. Musculoskeletal: No acute or destructive bony lesions. Reconstructed images demonstrate no additional findings. Review of the MIP images confirms the above findings. IMPRESSION: 1. No evidence of pulmonary embolus. 2. Bibasilar bronchial wall thickening with faint scattered ground-glass airspace disease, compatible with atypical infection such as viral etiology. 3.  Cardiomegaly. 4. Aortic Atherosclerosis (ICD10-I70.0). Coronary artery atherosclerosis. 5. Subtle nodularity of the liver capsule suggesting underlying cirrhosis. Electronically Signed   By: MRanda NgoM.D.   On: 07/11/2022 19:01   DG Chest Portable 1 View  Result Date: 07/11/2022 CLINICAL DATA:  Shortness of breath EXAM: PORTABLE CHEST 1 VIEW COMPARISON:  05/07/2022 FINDINGS: Cardiomegaly with vascular congestion and mild diffuse interstitial opacity likely due to mild edema. No  sizable effusion. Aortic atherosclerosis. IMPRESSION: Cardiomegaly with vascular congestion and mild diffuse interstitial opacity likely due to mild edema. Electronically Signed   By: Donavan Foil M.D.   On: 07/11/2022 17:28    Microbiology: Recent Results (from the past 240 hour(s))  Resp Panel by RT-PCR (Flu A&B, Covid) Anterior Nasal Swab     Status: None   Collection Time: 07/11/22  7:40 PM   Specimen: Anterior Nasal Swab  Result Value Ref Range Status   SARS Coronavirus 2 by RT PCR NEGATIVE NEGATIVE Final    Comment: (NOTE) SARS-CoV-2 target nucleic acids are NOT DETECTED.  The SARS-CoV-2 RNA is generally detectable in upper respiratory specimens during the acute phase of infection. The lowest concentration of SARS-CoV-2 viral copies this assay can detect is 138 copies/mL. A negative result does not preclude SARS-Cov-2 infection and should not be used as the sole basis for treatment or other patient management decisions. A negative result may occur with  improper specimen collection/handling, submission of specimen other than nasopharyngeal swab, presence of viral mutation(s) within the areas targeted by this assay, and inadequate number of viral copies(<138 copies/mL). A negative result must be combined with clinical observations, patient history, and epidemiological information. The expected result is Negative.  Fact Sheet for Patients:  EntrepreneurPulse.com.au  Fact Sheet for  Healthcare Providers:  IncredibleEmployment.be  This test is no t yet approved or cleared by the [place] FDA and  has been authorized for detection and/or diagnosis of SARS-CoV-2 by FDA under an Emergency Use Authorization (EUA). This EUA will remain  in effect (meaning this test can be used) for the duration of the COVID-19 declaration under Section 564(b)(1) of the Act, 21 U.S.C.section 360bbb-3(b)(1), unless the authorization is terminated  or revoked sooner.       Influenza A by PCR NEGATIVE NEGATIVE Final   Influenza B by PCR NEGATIVE NEGATIVE Final    Comment: (NOTE) The Xpert Xpress SARS-CoV-2/FLU/RSV plus assay is intended as an aid in the diagnosis of influenza from Nasopharyngeal swab specimens and should not be used as a sole basis for treatment. Nasal washings and aspirates are unacceptable for Xpert Xpress SARS-CoV-2/FLU/RSV testing.  Fact Sheet for Patients: EntrepreneurPulse.com.au  Fact Sheet for Healthcare Providers: IncredibleEmployment.be  This test is not yet approved or cleared by the [place] FDA and has been authorized for detection and/or diagnosis of SARS-CoV-2 by FDA under an Emergency Use Authorization (EUA). This EUA will remain in effect (meaning this test can be used) for the duration of the COVID-19 declaration under Section 564(b)(1) of the Act, 21 U.S.C. section 360bbb-3(b)(1), unless the authorization is terminated or revoked.  Performed at [facility], [address] [place], [place] [address]      Labs: Basic Metabolic Panel: Recent Labs  Lab 07/11/22 1707 07/13/22 0318 07/13/22 0852 07/14/22 0422 07/15/22 0657  NA 139 138  --  140 142  K 4.3 3.3*  --  3.8 3.8  CL 106 104  --  105 105  CO2 25 29  --  26 29  GLUCOSE 110* 136*  --  138* 123*  BUN 20 19  --  27* 25*  CREATININE 0.55 0.81  --  0.85 0.74  CALCIUM 9.4 8.4*  --  9.4 9.2  MG  --   --  1.7  2.3  --    Liver Function Tests: Recent Labs  Lab 07/11/22 1707  AST 24  ALT 18  ALKPHOS 50  BILITOT 1.2  PROT 7.3  ALBUMIN 4.1   No results for input(s): "LIPASE", "AMYLASE" in the last 168 hours. No results for input(s): "AMMONIA" in the last 168 hours. CBC: Recent Labs  Lab 07/11/22 1707 07/12/22 0442 07/13/22 0318 07/14/22 0422 07/15/22 0657  WBC 12.9* 14.7* 12.8* 10.0 10.3  NEUTROABS 7.5  --   --   --   --   HGB 13.1 13.0 12.2 11.6* 11.7*  HCT 40.2 40.1 38.0 36.2 36.6  MCV 87.0 86.4 87.0 88.3 87.4  PLT 226 220 194 175 180   Cardiac Enzymes: No results for input(s): "CKTOTAL", "CKMB", "CKMBINDEX", "TROPONINI" in the last 168 hours. BNP: BNP (last 3 results) Recent Labs    07/11/22 1701 07/11/22 2300  BNP 55.7 119.9*    ProBNP (last 3 results) No results for input(s): "PROBNP" in the last 8760 hours.  CBG: Recent Labs  Lab 07/14/22 1628 07/14/22 2025 07/14/22 2343 07/15/22 0434 07/15/22 0737  GLUCAP 163* 125* 128* 123* 121*       Signed:  Desma Maxim MD.  [facility] 07/15/2022, 11:22 AM '

## 2022-07-15 NOTE — Evaluation (Signed)
Physical Therapy Evaluation Patient Details Name: Yvonne Newton MRN: [id number] DOB: 1941/03/28 Today's Date: 07/15/2022  History of Present Illness  Pt is a 81 yo female that presented to ED for SOB, chest/back pain, leg swelling. Underwent cardiac cath, per cardiology stress cardiomyopathy versus NSTEMI. will you send me her PMH from your yellow sticky note please!   Now  [employer]  [employer], Student-PT  NIDDM, HTN, G1 DD on echo 2019, with no history of CAD, cerebellar stroke in 2019, mild dementia and history of ventricular tachycardia.   Clinical Impression  Patient alert, agreeable to PT, oriented x4. Pt stated at home she is independent/modI with rollator or canes, family assists with med management and cooking/cleaning. 1 fall recently due to the pt's dog.   The patient was able to perform bed mobility modI. Sit <> stand with RW and supervision, pt needed verbal cues for hand placement (uses rollator at baseline). She ambulated ~180ft with no LOB, HR in the 70s, no complaints. The patient demonstrated and reported return to baseline level of functioning, no further acute PT needs indicated. PT to sign off. Please reconsult PT if pt status changes or acute needs are identified.          Recommendations for follow up therapy are one component of a multi-disciplinary discharge planning process, led by the attending physician.  Recommendations may be updated based on patient status, additional functional criteria and insurance authorization.  Follow Up Recommendations No PT follow up      Assistance Recommended at Discharge PRN  Patient can return home with the following  Direct supervision/assist for medications management;Assistance with cooking/housework    Equipment Recommendations None recommended by PT  Recommendations for Other Services       Functional Status Assessment Patient has not had a recent decline in their functional status     Precautions / Restrictions  Precautions Precautions: Fall Restrictions Weight Bearing Restrictions: No      Mobility  Bed Mobility Overal bed mobility: Modified Independent Bed Mobility: Supine to Sit, Sit to Supine                Transfers Overall transfer level: Needs assistance Equipment used: Rolling walker (2 wheels) Transfers: Sit to/from Stand Sit to Stand: Supervision                Ambulation/Gait Ambulation/Gait assistance: Modified independent (Device/Increase time) Gait Distance (Feet): 110 Feet Assistive device: Rolling walker (2 wheels)         General Gait Details: cued for RW use (uses rollator at baseline)  Careers information officer    Modified Rankin (Stroke Patients Only)       Balance Overall balance assessment: Needs assistance Sitting-balance support: Feet supported Sitting balance-Leahy Scale: Good       Standing balance-Leahy Scale: Fair                               Pertinent Vitals/Pain Pain Assessment Pain Assessment: No/denies pain    Home Living Family/patient expects to be discharged to:: Private residence Living Arrangements:  (adult children at home) Available Help at Discharge: Family;Available PRN/intermittently Type of Home: House Home Access: Ramped entrance       Home Layout: One level;Full bath on main level Home Equipment: Rollator (4 wheels);Cane - single point      Prior Function Prior Level of Function : Independent/Modified  Independent             Mobility Comments: family assists with IADLs       Hand Dominance        Extremity/Trunk Assessment   Upper Extremity Assessment Upper Extremity Assessment: Overall WFL for tasks assessed    Lower Extremity Assessment Lower Extremity Assessment: Overall WFL for tasks assessed    Cervical / Trunk Assessment Cervical / Trunk Assessment: Normal  Communication   Communication: No difficulties  Cognition Arousal/Alertness:  Awake/alert Behavior During Therapy: WFL for tasks assessed/performed Overall Cognitive Status: Within Functional Limits for tasks assessed                                          General Comments      Exercises     Assessment/Plan    PT Assessment Patient does not need any further PT services  PT Problem List         PT Treatment Interventions      PT Goals (Current goals can be found in the Care Plan section)       Frequency       Co-evaluation               AM-PAC PT "6 Clicks" Mobility  Outcome Measure Help needed turning from your back to your side while in a flat bed without using bedrails?: None Help needed moving from lying on your back to sitting on the side of a flat bed without using bedrails?: None Help needed moving to and from a bed to a chair (including a wheelchair)?: None Help needed standing up from a chair using your arms (e.g., wheelchair or bedside chair)?: None Help needed to walk in hospital room?: None Help needed climbing 3-5 steps with a railing? : None 6 Click Score: 24    End of Session Equipment Utilized During Treatment: Gait belt Activity Tolerance: Patient tolerated treatment well Patient left: in bed;with call bell/phone within reach;with bed alarm set Nurse Communication: Mobility status PT Visit Diagnosis: Other abnormalities of gait and mobility (R26.89)    Time: [id number] PT Time Calculation (min) (ACUTE ONLY): 26 min   Charges:   PT Evaluation $PT Eval Low Complexity: 1 Low PT Treatments $Therapeutic Activity: 8-22 mins        Lieutenant Diego PT, DPT 11:55 AM,07/15/22

## 2022-07-15 NOTE — Progress Notes (Signed)
Rounding Note    Patient Name: Yvonne Newton Date of Encounter: 07/15/2022  [facility] Cardiologist: None  New consult done by Dr. Mariah Milling  Subjective   Patient seen on a.m. rounds.  Denies chest pain, shortness of breath, palpitations.  Underwent left heart catheterization yesterday without difficulty.  She says she is ready to go home today  Inpatient Medications    Scheduled Meds:  aspirin EC  81 mg Oral Daily   carvedilol  12.5 mg Oral BID   clopidogrel  75 mg Oral Q breakfast   diltiazem  120 mg Oral Q48H   furosemide  40 mg Oral Daily   insulin aspart  0-15 Units Subcutaneous Q4H   loratadine  10 mg Oral Daily   losartan  25 mg Oral Daily   memantine  10 mg Oral BID   pantoprazole  40 mg Oral Daily   rosuvastatin  5 mg Oral q1800   sodium chloride flush  3 mL Intravenous Q12H   sodium chloride flush  3 mL Intravenous Q12H   Continuous Infusions:  sodium chloride Stopped (07/14/22 0022)   sodium chloride     PRN Meds: sodium chloride, sodium chloride, acetaminophen, nitroGLYCERIN, ondansetron **OR** ondansetron (ZOFRAN) IV, sodium chloride flush   Vital Signs    Vitals:   07/14/22 2340 07/15/22 0441 07/15/22 0734 07/15/22 1226  BP: (!) 132/51 (!) 139/48 (!) 128/52 (!) 118/59  Pulse: 71 64 72 75  Resp: 20 20 17 18   Temp: 98.6 F (37 C) 98 F (36.7 C) 98 F (36.7 C) 97.8 F (36.6 C)  TempSrc: Oral  Oral Oral  SpO2: 93% 94% 92% 95%  Weight:      Height:        Intake/Output Summary (Last 24 hours) at 07/15/2022 1233 Last data filed at 07/15/2022 1051 Gross per 24 hour  Intake 900 ml  Output 675 ml  Net 225 ml      07/12/2022    5:00 AM 07/11/2022    8:08 PM 05/07/2022    2:33 PM  Last 3 Weights  Weight (lbs) 173 lb 12.8 oz 175 lb 6.4 oz 172 lb  Weight (kg) 78.835 kg 79.561 kg 78.019 kg      Telemetry    Sinus rhythm rates of 60-70- Personally Reviewed  ECG    No new tracings- Personally Reviewed  Physical Exam    GEN: No acute distress.   Neck: No JVD Cardiac: RRR, no murmurs, rubs, or gallops.  Right radial wrist with a 2+ pulse, OpSite and gauze dressing that is clean, dry, and intact.  There is no bleeding or hematoma noted at this site. Respiratory: Clear to auscultation bilaterally.  Respirations are unlabored at rest on room air GI: Soft, nontender, non-distended  MS: No edema; No deformity. Neuro:  Nonfocal  Psych: Normal affect   Labs    High Sensitivity Troponin:   Recent Labs  Lab 07/11/22 1707 07/11/22 1922 07/11/22 2300 07/12/22 0045  TROPONINIHS 86* 259* 459* 494*     Chemistry Recent Labs  Lab 07/11/22 1707 07/13/22 0318 07/13/22 0852 07/14/22 0422 07/15/22 0657  NA 139 138  --  140 142  K 4.3 3.3*  --  3.8 3.8  CL 106 104  --  105 105  CO2 25 29  --  26 29  GLUCOSE 110* 136*  --  138* 123*  BUN 20 19  --  27* 25*  CREATININE 0.55 0.81  --  0.85 0.74  CALCIUM  9.4 8.4*  --  9.4 9.2  MG  --   --  1.7 2.3  --   PROT 7.3  --   --   --   --   ALBUMIN 4.1  --   --   --   --   AST 24  --   --   --   --   ALT 18  --   --   --   --   ALKPHOS 50  --   --   --   --   BILITOT 1.2  --   --   --   --   GFRNONAA >60 >60  --  >60 >60  ANIONGAP 8 5  --  9 8    Lipids No results for input(s): "CHOL", "TRIG", "HDL", "LABVLDL", "LDLCALC", "CHOLHDL" in the last 168 hours.  Hematology Recent Labs  Lab 07/13/22 0318 07/14/22 0422 07/15/22 0657  WBC 12.8* 10.0 10.3  RBC 4.37 4.10 4.19  HGB 12.2 11.6* 11.7*  HCT 38.0 36.2 36.6  MCV 87.0 88.3 87.4  MCH 27.9 28.3 27.9  MCHC 32.1 32.0 32.0  RDW 13.6 13.7 13.8  PLT 194 175 180   Thyroid No results for input(s): "TSH", "FREET4" in the last 168 hours.  BNP Recent Labs  Lab 07/11/22 1701 07/11/22 2300  BNP 55.7 119.9*    DDimer  Recent Labs  Lab 07/11/22 1707  DDIMER 0.55*     Radiology      Cardiac Studies  LHC 07/14/2022   Mid RCA lesion is 30% stenosed.   1st Mrg lesion is 30% stenosed.   Prox LAD to  Mid LAD lesion is 20% stenosed.   2nd Diag lesion is 30% stenosed.   Mid LAD lesion is 60% stenosed.   There is mild left ventricular systolic dysfunction.   LV end diastolic pressure is moderately elevated.   The left ventricular ejection fraction is 45-50% by visual estimate.   1.  Moderate one-vessel coronary arteries involving the mid LAD with 60% stenosis not significant by fractional flow reserve evaluation with an IFR of 0.91. 2.  Mildly reduced LV systolic function with hypokinesis of mid to distal left ventricular segments. 3.  Moderately elevated left ventricular end-diastolic pressure at 28 to 29 mmHg.   TTE 07/13/2022 1. Left ventricular ejection fraction, by estimation, is 50 to 55%. The  left ventricle has low normal function. The left ventricle demonstrates  regional wall motion abnormalities (Hypokinesis of the distal anteroseptal  and apical region). Left  ventricular diastolic parameters are consistent with Grade I diastolic  dysfunction (impaired relaxation).   2. Right ventricular systolic function is normal. The right ventricular  size is normal. Tricuspid regurgitation signal is inadequate for assessing  PA pressure.   3. The mitral valve is normal in structure. No evidence of mitral valve  regurgitation. No evidence of mitral stenosis.   4. The aortic valve is normal in structure. Aortic valve regurgitation is  mild. No aortic stenosis is present.   5. The inferior vena cava is normal in size with greater than 50%  respiratory variability, suggesting right atrial pressure of 3 mmHg.   Patient Profile     81 y.o. 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  Assessment & Plan  NSTEMI -Presented to the [facility]  emergency department with back, arm, chest pain concerning for angina -High-sensitivity troponins peaked at 494 -Cardiac catheterization completed 07/14/2022 which showed 60% stenosis of the mid LAD which was not significant by FFR.  I suspect her presentation is likely due to stress-induced cardiomyopathy. -She was started on clopidogrel 75 mg daily to take for 1 year -She is referred to cardiac rehab -Echocardiogram revealed LVEF of 50-55% with hypokinesis of the distal anterior septal and apical region, G1 DD, without valvular abnormalities noted. -Continued on aspirin, Coreg, Plavix, rosuvastatin -Remains chest pain-free today -Likely will be discharged home later this afternoon  Acute systolic heart failure -LVEF 50-55% -LVEDP 28 mmHg by cardiac catheterization -She received IV furosemide and was switched to oral furosemide 40 mg daily -Started on losartan 25 mg daily -Being continued on carvedilol -Daily weight, I's and O's, low-sodium diet  History of right ventricular outflow tract ventricular tachycardia -Left heart catheterization completed yesterday and flecainide was discontinued -If she develops recurrent tachycardia will require ablation or different antiarrhythmic medications such as amiodarone -We will need outpatient follow-up with EP  4.    Hyperlipidemia -LDL 56 -Continue rosuvastatin     For questions or updates, please contact [facility] Please consult [email or web address] for contact info under        Signed, Alyscia Carmon, NP  07/15/2022, 12:33 PM

## 2022-07-15 NOTE — TOC CM/SW Note (Signed)
Patient has orders to discharge home today. Chart reviewed. Patient weaned to room air. No TOC needs identified. CSW signing off.  Charlynn Court, CSW [phone]

## 2022-07-16 ENCOUNTER — Telehealth: Payer: Self-pay

## 2022-07-16 NOTE — Telephone Encounter (Signed)
Heart Failure Nurse Navigator Note  Received phone call from patients daughter.  She called stating her mother's weight was up 7 pounds.  Weighed 160# after she got home yesterday, today's weight  is 167#.  She was not weighted at the same time, had different amounts of clothing on.  When questioned she is not having increasing SOB, swelling etc.  Recommending weighing her tomorrow with the same amount of clothing on or in the nude, after she has gone to the BR and emptied her bladder and see what that weight is.  She voices understanding.  Tresa Endo RN CHFN

## 2022-07-18 ENCOUNTER — Telehealth: Payer: Self-pay

## 2022-07-18 NOTE — Telephone Encounter (Signed)
[facility] Discharge Phone Call   Called and spoke with patient, she asked that I call back speak with her daughter who is keeping track of her weights and etc.  Daughter Marylu Lund returned phone call.  States that she questions if her mom's weights are accurate as when she was home she got 167, the next day was 166 and this morning was 160.  She states that she has not on a flat hard surface that it does not carpet.  We will continue to monitor.  States that they have taken the saltshaker off the table.  Got her mother no salt butter, he is cooking fresh vegetables and lean meats.  She is also sticking with the 64 ounce fluid restriction.  She was able to get all her medications from the pharmacy and she is taking them as ordered.  The only thing she questioned is they stopped her mom's omeprazole now they noticed that she is burping a lot.  She is trying Tums.  Suggested may be running it by the family doctor.  She states they have a an appointment on November 22 and will wait till that time.  Marylu Lund will be bringing her to the heart failure appointment.  She was informed to bring her medications along with her readings for her weights and blood pressures.  She had no further questions.  Tresa Endo RN CHFN

## 2022-07-23 DIAGNOSIS — I25119 Atherosclerotic heart disease of native coronary artery with unspecified angina pectoris: Secondary | ICD-10-CM | POA: Diagnosis not present

## 2022-07-23 DIAGNOSIS — I214 Non-ST elevation (NSTEMI) myocardial infarction: Secondary | ICD-10-CM | POA: Diagnosis not present

## 2022-07-23 DIAGNOSIS — E119 Type 2 diabetes mellitus without complications: Secondary | ICD-10-CM | POA: Diagnosis not present

## 2022-07-23 DIAGNOSIS — F03A0 Unspecified dementia, mild, without behavioral disturbance, psychotic disturbance, mood disturbance, and anxiety: Secondary | ICD-10-CM | POA: Diagnosis not present

## 2022-07-23 DIAGNOSIS — K219 Gastro-esophageal reflux disease without esophagitis: Secondary | ICD-10-CM | POA: Diagnosis not present

## 2022-07-23 DIAGNOSIS — I10 Essential (primary) hypertension: Secondary | ICD-10-CM | POA: Diagnosis not present

## 2022-07-29 NOTE — Progress Notes (Deleted)
Electrophysiology Office Note Date: 07/29/2022  ID:  Yvonne Newton, Yvonne Newton [birth year], MRN [id number]  PCP: Nira Retort Primary Cardiologist: None Electrophysiologist: Lewayne Bunting, MD   CC: ILR follow-up  Yvonne Newton is a 81 y.o. female seen today for Dr. Ladona Ridgel . she presents today for post hospital follow up.    Admitted 11/10-11/14/23 with chest pain and found to have HS trop elevation and TWIs on EKG. LHC 11/13 with non-obstructive CAD. Cardiac rehab ordered. Echo 07/13/2022 with LVEF 50-55%, HK of distal anteroseptal and apical region felt to potentially be stress induced.  Since discharge from hospital the patient reports doing ***.  she denies chest pain, palpitations, dyspnea, PND, orthopnea, nausea, vomiting, dizziness, syncope, edema, weight gain, or early satiety. .  Device History: Medtronic loop recorder implanted 10/2017 for cryptogenic stroke and syncope, explanted 10/2021 at [facility]  Past Medical History:  Diagnosis Date   Arthritis 07/22/2017   Cerebellar stroke (HCC)    Diabetes mellitus type 2 in nonobese (HCC)    DM (diabetes mellitus) (HCC)    Dysarthria    Essential hypertension 07/22/2017   Hyperlipidemia, unspecified 07/22/2017   OA (osteoarthritis) of knee    right   Obesity    Stroke (HCC)    Tachycardia 07/22/2017   VENTRICULAR TACHYCARDIA 09/05/2009   Qualifier: Diagnosis of  By: Susette Racer CMA, Jewel     Ventricular tachycardia (HCC)    Vitamin B 12 deficiency    Past Surgical History:  Procedure Laterality Date   BREAST BIOPSY Left 2010   Negative   INTRAVASCULAR PRESSURE WIRE/FFR STUDY N/A 07/14/2022   Procedure: INTRAVASCULAR PRESSURE WIRE/FFR STUDY;  Surgeon: Iran Ouch, MD;  Location: [facility] INVASIVE CV LAB;  Service: Cardiovascular;  Laterality: N/A;   LEFT HEART CATH AND CORONARY ANGIOGRAPHY N/A 07/14/2022   Procedure: LEFT HEART CATH AND CORONARY ANGIOGRAPHY;  Surgeon: Iran Ouch, MD;  Location: [facility]  INVASIVE CV LAB;  Service: Cardiovascular;  Laterality: N/A;   LOOP RECORDER INSERTION N/A 11/03/2017   Procedure: LOOP RECORDER INSERTION;  Surgeon: Regan Lemming, MD;  Location: [facility] INVASIVE CV LAB;  Service: Cardiovascular;  Laterality: N/A;   TEE WITHOUT CARDIOVERSION N/A 11/03/2017   Procedure: TRANSESOPHAGEAL ECHOCARDIOGRAM (TEE);  Surgeon: Lars Masson, MD;  Location: [facility] ENDOSCOPY;  Service: Cardiovascular;  Laterality: N/A;    Current Outpatient Medications  Medication Sig Dispense Refill   aspirin EC 81 MG EC tablet Take 1 tablet (81 mg total) by mouth daily.     carvedilol (COREG) 12.5 MG tablet TAKE 1 TABLET BY MOUTH TWICE A DAY 180 tablet 3   cetirizine (ZYRTEC) 10 MG tablet Take 10 mg by mouth daily.  2   clopidogrel (PLAVIX) 75 MG tablet Take 1 tablet (75 mg total) by mouth daily with breakfast. 30 tablet 1   CYANOCOBALAMIN IJ Inject as directed every 30 (thirty) days. On or about the 26th of each month     diltiazem (CARDIZEM CD) 120 MG 24 hr capsule TAKE 1 CAPSULE BY MOUTH EVERY DAY 90 capsule 3   EPINEPHrine 0.3 mg/0.3 mL IJ SOAJ injection Inject 0.3 mg into the muscle once as needed (severe allergic reaction).     furosemide (LASIX) 40 MG tablet Take 1 tablet (40 mg total) by mouth daily. 30 tablet 1   glipiZIDE (GLUCOTROL XL) 2.5 MG 24 hr tablet Take 2.5 mg by mouth daily with breakfast.     losartan (COZAAR) 25 MG tablet Take 1 tablet (25 mg total) by mouth  daily. 30 tablet 1   memantine (NAMENDA TITRATION PACK) tablet pack Take 1 tablet by mouth in the morning and at bedtime.     metFORMIN (GLUCOPHAGE-XR) 500 MG 24 hr tablet Take 500-1,000 mg by mouth See admin instructions. Take one tablet (500 mg) by mouth every morning and two tablets (1000 mg) at bedtime     rosuvastatin (CRESTOR) 5 MG tablet Take 1 tablet (5 mg total) by mouth daily at 6 PM. 90 tablet 3   Vitamin D, Ergocalciferol, (DRISDOL) 50000 units CAPS capsule Take 50,000 Units by mouth every 7 (seven)  days. (Patient not taking: Reported on 07/11/2022)     No current facility-administered medications for this visit.    Allergies:   Blueberry flavor, Influenza vaccines, Sulfa antibiotics, Sulfonamide derivatives, and Other   Social History: Social History   Socioeconomic History   Marital status: Married    Spouse name: Not on file   Number of children: Not on file   Years of education: Not on file   Highest education level: Not on file  Occupational History   Not on file  Tobacco Use   Smoking status: Former    Types: Cigarettes    Quit date: 09/01/1998    Years since quitting: 23.9   Smokeless tobacco: Never  Vaping Use   Vaping Use: Never used  Substance and Sexual Activity   Alcohol use: No   Drug use: No   Sexual activity: Not on file  Other Topics Concern   Not on file  Social History Narrative   Not on file   Social Determinants of Health   Financial Resource Strain: Not on file  Food Insecurity: Not on file  Transportation Needs: Not on file  Physical Activity: Not on file  Stress: Not on file  Social Connections: Not on file  Intimate Partner Violence: Not on file    Family History: Family History  Problem Relation Age of Onset   Heart attack Father 67   Heart attack Brother 41   Heart attack Brother        had CABG   Breast cancer Neg Hx      Review of Systems: All other systems reviewed and are otherwise negative except as noted above.  Physical Exam: There were no vitals filed for this visit.   GEN- The patient is well appearing, alert and oriented x 3 today.   HEENT: normocephalic, atraumatic; sclera clear, conjunctiva pink; hearing intact; oropharynx clear; neck supple  Lungs- Clear to ausculation bilaterally, normal work of breathing.  No wheezes, rales, rhonchi Heart- Regular rate and rhythm, no murmurs, rubs or gallops  GI- soft, non-tender, non-distended, bowel sounds present  Extremities- no clubbing, cyanosis, or edema  MS- no  significant deformity or atrophy Skin- warm and dry, no rash or lesion; ILR pocket well healed Psych- euthymic mood, full affect Neuro- strength and sensation are intact  PPM Interrogation- reviewed in detail today,  See PACEART report  EKG:  EKG is not ordered today. The ekg ordered 07/15/22 showed  NSR at 63 bpm  Recent Labs: 07/11/2022: ALT 18; B Natriuretic Peptide 119.9 07/14/2022: Magnesium 2.3 07/15/2022: BUN 25; Creatinine, Ser 0.74; Hemoglobin 11.7; Platelets 180; Potassium 3.8; Sodium 142   Wt Readings from Last 3 Encounters:  07/12/22 173 lb 12.8 oz (78.8 kg)  05/07/22 172 lb (78 kg)  12/29/21 180 lb (81.6 kg)     Other studies Reviewed: Additional studies/ records that were reviewed today include: Echo 07/2022 shows LVEF 50-55%,  Previous EP office notes, Previous remote checks, Most recent labwork.   Assessment and Plan:  1. Syncope s/p Medtronic Loop recorder (explanted 10/2021 at [facility]) No further  2. H/o VT Flecainide now stopped with CAD as below   3. CAD Non obstructive LAD disease by cath 07/2022 Continue ASA, BB, and statin  4. HTN Stable on current regimen   Current medicines are reviewed at length with the patient today.   The patient {ACTIONS; HAS/DOES NOT HAVE:19233} concerns regarding her medicines.  The following changes were made today:  {NONE DEFAULTED:18576}  Labs/ tests ordered today include: *** No orders of the defined types were placed in this encounter.    Disposition:   Follow up with {EPMDS:28135}  in *** {Blank single:19197::"Months","Weeks"}    Signed, Shirley Friar, PA-C  07/29/2022 1:51 PM  [address] [place] [address] [phone] (office) [phone] (fax)

## 2022-07-30 NOTE — Progress Notes (Unsigned)
Patient ID: Yvonne Newton, female    DOB: 1941-08-27, 81 y.o.   MRN: [id number]  HPI  [name] is a 81 y/o female with a history of HTN, stroke, DM, hyperlipidemia, VT, tobacco use and chronic heart failure.   Echo report from 07/13/22 showed an EF of 50-55%  LHC done 07/14/22 showed:   Mid RCA lesion is 30% stenosed.   1st Mrg lesion is 30% stenosed.   Prox LAD to Mid LAD lesion is 20% stenosed.   2nd Diag lesion is 30% stenosed.   Mid LAD lesion is 60% stenosed.   There is mild left ventricular systolic dysfunction.   LV end diastolic pressure is moderately elevated.   The left ventricular ejection fraction is 45-50% by visual estimate.  1.  Moderate one-vessel coronary arteries involving the mid LAD with 60% stenosis not significant by fractional flow reserve evaluation with an IFR of 0.91. 2.  Mildly reduced LV systolic function with hypokinesis of mid to distal left ventricular segments. 3.  Moderately elevated left ventricular end-diastolic pressure at 28 to 29 mmHg.  Admitted 07/11/22 due to bilateral arm pain, upper back pain and chest pain starting while walking outside. She had associated diaphoresis, shortness of breath and nausea. Cardiology consult obtained. Cath done with nonobstructive CAD. IV lasix transitioned to oral diuretics. Flecainide stopped. Discharged after 4 days.   She presents today for her initial visit with a chief complaint of moderate fatigue with minimal exertion. Describes this as chronic in nature. She has associated chest tightness, shortness of breath, pedal edema and dizziness along with this. She denies any difficulty sleeping, abdominal distention, palpitations, chest pain, cough or weight gain.   Weighing daily ad has some weight fluctuations. Likes to add salt and says that she will eat cherry tomatoes by dipping them in salt. Daughter does the shopping and cooking and does not cook with salt. Daughter has been reading food labels and has  found the lowest sodium bread and bacon.   Drinks from a "water bottle" but unable to quantify how many ounces the bottle holds or how many she drinks in a day.   Past Medical History:  Diagnosis Date   Arthritis 07/22/2017   Cerebellar stroke (HCC)    CHF (congestive heart failure) (HCC)    Diabetes mellitus type 2 in nonobese (HCC)    DM (diabetes mellitus) (HCC)    Dysarthria    Essential hypertension 07/22/2017   Hyperlipidemia, unspecified 07/22/2017   OA (osteoarthritis) of knee    right   Obesity    Stroke (HCC)    Tachycardia 07/22/2017   VENTRICULAR TACHYCARDIA 09/05/2009   Qualifier: Diagnosis of  By: Susette Racer CMA, Jewel     Ventricular tachycardia (HCC)    Vitamin B 12 deficiency    Past Surgical History:  Procedure Laterality Date   BREAST BIOPSY Left 2010   Negative   INTRAVASCULAR PRESSURE WIRE/FFR STUDY N/A 07/14/2022   Procedure: INTRAVASCULAR PRESSURE WIRE/FFR STUDY;  Surgeon: Iran Ouch, MD;  Location: [facility] INVASIVE CV LAB;  Service: Cardiovascular;  Laterality: N/A;   LEFT HEART CATH AND CORONARY ANGIOGRAPHY N/A 07/14/2022   Procedure: LEFT HEART CATH AND CORONARY ANGIOGRAPHY;  Surgeon: Iran Ouch, MD;  Location: [facility] INVASIVE CV LAB;  Service: Cardiovascular;  Laterality: N/A;   LOOP RECORDER INSERTION N/A 11/03/2017   Procedure: LOOP RECORDER INSERTION;  Surgeon: Regan Lemming, MD;  Location: [facility] INVASIVE CV LAB;  Service: Cardiovascular;  Laterality: N/A;   TEE WITHOUT  CARDIOVERSION N/A 11/03/2017   Procedure: TRANSESOPHAGEAL ECHOCARDIOGRAM (TEE);  Surgeon: Lars Masson, MD;  Location: [facility] ENDOSCOPY;  Service: Cardiovascular;  Laterality: N/A;   Family History  Problem Relation Age of Onset   Heart attack Father 54   Heart attack Brother 66   Heart attack Brother        had CABG   Breast cancer Neg Hx    Social History   Tobacco Use   Smoking status: Former    Types: Cigarettes    Quit date: 09/01/1998    Years since quitting:  23.9   Smokeless tobacco: Never  Substance Use Topics   Alcohol use: No   Allergies  Allergen Reactions   Blueberry Flavor Hives and Shortness Of Breath   Influenza Vaccines    Sulfa Antibiotics Itching   Sulfonamide Derivatives Itching   Other Swelling and Rash    METAL    Prior to Admission medications   Medication Sig Start Date End Date Taking? Authorizing Provider  aspirin EC 81 MG EC tablet Take 1 tablet (81 mg total) by mouth daily. 11/05/17  Yes Calvert Cantor, MD  carvedilol (COREG) 12.5 MG tablet TAKE 1 TABLET BY MOUTH TWICE A DAY 12/02/21  Yes Marinus Maw, MD  cetirizine (ZYRTEC) 10 MG tablet Take 10 mg by mouth daily. 04/14/18  Yes [provider]  clopidogrel (PLAVIX) 75 MG tablet Take 1 tablet (75 mg total) by mouth daily with breakfast. 07/16/22  Yes Wouk, Wilfred Curtis, MD  CYANOCOBALAMIN IJ Inject as directed every 30 (thirty) days. On or about the 26th of each month   Yes [provider]  diltiazem (CARDIZEM CD) 120 MG 24 hr capsule TAKE 1 CAPSULE BY MOUTH EVERY DAY 11/18/21  Yes Marinus Maw, MD  furosemide (LASIX) 40 MG tablet Take 1 tablet (40 mg total) by mouth daily. 07/16/22  Yes Wouk, Wilfred Curtis, MD  glipiZIDE (GLUCOTROL XL) 2.5 MG 24 hr tablet Take 2.5 mg by mouth daily with breakfast. 06/03/22 06/03/23 Yes [provider]  losartan (COZAAR) 25 MG tablet Take 1 tablet (25 mg total) by mouth daily. 07/16/22  Yes Wouk, Wilfred Curtis, MD  metFORMIN (GLUCOPHAGE-XR) 500 MG 24 hr tablet Take 1,000 mg by mouth 2 (two) times daily with a meal. 01/25/16  Yes [provider]  rosuvastatin (CRESTOR) 5 MG tablet Take 1 tablet (5 mg total) by mouth daily at 6 PM. 01/19/18  Yes McCue, Shanda Bumps, NP  EPINEPHrine 0.3 mg/0.3 mL IJ SOAJ injection Inject 0.3 mg into the muscle once as needed (severe allergic reaction). Patient not taking: Reported on 07/31/2022    [provider]  memantine [facility] TITRATION PACK) tablet pack Take 1 tablet  by mouth in the morning and at bedtime. Patient not taking: Reported on 07/31/2022 04/17/22 04/17/23  [provider]   Review of Systems  Constitutional:  Positive for fatigue (easily). Negative for appetite change.  HENT:  Negative for congestion, postnasal drip and sore throat.   Eyes: Negative.   Respiratory:  Positive for chest tightness (pressure feeling) and shortness of breath (easily). Negative for cough and wheezing.   Cardiovascular:  Positive for leg swelling. Negative for chest pain and palpitations.  Gastrointestinal:  Negative for abdominal distention and abdominal pain.       + gas  Endocrine: Negative.   Genitourinary: Negative.   Musculoskeletal:  Negative for back pain and neck pain.  Skin: Negative.   Allergic/Immunologic: Negative.   Neurological:  Positive for dizziness and  light-headedness.  Hematological:  Negative for adenopathy. Does not bruise/bleed easily.  Psychiatric/Behavioral:  Negative for dysphoric mood and sleep disturbance. The patient is not nervous/anxious.    Vitals:   07/31/22 1242  BP: (!) 126/46  Pulse: 70  Resp: 18  SpO2: 93%  Weight: 168 lb 4 oz (76.3 kg)   Wt Readings from Last 3 Encounters:  07/31/22 168 lb 4 oz (76.3 kg)  07/12/22 173 lb 12.8 oz (78.8 kg)  05/07/22 172 lb (78 kg)   Lab Results  Component Value Date   CREATININE 0.74 07/15/2022   CREATININE 0.85 07/14/2022   CREATININE 0.81 07/13/2022   Physical Exam Vitals and nursing note reviewed. Exam conducted with a chaperone present (daughter).  Constitutional:      Appearance: Normal appearance.  HENT:     Head: Normocephalic and atraumatic.  Cardiovascular:     Rate and Rhythm: Normal rate and regular rhythm.  Pulmonary:     Effort: No respiratory distress.     Breath sounds: No wheezing or rales.  Abdominal:     General: There is distension.     Palpations: Abdomen is soft.     Tenderness: There is no abdominal tenderness.  Musculoskeletal:         General: No tenderness.     Cervical back: Normal range of motion and neck supple.     Right lower leg: Edema (1+ pitting) present.     Left lower leg: Edema (1+ pitting) present.  Skin:    General: Skin is warm and dry.  Neurological:     General: No focal deficit present.     Mental Status: She is alert and oriented to person, place, and time.     Comments: Forgetful at times  Psychiatric:        Mood and Affect: Mood normal.        Behavior: Behavior normal.   Assessment & Plan:  1: Chronic heart failure with preserved ejection fraction without structural changes (thought to be stress induced)- - NYHA class III - weighing daily; reminded to call for an overnight weight gain of > 2 pounds or a weekly weight gain of > 5 pounds - daughter is reading food labels for sodium content and doesn't cook with salt; patient does like to use salt and will eat cherry tomatoes by dipping them in salt; reviewed the importance of keeping daily sodium intact to 2000mg  - can use NoSalt or Mrs Sharilyn SitesDash - reviewed measuring how many ounces her water bottle holds so that she can keep her daily fluid intake to 60-64 ounces - ReDs clip reading today was 35% - will increase her furosemide to 80mg  daily and 40meq potassium daily - check BMP next visit - she says that she gets UTI's "easily" and "often" so may not be a good candidate for SGLT2 - BNP 07/11/22 was 119.9 - she does not take the flu vaccine  2: HTN- - BP looks good (126/46) - saw PCP Burnett Sheng([place]) 07/23/22 - BMP 07/15/22 reviewed and showed sodium 142, potassium 3.8, creatinine 0.74 & GFR > 60  3: Type 2 DM- - saw endocrinology Gershon Crane([place]) 06/03/22 - A1c 07/11/22 was 6.3%  4: History of VT- - implantable loop recorder removed 10/2021 - no more flecainide  - saw cardiology Ladona Ridgel([place]) 10/17/21  5: Lymphedema- - stage 2 - not wearing compression socks and encouraged her to start wearing the, daily with removal at bedtime - elevate legs when  sitting for long periods of time - limited  in her ability to exercise due to symptoms - consider compression boots if edema persists   Medication bottles reviewed.   Return in 2 weeks, sooner if needed.

## 2022-07-31 ENCOUNTER — Encounter: Payer: Self-pay | Admitting: Family

## 2022-07-31 ENCOUNTER — Ambulatory Visit: Payer: PPO | Attending: Family | Admitting: Family

## 2022-07-31 VITALS — BP 126/46 | HR 70 | Resp 18 | Wt 168.2 lb

## 2022-07-31 DIAGNOSIS — R5383 Other fatigue: Secondary | ICD-10-CM | POA: Insufficient documentation

## 2022-07-31 DIAGNOSIS — Z79899 Other long term (current) drug therapy: Secondary | ICD-10-CM | POA: Insufficient documentation

## 2022-07-31 DIAGNOSIS — R0602 Shortness of breath: Secondary | ICD-10-CM | POA: Diagnosis not present

## 2022-07-31 DIAGNOSIS — I5032 Chronic diastolic (congestive) heart failure: Secondary | ICD-10-CM | POA: Diagnosis not present

## 2022-07-31 DIAGNOSIS — I110 Hypertensive heart disease with heart failure: Secondary | ICD-10-CM | POA: Insufficient documentation

## 2022-07-31 DIAGNOSIS — I10 Essential (primary) hypertension: Secondary | ICD-10-CM | POA: Diagnosis not present

## 2022-07-31 DIAGNOSIS — E119 Type 2 diabetes mellitus without complications: Secondary | ICD-10-CM

## 2022-07-31 DIAGNOSIS — I890 Lymphedema, not elsewhere classified: Secondary | ICD-10-CM | POA: Diagnosis not present

## 2022-07-31 DIAGNOSIS — I4729 Other ventricular tachycardia: Secondary | ICD-10-CM

## 2022-07-31 DIAGNOSIS — Z7984 Long term (current) use of oral hypoglycemic drugs: Secondary | ICD-10-CM | POA: Diagnosis not present

## 2022-07-31 DIAGNOSIS — Z87891 Personal history of nicotine dependence: Secondary | ICD-10-CM | POA: Insufficient documentation

## 2022-07-31 DIAGNOSIS — Z8673 Personal history of transient ischemic attack (TIA), and cerebral infarction without residual deficits: Secondary | ICD-10-CM | POA: Diagnosis not present

## 2022-07-31 DIAGNOSIS — I472 Ventricular tachycardia, unspecified: Secondary | ICD-10-CM

## 2022-07-31 DIAGNOSIS — R0789 Other chest pain: Secondary | ICD-10-CM | POA: Diagnosis not present

## 2022-07-31 DIAGNOSIS — E785 Hyperlipidemia, unspecified: Secondary | ICD-10-CM | POA: Diagnosis not present

## 2022-07-31 DIAGNOSIS — I2510 Atherosclerotic heart disease of native coronary artery without angina pectoris: Secondary | ICD-10-CM | POA: Insufficient documentation

## 2022-07-31 MED ORDER — POTASSIUM CHLORIDE CRYS ER 20 MEQ PO TBCR: 40.0000 meq | EXTENDED_RELEASE_TABLET | Freq: Every day | ORAL | 3 refills | Status: DC

## 2022-07-31 MED ORDER — PANTOPRAZOLE SODIUM 20 MG PO TBEC: 20.0000 mg | DELAYED_RELEASE_TABLET | Freq: Every day | ORAL | 5 refills | Status: DC

## 2022-07-31 MED ORDER — FUROSEMIDE 40 MG PO TABS: 80.0000 mg | ORAL_TABLET | Freq: Every day | ORAL | 5 refills | Status: DC

## 2022-07-31 NOTE — Progress Notes (Signed)
ReDS Vest / Clip - 07/31/22 1242       ReDS Vest / Clip   Station Marker A    Ruler Value 30    ReDS Actual Value 35

## 2022-07-31 NOTE — Patient Instructions (Addendum)
Continue weighing daily and call for an overnight weight gain of 3 pounds or more or a weekly weight gain of more than 5 pounds.   If you have voicemail, please make sure your mailbox is cleaned out so that we may leave a message and please make sure to listen to any voicemails.    Increase your fluid pill to 2 pills daily.    You will start taking 2 potassium pills daily.    Do not add regular salt to your food. Keep daily sodium intake to 2000mg    Drink 60-64 ounces of fluid daily, mostly water.

## 2022-08-05 ENCOUNTER — Ambulatory Visit: Payer: PPO | Admitting: Student

## 2022-08-05 DIAGNOSIS — I214 Non-ST elevation (NSTEMI) myocardial infarction: Secondary | ICD-10-CM

## 2022-08-05 DIAGNOSIS — I4729 Other ventricular tachycardia: Secondary | ICD-10-CM

## 2022-08-05 DIAGNOSIS — I10 Essential (primary) hypertension: Secondary | ICD-10-CM

## 2022-08-14 ENCOUNTER — Encounter: Payer: Self-pay | Admitting: Family

## 2022-08-14 ENCOUNTER — Other Ambulatory Visit: Payer: Self-pay | Admitting: Family

## 2022-08-14 ENCOUNTER — Ambulatory Visit: Payer: PPO | Attending: Family | Admitting: Family

## 2022-08-14 ENCOUNTER — Telehealth: Payer: Self-pay | Admitting: Family

## 2022-08-14 VITALS — BP 144/56 | HR 64 | Resp 20 | Wt 167.0 lb

## 2022-08-14 DIAGNOSIS — Z8673 Personal history of transient ischemic attack (TIA), and cerebral infarction without residual deficits: Secondary | ICD-10-CM | POA: Insufficient documentation

## 2022-08-14 DIAGNOSIS — Z7984 Long term (current) use of oral hypoglycemic drugs: Secondary | ICD-10-CM | POA: Diagnosis not present

## 2022-08-14 DIAGNOSIS — R42 Dizziness and giddiness: Secondary | ICD-10-CM | POA: Insufficient documentation

## 2022-08-14 DIAGNOSIS — E119 Type 2 diabetes mellitus without complications: Secondary | ICD-10-CM | POA: Insufficient documentation

## 2022-08-14 DIAGNOSIS — I890 Lymphedema, not elsewhere classified: Secondary | ICD-10-CM | POA: Insufficient documentation

## 2022-08-14 DIAGNOSIS — I10 Essential (primary) hypertension: Secondary | ICD-10-CM

## 2022-08-14 DIAGNOSIS — I5032 Chronic diastolic (congestive) heart failure: Secondary | ICD-10-CM | POA: Insufficient documentation

## 2022-08-14 DIAGNOSIS — R252 Cramp and spasm: Secondary | ICD-10-CM | POA: Diagnosis not present

## 2022-08-14 DIAGNOSIS — I4729 Other ventricular tachycardia: Secondary | ICD-10-CM

## 2022-08-14 DIAGNOSIS — I110 Hypertensive heart disease with heart failure: Secondary | ICD-10-CM | POA: Diagnosis not present

## 2022-08-14 DIAGNOSIS — I2510 Atherosclerotic heart disease of native coronary artery without angina pectoris: Secondary | ICD-10-CM | POA: Diagnosis not present

## 2022-08-14 DIAGNOSIS — E785 Hyperlipidemia, unspecified: Secondary | ICD-10-CM | POA: Diagnosis not present

## 2022-08-14 DIAGNOSIS — R0602 Shortness of breath: Secondary | ICD-10-CM | POA: Diagnosis not present

## 2022-08-14 LAB — BASIC METABOLIC PANEL
Anion gap: 6 (ref 5–15)
BUN: 25 mg/dL — ABNORMAL HIGH (ref 8–23)
CO2: 25 mmol/L (ref 22–32)
Calcium: 8.7 mg/dL — ABNORMAL LOW (ref 8.9–10.3)
Chloride: 110 mmol/L (ref 98–111)
Creatinine, Ser: 1.29 mg/dL — ABNORMAL HIGH (ref 0.44–1.00)
GFR, Estimated: 42 mL/min — ABNORMAL LOW (ref >60)
Glucose, Bld: 101 mg/dL — ABNORMAL HIGH (ref 70–99)
Potassium: 4.6 mmol/L (ref 3.5–5.1)
Sodium: 141 mmol/L (ref 135–145)

## 2022-08-14 LAB — MAGNESIUM: Magnesium: 1.1 mg/dL — ABNORMAL LOW (ref 1.7–2.4)

## 2022-08-14 NOTE — Telephone Encounter (Signed)
Called patient's daughter, Yvonne Newton, to review lab results obtained today. She is complaining of intermittent leg cramps and has a history of VT.   Magnesium is low at 1.1. Consulted PharmD who recommends magnesium infusion of 4 grams along with 400mg  oral magnesium BID for one week. Daughter says that she can bring her tomorrow to same day surgery (after confirming with same day surgery that they can do it). Will plan on rechecking magnesium level in 2 weeks.   Daughter verbalized understanding.

## 2022-08-14 NOTE — Patient Instructions (Addendum)
Continue weighing daily and call for an overnight weight gain of 3 pounds or more or a weekly weight gain of more than 5 pounds.   If you have voicemail, please make sure your mailbox is cleaned out so that we may leave a message and please make sure to listen to any voicemails.     Go to [employer] on [address] to get legs measured for compression socks.

## 2022-08-14 NOTE — Progress Notes (Signed)
Patient ID: Yvonne Newton, female    DOB: [birth year], 81 y.o.   MRN: [id number]  HPI  Yvonne Newton is a 81 y/o female with a history of HTN, stroke, DM, hyperlipidemia, VT, tobacco use and chronic heart failure.   Echo report from 07/13/22 showed an EF of 50-55%  LHC done 07/14/22 showed:   Mid RCA lesion is 30% stenosed.   1st Mrg lesion is 30% stenosed.   Prox LAD to Mid LAD lesion is 20% stenosed.   2nd Diag lesion is 30% stenosed.   Mid LAD lesion is 60% stenosed.   There is mild left ventricular systolic dysfunction.   LV end diastolic pressure is moderately elevated.   The left ventricular ejection fraction is 45-50% by visual estimate.  1.  Moderate one-vessel coronary arteries involving the mid LAD with 60% stenosis not significant by fractional flow reserve evaluation with an IFR of 0.91. 2.  Mildly reduced LV systolic function with hypokinesis of mid to distal left ventricular segments. 3.  Moderately elevated left ventricular end-diastolic pressure at 28 to 29 mmHg.  Admitted 07/11/22 due to bilateral arm pain, upper back pain and chest pain starting while walking outside. She had associated diaphoresis, shortness of breath and nausea. Cardiology consult obtained. Cath done with nonobstructive CAD. IV lasix transitioned to oral diuretics. Flecainide stopped. Discharged after 4 days.   She presents today for a follow-up visit with a chief complaint of moderate shortness of breath with minimal exertion. Describes this as chronic in nature. Has associated fatigue, pedal edema, dizziness and leg cramps along with this. Denies any difficulty sleeping, abdominal distention, palpitations, chest pain, wheezing, cough or weight gain.   Has worn compression socks in the past but says that they were too tight and cut into her legs.   Past Medical History:  Diagnosis Date   Arthritis 07/22/2017   Cerebellar stroke (HCC)    CHF (congestive heart failure) (HCC)    Diabetes  mellitus type 2 in nonobese (HCC)    DM (diabetes mellitus) (HCC)    Dysarthria    Essential hypertension 07/22/2017   Hyperlipidemia, unspecified 07/22/2017   OA (osteoarthritis) of knee    right   Obesity    Stroke (HCC)    Tachycardia 07/22/2017   VENTRICULAR TACHYCARDIA 09/05/2009   Qualifier: Diagnosis of  By: Susette RacerHardy CMA, Jewel     Ventricular tachycardia (HCC)    Vitamin B 12 deficiency    Past Surgical History:  Procedure Laterality Date   BREAST BIOPSY Left 2010   Negative   INTRAVASCULAR PRESSURE WIRE/FFR STUDY N/A 07/14/2022   Procedure: INTRAVASCULAR PRESSURE WIRE/FFR STUDY;  Surgeon: Iran OuchArida, Muhammad A, MD;  Location: [facility] INVASIVE CV LAB;  Service: Cardiovascular;  Laterality: N/A;   LEFT HEART CATH AND CORONARY ANGIOGRAPHY N/A 07/14/2022   Procedure: LEFT HEART CATH AND CORONARY ANGIOGRAPHY;  Surgeon: Iran OuchArida, Muhammad A, MD;  Location: [facility] INVASIVE CV LAB;  Service: Cardiovascular;  Laterality: N/A;   LOOP RECORDER INSERTION N/A 11/03/2017   Procedure: LOOP RECORDER INSERTION;  Surgeon: Regan Lemmingamnitz, Will Martin, MD;  Location: [facility] INVASIVE CV LAB;  Service: Cardiovascular;  Laterality: N/A;   TEE WITHOUT CARDIOVERSION N/A 11/03/2017   Procedure: TRANSESOPHAGEAL ECHOCARDIOGRAM (TEE);  Surgeon: Lars MassonNelson, Katarina H, MD;  [facility];  Service: Cardiovascular;  Laterality: N/A;   Family History  Problem Relation Age of Onset   Heart attack Father 8065   Heart attack Brother 48   Heart attack Brother  had CABG   Breast cancer Neg Hx    Social History   Tobacco Use   Smoking status: Former    Types: Cigarettes    Quit date: 09/01/1998    Years since quitting: 23.9   Smokeless tobacco: Never  Substance Use Topics   Alcohol use: No   Allergies  Allergen Reactions   Blueberry Flavor Hives and Shortness Of Breath   Influenza Vaccines    Sulfa Antibiotics Itching   Sulfonamide Derivatives Itching   Other Swelling and Rash    METAL   Prior to Admission  medications   Medication Sig Start Date End Date Taking? Authorizing Provider  aspirin EC 81 MG EC tablet Take 1 tablet (81 mg total) by mouth daily. 11/05/17  Yes Calvert Cantor, MD  carvedilol (COREG) 12.5 MG tablet TAKE 1 TABLET BY MOUTH TWICE A DAY 12/02/21  Yes Marinus Maw, MD  cetirizine (ZYRTEC) 10 MG tablet Take 10 mg by mouth daily. 04/14/18  Yes [provider]  clopidogrel (PLAVIX) 75 MG tablet Take 1 tablet (75 mg total) by mouth daily with breakfast. 07/16/22  Yes Wouk, Wilfred Curtis, MD  CYANOCOBALAMIN IJ Inject as directed every 30 (thirty) days. On or about the 26th of each month   Yes [provider]  diltiazem (CARDIZEM CD) 120 MG 24 hr capsule TAKE 1 CAPSULE BY MOUTH EVERY DAY 11/18/21  Yes Marinus Maw, MD  furosemide (LASIX) 40 MG tablet Take 2 tablets (80 mg total) by mouth daily. 07/31/22  Yes Clarisa Kindred A, FNP  glipiZIDE (GLUCOTROL XL) 2.5 MG 24 hr tablet Take 2.5 mg by mouth daily with breakfast. 06/03/22 06/03/23 Yes [provider]  losartan (COZAAR) 25 MG tablet Take 1 tablet (25 mg total) by mouth daily. 07/16/22  Yes Wouk, Wilfred Curtis, MD  memantine [facility] TITRATION PACK) tablet pack Take 1 tablet by mouth in the morning and at bedtime. 04/17/22 04/17/23 Yes [provider]  metFORMIN (GLUCOPHAGE-XR) 500 MG 24 hr tablet Take 1,000 mg by mouth 2 (two) times daily with a meal. 01/25/16  Yes [provider]  pantoprazole (PROTONIX) 20 MG tablet Take 1 tablet (20 mg total) by mouth daily. 07/31/22  Yes Makai Agostinelli, Inetta Fermo A, FNP  potassium chloride SA (KLOR-CON M) 20 MEQ tablet Take 2 tablets (40 mEq total) by mouth daily. 07/31/22  Yes Clarisa Kindred A, FNP  rosuvastatin (CRESTOR) 5 MG tablet Take 1 tablet (5 mg total) by mouth daily at 6 PM. 01/19/18  Yes McCue, Shanda Bumps, NP  EPINEPHrine 0.3 mg/0.3 mL IJ SOAJ injection Inject 0.3 mg into the muscle once as needed (severe allergic reaction). Patient not taking: Reported on 07/31/2022     [provider]   Review of Systems  Constitutional:  Positive for fatigue (easily). Negative for appetite change.  HENT:  Negative for congestion, postnasal drip and sore throat.   Eyes: Negative.   Respiratory:  Positive for shortness of breath (easily). Negative for cough, chest tightness and wheezing.   Cardiovascular:  Positive for leg swelling. Negative for chest pain and palpitations.  Gastrointestinal:  Negative for abdominal distention and abdominal pain.       + gas  Endocrine: Negative.   Genitourinary: Negative.   Musculoskeletal:  Negative for back pain and neck pain.  Skin: Negative.   Allergic/Immunologic: Negative.   Neurological:  Positive for dizziness and light-headedness.       Leg cramps  Hematological:  Negative for adenopathy. Does not bruise/bleed easily.  Psychiatric/Behavioral:  Negative for dysphoric mood and sleep disturbance. The patient is not nervous/anxious.    Vitals:   08/14/22 1310  BP: (!) 144/56  Pulse: 64  Resp: 20  SpO2: 98%  Weight: 167 lb (75.8 kg)   Wt Readings from Last 3 Encounters:  08/14/22 167 lb (75.8 kg)  07/31/22 168 lb 4 oz (76.3 kg)  07/12/22 173 lb 12.8 oz (78.8 kg)   Lab Results  Component Value Date   CREATININE 0.74 07/15/2022   CREATININE 0.85 07/14/2022   CREATININE 0.81 07/13/2022   Physical Exam Vitals and nursing note reviewed. Exam conducted with a chaperone present (daughter).  Constitutional:      Appearance: Normal appearance.  HENT:     Head: Normocephalic and atraumatic.  Cardiovascular:     Rate and Rhythm: Normal rate and regular rhythm.  Pulmonary:     Effort: No respiratory distress.     Breath sounds: No wheezing or rales.  Abdominal:     General: There is distension.     Palpations: Abdomen is soft.     Tenderness: There is no abdominal tenderness.  Musculoskeletal:        General: No tenderness.     Cervical back: Normal range of motion and neck supple.     Right lower leg:  Edema (1+ pitting) present.     Left lower leg: Edema (1+ pitting) present.  Skin:    General: Skin is warm and dry.  Neurological:     General: No focal deficit present.     Mental Status: She is alert and oriented to person, place, and time.     Comments: Forgetful at times  Psychiatric:        Mood and Affect: Mood normal.        Behavior: Behavior normal.   Assessment & Plan:  1: Chronic heart failure with preserved ejection fraction without structural changes (thought to be stress induced)- - NYHA class III - weighing daily; reminded to call for an overnight weight gain of > 2 pounds or a weekly weight gain of > 5 pounds - weight down 1.4 pounds from last visit here 2 weeks ago - daughter is reading food labels for sodium content and doesn't cook with salt; patient does like to use salt and will eat cherry tomatoes by dipping them in salt; reviewed the importance of keeping daily sodium intact to 2000mg  - can use NoSalt or Mrs - reviewed measuring how many ounces her water bottle holds so that she can keep her daily fluid  - check BMP/ Mg today as diuretic/ potassium was increased at last visit & she's experiencing intermittent leg cramps - she says that she gets UTI's "easily" and "often" so may not be a good candidate for SGLT2 - BNP 07/11/22 was 119.9 - she does not take the flu vaccine  2: HTN- - BP mildly elevated (144/56) - saw PCP 13/10/23) 07/23/22; returns 10/2022 - BMP 07/15/22 reviewed and showed sodium 142, potassium 3.8, creatinine 0.74 & GFR > 60  3: Type 2 DM- - saw endocrinology 07/17/22) 06/03/22; returns 12/2022 - A1c 07/11/22 was 6.3%  4: History of VT- - implantable loop recorder removed 10/2021 - no more flecainide  - saw cardiology 10/2021) 10/17/21  5: Lymphedema- - stage 2 - not wearing compression socks - encouraged to go to [facility] store and get measured for compression socks - elevate legs when sitting for long periods  of time - limited in her ability to exercise  due to symptoms - consider compression boots if edema persists   Medication bottles reviewed.   Return in 3 months, sooner if needed.

## 2022-08-15 ENCOUNTER — Ambulatory Visit
Admission: RE | Admit: 2022-08-15 | Discharge: 2022-08-15 | Disposition: A | Discharge status: Home / Self Care | Payer: PPO | Source: Ambulatory Visit | Attending: Family | Admitting: Family

## 2022-08-15 ENCOUNTER — Other Ambulatory Visit: Payer: Self-pay | Admitting: Family

## 2022-08-15 DIAGNOSIS — I5032 Chronic diastolic (congestive) heart failure: Secondary | ICD-10-CM

## 2022-08-15 MED ORDER — MAGNESIUM SULFATE 4 GM/100ML IV SOLN
4.0000 g | Freq: Once | INTRAVENOUS | Status: AC
  Administered 2022-08-15: 4 g via INTRAVENOUS
  Filled 2022-08-15: qty 100

## 2022-08-15 MED ORDER — MAGNESIUM SULFATE 4 GM/100ML IV SOLN
4.0000 g | Freq: Once | INTRAVENOUS | Status: DC
  Filled 2022-08-15: qty 100

## 2022-08-21 DIAGNOSIS — Z87891 Personal history of nicotine dependence: Secondary | ICD-10-CM | POA: Diagnosis not present

## 2022-08-21 DIAGNOSIS — Z6833 Body mass index (BMI) 33.0-33.9, adult: Secondary | ICD-10-CM | POA: Diagnosis not present

## 2022-08-21 DIAGNOSIS — I509 Heart failure, unspecified: Secondary | ICD-10-CM | POA: Diagnosis not present

## 2022-08-21 DIAGNOSIS — I110 Hypertensive heart disease with heart failure: Secondary | ICD-10-CM | POA: Diagnosis not present

## 2022-08-28 ENCOUNTER — Other Ambulatory Visit: Payer: Self-pay | Admitting: Obstetrics and Gynecology

## 2022-08-29 ENCOUNTER — Encounter
Admission: RE | Admit: 2022-08-29 | Discharge: 2022-08-29 | Disposition: A | Discharge status: Home / Self Care | Payer: PPO | Source: Ambulatory Visit | Attending: Family | Admitting: Family

## 2022-08-29 ENCOUNTER — Telehealth: Payer: Self-pay

## 2022-08-29 DIAGNOSIS — Z01812 Encounter for preprocedural laboratory examination: Secondary | ICD-10-CM | POA: Insufficient documentation

## 2022-08-29 DIAGNOSIS — I5032 Chronic diastolic (congestive) heart failure: Secondary | ICD-10-CM | POA: Insufficient documentation

## 2022-08-29 LAB — BASIC METABOLIC PANEL
Anion gap: 12 (ref 5–15)
BUN: 43 mg/dL — ABNORMAL HIGH (ref 8–23)
CO2: 28 mmol/L (ref 22–32)
Calcium: 8.9 mg/dL (ref 8.9–10.3)
Chloride: 100 mmol/L (ref 98–111)
Creatinine, Ser: 1.73 mg/dL — ABNORMAL HIGH (ref 0.44–1.00)
GFR, Estimated: 29 mL/min — ABNORMAL LOW (ref >60)
Glucose, Bld: 100 mg/dL — ABNORMAL HIGH (ref 70–99)
Potassium: 3.9 mmol/L (ref 3.5–5.1)
Sodium: 140 mmol/L (ref 135–145)

## 2022-08-29 LAB — MAGNESIUM: Magnesium: 1.3 mg/dL — ABNORMAL LOW (ref 1.7–2.4)

## 2022-08-29 NOTE — Telephone Encounter (Addendum)
Spkoe with pt and patient's daughter, Marylu Lund. They verbalized understanding and state they will start magnesium supplement today and will decrease furosemide to once daily. Patient asks to come back for labs on 09/05/22 at 11 AM. Provider notified and lab orders placed. Suanne Marker, RN ----- Message from Delma Freeze, FNP sent at 08/29/2022 12:29 PM EST ----- Magnesium is still low. Needs to be taking magnesium 400mg  twice a day.  Kidney function is worse so decrease furosemide to 40mg  once daily. This needs to be rechecked in 1-2 weeks.

## 2022-09-05 ENCOUNTER — Encounter
Admission: RE | Admit: 2022-09-05 | Discharge: 2022-09-05 | Disposition: A | Discharge status: Home / Self Care | Payer: PPO | Source: Ambulatory Visit | Attending: Family | Admitting: Family

## 2022-09-05 ENCOUNTER — Other Ambulatory Visit: Payer: Self-pay | Admitting: *Deleted

## 2022-09-05 DIAGNOSIS — I5032 Chronic diastolic (congestive) heart failure: Secondary | ICD-10-CM | POA: Insufficient documentation

## 2022-09-05 DIAGNOSIS — Z01812 Encounter for preprocedural laboratory examination: Secondary | ICD-10-CM | POA: Diagnosis not present

## 2022-09-05 LAB — BASIC METABOLIC PANEL
Anion gap: 14 (ref 5–15)
BUN: 31 mg/dL — ABNORMAL HIGH (ref 8–23)
CO2: 28 mmol/L (ref 22–32)
Calcium: 9.9 mg/dL (ref 8.9–10.3)
Chloride: 98 mmol/L (ref 98–111)
Creatinine, Ser: 1.56 mg/dL — ABNORMAL HIGH (ref 0.44–1.00)
GFR, Estimated: 33 mL/min — ABNORMAL LOW (ref >60)
Glucose, Bld: 94 mg/dL (ref 70–99)
Potassium: 4 mmol/L (ref 3.5–5.1)
Sodium: 140 mmol/L (ref 135–145)

## 2022-09-05 LAB — MAGNESIUM: Magnesium: 2.3 mg/dL (ref 1.7–2.4)

## 2022-10-06 DIAGNOSIS — R519 Headache, unspecified: Secondary | ICD-10-CM | POA: Diagnosis not present

## 2022-10-06 DIAGNOSIS — F03A0 Unspecified dementia, mild, without behavioral disturbance, psychotic disturbance, mood disturbance, and anxiety: Secondary | ICD-10-CM | POA: Diagnosis not present

## 2022-10-06 DIAGNOSIS — I252 Old myocardial infarction: Secondary | ICD-10-CM | POA: Diagnosis not present

## 2022-10-06 DIAGNOSIS — I10 Essential (primary) hypertension: Secondary | ICD-10-CM | POA: Diagnosis not present

## 2022-10-06 DIAGNOSIS — E118 Type 2 diabetes mellitus with unspecified complications: Secondary | ICD-10-CM | POA: Diagnosis not present

## 2022-10-06 DIAGNOSIS — Z8673 Personal history of transient ischemic attack (TIA), and cerebral infarction without residual deficits: Secondary | ICD-10-CM | POA: Diagnosis not present

## 2022-10-06 DIAGNOSIS — Z79899 Other long term (current) drug therapy: Secondary | ICD-10-CM | POA: Diagnosis not present

## 2022-10-21 DIAGNOSIS — R059 Cough, unspecified: Secondary | ICD-10-CM | POA: Diagnosis not present

## 2022-10-31 ENCOUNTER — Ambulatory Visit (HOSPITAL_COMMUNITY)
Admission: RE | Admit: 2022-10-31 | Discharge: 2022-10-31 | Disposition: A | Discharge status: Home / Self Care | Payer: PPO | Source: Ambulatory Visit | Attending: Neurology | Admitting: Neurology

## 2022-10-31 DIAGNOSIS — F03A0 Unspecified dementia, mild, without behavioral disturbance, psychotic disturbance, mood disturbance, and anxiety: Secondary | ICD-10-CM | POA: Diagnosis not present

## 2022-10-31 DIAGNOSIS — R413 Other amnesia: Secondary | ICD-10-CM | POA: Diagnosis not present

## 2022-11-12 ENCOUNTER — Telehealth: Payer: Self-pay | Admitting: Family

## 2022-11-12 ENCOUNTER — Encounter: Payer: PPO | Admitting: Family

## 2022-11-12 NOTE — Telephone Encounter (Signed)
Patient did not show for her [facility] appointment on 11/12/22.

## 2022-11-30 ENCOUNTER — Other Ambulatory Visit: Payer: Self-pay | Admitting: Obstetrics and Gynecology

## 2022-12-04 DIAGNOSIS — E559 Vitamin D deficiency, unspecified: Secondary | ICD-10-CM | POA: Diagnosis not present

## 2022-12-04 DIAGNOSIS — E1165 Type 2 diabetes mellitus with hyperglycemia: Secondary | ICD-10-CM | POA: Diagnosis not present

## 2022-12-04 DIAGNOSIS — M810 Age-related osteoporosis without current pathological fracture: Secondary | ICD-10-CM | POA: Diagnosis not present

## 2022-12-04 DIAGNOSIS — E1159 Type 2 diabetes mellitus with other circulatory complications: Secondary | ICD-10-CM | POA: Diagnosis not present

## 2022-12-04 DIAGNOSIS — I152 Hypertension secondary to endocrine disorders: Secondary | ICD-10-CM | POA: Diagnosis not present

## 2022-12-07 ENCOUNTER — Other Ambulatory Visit: Payer: Self-pay | Admitting: Internal Medicine

## 2022-12-07 DIAGNOSIS — I10 Essential (primary) hypertension: Secondary | ICD-10-CM

## 2022-12-25 DIAGNOSIS — M810 Age-related osteoporosis without current pathological fracture: Secondary | ICD-10-CM | POA: Diagnosis not present

## 2022-12-25 DIAGNOSIS — I152 Hypertension secondary to endocrine disorders: Secondary | ICD-10-CM | POA: Diagnosis not present

## 2022-12-25 DIAGNOSIS — E1165 Type 2 diabetes mellitus with hyperglycemia: Secondary | ICD-10-CM | POA: Diagnosis not present

## 2022-12-25 DIAGNOSIS — E1159 Type 2 diabetes mellitus with other circulatory complications: Secondary | ICD-10-CM | POA: Diagnosis not present

## 2022-12-30 ENCOUNTER — Other Ambulatory Visit: Payer: Self-pay | Admitting: Internal Medicine

## 2022-12-30 DIAGNOSIS — I10 Essential (primary) hypertension: Secondary | ICD-10-CM

## 2023-01-07 DIAGNOSIS — L03011 Cellulitis of right finger: Secondary | ICD-10-CM | POA: Diagnosis not present

## 2023-01-07 DIAGNOSIS — M19042 Primary osteoarthritis, left hand: Secondary | ICD-10-CM | POA: Diagnosis not present

## 2023-01-07 DIAGNOSIS — F03A0 Unspecified dementia, mild, without behavioral disturbance, psychotic disturbance, mood disturbance, and anxiety: Secondary | ICD-10-CM | POA: Diagnosis not present

## 2023-01-07 DIAGNOSIS — M19041 Primary osteoarthritis, right hand: Secondary | ICD-10-CM | POA: Diagnosis not present

## 2023-01-07 DIAGNOSIS — E119 Type 2 diabetes mellitus without complications: Secondary | ICD-10-CM | POA: Diagnosis not present

## 2023-01-19 ENCOUNTER — Other Ambulatory Visit: Payer: Self-pay | Admitting: Internal Medicine

## 2023-01-19 ENCOUNTER — Other Ambulatory Visit: Payer: Self-pay | Admitting: Family

## 2023-01-19 DIAGNOSIS — I10 Essential (primary) hypertension: Secondary | ICD-10-CM

## 2023-01-20 NOTE — Telephone Encounter (Signed)
Refill Request.  

## 2023-01-27 ENCOUNTER — Other Ambulatory Visit: Payer: Self-pay | Admitting: Internal Medicine

## 2023-02-02 ENCOUNTER — Other Ambulatory Visit: Payer: Self-pay | Admitting: Internal Medicine

## 2023-02-06 ENCOUNTER — Other Ambulatory Visit: Payer: Self-pay | Admitting: Internal Medicine

## 2023-02-06 DIAGNOSIS — I10 Essential (primary) hypertension: Secondary | ICD-10-CM

## 2023-03-03 ENCOUNTER — Other Ambulatory Visit: Payer: Self-pay | Admitting: Internal Medicine

## 2023-03-03 DIAGNOSIS — I10 Essential (primary) hypertension: Secondary | ICD-10-CM

## 2023-03-17 ENCOUNTER — Other Ambulatory Visit: Payer: Self-pay | Admitting: Internal Medicine

## 2023-03-17 ENCOUNTER — Other Ambulatory Visit: Payer: Self-pay | Admitting: Family

## 2023-03-17 DIAGNOSIS — I10 Essential (primary) hypertension: Secondary | ICD-10-CM

## 2023-03-19 ENCOUNTER — Ambulatory Visit: Payer: PPO | Attending: Family | Admitting: Family

## 2023-03-19 ENCOUNTER — Encounter: Payer: Self-pay | Admitting: Family

## 2023-03-19 VITALS — BP 110/48 | HR 74 | Ht 60.0 in | Wt 153.0 lb

## 2023-03-19 DIAGNOSIS — E785 Hyperlipidemia, unspecified: Secondary | ICD-10-CM | POA: Insufficient documentation

## 2023-03-19 DIAGNOSIS — F0390 Unspecified dementia without behavioral disturbance: Secondary | ICD-10-CM | POA: Insufficient documentation

## 2023-03-19 DIAGNOSIS — E119 Type 2 diabetes mellitus without complications: Secondary | ICD-10-CM | POA: Diagnosis not present

## 2023-03-19 DIAGNOSIS — Z87440 Personal history of urinary (tract) infections: Secondary | ICD-10-CM | POA: Insufficient documentation

## 2023-03-19 DIAGNOSIS — Z87891 Personal history of nicotine dependence: Secondary | ICD-10-CM | POA: Diagnosis not present

## 2023-03-19 DIAGNOSIS — I2510 Atherosclerotic heart disease of native coronary artery without angina pectoris: Secondary | ICD-10-CM | POA: Insufficient documentation

## 2023-03-19 DIAGNOSIS — I110 Hypertensive heart disease with heart failure: Secondary | ICD-10-CM | POA: Diagnosis not present

## 2023-03-19 DIAGNOSIS — Z79899 Other long term (current) drug therapy: Secondary | ICD-10-CM | POA: Insufficient documentation

## 2023-03-19 DIAGNOSIS — I10 Essential (primary) hypertension: Secondary | ICD-10-CM | POA: Diagnosis not present

## 2023-03-19 DIAGNOSIS — I252 Old myocardial infarction: Secondary | ICD-10-CM | POA: Diagnosis not present

## 2023-03-19 DIAGNOSIS — I428 Other cardiomyopathies: Secondary | ICD-10-CM | POA: Insufficient documentation

## 2023-03-19 DIAGNOSIS — I5032 Chronic diastolic (congestive) heart failure: Secondary | ICD-10-CM | POA: Diagnosis not present

## 2023-03-19 DIAGNOSIS — I4729 Other ventricular tachycardia: Secondary | ICD-10-CM

## 2023-03-19 DIAGNOSIS — Z8673 Personal history of transient ischemic attack (TIA), and cerebral infarction without residual deficits: Secondary | ICD-10-CM | POA: Diagnosis not present

## 2023-03-19 DIAGNOSIS — R9431 Abnormal electrocardiogram [ECG] [EKG]: Secondary | ICD-10-CM | POA: Insufficient documentation

## 2023-03-19 DIAGNOSIS — Z7984 Long term (current) use of oral hypoglycemic drugs: Secondary | ICD-10-CM | POA: Diagnosis not present

## 2023-03-19 DIAGNOSIS — I890 Lymphedema, not elsewhere classified: Secondary | ICD-10-CM | POA: Diagnosis not present

## 2023-03-19 MED ORDER — LOSARTAN POTASSIUM 25 MG PO TABS: 12.5000 mg | ORAL_TABLET | Freq: Every day | ORAL | 5 refills | Status: DC

## 2023-03-19 NOTE — Patient Instructions (Signed)
Begin losartan as 1/2 tablet every day

## 2023-03-19 NOTE — Progress Notes (Addendum)
PCP: Jerl Mina, MD (last seen 05/24 Primary Cardiologist: Lewayne Bunting, MD (last seen 02/23); needs f/u scheduled  HPI:  Yvonne Newton is a 81 y/o female with a history of HTN, stroke 2019, carotid artery disease, CAD, NSTEMI, DM, dementia, hyperlipidemia, VT, tobacco use and chronic heart failure.   Admitted 07/11/22 due to bilateral arm pain, upper back pain and chest pain starting while walking outside. She had associated diaphoresis, shortness of breath and nausea. Cardiology consult obtained. Cath done with nonobstructive CAD. IV lasix transitioned to oral diuretics. Flecainide stopped. Discharged after 4 days.   Echo 07/13/22: EF of 50-55%  LHC 07/14/22:   Mid RCA lesion is 30% stenosed.   1st Mrg lesion is 30% stenosed.   Prox LAD to Mid LAD lesion is 20% stenosed.   2nd Diag lesion is 30% stenosed.   Mid LAD lesion is 60% stenosed.   There is mild left ventricular systolic dysfunction.   LV end diastolic pressure is moderately elevated.   The left ventricular ejection fraction is 45-50% by visual estimate.  1.  Moderate one-vessel coronary arteries involving the mid LAD with 60% stenosis not significant by fractional flow reserve evaluation with an IFR of 0.91. 2.  Mildly reduced LV systolic function with hypokinesis of mid to distal left ventricular segments. 3.  Moderately elevated left ventricular end-diastolic pressure at 28 to 29 mmHg.  She presents with chief complaint of moderate SOB with little exertion. Chronic in nature. Has associated chest tightness, dizziness and occasional pedal edema along with this. Denies chest pain, cough, dizziness or difficulty sleeping. Says that she has trouble falling asleep and when she does, she sleeps for a few hours at a time.   Has been off of her plavix for this entire 2024 year her daughter thinks. Per cath note 11/23, she was supposed to be on it for 1 year. She also isn't taking her losartan but she's unsure of why. Seems in  looking through meds that when the refills run out is when she stops taking them.   ROS: All systems negative except as listed in HPI, PMH and Problem List.  SH:  Social History   Socioeconomic History   Marital status: Widowed    Spouse name: Not on file   Number of children: Not on file   Years of education: Not on file   Highest education level: Not on file  Occupational History   Not on file  Tobacco Use   Smoking status: Former    Current packs/day: 0.00    Types: Cigarettes    Quit date: 09/01/1998    Years since quitting: 24.5   Smokeless tobacco: Never  Vaping Use   Vaping status: Never Used  Substance and Sexual Activity   Alcohol use: No   Drug use: No   Sexual activity: Not on file  Other Topics Concern   Not on file  Social History Narrative   Not on file   Social Determinants of Health   Financial Resource Strain: Not on file  Food Insecurity: Not on file  Transportation Needs: Not on file  Physical Activity: Not on file  Stress: Not on file  Social Connections: Not on file  Intimate Partner Violence: Not on file    FH:  Family History  Problem Relation Age of Onset   Heart attack Father 43   Heart attack Brother 70   Heart attack Brother        had CABG   Breast cancer Neg Hx  Past Medical History:  Diagnosis Date   Arthritis 07/22/2017   Cerebellar stroke (HCC)    CHF (congestive heart failure) (HCC)    Diabetes mellitus type 2 in nonobese (HCC)    DM (diabetes mellitus) (HCC)    Dysarthria    Essential hypertension 07/22/2017   Hyperlipidemia, unspecified 07/22/2017   OA (osteoarthritis) of knee    right   Obesity    Stroke (HCC)    Tachycardia 07/22/2017   VENTRICULAR TACHYCARDIA 09/05/2009   Qualifier: Diagnosis of  By: Susette Racer CMA, Jewel     Ventricular tachycardia (HCC)    Vitamin B 12 deficiency     Current Outpatient Medications  Medication Sig Dispense Refill   aspirin EC 81 MG EC tablet Take 1 tablet (81 mg total) by  mouth daily.     carvedilol (COREG) 12.5 MG tablet TAKE 1 TAB BY MOUTH 2 TIMES DAILY. PATIENT NEEDS APPOINTMENT WITH PROVIDER FOR ANY FUTURE REFILLS. 30 tablet 0   CYANOCOBALAMIN IJ Inject as directed every 30 (thirty) days. On or about the 26th of each month     diltiazem (CARDIZEM CD) 120 MG 24 hr capsule TAKE 1 CAPSULE BY MOUTH EVERY DAY 15 capsule 0   furosemide (LASIX) 40 MG tablet TAKE 2 TABLETS BY MOUTH EVERY DAY 60 tablet 1   glipiZIDE (GLUCOTROL XL) 2.5 MG 24 hr tablet Take 2.5 mg by mouth daily with breakfast.     metFORMIN (GLUCOPHAGE-XR) 500 MG 24 hr tablet Take 1,000 mg by mouth 2 (two) times daily with a meal.     potassium chloride SA (KLOR-CON M20) 20 MEQ tablet TAKE 2 TABLETS BY MOUTH DAILY 60 tablet 1   rosuvastatin (CRESTOR) 5 MG tablet Take 1 tablet (5 mg total) by mouth daily at 6 PM. 90 tablet 3   cetirizine (ZYRTEC) 10 MG tablet Take 10 mg by mouth daily. (Patient not taking: Reported on 03/19/2023)  2   clopidogrel (PLAVIX) 75 MG tablet Take 1 tablet (75 mg total) by mouth daily with breakfast. (Patient not taking: Reported on 03/19/2023) 30 tablet 1   EPINEPHrine 0.3 mg/0.3 mL IJ SOAJ injection Inject 0.3 mg into the muscle once as needed (severe allergic reaction). (Patient not taking: Reported on 07/31/2022)     losartan (COZAAR) 25 MG tablet Take 1 tablet (25 mg total) by mouth daily. (Patient not taking: Reported on 03/19/2023) 30 tablet 1   memantine (NAMENDA TITRATION PACK) tablet pack Take 1 tablet by mouth in the morning and at bedtime. (Patient not taking: Reported on 03/19/2023)     pantoprazole (PROTONIX) 20 MG tablet TAKE 1 TABLET BY MOUTH EVERY DAY (Patient not taking: Reported on 03/19/2023) 30 tablet 1   No current facility-administered medications for this visit.    Vitals:   03/19/23 1052  BP: (!) 110/48  Pulse: 74  SpO2: 96%  Weight: 153 lb (69.4 kg)  Height: 5' (1.524 m)   Lab Results  Component Value Date   CREATININE 1.56 (H) 09/05/2022    CREATININE 1.73 (H) 08/29/2022   CREATININE 1.29 (H) 08/14/2022   PHYSICAL EXAM:  General:  Well appearing. No resp difficulty HEENT: normal Neck: supple. JVP flat. No lymphadenopathy or thryomegaly appreciated. Cor: PMI normal. Regular rate & rhythm. No rubs, gallops or murmurs. Lungs: clear Abdomen: soft, nontender, nondistended. No hepatosplenomegaly. No bruits or masses.  Extremities: no cyanosis, clubbing, rash, trace pedal edema bilateral lower legs Neuro: alert & oriented x3, cranial nerves grossly intact. Moves all 4 extremities w/o difficulty. Affect  pleasant.   ECG: NSR with HR 71, unchanged from previous; personally reviewed   ASSESSMENT & PLAN:  1: NICM with preserved ejection fraction- - thought to be stress induced - NYHA class III - weighing daily; reminded to call for an overnight weight gain of > 2 pounds or a weekly weight gain of > 5 pounds - weight down 14 pounds from last visit here 7 months ago - Echo 07/13/22: EF of 50-55% - daughter is reading food labels for sodium content and doesn't cook with salt - continue carvedilol 12.5mg  BID - continue furosemide 80mg  daily/ potassium daily - begin losartan 12.5mg  daily - BMP next visit - she says that she gets UTI's "easily" and "often" so may not be a good candidate for SGLT2 - BNP 07/11/22 was 119.9  2: HTN- - BP 110/48 - saw PCP Yvonne Newton) 05/24 - BMP 12/25/22 reviewed and showed sodium 141, potassium 4.0, creatinine 1.6 & GFR 32  3: Type 2 DM- - saw endocrinology Yvonne Newton) 04/24 - A1c 12/04/22 was 6.2%  4: History of VT- - implantable loop recorder removed 10/2021 - no more flecainide - continue diltiazem 120mg  daily - saw cardiology Yvonne Newton) 02/23  5: Lymphedema- - stage 2 - not wearing compression socks - says that she tried compression socks before but they hurt her legs - encouraged to go to [facility] store and get measured for compression socks - elevate legs when  sitting for long periods of time - limited in her ability to exercise due to symptoms  6: CAD- - EKG today: NSR w/ HR 71 - messaged cardiology about her plavix and Dr Yvonne Newton said there was no reason to resume it at this time - [facility] 07/14/22:   Mid RCA lesion is 30% stenosed.   1st Mrg lesion is 30% stenosed.   Prox LAD to Mid LAD lesion is 20% stenosed.   2nd Diag lesion is 30% stenosed.   Mid LAD lesion is 60% stenosed.   There is mild left ventricular systolic dysfunction.   LV end diastolic pressure is moderately elevated.   The left ventricular ejection fraction is 45-50% by visual estimate.  1.  Moderate one-vessel coronary arteries involving the mid LAD with 60% stenosis not significant by fractional flow reserve evaluation with an IFR of 0.91. 2.  Mildly reduced LV systolic function with hypokinesis of mid to distal left ventricular segments. 3.  Moderately elevated left ventricular end-diastolic pressure at 28 to 29 mmHg.   Return in 3-4 weeks, sooner if needed

## 2023-03-20 ENCOUNTER — Telehealth: Payer: Self-pay

## 2023-03-20 NOTE — Progress Notes (Signed)
Pt called stating she thinks her BPs are running low. Reported a reading of 115/65/HR of 64. Notified pt that those are normal BP and HR readings. Pt also states her chest feels funny. Asked pt to describe the feeling. Pt states her heartbeat just doesn't feel rt.   Spoke with Clarisa Kindred, FNP. BPs and HR are normal. If pt begins to have CP, pt should go to ED immediately.  Called pt and let pt know of [employer]. Pt stated she just didn't feel good, her mind is out of wack and her and she was on too many meds and had trouble breathing that was slowing her heartbeat down.  Spoke w/ Inetta Fermo. Called pt back, pt states she is now feeling better and has no concerns and that she was outside in the heat and she thinks that was what was making her not feel too good. Pt states her breathing is back to normal now that she is out of the sun.

## 2023-04-01 ENCOUNTER — Telehealth: Payer: Self-pay

## 2023-04-01 DIAGNOSIS — M79602 Pain in left arm: Secondary | ICD-10-CM | POA: Diagnosis not present

## 2023-04-01 DIAGNOSIS — F03A0 Unspecified dementia, mild, without behavioral disturbance, psychotic disturbance, mood disturbance, and anxiety: Secondary | ICD-10-CM | POA: Diagnosis not present

## 2023-04-01 DIAGNOSIS — I10 Essential (primary) hypertension: Secondary | ICD-10-CM | POA: Diagnosis not present

## 2023-04-01 DIAGNOSIS — M79601 Pain in right arm: Secondary | ICD-10-CM | POA: Diagnosis not present

## 2023-04-01 DIAGNOSIS — R079 Chest pain, unspecified: Secondary | ICD-10-CM | POA: Diagnosis not present

## 2023-04-01 DIAGNOSIS — N184 Chronic kidney disease, stage 4 (severe): Secondary | ICD-10-CM | POA: Diagnosis not present

## 2023-04-01 DIAGNOSIS — I25119 Atherosclerotic heart disease of native coronary artery with unspecified angina pectoris: Secondary | ICD-10-CM | POA: Diagnosis not present

## 2023-04-01 DIAGNOSIS — R252 Cramp and spasm: Secondary | ICD-10-CM | POA: Diagnosis not present

## 2023-04-01 DIAGNOSIS — I779 Disorder of arteries and arterioles, unspecified: Secondary | ICD-10-CM | POA: Diagnosis not present

## 2023-04-01 NOTE — Telephone Encounter (Signed)
[facility] clinic RN called stating that pt's son wanted a follow up appointment earlier than 8/12 with [facility].  Appt available on 8/7 and given to pt.    [facility] clinic RN also stated that pt has been having intermittent chest pain for about a week. And that per her Gavin Potters MD, she was not going to send patient to ED and that they were going to get lab work completed in their clinic. Also pt's son Saby Sima was agreeable to not go to ED.  This RN advised that if pt's chest pain does not go away and chest pressure increases to go to the ED.  [facility] clinic RN  aware, agreeable, and verbalized understanding. Cardell Peach aware, agreeable, and verbalized understanding.

## 2023-04-07 ENCOUNTER — Other Ambulatory Visit: Payer: Self-pay | Admitting: Internal Medicine

## 2023-04-07 DIAGNOSIS — I10 Essential (primary) hypertension: Secondary | ICD-10-CM

## 2023-04-07 NOTE — Progress Notes (Unsigned)
PCP: Jerl Mina, MD (last seen 07/24 Primary Cardiologist: Yvonne Bunting, MD (last seen 02/23); needs f/u scheduled  HPI:  Yvonne Newton is a 82 y/o female with a history of HTN, stroke 2019, carotid artery disease, CAD, NSTEMI, DM, dementia, hyperlipidemia, VT, tobacco use and chronic heart failure.   Admitted 07/11/22 due to bilateral arm pain, upper back pain and chest pain starting while walking outside. She had associated diaphoresis, shortness of breath and nausea. Cardiology consult obtained. Cath done with nonobstructive CAD. IV lasix transitioned to oral diuretics. Flecainide stopped. Discharged after 4 days.   Echo 07/13/22: EF of 50-55%  LHC 07/14/22:   Mid RCA lesion is 30% stenosed.   1st Mrg lesion is 30% stenosed.   Prox LAD to Mid LAD lesion is 20% stenosed.   2nd Diag lesion is 30% stenosed.   Mid LAD lesion is 60% stenosed.   There is mild left ventricular systolic dysfunction.   LV end diastolic pressure is moderately elevated.   The left ventricular ejection fraction is 45-50% by visual estimate.  1.  Moderate one-vessel coronary arteries involving the mid LAD with 60% stenosis not significant by fractional flow reserve evaluation with an IFR of 0.91. 2.  Mildly reduced LV systolic function with hypokinesis of mid to distal left ventricular segments. 3.  Moderately elevated left ventricular end-diastolic pressure at 28 to 29 mmHg.  She presents with chief complaint of minimal shortness of breath with moderate exertion. Chronic in nature. Has associated moderate fatigue, generalized weakness, pedal edema and chest pressure along with this. Also endorses loud belching often because it relieves her chest pressure. Her biggest complaint is of her chest pressure that she's had for several weeks with intermittent radiation down both arms and into her lower back. Nothing makes it any better or worse. She denies palpitations, dizziness, nausea, sweating, abdominal distention,  esophageal burning or difficulty sleeping.   She has not gotten her compression socks yet. Patient has been eating dill pickle chips lately and has been "blowing the salt off".  Has been out of her dilitazem for most of this year (per daughter) and it just got refilled by cardiology yesterday and she's going to pick it up today and resume taking it. Medication consistency and compliance seem to be a chronic issue.   Daughter works evening shift and just didn't get around to making a follow-up appt with the cardiologist Dr. [name].   ROS: All systems negative except as listed in HPI, PMH and Problem List.  SH:  Social History   Socioeconomic History   Marital status: Widowed    Spouse name: Not on file   Number of children: Not on file   Years of education: Not on file   Highest education level: Not on file  Occupational History   Not on file  Tobacco Use   Smoking status: Former    Current packs/day: 0.00    Types: Cigarettes    Quit date: 09/01/1998    Years since quitting: 24.6   Smokeless tobacco: Never  Vaping Use   Vaping status: Never Used  Substance and Sexual Activity   Alcohol use: No   Drug use: No   Sexual activity: Not on file  Other Topics Concern   Not on file  Social History Narrative   Not on file   Social Determinants of Health   Financial Resource Strain: Not on file  Food Insecurity: Not on file  Transportation Needs: Not on file  Physical Activity: Not on  file  Stress: Not on file  Social Connections: Not on file  Intimate Partner Violence: Not on file    FH:  Family History  Problem Relation Age of Onset   Heart attack Father 64   Heart attack Brother 22   Heart attack Brother        had CABG   Breast cancer Neg Hx     Past Medical History:  Diagnosis Date   Arthritis 07/22/2017   Cerebellar stroke (HCC)    CHF (congestive heart failure) (HCC)    Diabetes mellitus type 2 in nonobese (HCC)    DM (diabetes mellitus) (HCC)    Dysarthria     Essential hypertension 07/22/2017   Hyperlipidemia, unspecified 07/22/2017   OA (osteoarthritis) of knee    right   Obesity    Stroke (HCC)    Tachycardia 07/22/2017   VENTRICULAR TACHYCARDIA 09/05/2009   Qualifier: Diagnosis of  By: Susette Racer CMA, Jewel     Ventricular tachycardia (HCC)    Vitamin B 12 deficiency     Current Outpatient Medications  Medication Sig Dispense Refill   aspirin EC 81 MG EC tablet Take 1 tablet (81 mg total) by mouth daily.     carvedilol (COREG) 12.5 MG tablet TAKE 1 TAB BY MOUTH 2 TIMES DAILY. PATIENT NEEDS APPOINTMENT WITH PROVIDER FOR ANY FUTURE REFILLS. 30 tablet 0   clopidogrel (PLAVIX) 75 MG tablet Take 1 tablet (75 mg total) by mouth daily with breakfast. (Patient not taking: Reported on 03/19/2023) 30 tablet 1   CYANOCOBALAMIN IJ Inject as directed every 30 (thirty) days. On or about the 26th of each month     diltiazem (CARDIZEM CD) 120 MG 24 hr capsule TAKE 1 CAPSULE BY MOUTH EVERY DAY 15 capsule 0   furosemide (LASIX) 40 MG tablet TAKE 2 TABLETS BY MOUTH EVERY DAY 60 tablet 1   glipiZIDE (GLUCOTROL XL) 2.5 MG 24 hr tablet Take 2.5 mg by mouth daily with breakfast.     losartan (COZAAR) 25 MG tablet Take 0.5 tablets (12.5 mg total) by mouth daily. 15 tablet 5   memantine (NAMENDA TITRATION PACK) tablet pack Take 1 tablet by mouth in the morning and at bedtime. (Patient not taking: Reported on 03/19/2023)     metFORMIN (GLUCOPHAGE-XR) 500 MG 24 hr tablet Take 1,000 mg by mouth 2 (two) times daily with a meal.     potassium chloride SA (KLOR-CON M20) 20 MEQ tablet TAKE 2 TABLETS BY MOUTH DAILY 60 tablet 1   rosuvastatin (CRESTOR) 5 MG tablet Take 1 tablet (5 mg total) by mouth daily at 6 PM. 90 tablet 3   No current facility-administered medications for this visit.   Vitals:   04/08/23 1307  BP: (!) 123/55  Pulse: 73  SpO2: 95%  Weight: 155 lb (70.3 kg)   Wt Readings from Last 3 Encounters:  04/08/23 155 lb (70.3 kg)  03/19/23 153 lb (69.4 kg)   08/14/22 167 lb (75.8 kg)   Lab Results  Component Value Date   CREATININE 1.56 (H) 09/05/2022   CREATININE 1.73 (H) 08/29/2022   CREATININE 1.29 (H) 08/14/2022   PHYSICAL EXAM:  General:  Well appearing. No resp difficulty HEENT: normal Neck: supple. JVP flat. No lymphadenopathy or thryomegaly appreciated. Cor: PMI normal. Regular rate & rhythm. No rubs, gallops or murmurs. Lungs: clear Abdomen: soft, nontender, nondistended. No hepatosplenomegaly. No bruits or masses.  Extremities: no cyanosis, clubbing, rash, 1+ pedal edema bilateral lower legs Neuro: alert & oriented x3, cranial nerves  grossly intact. Moves all 4 extremities w/o difficulty. Affect pleasant.   ECG: today is NSR with HR 73   ASSESSMENT & PLAN:  1: NICM with preserved ejection fraction- - thought to be stress induced - NYHA class III - weighing daily; reminded to call for an overnight weight gain of > 2 pounds or a weekly weight gain of > 5 pounds - weight up 2 pounds from last visit here 3 weeks ago - Echo 07/13/22: EF of 50-55% - daughter is reading food labels for sodium content and doesn't cook with salt; patient has been eating dill pickle chips and notes that her pedal edema has worsened since she's been eating these; reviewed the sodium content of these chips - continue carvedilol 12.5mg  BID - continue furosemide 80mg  daily/ potassium daily - continue losartan 12.5mg  daily - she says that she gets UTI's "easily" and "often" so may not be a good candidate for SGLT2 - BNP 07/11/22 was 119.9  2: HTN- - BP 123/55 - saw PCP Yvonne Newton) 07/24 - BMP 04/01/23 reviewed and showed sodium 140, potassium 4.1, creatinine 1.8 & GFR 28  3: Type 2 DM- - saw endocrinology Yvonne Newton) 04/24 - A1c 12/04/22 was 6.2%  4: History of VT- - implantable loop recorder removed 10/2021 - no more flecainide - resuming diltiazem 120mg  daily as they are going to pick this med up today; daughter says that patient has  been out of this med for probably all of this year - saw cardiology Yvonne Newton) 02/23; emphasized to daughter that she call his office to get a f/u appt scheduled  5: Lymphedema- - stage 2 - not wearing compression socks - says that she tried compression socks before but they hurt her legs - has not gotten compression socks yet but does still have the RX from last visit - advised patient to not eat anymore dill pickle chips - elevate legs when sitting for long periods of time - limited in her ability to exercise due to symptoms  6: CAD- - EKG today shows NSR with HR 73 - messaged cardiology Yvonne Newton) about patient's continued chest pressure - was considering adding isosorbide to see if that helped but with her resuming dilltiazem, will hold starting isosorbide in case her BP would get too low - was cathed just last year - [facility] 07/14/22:   Mid RCA lesion is 30% stenosed.   1st Mrg lesion is 30% stenosed.   Prox LAD to Mid LAD lesion is 20% stenosed.   2nd Diag lesion is 30% stenosed.   Mid LAD lesion is 60% stenosed.   There is mild left ventricular systolic dysfunction.   LV end diastolic pressure is moderately elevated.   The left ventricular ejection fraction is 45-50% by visual estimate.  1.  Moderate one-vessel coronary arteries involving the mid LAD with 60% stenosis not significant by fractional flow reserve evaluation with an IFR of 0.91. 2.  Mildly reduced LV systolic function with hypokinesis of mid to distal left ventricular segments. 3.  Moderately elevated left ventricular end-diastolic pressure at 28 to 29 mmHg.  Still unclear if med list is correct as meds weren't brought and there is hesitancy when answering about the meds  Return in 1 month, sooner if needed.

## 2023-04-08 ENCOUNTER — Ambulatory Visit: Payer: PPO | Attending: Family | Admitting: Family

## 2023-04-08 ENCOUNTER — Encounter: Payer: Self-pay | Admitting: Family

## 2023-04-08 VITALS — BP 123/55 | HR 73 | Wt 155.0 lb

## 2023-04-08 DIAGNOSIS — Z8673 Personal history of transient ischemic attack (TIA), and cerebral infarction without residual deficits: Secondary | ICD-10-CM | POA: Diagnosis not present

## 2023-04-08 DIAGNOSIS — I252 Old myocardial infarction: Secondary | ICD-10-CM | POA: Insufficient documentation

## 2023-04-08 DIAGNOSIS — I10 Essential (primary) hypertension: Secondary | ICD-10-CM

## 2023-04-08 DIAGNOSIS — E785 Hyperlipidemia, unspecified: Secondary | ICD-10-CM | POA: Diagnosis not present

## 2023-04-08 DIAGNOSIS — I5032 Chronic diastolic (congestive) heart failure: Secondary | ICD-10-CM

## 2023-04-08 DIAGNOSIS — Z87891 Personal history of nicotine dependence: Secondary | ICD-10-CM | POA: Insufficient documentation

## 2023-04-08 DIAGNOSIS — I25118 Atherosclerotic heart disease of native coronary artery with other forms of angina pectoris: Secondary | ICD-10-CM

## 2023-04-08 DIAGNOSIS — I110 Hypertensive heart disease with heart failure: Secondary | ICD-10-CM | POA: Insufficient documentation

## 2023-04-08 DIAGNOSIS — R0789 Other chest pain: Secondary | ICD-10-CM | POA: Diagnosis not present

## 2023-04-08 DIAGNOSIS — I890 Lymphedema, not elsewhere classified: Secondary | ICD-10-CM | POA: Insufficient documentation

## 2023-04-08 DIAGNOSIS — R0602 Shortness of breath: Secondary | ICD-10-CM | POA: Insufficient documentation

## 2023-04-08 DIAGNOSIS — E119 Type 2 diabetes mellitus without complications: Secondary | ICD-10-CM | POA: Diagnosis not present

## 2023-04-08 DIAGNOSIS — Z7984 Long term (current) use of oral hypoglycemic drugs: Secondary | ICD-10-CM | POA: Diagnosis not present

## 2023-04-08 DIAGNOSIS — Z79899 Other long term (current) drug therapy: Secondary | ICD-10-CM | POA: Insufficient documentation

## 2023-04-08 DIAGNOSIS — F0390 Unspecified dementia without behavioral disturbance: Secondary | ICD-10-CM | POA: Diagnosis not present

## 2023-04-08 DIAGNOSIS — I428 Other cardiomyopathies: Secondary | ICD-10-CM | POA: Diagnosis not present

## 2023-04-08 DIAGNOSIS — I4729 Other ventricular tachycardia: Secondary | ICD-10-CM

## 2023-04-09 ENCOUNTER — Encounter: Payer: Self-pay | Admitting: Family

## 2023-04-10 ENCOUNTER — Other Ambulatory Visit: Payer: Self-pay | Admitting: Family

## 2023-04-13 ENCOUNTER — Encounter: Payer: PPO | Admitting: Family

## 2023-05-11 ENCOUNTER — Telehealth: Payer: Self-pay | Admitting: Family

## 2023-05-11 ENCOUNTER — Encounter: Payer: PPO | Admitting: Family

## 2023-05-11 NOTE — Progress Notes (Deleted)
PCP: Jerl Mina, MD (last seen 07/24 Primary Cardiologist: Lewayne Bunting, MD (last seen 02/23); needs f/u scheduled  HPI:  Ms Yvonne Newton is a 82 y/o female with a history of HTN, stroke 2019, carotid artery disease, CAD, NSTEMI, DM, dementia, hyperlipidemia, VT, tobacco use and chronic heart failure.   Admitted 07/11/22 due to bilateral arm pain, upper back pain and chest pain starting while walking outside. She had associated diaphoresis, shortness of breath and nausea. Cardiology consult obtained. Cath done with nonobstructive CAD. IV lasix transitioned to oral diuretics. Flecainide stopped. Discharged after 4 days.   Echo 07/13/22: EF of 50-55%  LHC 07/14/22:   Mid RCA lesion is 30% stenosed.   1st Mrg lesion is 30% stenosed.   Prox LAD to Mid LAD lesion is 20% stenosed.   2nd Diag lesion is 30% stenosed.   Mid LAD lesion is 60% stenosed.   There is mild left ventricular systolic dysfunction.   LV end diastolic pressure is moderately elevated.   The left ventricular ejection fraction is 45-50% by visual estimate.  1.  Moderate one-vessel coronary arteries involving the mid LAD with 60% stenosis not significant by fractional flow reserve evaluation with an IFR of 0.91. 2.  Mildly reduced LV systolic function with hypokinesis of mid to distal left ventricular segments. 3.  Moderately elevated left ventricular end-diastolic pressure at 28 to 29 mmHg.  She presents with chief complaint of minimal shortness of breath with moderate exertion. Chronic in nature. Has associated moderate fatigue, generalized weakness, pedal edema and chest pressure along with this. Also endorses loud belching often because it relieves her chest pressure. Her biggest complaint is of her chest pressure that she's had for several weeks with intermittent radiation down both arms and into her lower back. Nothing makes it any better or worse. She denies palpitations, dizziness, nausea, sweating, abdominal distention,  esophageal burning or difficulty sleeping.   She has not gotten her compression socks yet. Patient has been eating dill pickle chips lately and has been "blowing the salt off".  Has been out of her dilitazem for most of this year (per daughter) and it just got refilled by cardiology yesterday and she's going to pick it up today and resume taking it. Medication consistency and compliance seem to be a chronic issue.   Daughter works evening shift and just didn't get around to making a follow-up appt with the cardiologist Dr. Ladona Ridgel.   ROS: All systems negative except as listed in HPI, PMH and Problem List.  SH:  Social History   Socioeconomic History   Marital status: Widowed    Spouse name: Not on file   Number of children: Not on file   Years of education: Not on file   Highest education level: Not on file  Occupational History   Not on file  Tobacco Use   Smoking status: Former    Current packs/day: 0.00    Types: Cigarettes    Quit date: 09/01/1998    Years since quitting: 24.7   Smokeless tobacco: Never  Vaping Use   Vaping status: Never Used  Substance and Sexual Activity   Alcohol use: No   Drug use: No   Sexual activity: Not on file  Other Topics Concern   Not on file  Social History Narrative   Not on file   Social Determinants of Health   Financial Resource Strain: Not on file  Food Insecurity: Not on file  Transportation Needs: Not on file  Physical Activity: Not on  file  Stress: Not on file  Social Connections: Not on file  Intimate Partner Violence: Not on file    FH:  Family History  Problem Relation Age of Onset   Heart attack Father 67   Heart attack Brother 32   Heart attack Brother        had CABG   Breast cancer Neg Hx     Past Medical History:  Diagnosis Date   Arthritis 07/22/2017   Cerebellar stroke (HCC)    CHF (congestive heart failure) (HCC)    Diabetes mellitus type 2 in nonobese (HCC)    DM (diabetes mellitus) (HCC)    Dysarthria     Essential hypertension 07/22/2017   Hyperlipidemia, unspecified 07/22/2017   OA (osteoarthritis) of knee    right   Obesity    Stroke (HCC)    Tachycardia 07/22/2017   VENTRICULAR TACHYCARDIA 09/05/2009   Qualifier: Diagnosis of  By: Susette Racer CMA, Jewel     Ventricular tachycardia (HCC)    Vitamin B 12 deficiency     Current Outpatient Medications  Medication Sig Dispense Refill   aspirin EC 81 MG EC tablet Take 1 tablet (81 mg total) by mouth daily.     carvedilol (COREG) 12.5 MG tablet TAKE 1 TAB BY MOUTH 2 TIMES DAILY. PATIENT NEEDS APPOINTMENT WITH PROVIDER FOR ANY FUTURE REFILLS. 90 tablet 1   CYANOCOBALAMIN IJ Inject as directed every 30 (thirty) days. On or about the 26th of each month     diltiazem (CARDIZEM CD) 120 MG 24 hr capsule TAKE 1 CAPSULE BY MOUTH EVERY DAY (Patient not taking: Reported on 04/08/2023) 90 capsule 1   furosemide (LASIX) 40 MG tablet TAKE 2 TABLETS BY MOUTH EVERY DAY 180 tablet 1   glipiZIDE (GLUCOTROL XL) 2.5 MG 24 hr tablet Take 2.5 mg by mouth daily with breakfast.     KLOR-CON M20 20 MEQ tablet TAKE 2 TABLETS BY MOUTH EVERY DAY 180 tablet 1   losartan (COZAAR) 25 MG tablet Take 0.5 tablets (12.5 mg total) by mouth daily. 15 tablet 5   memantine (NAMENDA TITRATION PACK) tablet pack Take 1 tablet by mouth in the morning and at bedtime.     metFORMIN (GLUCOPHAGE-XR) 500 MG 24 hr tablet Take 1,000 mg by mouth 2 (two) times daily with a meal.     rosuvastatin (CRESTOR) 5 MG tablet Take 1 tablet (5 mg total) by mouth daily at 6 PM. 90 tablet 3   No current facility-administered medications for this visit.   There were no vitals filed for this visit.  Wt Readings from Last 3 Encounters:  04/08/23 155 lb (70.3 kg)  03/19/23 153 lb (69.4 kg)  08/14/22 167 lb (75.8 kg)   Lab Results  Component Value Date   CREATININE 1.56 (H) 09/05/2022   CREATININE 1.73 (H) 08/29/2022   CREATININE 1.29 (H) 08/14/2022   PHYSICAL EXAM:  General:  Well appearing. No  resp difficulty HEENT: normal Neck: supple. JVP flat. No lymphadenopathy or thryomegaly appreciated. Cor: PMI normal. Regular rate & rhythm. No rubs, gallops or murmurs. Lungs: clear Abdomen: soft, nontender, nondistended. No hepatosplenomegaly. No bruits or masses.  Extremities: no cyanosis, clubbing, rash, 1+ pedal edema bilateral lower legs Neuro: alert & oriented x3, cranial nerves grossly intact. Moves all 4 extremities w/o difficulty. Affect pleasant.   ECG: today is NSR with HR 73   ASSESSMENT & PLAN:  1: NICM with preserved ejection fraction- - thought to be stress induced - NYHA class III - weighing  daily; reminded to call for an overnight weight gain of > 2 pounds or a weekly weight gain of > 5 pounds - weight up 2 pounds from last visit here 3 weeks ago - Echo 07/13/22: EF of 50-55% - daughter is reading food labels for sodium content and doesn't cook with salt; patient has been eating dill pickle chips and notes that her pedal edema has worsened since she's been eating these; reviewed the sodium content of these chips - continue carvedilol 12.5mg  BID - continue furosemide 80mg  daily/ potassium daily - continue losartan 12.5mg  daily - she says that she gets UTI's "easily" and "often" so may not be a good candidate for SGLT2 - BNP 07/11/22 was 119.9  2: HTN- - BP 123/55 - saw PCP Burnett Sheng) 07/24 - BMP 04/01/23 reviewed and showed sodium 140, potassium 4.1, creatinine 1.8 & GFR 28  3: Type 2 DM- - saw endocrinology Gershon Crane) 04/24 - A1c 12/04/22 was 6.2%  4: History of VT- - implantable loop recorder removed 10/2021 - no more flecainide - resuming diltiazem 120mg  daily as they are going to pick this med up today; daughter says that patient has been out of this med for probably all of this year - saw cardiology Ladona Ridgel) 02/23; emphasized to daughter that she call his office to get a f/u appt scheduled  5: Lymphedema- - stage 2 - not wearing compression  socks - says that she tried compression socks before but they hurt her legs - has not gotten compression socks yet but does still have the RX from last visit - advised patient to not eat anymore dill pickle chips - elevate legs when sitting for long periods of time - limited in her ability to exercise due to symptoms  6: CAD- - EKG today shows NSR with HR 73 - messaged cardiology Ladona Ridgel) about patient's continued chest pressure - was considering adding isosorbide to see if that helped but with her resuming dilltiazem, will hold starting isosorbide in case her BP would get too low - was cathed just last year - LHC 07/14/22:   Mid RCA lesion is 30% stenosed.   1st Mrg lesion is 30% stenosed.   Prox LAD to Mid LAD lesion is 20% stenosed.   2nd Diag lesion is 30% stenosed.   Mid LAD lesion is 60% stenosed.   There is mild left ventricular systolic dysfunction.   LV end diastolic pressure is moderately elevated.   The left ventricular ejection fraction is 45-50% by visual estimate.  1.  Moderate one-vessel coronary arteries involving the mid LAD with 60% stenosis not significant by fractional flow reserve evaluation with an IFR of 0.91. 2.  Mildly reduced LV systolic function with hypokinesis of mid to distal left ventricular segments. 3.  Moderately elevated left ventricular end-diastolic pressure at 28 to 29 mmHg.  Still unclear if med list is correct as meds weren't brought and there is hesitancy when answering about the meds  Return in 1 month, sooner if needed.

## 2023-05-11 NOTE — Telephone Encounter (Signed)
Patient did not show for her [facility] appointment on 05/11/23.

## 2023-05-16 ENCOUNTER — Other Ambulatory Visit: Payer: Self-pay | Admitting: Family

## 2023-05-19 ENCOUNTER — Other Ambulatory Visit: Payer: Self-pay | Admitting: Family

## 2023-05-26 ENCOUNTER — Other Ambulatory Visit: Payer: Self-pay | Admitting: Family

## 2023-06-16 ENCOUNTER — Emergency Department
Admission: EM | Admit: 2023-06-16 | Discharge: 2023-06-16 | Disposition: A | Discharge status: Home / Self Care | Payer: PPO | Attending: Emergency Medicine | Admitting: Emergency Medicine

## 2023-06-16 ENCOUNTER — Emergency Department: Payer: PPO

## 2023-06-16 ENCOUNTER — Other Ambulatory Visit: Payer: Self-pay

## 2023-06-16 DIAGNOSIS — I10 Essential (primary) hypertension: Secondary | ICD-10-CM | POA: Diagnosis not present

## 2023-06-16 DIAGNOSIS — S8002XA Contusion of left knee, initial encounter: Secondary | ICD-10-CM | POA: Diagnosis not present

## 2023-06-16 DIAGNOSIS — F0390 Unspecified dementia without behavioral disturbance: Secondary | ICD-10-CM | POA: Insufficient documentation

## 2023-06-16 DIAGNOSIS — W19XXXA Unspecified fall, initial encounter: Secondary | ICD-10-CM | POA: Diagnosis not present

## 2023-06-16 DIAGNOSIS — M25562 Pain in left knee: Secondary | ICD-10-CM | POA: Diagnosis not present

## 2023-06-16 DIAGNOSIS — Y92007 Garden or yard of unspecified non-institutional (private) residence as the place of occurrence of the external cause: Secondary | ICD-10-CM | POA: Diagnosis not present

## 2023-06-16 DIAGNOSIS — S8992XA Unspecified injury of left lower leg, initial encounter: Secondary | ICD-10-CM | POA: Diagnosis present

## 2023-06-16 DIAGNOSIS — I509 Heart failure, unspecified: Secondary | ICD-10-CM | POA: Insufficient documentation

## 2023-06-16 DIAGNOSIS — M25462 Effusion, left knee: Secondary | ICD-10-CM

## 2023-06-16 DIAGNOSIS — I2510 Atherosclerotic heart disease of native coronary artery without angina pectoris: Secondary | ICD-10-CM | POA: Diagnosis not present

## 2023-06-16 DIAGNOSIS — S80919A Unspecified superficial injury of unspecified knee, initial encounter: Secondary | ICD-10-CM | POA: Diagnosis not present

## 2023-06-16 MED ORDER — TRAMADOL HCL 50 MG PO TABS
50.0000 mg | ORAL_TABLET | Freq: Three times a day (TID) | ORAL | 0 refills | Status: DC | PRN
Start: 2023-06-16 — End: 2024-05-27

## 2023-06-16 MED ORDER — TRAMADOL HCL 50 MG PO TABS
50.0000 mg | ORAL_TABLET | ORAL | Status: AC
  Administered 2023-06-16: 50 mg via ORAL
  Filled 2023-06-16: qty 1

## 2023-06-16 MED ORDER — ACETAMINOPHEN 500 MG PO TABS
1000.0000 mg | ORAL_TABLET | ORAL | Status: AC
  Administered 2023-06-16: 1000 mg via ORAL
  Filled 2023-06-16: qty 2

## 2023-06-16 NOTE — ED Notes (Signed)
Pt expressed the need to pee again. Pt asking to get up and go into bathroom. Pts son had lowered bed side rail and was encouraging pt to get up and void in the bathroom. C-collar removed by pt completely at this time and seen laying on raised bed rail. EDT reiterated the concerns for her safely as stated earlier. EDT offered to place pt on bedpan again. Pt agreed. Pt rolled into ED Triage 3 and assisted onto bedpan. EDT stayed in room with pt until pt expressed that she was finished voiding. Bed pan removed and pt assisted to get redressed. EDT slid pt up in bed once more, positioned pillow beneath pts knee, provided warm blankets for pt, raised both side rails, and placed bed in triage middle with her son at bedside. Stretcher locked in lowest position.

## 2023-06-16 NOTE — ED Notes (Signed)
Pt in scans.

## 2023-06-16 NOTE — ED Notes (Signed)
Patient transported to CT 

## 2023-06-16 NOTE — ED Provider Notes (Signed)
[facility] Provider Note    Event Date/Time   First MD Initiated Contact with Patient 06/16/23 1857     (approximate)   History   Fall (Pt fell while cleaning her yard, L. Knee swollen compared to R. knee)   HPI  Yvonne Newton is a 82 y.o. female history of CHF, dementia, ventricular tachycardia, stroke, coronary disease   Today she was in her yard, she was out picking things up when she stumbled forward landing on her left knee.  She reports she landed fairly hard right on the left knee.  It has been painful and swollen.  She does report as well as her son is at the bedside, that she is not supposed to but she frequently goes out walks to the grass in the yard and will go pick things up to sort of clean the yard up.  She denies hitting her head.  No neck pain.  No other injury but reports quite sore and her left knee is swollen.  No numbness tingling or weakness.  No other injuries.  No injury to the right leg or arms.  No chest injury.  Denies pain in the left hip left ankle or foot area.  All the pain is across the front of her left knee over the kneecap area that is now swollen  Physical Exam   Triage Vital Signs: ED Triage Vitals  Encounter Vitals Group     BP 06/16/23 1723 126/78     Systolic BP Percentile --      Diastolic BP Percentile --      Pulse Rate 06/16/23 1723 88     Resp 06/16/23 1723 14     Temp 06/16/23 1723 (!) 97.5 F (36.4 C)     Temp Source 06/16/23 1723 Oral     SpO2 06/16/23 1723 94 %     Weight 06/16/23 1723 156 lb (70.8 kg)     Height 06/16/23 1723 5' (1.524 m)     Head Circumference --      Peak Flow --      Pain Score 06/16/23 1721 5     Pain Loc --      Pain Education --      Exclude from Growth Chart --     Most recent vital signs: Vitals:   06/16/23 1723 06/16/23 1942  BP: 126/78 (!) 155/54  Pulse: 88 93  Resp: 14 16  Temp: (!) 97.5 F (36.4 C) 98.1 F (36.7 C)  SpO2: 94% 96%      General: Awake, no distress.  CV:  Good peripheral perfusion.  Strong palpable pulse left lower extremity dorsalis pedis Resp:  Normal effort.  Clear bilateral Abd:  No distention.  Soft nontender nondistended Other:  Normocephalic atraumatic.  No cervical tenderness.  RIGHT Right upper extremity demonstrates normal strength, good use of all muscles. No edema bruising or contusions of the right shoulder/upper arm, right elbow, right forearm / hand. Full range of motion of the right right upper extremity without pain. No evidence of trauma. Strong radial pulse. Intact median/ulnar/radial neuro-muscular exam.  LEFT Left upper extremity demonstrates normal strength, good use of all muscles. No edema bruising or contusions of the left shoulder/upper arm, left elbow, left forearm / hand. Full range of motion of the left  upper extremity without pain. No evidence of trauma. Strong radial pulse. Intact median/ulnar/radial neuro-muscular exam.  Lower Extremities  No edema. Normal DP/PT pulses bilateral with good cap refill.  Normal neuro-motor function lower extremities bilateral.  RIGHT Right lower extremity demonstrates normal strength, good use of all muscles. No edema bruising or contusions of the right hip, right knee, right ankle. Full range of motion of the right lower extremity without pain. No pain on axial loading. No evidence of trauma.  LEFT Left lower extremity demonstrates normal strength, good use of all muscles but limitation with flexion extension at the knee due to pain. No edema bruising or contusions of the hip, foot, ankle.  There is mild edema overlying the anterior surface of the knee including the prepatellar region.  There is no open injury or laceration.  There is no bruising.   No pain on axial loading. No evidence of trauma except for tenderness and edema overlying the left anterior knee joint.    ED Results / Procedures / Treatments   Labs (all labs ordered  are listed, but only abnormal results are displayed) Labs Reviewed - No data to display    EKG     RADIOLOGY  CT head interpreted by me as grossly negative for acute major trauma  Imaging of the left knee interpreted by me as negative for acute fracture.   PROCEDURES:  Critical Care performed: No  Procedures   MEDICATIONS ORDERED IN ED: Medications  acetaminophen (TYLENOL) tablet 1,000 mg (1,000 mg Oral Given 06/16/23 1939)  traMADol (ULTRAM) tablet 50 mg (50 mg Oral Given 06/16/23 1939)     IMPRESSION / MDM / ASSESSMENT AND PLAN / ED COURSE  I reviewed the triage vital signs and the nursing notes.                              Differential diagnosis includes, but is not limited to, contusion, internal derangement of the knee, hemarthrosis, knee Injury, fracture.  On my review of her x-ray I do not interpreted an obvious acute fracture of the left knee  CT imaging of the cervical spine and head interpreted by me as grossly negative for acute pathology.  She is awake alert neurovascular intact.  Pain limited to the left anterior knee joint.  There is swelling and certainly contusion, but cannot entirely exclude underlying derangement, though no obvious fracture noted on imaging.  Await final read by radiologist   Discussed with patient and son including, or concerning side effects and potential pain medication such as tramadol but given her degree of discomfort we will trial a dose of tramadol here.  She advises that her daughter helps her with medication dosing and safety.  Patient's presentation is most consistent with acute complicated illness / injury requiring diagnostic workup.   ----------------------------------------- 8:24 PM on 06/16/2023 ----------------------------------------- Patient resting, pain improving.  Discussed with her and she has family that help her at home she has canes, walker, and a wheelchair that she can utilize  Patient's daughter at  the bedside.  Typically would utilize some sort of a Ace wrap or brace, but patient fairly nonadherent with previous items, and family advises very difficult to even get her to use a cane or walker.  At this point no clear indication for bracing.  Will have her follow-up closely with orthopedics.  Discussed with patient and her family at the bedside that there could still be internal injury that is unrealized and she needs close follow-up could be injury to meniscus, ligaments, blood or hemarthrosis in the joint etc. that need close follow-up  Family will be assisting her in  use of her pain medication and potential side effects discussed  Return precautions and treatment recommendations and follow-up discussed with the patient who is agreeable with the plan.      FINAL CLINICAL IMPRESSION(S) / ED DIAGNOSES   Final diagnoses:  Fall, initial encounter  Contusion of left knee, initial encounter  Effusion of left knee     Rx / DC Orders   ED Discharge Orders          Ordered    traMADol (ULTRAM) 50 MG tablet  Every 8 hours PRN        06/16/23 2022             Note:  This document was prepared using Dragon voice recognition software and may include unintentional dictation errors.   Sharyn Creamer, MD 06/16/23 2027

## 2023-06-16 NOTE — ED Notes (Signed)
Pt asked EDT to "get up and use the bathroom." EDT offered to do a bed pan for her to be able to void safely due to the nature of her injuries. Pt agreed. Pt wheeled in stretcher to ED Triage 3.   Pt able to raise her hips to help pull the pants and underwear down. Fracture bed pan placed under pt. After placing bed pan in pts desired position of comfort, pt stated "I can't pee." EDT at pts bedside in ED Triage room 3 until and has instructed pt to inform EDT inj room when she is finished.

## 2023-06-16 NOTE — ED Notes (Signed)
Pt continuously pulling, moving her head, and trying to un-do the c-collar placed by [facility] prior to arrival to the ED. Pt asked this EDT multiple times to "take it off of me." EDT informed pt that the cervical collar should remian in place until the EDP clears her neck of any serious injury. C-collar no longer properly in place and is up over her mouth and ears.

## 2023-06-16 NOTE — ED Notes (Signed)
Pt reported being finished voiding in bedpan. Pt raised her hips carefully and bedpan removed. EDT assisted pt in pulling up her underwear and pants to her desired position of comfort. Hospital pillow placed under pts knee for support. Pt denied wanting the ice pack back on her knee. EDT slid pt up in bed for safety and comfort. Pt still asking EDT to take the c-collar off of her neck. EDT reiterated the reason the c-collar must remain on and denied being able to take it off. EDT offered to reposition c-collar and pt denied wanting it repositioned stating "I only want it off."

## 2023-06-16 NOTE — ED Triage Notes (Signed)
Pt to ED [facility] from home, fall in backyard tripped by her dog. C/o pain to left knee. Unsure of hitting head, denies LOC. In c collar on arrival.

## 2023-06-17 ENCOUNTER — Telehealth: Payer: Self-pay | Admitting: Family

## 2023-06-17 ENCOUNTER — Other Ambulatory Visit: Payer: Self-pay | Admitting: Family

## 2023-06-25 DIAGNOSIS — F03A0 Unspecified dementia, mild, without behavioral disturbance, psychotic disturbance, mood disturbance, and anxiety: Secondary | ICD-10-CM | POA: Diagnosis not present

## 2023-06-25 DIAGNOSIS — M25562 Pain in left knee: Secondary | ICD-10-CM | POA: Diagnosis not present

## 2023-06-25 DIAGNOSIS — Z9181 History of falling: Secondary | ICD-10-CM | POA: Diagnosis not present

## 2023-06-25 DIAGNOSIS — N1832 Chronic kidney disease, stage 3b: Secondary | ICD-10-CM | POA: Diagnosis not present

## 2023-06-25 DIAGNOSIS — R8290 Unspecified abnormal findings in urine: Secondary | ICD-10-CM | POA: Diagnosis not present

## 2023-06-25 DIAGNOSIS — E1122 Type 2 diabetes mellitus with diabetic chronic kidney disease: Secondary | ICD-10-CM | POA: Diagnosis not present

## 2023-06-25 DIAGNOSIS — R531 Weakness: Secondary | ICD-10-CM | POA: Diagnosis not present

## 2023-06-25 DIAGNOSIS — Z794 Long term (current) use of insulin: Secondary | ICD-10-CM | POA: Diagnosis not present

## 2023-06-25 DIAGNOSIS — N390 Urinary tract infection, site not specified: Secondary | ICD-10-CM | POA: Diagnosis not present

## 2023-06-25 DIAGNOSIS — I5032 Chronic diastolic (congestive) heart failure: Secondary | ICD-10-CM | POA: Diagnosis not present

## 2023-06-25 DIAGNOSIS — R630 Anorexia: Secondary | ICD-10-CM | POA: Diagnosis not present

## 2023-06-25 DIAGNOSIS — G4700 Insomnia, unspecified: Secondary | ICD-10-CM | POA: Diagnosis not present

## 2023-07-11 ENCOUNTER — Emergency Department
Admission: EM | Admit: 2023-07-11 | Discharge: 2023-07-11 | Disposition: A | Discharge status: Home / Self Care | Payer: PPO | Attending: Emergency Medicine | Admitting: Emergency Medicine

## 2023-07-11 ENCOUNTER — Encounter: Payer: Self-pay | Admitting: Emergency Medicine

## 2023-07-11 ENCOUNTER — Emergency Department: Payer: PPO

## 2023-07-11 ENCOUNTER — Other Ambulatory Visit: Payer: Self-pay

## 2023-07-11 DIAGNOSIS — I10 Essential (primary) hypertension: Secondary | ICD-10-CM | POA: Diagnosis not present

## 2023-07-11 DIAGNOSIS — R0989 Other specified symptoms and signs involving the circulatory and respiratory systems: Secondary | ICD-10-CM | POA: Diagnosis not present

## 2023-07-11 DIAGNOSIS — N179 Acute kidney failure, unspecified: Secondary | ICD-10-CM | POA: Insufficient documentation

## 2023-07-11 DIAGNOSIS — R4182 Altered mental status, unspecified: Secondary | ICD-10-CM | POA: Diagnosis not present

## 2023-07-11 DIAGNOSIS — R410 Disorientation, unspecified: Secondary | ICD-10-CM | POA: Diagnosis not present

## 2023-07-11 DIAGNOSIS — I959 Hypotension, unspecified: Secondary | ICD-10-CM | POA: Diagnosis not present

## 2023-07-11 DIAGNOSIS — R0902 Hypoxemia: Secondary | ICD-10-CM | POA: Diagnosis not present

## 2023-07-11 DIAGNOSIS — R918 Other nonspecific abnormal finding of lung field: Secondary | ICD-10-CM | POA: Diagnosis not present

## 2023-07-11 DIAGNOSIS — R001 Bradycardia, unspecified: Secondary | ICD-10-CM | POA: Diagnosis not present

## 2023-07-11 LAB — URINALYSIS, ROUTINE W REFLEX MICROSCOPIC
Bacteria, UA: NONE SEEN
Bilirubin Urine: NEGATIVE
Glucose, UA: NEGATIVE mg/dL
Hgb urine dipstick: NEGATIVE
Ketones, ur: NEGATIVE mg/dL
Nitrite: NEGATIVE
Protein, ur: NEGATIVE mg/dL
Specific Gravity, Urine: 1.008 (ref 1.005–1.030)
pH: 5 (ref 5.0–8.0)

## 2023-07-11 LAB — COMPREHENSIVE METABOLIC PANEL
ALT: 13 U/L (ref 0–44)
AST: 26 U/L (ref 15–41)
Albumin: 3.8 g/dL (ref 3.5–5.0)
Alkaline Phosphatase: 54 U/L (ref 38–126)
Anion gap: 13 (ref 5–15)
BUN: 46 mg/dL — ABNORMAL HIGH (ref 8–23)
CO2: 25 mmol/L (ref 22–32)
Calcium: 9.4 mg/dL (ref 8.9–10.3)
Chloride: 99 mmol/L (ref 98–111)
Creatinine, Ser: 2.27 mg/dL — ABNORMAL HIGH (ref 0.44–1.00)
GFR, Estimated: 21 mL/min — ABNORMAL LOW (ref >60)
Glucose, Bld: 57 mg/dL — ABNORMAL LOW (ref 70–99)
Potassium: 4 mmol/L (ref 3.5–5.1)
Sodium: 137 mmol/L (ref 135–145)
Total Bilirubin: 1 mg/dL (ref <1.2)
Total Protein: 7.1 g/dL (ref 6.5–8.1)

## 2023-07-11 LAB — CBC
HCT: 34.1 % — ABNORMAL LOW (ref 36.0–46.0)
Hemoglobin: 11 g/dL — ABNORMAL LOW (ref 12.0–15.0)
MCH: 28.2 pg (ref 26.0–34.0)
MCHC: 32.3 g/dL (ref 30.0–36.0)
MCV: 87.4 fL (ref 80.0–100.0)
Platelets: 237 10*3/uL (ref 150–400)
RBC: 3.9 MIL/uL (ref 3.87–5.11)
RDW: 13.6 % (ref 11.5–15.5)
WBC: 7.6 10*3/uL (ref 4.0–10.5)
nRBC: 0 % (ref 0.0–0.2)

## 2023-07-11 LAB — CBG MONITORING, ED: Glucose-Capillary: 72 mg/dL (ref 70–99)

## 2023-07-11 MED ORDER — LACTATED RINGERS IV BOLUS
1000.0000 mL | Freq: Once | INTRAVENOUS | Status: AC
  Administered 2023-07-11: 1000 mL via INTRAVENOUS

## 2023-07-11 NOTE — ED Provider Notes (Signed)
[facility] Provider Note    Event Date/Time   First MD Initiated Contact with Patient 07/11/23 0200     (approximate)   History   Altered Mental Status   HPI  Yvonne Newton is a 82 y.o. female who presents to the ED for evaluation of Altered Mental Status   Review of PCP visit from about 2 weeks ago.  Loss of appetite after mechanical fall on 10/15.  Concerns for acute cystitis, had a UA with moderate leukocytes, greater than 50 WBC, bacteria.  Started on Keflex.  Culture with no particular organism, just mixed urogenital flora  Patient comes into the ED by EMS for evaluation of episode of altered mentation.  Patient does not know why she is here and reports that she feels fine.  Vast majority of history is provided by the patient's daughter at the bedside.  Daughter reports coming home from work around midnight, second shift, and noting that patient was "talking out of her head" and seemed off in a generalized fashion.  Acknowledges that patient seems normal and fine now that they are in the ER.  They do report that for the past few weeks, since this fall nearly a month ago, she has had less of an appetite.  No fevers, emesis, diarrhea, dysuria   Physical Exam   Triage Vital Signs: ED Triage Vitals  Encounter Vitals Group     BP 07/11/23 0145 (!) 142/50     Systolic BP Percentile --      Diastolic BP Percentile --      Pulse Rate 07/11/23 0145 64     Resp 07/11/23 0145 18     Temp 07/11/23 0150 97.7 F (36.5 C)     Temp Source 07/11/23 0150 Oral     SpO2 07/11/23 0145 100 %     Weight --      Height --      Head Circumference --      Peak Flow --      Pain Score 07/11/23 0146 0     Pain Loc --      Pain Education --      Exclude from Growth Chart --     Most recent vital signs: Vitals:   07/11/23 0615 07/11/23 0628  BP:  108/62  Pulse: 62   Resp: 18   Temp:  98 F (36.7 C)  SpO2: 97%     General: Awake, no distress.   CV:  Good peripheral perfusion.  Resp:  Normal effort.  Abd:  No distention.  MSK:  No deformity noted.  Neuro:  No focal deficits appreciated. Other:     ED Results / Procedures / Treatments   Labs (all labs ordered are listed, but only abnormal results are displayed) Labs Reviewed  COMPREHENSIVE METABOLIC PANEL - Abnormal; Notable for the following components:      Result Value   Glucose, Bld 57 (*)    BUN 46 (*)    Creatinine, Ser 2.27 (*)    GFR, Estimated 21 (*)    All other components within normal limits  CBC - Abnormal; Notable for the following components:   Hemoglobin 11.0 (*)    HCT 34.1 (*)    All other components within normal limits  URINALYSIS, ROUTINE W REFLEX MICROSCOPIC - Abnormal; Notable for the following components:   Color, Urine STRAW (*)    APPearance CLEAR (*)    Leukocytes,Ua TRACE (*)    All other components within  normal limits  CBG MONITORING, ED    EKG Sinus rhythm with a rate of 61 bpm.  Normal axis and intervals.  No clear signs of acute ischemia.  RADIOLOGY CXR interpreted by me without evidence of acute cardiopulmonary pathology.  Official radiology report(s): CT HEAD WO CONTRAST ( )  Result Date: 07/11/2023 CLINICAL DATA:  82 year old female with altered mental status. EXAM: CT HEAD WITHOUT CONTRAST TECHNIQUE: Contiguous axial images were obtained from the base of the skull through the vertex without intravenous contrast. RADIATION DOSE REDUCTION: This exam was performed according to the departmental dose-optimization program which includes automated exposure control, adjustment of the mA and/or kV according to patient size and/or use of iterative reconstruction technique. COMPARISON:  Head CT 06/16/2023.  Brain MRI 10/31/2022. FINDINGS: Brain: No midline shift, ventriculomegaly, mass effect, evidence of mass lesion, intracranial hemorrhage or evidence of cortically based acute infarction. Chronic encephalomalacia in the left superior  cerebellar artery territory, lateral right occipital lobe. Stable gray-white matter differentiation throughout the brain. Patchy additional white matter hypodensity. Vascular: No suspicious intracranial vascular hyperdensity. Calcified atherosclerosis at the skull base. Skull: Stable, intact. Sinuses/Orbits: Visualized paranasal sinuses and mastoids are stable and well aerated. Other: No acute orbit or scalp soft tissue finding. IMPRESSION: 1. No acute intracranial abnormality. 2. 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 Electronically Signed   By: Odessa Fleming M.D.   On: 07/11/2023 05:32   DG Chest Portable 1 View  Result Date: 07/11/2023 CLINICAL DATA:  Hypoxia with altered mental status. EXAM: PORTABLE CHEST 1 VIEW COMPARISON:  July 11, 2022 FINDINGS: The heart size and mediastinal contours are within normal limits. There is marked severity calcification of the aortic arch. Low lung volumes are seen with mild, diffuse, chronic appearing increased lung markings. Mild linear scarring and/or atelectasis is seen within the periphery of the left lung base. There is no evidence of an acute infiltrate, pleural effusion or pneumothorax. Multilevel degenerative changes are noted throughout the thoracic spine with mild to moderate severity dextroscoliosis of the lower thoracic and upper lumbar spine. IMPRESSION: Low lung volumes with mild left basilar linear scarring and/or atelectasis. Electronically Signed   By: Aram Candela M.D.   On: 07/11/2023 04:39    PROCEDURES and INTERVENTIONS:  Procedures  Medications  lactated ringers bolus 1,000 mL (0 mLs Intravenous Stopped 07/11/23 0601)     IMPRESSION / MDM / ASSESSMENT AND PLAN / ED COURSE  I reviewed the triage vital signs and the nursing notes.  Differential diagnosis includes, but is not limited to, sepsis, UTI, ICH, CVA, dehydration, stroke or seizure  {Patient presents with symptoms of an acute  illness or injury that is potentially life-threatening.  Patient with dementia presents after brief episode of confusion, possibly sundowning versus relative dehydration and mild AKI, ultimately suitable for outpatient management.  Looks well here in the ED.  Neurologically intact without signs of trauma.  Mild AKI noted on her blood work that I suspect is prerenal from her poor intake.  Remains at baseline will being observed about 5 hours in the ED.  Benign workup.  I considered observation admission but daughter wants to take her home and I think this is reasonable.  We discussed return precautions.  She is tolerating p.o. intake and her mild hypoglycemia noted on serum labs resolved with p.o. intake and remains normal on rechecks.  Clinical Course as of 07/11/23 0734  Sat Jul 11, 2023  2956 I call her daughter, Marylu Lund.  [DS]  3474 Chemistry from 2 weeks ago w Cr 1.4, GFR 38 [DS]  0409 At the bedside I discussed with both daughters.  They acknowledge that she seems okay now essentially back to normal.  We discussed plan of care. [DS]  2595 Reassessed, feels well.  [DS]    Clinical Course User Index [DS] Delton Prairie, MD     FINAL CLINICAL IMPRESSION(S) / ED DIAGNOSES   Final diagnoses:  Confusion  AKI (acute kidney injury) (HCC)     Rx / DC Orders   ED Discharge Orders     None        Note:  This document was prepared using Dragon voice recognition software and may include unintentional dictation errors.   Delton Prairie, MD 07/11/23 [id number]

## 2023-07-11 NOTE — ED Notes (Signed)
Family at bedside, patient alert oriented,  given meal tray and juice at 0400

## 2023-07-11 NOTE — ED Notes (Signed)
Patient drank two orange juices ate a sandwich and half an apple sauce

## 2023-07-11 NOTE — Discharge Instructions (Addendum)
Please follow-up with her primary care doctor.  I suspect she would benefit from a recheck of her kidney function (it was slightly worse today than her typical chronic kidney disease.

## 2023-07-11 NOTE — ED Triage Notes (Addendum)
Pt presents with EMS from home. Family reports hx of dementia but is more altered from baseline.  Family reports she started talking to herself and "talking out of her head" about 6pm.  Pt is alert and oriented at time of triage.  Answers questions appropriately.  Pt was placed on [place] by fire dept for sats of 93%. Pt is 100% on RA on arrival.

## 2023-07-15 ENCOUNTER — Other Ambulatory Visit: Payer: Self-pay

## 2023-07-15 ENCOUNTER — Other Ambulatory Visit: Payer: Self-pay | Admitting: Internal Medicine

## 2023-07-15 DIAGNOSIS — I10 Essential (primary) hypertension: Secondary | ICD-10-CM

## 2023-07-16 DIAGNOSIS — S72492A Other fracture of lower end of left femur, initial encounter for closed fracture: Secondary | ICD-10-CM | POA: Diagnosis not present

## 2023-07-17 DIAGNOSIS — D6859 Other primary thrombophilia: Secondary | ICD-10-CM | POA: Diagnosis not present

## 2023-07-17 DIAGNOSIS — D692 Other nonthrombocytopenic purpura: Secondary | ICD-10-CM | POA: Diagnosis not present

## 2023-07-17 DIAGNOSIS — I509 Heart failure, unspecified: Secondary | ICD-10-CM | POA: Diagnosis not present

## 2023-07-21 ENCOUNTER — Other Ambulatory Visit: Payer: Self-pay | Admitting: Internal Medicine

## 2023-07-21 ENCOUNTER — Other Ambulatory Visit: Payer: Self-pay | Admitting: Family

## 2023-07-24 ENCOUNTER — Encounter: Payer: Self-pay | Admitting: Internal Medicine

## 2023-07-24 ENCOUNTER — Ambulatory Visit: Payer: PPO | Attending: Internal Medicine | Admitting: Internal Medicine

## 2023-07-24 VITALS — BP 126/50 | HR 67 | Ht 60.0 in | Wt 149.6 lb

## 2023-07-24 DIAGNOSIS — I4729 Other ventricular tachycardia: Secondary | ICD-10-CM | POA: Diagnosis not present

## 2023-07-24 NOTE — Patient Instructions (Signed)

## 2023-07-24 NOTE — Progress Notes (Signed)
HPI Yvonne Newton returns today for ongoing evaluation of near syncope. Since I saw her last she has been stable. She had her ILR removed at that visit and in the interim has passed out when she tripped over a tree root. No other symptoms except for progressive weight loss and memory loss.   Allergies  Allergen Reactions   Blueberry Flavor Hives and Shortness Of Breath   Influenza Vaccines    Sulfa Antibiotics Itching   Sulfonamide Derivatives Itching   Other Swelling and Rash    METAL     Current Outpatient Medications  Medication Sig Dispense Refill   aspirin EC 81 MG EC tablet Take 1 tablet (81 mg total) by mouth daily.     carvedilol (COREG) 12.5 MG tablet TAKE 1 TAB BY MOUTH 2 TIMES DAILY. PATIENT NEEDS APPOINTMENT WITH PROVIDER FOR ANY FUTURE REFILLS. 90 tablet 1   CYANOCOBALAMIN IJ Inject as directed every 30 (thirty) days. On or about the 26th of each month     diltiazem (CARDIZEM CD) 120 MG 24 hr capsule TAKE 1 CAPSULE BY MOUTH EVERY DAY 90 capsule 0   furosemide (LASIX) 40 MG tablet TAKE 2 TABLETS BY MOUTH EVERY DAY 180 tablet 1   KLOR-CON M20 20 MEQ tablet TAKE 2 TABLETS BY MOUTH EVERY DAY 180 tablet 1   losartan (COZAAR) 25 MG tablet TAKE 1/2 TABLET BY MOUTH EVERY DAY 45 tablet 1   metFORMIN (GLUCOPHAGE-XR) 500 MG 24 hr tablet Take 1,000 mg by mouth 2 (two) times daily with a meal.     rosuvastatin (CRESTOR) 5 MG tablet Take 1 tablet (5 mg total) by mouth daily at 6 PM. 90 tablet 3   traMADol (ULTRAM) 50 MG tablet Take 1 tablet (50 mg total) by mouth every 8 (eight) hours as needed for severe pain (pain score 7-10). 14 tablet 0   memantine (NAMENDA TITRATION PACK) tablet pack Take 1 tablet by mouth in the morning and at bedtime.     No current facility-administered medications for this visit.     Past Medical History:  Diagnosis Date   Arthritis 07/22/2017   Cerebellar stroke (HCC)    CHF (congestive heart failure) (HCC)    Diabetes mellitus type 2 in  nonobese (HCC)    DM (diabetes mellitus) (HCC)    Dysarthria    Essential hypertension 07/22/2017   Hyperlipidemia, unspecified 07/22/2017   OA (osteoarthritis) of knee    right   Obesity    Stroke (HCC)    Tachycardia 07/22/2017   VENTRICULAR TACHYCARDIA 09/05/2009   Qualifier: Diagnosis of  By: Susette Racer CMA, Jewel     Ventricular tachycardia (HCC)    Vitamin B 12 deficiency     ROS:   All systems reviewed and negative except as noted in the HPI.   Past Surgical History:  Procedure Laterality Date   BREAST BIOPSY Left 2010   Negative   CORONARY PRESSURE/FFR STUDY N/A 07/14/2022   Procedure: INTRAVASCULAR PRESSURE WIRE/FFR STUDY;  Surgeon: Iran Ouch, MD;  Location: [facility] INVASIVE CV LAB;  Service: Cardiovascular;  Laterality: N/A;   LEFT HEART CATH AND CORONARY ANGIOGRAPHY N/A 07/14/2022   Procedure: LEFT HEART CATH AND CORONARY ANGIOGRAPHY;  Surgeon: Iran Ouch, MD;  Location: [facility] INVASIVE CV LAB;  Service: Cardiovascular;  Laterality: N/A;   LOOP RECORDER INSERTION N/A 11/03/2017   Procedure: LOOP RECORDER INSERTION;  Surgeon: Regan Lemming, MD;  Location: [facility] INVASIVE CV LAB;  Service: Cardiovascular;  Laterality: N/A;  TEE WITHOUT CARDIOVERSION N/A 11/03/2017   Procedure: TRANSESOPHAGEAL ECHOCARDIOGRAM (TEE);  Surgeon: Lars Masson, MD;  Location: [facility] ENDOSCOPY;  Service: Cardiovascular;  Laterality: N/A;     Family History  Problem Relation Age of Onset   Heart attack Father 68   Heart attack Brother 47   Heart attack Brother        had CABG   Breast cancer Neg Hx      Social History   Socioeconomic History   Marital status: Widowed    Spouse name: Not on file   Number of children: Not on file   Years of education: Not on file   Highest education level: Not on file  Occupational History   Not on file  Tobacco Use   Smoking status: Former    Current packs/day: 0.00    Types: Cigarettes    Quit date: 09/01/1998    Years since quitting:  24.9   Smokeless tobacco: Never  Vaping Use   Vaping status: Never Used  Substance and Sexual Activity   Alcohol use: No   Drug use: No   Sexual activity: Not on file  Other Topics Concern   Not on file  Social History Narrative   Not on file   Social Determinants of Health   Financial Resource Strain: Not on file  Food Insecurity: Not on file  Transportation Needs: Not on file  Physical Activity: Not on file  Stress: Not on file  Social Connections: Not on file  Intimate Partner Violence: Not on file     BP (!) 126/50   Pulse 67   Ht 5' (1.524 m)   Wt 149 lb 9.6 oz (67.9 kg)   SpO2 94%   BMI 29.22 kg/m   Physical Exam:  elderly appearing NAD HEENT: Unremarkable Neck:  No JVD, no thyromegally Lymphatics:  No adenopathy Back:  No CVA tenderness Lungs:  Clear with no wheezes HEART:  Regular rate rhythm, no murmurs, no rubs, no clicks Abd:  soft, positive bowel sounds, no organomegally, no rebound, no guarding Ext:  2 plus pulses, no edema, no cyanosis, no clubbing Skin:  No rashes no nodules Neuro:  CN II through XII intact, motor grossly intact   Assess/Plan: VT - she is asymptomatic. No change in her meds.  Syncope - no recurrent episodes. Progressive dementia - she will need a visit with her medical MD's.   Sharlot Gowda Eboni Coval,MD

## 2023-07-28 ENCOUNTER — Other Ambulatory Visit: Payer: Self-pay | Admitting: Family

## 2023-07-28 ENCOUNTER — Other Ambulatory Visit: Payer: Self-pay | Admitting: Internal Medicine

## 2023-07-28 DIAGNOSIS — I10 Essential (primary) hypertension: Secondary | ICD-10-CM

## 2023-07-29 NOTE — Telephone Encounter (Signed)
Refilled prescription, and called patient to reschedule missed appt. Left voicemail to call office to reschedule.

## 2023-08-04 DIAGNOSIS — Z9181 History of falling: Secondary | ICD-10-CM | POA: Diagnosis not present

## 2023-08-04 DIAGNOSIS — M1711 Unilateral primary osteoarthritis, right knee: Secondary | ICD-10-CM | POA: Diagnosis not present

## 2023-08-04 DIAGNOSIS — S72492A Other fracture of lower end of left femur, initial encounter for closed fracture: Secondary | ICD-10-CM | POA: Diagnosis not present

## 2023-09-01 DIAGNOSIS — M1712 Unilateral primary osteoarthritis, left knee: Secondary | ICD-10-CM | POA: Diagnosis not present

## 2023-09-01 DIAGNOSIS — S72492A Other fracture of lower end of left femur, initial encounter for closed fracture: Secondary | ICD-10-CM | POA: Diagnosis not present

## 2023-09-15 ENCOUNTER — Other Ambulatory Visit: Payer: Self-pay | Admitting: Internal Medicine

## 2023-09-15 DIAGNOSIS — I10 Essential (primary) hypertension: Secondary | ICD-10-CM

## 2023-11-18 DIAGNOSIS — N184 Chronic kidney disease, stage 4 (severe): Secondary | ICD-10-CM | POA: Diagnosis not present

## 2023-11-18 DIAGNOSIS — L03011 Cellulitis of right finger: Secondary | ICD-10-CM | POA: Diagnosis not present

## 2023-11-20 DIAGNOSIS — M1A9XX1 Chronic gout, unspecified, with tophus (tophi): Secondary | ICD-10-CM | POA: Diagnosis not present

## 2023-11-20 DIAGNOSIS — M79644 Pain in right finger(s): Secondary | ICD-10-CM | POA: Diagnosis not present

## 2023-11-20 DIAGNOSIS — M151 Heberden's nodes (with arthropathy): Secondary | ICD-10-CM | POA: Diagnosis not present

## 2023-11-23 DIAGNOSIS — Z794 Long term (current) use of insulin: Secondary | ICD-10-CM | POA: Diagnosis not present

## 2023-11-23 DIAGNOSIS — E1122 Type 2 diabetes mellitus with diabetic chronic kidney disease: Secondary | ICD-10-CM | POA: Diagnosis not present

## 2023-11-23 DIAGNOSIS — N1832 Chronic kidney disease, stage 3b: Secondary | ICD-10-CM | POA: Diagnosis not present

## 2023-11-23 DIAGNOSIS — M1A9XX1 Chronic gout, unspecified, with tophus (tophi): Secondary | ICD-10-CM | POA: Diagnosis not present

## 2023-12-01 ENCOUNTER — Other Ambulatory Visit: Payer: Self-pay | Admitting: Internal Medicine

## 2023-12-08 ENCOUNTER — Other Ambulatory Visit: Payer: Self-pay | Admitting: Family

## 2023-12-22 ENCOUNTER — Other Ambulatory Visit: Payer: Self-pay

## 2023-12-22 ENCOUNTER — Ambulatory Visit
Admission: EM | Admit: 2023-12-22 | Discharge: 2023-12-22 | Disposition: A | Discharge status: Home / Self Care | Attending: Emergency Medicine | Admitting: Emergency Medicine

## 2023-12-22 ENCOUNTER — Encounter: Payer: Self-pay | Admitting: Emergency Medicine

## 2023-12-22 DIAGNOSIS — R350 Frequency of micturition: Secondary | ICD-10-CM | POA: Insufficient documentation

## 2023-12-22 LAB — POCT URINALYSIS DIP (MANUAL ENTRY)
Bilirubin, UA: NEGATIVE
Blood, UA: NEGATIVE
Glucose, UA: NEGATIVE mg/dL
Ketones, POC UA: NEGATIVE mg/dL
Leukocytes, UA: NEGATIVE
Nitrite, UA: NEGATIVE
Protein Ur, POC: NEGATIVE mg/dL
Spec Grav, UA: 1.015
Urobilinogen, UA: 1 U/dL
pH, UA: 8

## 2023-12-22 MED ORDER — CEPHALEXIN 500 MG PO CAPS
500.0000 mg | ORAL_CAPSULE | Freq: Two times a day (BID) | ORAL | 0 refills | Status: AC
Start: 2023-12-22 — End: 2023-12-27

## 2023-12-22 MED ORDER — LIDOCAINE VISCOUS HCL 2 % MT SOLN: 15.0000 mL | OROMUCOSAL | 0 refills | Status: DC | PRN

## 2023-12-22 NOTE — Discharge Instructions (Addendum)
 Patient has been evaluated for confusion  Urinalysis is negative, has been sent to the lab for 3 days to see if bacteria will grow, you will be notified if this occurs  Initiating medication prophylactically as she does have urinary frequency, take cephalexin  twice daily for 5 days  For her gum discomfort she may gargle or apply the viscous lidocaine  every 4 hours over the affected area to temporarily provide relief  Please schedule follow-up appointment with your primary doctor for further evaluation for any symptoms worsening severity please go to the nearest emergency department

## 2023-12-22 NOTE — ED Triage Notes (Signed)
 Patient presents to [facility] for evaluation of  waking her daughter up in the middle of the night saying there were people trying to break into the house, or had been in the house.  Patient seem to know that isn't true now in room, but some confusion in room and tangential speech.

## 2023-12-22 NOTE — ED Provider Notes (Signed)
 Yvonne Newton    CSN: [id number] Arrival date & time: 12/22/23  1703      History   Chief Complaint Chief Complaint  Patient presents with   Altered Mental Status    HPI Yvonne Newton is a 83 y.o. female.   Presents for evaluation of urinary frequency and visual hallucination beginning yesterday evening.  History of dementia and confusion has occurred before.  Denies hematuria abdominal pain, flank pain, fevers.  When symptoms initiated daughter checked blood sugar and it was 116 and blood pressure within normal range.  Denies double dosing of medication.   Past Medical History:  Diagnosis Date   Arthritis 07/22/2017   Cerebellar stroke (HCC)    CHF (congestive heart failure) (HCC)    Diabetes mellitus type 2 in nonobese (HCC)    DM (diabetes mellitus) (HCC)    Dysarthria    Essential hypertension 07/22/2017   Hyperlipidemia, unspecified 07/22/2017   OA (osteoarthritis) of knee    right   Obesity    Stroke (HCC)    Tachycardia 07/22/2017   VENTRICULAR TACHYCARDIA 09/05/2009   Qualifier: Diagnosis of  By: Sallyanne Creamer CMA, Jewel     Ventricular tachycardia (HCC)    Vitamin B 12 deficiency     Patient Active Problem List   Diagnosis Date Noted   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   Coronary artery disease involving native coronary artery of native heart with unstable angina pectoris (HCC) 07/12/2022   NSTEMI (non-ST elevated myocardial infarction) (HCC) 07/11/2022   History of cerebellar stroke 2019 07/11/2022   History of ventricular tachycardia 07/11/2022   Chest pain 07/11/2022   Dementia without behavioral disturbance (HCC) 07/11/2022   Acute respiratory failure with hypoxia (HCC) 07/11/2022   Acute on chronic diastolic CHF (congestive heart failure) (HCC) 07/11/2022   Cerebellar stroke (HCC)    Dysarthria    Diabetes mellitus type 2 in nonobese (HCC)    Allergy 07/22/2017   Arthritis 07/22/2017   Diabetes mellitus type 2 with complications  (HCC) 07/22/2017   Hyperlipidemia, unspecified 07/22/2017   Essential hypertension 07/22/2017   VT (ventricular tachycardia) (HCC) 07/22/2017   OBESITY 09/05/2009   VENTRICULAR TACHYCARDIA 09/05/2009    Past Surgical History:  Procedure Laterality Date   BREAST BIOPSY Left 2010   Negative   CORONARY PRESSURE/FFR STUDY N/A 07/14/2022   Procedure: INTRAVASCULAR PRESSURE WIRE/FFR STUDY;  Surgeon: Wenona Hamilton, MD;  Location: [facility] INVASIVE CV LAB;  Service: Cardiovascular;  Laterality: N/A;   LEFT HEART CATH AND CORONARY ANGIOGRAPHY N/A 07/14/2022   Procedure: LEFT HEART CATH AND CORONARY ANGIOGRAPHY;  Surgeon: Wenona Hamilton, MD;  Location: [facility] INVASIVE CV LAB;  Service: Cardiovascular;  Laterality: N/A;   LOOP RECORDER INSERTION N/A 11/03/2017   Procedure: LOOP RECORDER INSERTION;  Surgeon: Lei Pump, MD;  Location: [facility] INVASIVE CV LAB;  Service: Cardiovascular;  Laterality: N/A;   TEE WITHOUT CARDIOVERSION N/A 11/03/2017   Procedure: TRANSESOPHAGEAL ECHOCARDIOGRAM (TEE);  Surgeon: Liza Riggers, MD;  Location: [facility] ENDOSCOPY;  Service: Cardiovascular;  Laterality: N/A;    OB History   No obstetric history on file.      Home Medications    Prior to Admission medications   Medication Sig Start Date End Date Taking? Authorizing Provider  cephALEXin  (KEFLEX ) 500 MG capsule Take 1 capsule (500 mg total) by mouth 2 (two) times daily for 5 days. 12/22/23 12/27/23 Yes Owen Pagnotta R, NP  glipiZIDE (GLUCOTROL XL) 2.5 MG 24 hr tablet Take 1 tablet by mouth daily. 09/17/23  Yes [provider]  ibuprofen (ADVIL) 800 MG tablet Take 800 mg by mouth 3 (three) times daily. 10/20/23  Yes [provider]  mirtazapine (REMERON) 7.5 MG tablet Take 1 tablet by mouth at bedtime. 08/18/23  Yes [provider]  pantoprazole  (PROTONIX ) 20 MG tablet Take 20 mg by mouth. 06/18/23  Yes [provider]  aspirin  EC 81 MG EC tablet Take 1 tablet (81 mg  total) by mouth daily. 11/05/17   Rizwan, Saima, MD  carvedilol  (COREG ) 12.5 MG tablet Take 1 tablet (12.5 mg total) by mouth 2 (two) times daily with a meal. 09/16/23   Tammie Fall, MD  colchicine 0.6 MG tablet Take 0.3 mg by mouth daily.    [provider]  CYANOCOBALAMIN  IJ Inject as directed every 30 (thirty) days. On or about the 26th of each month    [provider]  diltiazem  (CARDIZEM  CD) 120 MG 24 hr capsule TAKE 1 CAPSULE BY MOUTH EVERY DAY 12/02/23   Tammie Fall, MD  furosemide  (LASIX ) 40 MG tablet TAKE 2 TABLETS BY MOUTH EVERY DAY 07/22/23   Charlette Console, FNP  KLOR-CON  M20 20 MEQ tablet TAKE 2 TABLETS BY MOUTH EVERY DAY 07/29/23   Shawnee Dellen A, FNP  lidocaine  (XYLOCAINE ) 2 % solution Use as directed 15 mLs in the mouth or throat every 4 (four) hours as needed. 12/22/23   Reena Canning, NP  losartan  (COZAAR ) 25 MG tablet TAKE 1/2 TABLET BY MOUTH DAILY 12/15/23   Shawnee Dellen A, FNP  memantine  (NAMENDA  TITRATION PACK) tablet pack Take 1 tablet by mouth in the morning and at bedtime. 04/17/22 04/17/23  [provider]  metFORMIN (GLUCOPHAGE-XR) 500 MG 24 hr tablet Take 1,000 mg by mouth 2 (two) times daily with a meal. 01/25/16   [provider]  rosuvastatin  (CRESTOR ) 5 MG tablet Take 1 tablet (5 mg total) by mouth daily at 6 PM. 01/19/18   Johny Nap, NP  traMADol  (ULTRAM ) 50 MG tablet Take 1 tablet (50 mg total) by mouth every 8 (eight) hours as needed for severe pain (pain score 7-10). 06/16/23   Iver Marker, MD    Family History Family History  Problem Relation Age of Onset   Heart attack Father 70   Heart attack Brother 26   Heart attack Brother        had CABG   Breast cancer Neg Hx     Social History Social History   Tobacco Use   Smoking status: Former    Current packs/day: 0.00    Types: Cigarettes    Quit date: 09/01/1998    Years since quitting: 25.3   Smokeless tobacco: Never  Vaping Use   Vaping status: Never  Used  Substance Use Topics   Alcohol use: No   Drug use: No     Allergies   Blueberry flavoring agent (non-screening), Influenza vaccines, Sulfa antibiotics, Sulfonamide derivatives, and Other   Review of Systems Review of Systems   Physical Exam Triage Vital Signs ED Triage Vitals [12/22/23 1719]  Encounter Vitals Group     BP (!) 138/55     Systolic BP Percentile      Diastolic BP Percentile      Pulse Rate 75     Resp 18     Temp 98.7 F (37.1 C)     Temp Source Oral     SpO2 93 %     Weight      Height  Head Circumference      Peak Flow      Pain Score      Pain Loc      Pain Education      Exclude from Growth Chart    No data found.  Updated Vital Signs BP (!) 138/55 (BP Location: Left Arm)   Pulse 75   Temp 98.7 F (37.1 C) (Oral)   Resp 18   SpO2 93%   Visual Acuity Right Eye Distance:   Left Eye Distance:   Bilateral Distance:    Right Eye Near:   Left Eye Near:    Bilateral Near:     Physical Exam Constitutional:      Appearance: Normal appearance.  Eyes:     Extraocular Movements: Extraocular movements intact.  Pulmonary:     Effort: Pulmonary effort is normal.  Neurological:     General: No focal deficit present.     Mental Status: She is alert and oriented to person, place, and time. Mental status is at baseline.      UC Treatments / Results  Labs (all labs ordered are listed, but only abnormal results are displayed) Labs Reviewed  POCT URINALYSIS DIP (MANUAL ENTRY) - Normal  URINE CULTURE    EKG   Radiology No results found.  Procedures Procedures (including critical care time)  Medications Ordered in UC Medications - No data to display  Initial Impression / Assessment and Plan / UC Course  I have reviewed the triage vital signs and the nursing notes.  Pertinent labs & imaging results that were available during my care of the patient were reviewed by me and considered in my medical decision making (see chart  for details).  Urinary frequency, visual hallucinations  Vital signs are stable, patient in no signs of distress nontoxic-appearing, oriented x 3 however patient is having tangents of various information, unrelated to visit, urinalysis negative, sent for culture, treating prophylactically with cephalexin  and advised supportive care, for persisting symptoms recommended PCP follow-up for further evaluation and for worsening symptoms recommended ER evaluation     Final Clinical Impressions(s) / UC Diagnoses   Final diagnoses:  Urinary frequency     Discharge Instructions      Patient has been evaluated for confusion  Urinalysis is negative, has been sent to the lab for 3 days to see if bacteria will grow, you will be notified if this occurs  Initiating medication prophylactically as she does have urinary frequency, take cephalexin  twice daily for 5 days  For her gum discomfort she may gargle or apply the viscous lidocaine  every 4 hours over the affected area to temporarily provide relief  Please schedule follow-up appointment with your primary doctor for further evaluation for any symptoms worsening severity please go to the nearest emergency department   ED Prescriptions     Medication Sig Dispense Auth. Provider   cephALEXin  (KEFLEX ) 500 MG capsule Take 1 capsule (500 mg total) by mouth 2 (two) times daily for 5 days. 10 capsule Lanora Reveron R, NP   lidocaine  (XYLOCAINE ) 2 % solution  (Status: Discontinued) Use as directed 15 mLs in the mouth or throat as needed. 100 mL Jakeline Dave R, NP   lidocaine  (XYLOCAINE ) 2 % solution Use as directed 15 mLs in the mouth or throat every 4 (four) hours as needed. 100 mL Reena Canning, NP      PDMP not reviewed this encounter.   Reena Canning, [place] 12/22/23 [id number]

## 2023-12-24 LAB — URINE CULTURE: Culture: 20000 — AB

## 2024-01-05 ENCOUNTER — Other Ambulatory Visit: Payer: Self-pay | Admitting: Family

## 2024-01-11 NOTE — Telephone Encounter (Signed)
 Unable to reach patient to attempt to r/s missed appts. LVM. Please advise on refills.

## 2024-02-16 DIAGNOSIS — E785 Hyperlipidemia, unspecified: Secondary | ICD-10-CM | POA: Diagnosis not present

## 2024-02-16 DIAGNOSIS — E1122 Type 2 diabetes mellitus with diabetic chronic kidney disease: Secondary | ICD-10-CM | POA: Diagnosis not present

## 2024-02-16 DIAGNOSIS — M810 Age-related osteoporosis without current pathological fracture: Secondary | ICD-10-CM | POA: Diagnosis not present

## 2024-02-16 DIAGNOSIS — N1832 Chronic kidney disease, stage 3b: Secondary | ICD-10-CM | POA: Diagnosis not present

## 2024-02-16 DIAGNOSIS — I152 Hypertension secondary to endocrine disorders: Secondary | ICD-10-CM | POA: Diagnosis not present

## 2024-02-16 DIAGNOSIS — E1159 Type 2 diabetes mellitus with other circulatory complications: Secondary | ICD-10-CM | POA: Diagnosis not present

## 2024-02-16 DIAGNOSIS — M1A9XX1 Chronic gout, unspecified, with tophus (tophi): Secondary | ICD-10-CM | POA: Diagnosis not present

## 2024-02-16 DIAGNOSIS — E559 Vitamin D deficiency, unspecified: Secondary | ICD-10-CM | POA: Diagnosis not present

## 2024-03-29 ENCOUNTER — Other Ambulatory Visit: Payer: Self-pay | Admitting: Family

## 2024-03-29 DIAGNOSIS — R600 Localized edema: Secondary | ICD-10-CM | POA: Diagnosis not present

## 2024-03-29 DIAGNOSIS — N189 Chronic kidney disease, unspecified: Secondary | ICD-10-CM | POA: Diagnosis not present

## 2024-03-29 DIAGNOSIS — E1122 Type 2 diabetes mellitus with diabetic chronic kidney disease: Secondary | ICD-10-CM | POA: Diagnosis not present

## 2024-03-29 DIAGNOSIS — E790 Hyperuricemia without signs of inflammatory arthritis and tophaceous disease: Secondary | ICD-10-CM | POA: Diagnosis not present

## 2024-03-29 DIAGNOSIS — R8290 Unspecified abnormal findings in urine: Secondary | ICD-10-CM | POA: Diagnosis not present

## 2024-03-29 DIAGNOSIS — R809 Proteinuria, unspecified: Secondary | ICD-10-CM | POA: Diagnosis not present

## 2024-03-29 DIAGNOSIS — N1832 Chronic kidney disease, stage 3b: Secondary | ICD-10-CM | POA: Diagnosis not present

## 2024-03-29 DIAGNOSIS — E1129 Type 2 diabetes mellitus with other diabetic kidney complication: Secondary | ICD-10-CM | POA: Diagnosis not present

## 2024-03-29 DIAGNOSIS — I10 Essential (primary) hypertension: Secondary | ICD-10-CM | POA: Diagnosis not present

## 2024-04-21 ENCOUNTER — Other Ambulatory Visit: Payer: Self-pay

## 2024-04-21 ENCOUNTER — Emergency Department

## 2024-04-21 ENCOUNTER — Emergency Department
Admission: EM | Admit: 2024-04-21 | Discharge: 2024-04-22 | Disposition: A | Discharge status: Home / Self Care | Attending: Emergency Medicine | Admitting: Emergency Medicine

## 2024-04-21 DIAGNOSIS — Z7984 Long term (current) use of oral hypoglycemic drugs: Secondary | ICD-10-CM | POA: Insufficient documentation

## 2024-04-21 DIAGNOSIS — I700 Atherosclerosis of aorta: Secondary | ICD-10-CM | POA: Diagnosis not present

## 2024-04-21 DIAGNOSIS — E119 Type 2 diabetes mellitus without complications: Secondary | ICD-10-CM | POA: Diagnosis not present

## 2024-04-21 DIAGNOSIS — R4182 Altered mental status, unspecified: Secondary | ICD-10-CM | POA: Insufficient documentation

## 2024-04-21 DIAGNOSIS — Z79899 Other long term (current) drug therapy: Secondary | ICD-10-CM | POA: Insufficient documentation

## 2024-04-21 DIAGNOSIS — R443 Hallucinations, unspecified: Secondary | ICD-10-CM | POA: Insufficient documentation

## 2024-04-21 DIAGNOSIS — I509 Heart failure, unspecified: Secondary | ICD-10-CM | POA: Diagnosis not present

## 2024-04-21 DIAGNOSIS — I6782 Cerebral ischemia: Secondary | ICD-10-CM | POA: Diagnosis not present

## 2024-04-21 DIAGNOSIS — I10 Essential (primary) hypertension: Secondary | ICD-10-CM | POA: Diagnosis not present

## 2024-04-21 DIAGNOSIS — Z7982 Long term (current) use of aspirin: Secondary | ICD-10-CM | POA: Insufficient documentation

## 2024-04-21 DIAGNOSIS — R1084 Generalized abdominal pain: Secondary | ICD-10-CM | POA: Diagnosis not present

## 2024-04-21 DIAGNOSIS — D649 Anemia, unspecified: Secondary | ICD-10-CM | POA: Diagnosis not present

## 2024-04-21 DIAGNOSIS — R29818 Other symptoms and signs involving the nervous system: Secondary | ICD-10-CM | POA: Diagnosis not present

## 2024-04-21 DIAGNOSIS — I517 Cardiomegaly: Secondary | ICD-10-CM | POA: Diagnosis not present

## 2024-04-21 DIAGNOSIS — R079 Chest pain, unspecified: Secondary | ICD-10-CM | POA: Diagnosis not present

## 2024-04-21 DIAGNOSIS — I110 Hypertensive heart disease with heart failure: Secondary | ICD-10-CM | POA: Diagnosis not present

## 2024-04-21 DIAGNOSIS — R109 Unspecified abdominal pain: Secondary | ICD-10-CM | POA: Diagnosis not present

## 2024-04-21 LAB — URINALYSIS, ROUTINE W REFLEX MICROSCOPIC
Bacteria, UA: NONE SEEN
Bilirubin Urine: NEGATIVE
Glucose, UA: NEGATIVE mg/dL
Hgb urine dipstick: NEGATIVE
Ketones, ur: NEGATIVE mg/dL
Nitrite: NEGATIVE
Protein, ur: NEGATIVE mg/dL
Specific Gravity, Urine: 1.006 (ref 1.005–1.030)
pH: 6 (ref 5.0–8.0)

## 2024-04-21 LAB — COMPREHENSIVE METABOLIC PANEL WITH GFR
ALT: 16 U/L (ref 0–44)
AST: 26 U/L (ref 15–41)
Albumin: 4.2 g/dL (ref 3.5–5.0)
Alkaline Phosphatase: 84 U/L (ref 38–126)
Anion gap: 15 (ref 5–15)
BUN: 31 mg/dL — ABNORMAL HIGH (ref 8–23)
CO2: 26 mmol/L (ref 22–32)
Calcium: 9.8 mg/dL (ref 8.9–10.3)
Chloride: 100 mmol/L (ref 98–111)
Creatinine, Ser: 1.86 mg/dL — ABNORMAL HIGH (ref 0.44–1.00)
GFR, Estimated: 27 mL/min — ABNORMAL LOW (ref >60)
Glucose, Bld: 127 mg/dL — ABNORMAL HIGH (ref 70–99)
Potassium: 4.2 mmol/L (ref 3.5–5.1)
Sodium: 141 mmol/L (ref 135–145)
Total Bilirubin: 1.1 mg/dL (ref 0.0–1.2)
Total Protein: 7.1 g/dL (ref 6.5–8.1)

## 2024-04-21 LAB — CBC
HCT: 30.5 % — ABNORMAL LOW (ref 36.0–46.0)
Hemoglobin: 9.9 g/dL — ABNORMAL LOW (ref 12.0–15.0)
MCH: 30 pg (ref 26.0–34.0)
MCHC: 32.5 g/dL (ref 30.0–36.0)
MCV: 92.4 fL (ref 80.0–100.0)
Platelets: 172 K/uL (ref 150–400)
RBC: 3.3 MIL/uL — ABNORMAL LOW (ref 3.87–5.11)
RDW: 14.5 % (ref 11.5–15.5)
WBC: 5.8 K/uL (ref 4.0–10.5)
nRBC: 0 % (ref 0.0–0.2)

## 2024-04-21 NOTE — ED Notes (Signed)
 Pt called out and assisted to bedside commode. Urine sample collected and remains at bedside. Pt ambulated without assistance, gait steady. Pt reports feeling dizzy.

## 2024-04-21 NOTE — ED Triage Notes (Signed)
 Pt to ED via POV from UC. Pt here with son. Son reports pt resides with his sister. Son reports pt has been seeing little children run around her house, combative with daughter who she lives with and was yelling her house was the hospital. Pt A&Ox2. Pt denies urinary symptoms. No falls family is aware of.

## 2024-04-21 NOTE — ED Provider Notes (Signed)
 [facility] Provider Note    Event Date/Time   First MD Initiated Contact with Patient 04/21/24 2311     (approximate)   History   Altered Mental Status   HPI  Yvonne Newton is a 83 y.o. female with history of previous cerebellar stroke, hypertension, diabetes, hyperlipidemia, CHF who presents to the emergency department with complaints of confusion, hallucinations.  Son at bedside reports that she has presented similarly with her prior stroke.  She states that she saw all children running around her house today and thought it was the first day of school.  Her son still feels like she is confused but improving.  No headache, numbness, tingling or focal weakness.  No falls.  No new medications.  States she did have some chest pain earlier that has resolved.  No shortness of breath, fever or cough.  No lower extremity swelling or pain.  Also complaining of generalized abdominal pain that is new.  No vomiting, diarrhea but states she does have urinary frequency.  Last known well was yesterday.   History provided by patient, son.    Past Medical History:  Diagnosis Date   Arthritis 07/22/2017   Cerebellar stroke (HCC)    CHF (congestive heart failure) (HCC)    Diabetes mellitus type 2 in nonobese (HCC)    DM (diabetes mellitus) (HCC)    Dysarthria    Essential hypertension 07/22/2017   Hyperlipidemia, unspecified 07/22/2017   OA (osteoarthritis) of knee    right   Obesity    Stroke (HCC)    Tachycardia 07/22/2017   VENTRICULAR TACHYCARDIA 09/05/2009   Qualifier: Diagnosis of  By: Lawernce CMA, Jewel     Ventricular tachycardia (HCC)    Vitamin B 12 deficiency     Past Surgical History:  Procedure Laterality Date   BREAST BIOPSY Left 2010   Negative   CORONARY PRESSURE/FFR STUDY N/A 07/14/2022   Procedure: INTRAVASCULAR PRESSURE WIRE/FFR STUDY;  Surgeon: Darron Deatrice LABOR, MD;  Location: [facility] INVASIVE CV LAB;  Service: Cardiovascular;   Laterality: N/A;   LEFT HEART CATH AND CORONARY ANGIOGRAPHY N/A 07/14/2022   Procedure: LEFT HEART CATH AND CORONARY ANGIOGRAPHY;  Surgeon: Darron Deatrice LABOR, MD;  Location: [facility] INVASIVE CV LAB;  Service: Cardiovascular;  Laterality: N/A;   LOOP RECORDER INSERTION N/A 11/03/2017   Procedure: LOOP RECORDER INSERTION;  Surgeon: Inocencio Soyla Lunger, MD;  Location: [facility] INVASIVE CV LAB;  Service: Cardiovascular;  Laterality: N/A;   TEE WITHOUT CARDIOVERSION N/A 11/03/2017   Procedure: TRANSESOPHAGEAL ECHOCARDIOGRAM (TEE);  Surgeon: Maranda Leim DEL, MD;  Location: [facility] ENDOSCOPY;  Service: Cardiovascular;  Laterality: N/A;    MEDICATIONS:  Prior to Admission medications   Medication Sig Start Date End Date Taking? Authorizing Provider  aspirin  EC 81 MG EC tablet Take 1 tablet (81 mg total) by mouth daily. 11/05/17   Rizwan, Saima, MD  carvedilol  (COREG ) 12.5 MG tablet Take 1 tablet (12.5 mg total) by mouth 2 (two) times daily with a meal. 09/16/23   Waddell Danelle ORN, MD  colchicine 0.6 MG tablet Take 0.3 mg by mouth daily.    [provider]  CYANOCOBALAMIN  IJ Inject as directed every 30 (thirty) days. On or about the 26th of each month    [provider]  diltiazem  (CARDIZEM  CD) 120 MG 24 hr capsule TAKE 1 CAPSULE BY MOUTH EVERY DAY 12/02/23   Waddell Danelle ORN, MD  furosemide  (LASIX ) 40 MG tablet TAKE 2 TABLETS BY MOUTH EVERY DAY 03/31/24  Lee, [place], NP  glipiZIDE (GLUCOTROL XL) 2.5 MG 24 hr tablet Take 1 tablet by mouth daily. 09/17/23   [provider]  ibuprofen (ADVIL) 800 MG tablet Take 800 mg by mouth 3 (three) times daily. 10/20/23   [provider]  lidocaine  (XYLOCAINE ) 2 % solution Use as directed 15 mLs in the mouth or throat every 4 (four) hours as needed. 12/22/23   Teresa Shelba SAUNDERS, NP  losartan  (COZAAR ) 25 MG tablet TAKE 1/2 TABLET BY MOUTH DAILY 12/15/23   [place]ette [place] A, FNP  memantine  (NAMENDA  TITRATION PACK) tablet pack Take 1 tablet by mouth in the  morning and at bedtime. 04/17/22 04/17/23  [provider]  metFORMIN (GLUCOPHAGE-XR) 500 MG 24 hr tablet Take 1,000 mg by mouth 2 (two) times daily with a meal. 01/25/16   [provider]  mirtazapine (REMERON) 7.5 MG tablet Take 1 tablet by mouth at bedtime. 08/18/23   [provider]  pantoprazole  (PROTONIX ) 20 MG tablet Take 20 mg by mouth. 06/18/23   [provider]  potassium chloride  SA (KLOR-CON  M) 20 MEQ tablet TAKE 2 TABLETS BY MOUTH EVERY DAY 03/31/24   Lee, [place], NP  rosuvastatin  (CRESTOR ) 5 MG tablet Take 1 tablet (5 mg total) by mouth daily at 6 PM. 01/19/18   Whitfield Raisin, NP  traMADol  (ULTRAM ) 50 MG tablet Take 1 tablet (50 mg total) by mouth every 8 (eight) hours as needed for severe pain (pain score 7-10). 06/16/23   Dicky Anes, MD    Physical Exam   Triage Vital Signs: ED Triage Vitals [04/21/24 1757]  Encounter Vitals Group     BP (!) 130/51     Girls Systolic BP Percentile      Girls Diastolic BP Percentile      Boys Systolic BP Percentile      Boys Diastolic BP Percentile      Pulse Rate 67     Resp 18     Temp 98.1 F (36.7 C)     Temp Source Oral     SpO2 98 %     Weight      Height      Head Circumference      Peak Flow      Pain Score      Pain Loc      Pain Education      Exclude from Growth Chart     Most recent vital signs: Vitals:   04/21/24 1757 04/21/24 2022  BP: (!) 130/51 (!) 137/44  Pulse: 67 67  Resp: 18 16  Temp: 98.1 F (36.7 C) 98.4 F (36.9 C)  SpO2: 98% 95%    CONSTITUTIONAL: Alert, responds appropriately to questions.  Elderly, intermittently slightly confused but oriented x 4 HEAD: Normocephalic, atraumatic EYES: Conjunctivae clear, pupils appear equal, sclera nonicteric ENT: normal nose; moist mucous membranes NECK: Supple, normal ROM CARD: RRR; S1 and S2 appreciated RESP: Normal chest excursion without splinting or tachypnea; breath sounds clear and equal bilaterally; no wheezes, no  rhonchi, no rales, no hypoxia or respiratory distress, speaking full sentences ABD/GI: Non-distended; soft, mildly tender throughout the abdomen without guarding or rebound BACK: The back appears normal EXT: Normal ROM in all joints; no deformity noted, no edema, no calf tenderness or calf swelling SKIN: Normal color for age and race; warm; no rash on exposed skin NEURO: Moves all extremities equally, normal speech, no facial asymmetry, normal sensation diffusely PSYCH: The patient's mood and manner are appropriate.   ED Results /  Procedures / Treatments   LABS: (all labs ordered are listed, but only abnormal results are displayed) Labs Reviewed  COMPREHENSIVE METABOLIC PANEL WITH GFR - Abnormal; Notable for the following components:      Result Value   Glucose, Bld 127 (*)    BUN 31 (*)    Creatinine, Ser 1.86 (*)    GFR, Estimated 27 (*)    All other components within normal limits  CBC - Abnormal; Notable for the following components:   RBC 3.30 (*)    Hemoglobin 9.9 (*)    HCT 30.5 (*)    All other components within normal limits  URINALYSIS, ROUTINE W REFLEX MICROSCOPIC - Abnormal; Notable for the following components:   Color, Urine STRAW (*)    APPearance CLEAR (*)    Leukocytes,Ua SMALL (*)    All other components within normal limits  BRAIN NATRIURETIC PEPTIDE  TSH  LIPASE, BLOOD  TROPONIN I (HIGH SENSITIVITY)     EKG:  EKG Interpretation Date/Time:  Thursday April 21 2024 18:00:23 EDT Ventricular Rate:  67 PR Interval:  186 QRS Duration:  82 QT Interval:  372 QTC Calculation: 393 R Axis:   -1  Text Interpretation: Normal sinus rhythm Low voltage QRS Septal infarct , age undetermined Abnormal ECG When compared with ECG of 11-Jul-2023 01:47, PREVIOUS ECG IS PRESENT Confirmed by Neomi Neptune [phone]) on 04/21/2024 11:13:02 PM         RADIOLOGY: My personal review and interpretation of imaging:  ***  I have personally reviewed all radiology reports.    CT HEAD WO CONTRAST Result Date: 04/21/2024 CLINICAL DATA:  Altered mental status EXAM: CT HEAD WITHOUT CONTRAST TECHNIQUE: Contiguous axial images were obtained from the base of the skull through the vertex without intravenous contrast. RADIATION DOSE REDUCTION: This exam was performed according to the departmental dose-optimization program which includes automated exposure control, adjustment of the mA and/or kV according to patient size and/or use of iterative reconstruction technique. COMPARISON:  CT brain 07/11/2023, 06/16/2023 FINDINGS: Brain: No acute territorial infarction, hemorrhage or intracranial mass. Chronic left cerebellar infarct. Atrophy and chronic small vessel ischemic changes of the white matter. Stable ventricle size Vascular: No hyperdense vessels.  Carotid vascular calcification Skull: Normal. Negative for fracture or focal lesion. Sinuses/Orbits: No acute finding. Other: None IMPRESSION: 1. No CT evidence for acute intracranial abnormality. 2. Atrophy and chronic small vessel ischemic changes of the white matter. Chronic left cerebellar infarct. Electronically Signed   By: Luke Bun M.D.   On: 04/21/2024 18:38     PROCEDURES:  Critical Care performed: {CriticalCareYesNo:19197::Yes, see critical care procedure note(s),No}   CRITICAL CARE Performed by: Neptune Hadiyah Maricle   Total critical care time: *** minutes  Critical care time was exclusive of separately billable procedures and treating other patients.  Critical care was necessary to treat or prevent imminent or life-threatening deterioration.  Critical care was time spent personally by me on the following activities: development of treatment plan with patient and/or surrogate as well as nursing, discussions with consultants, evaluation of patient's response to treatment, examination of patient, obtaining history from patient or surrogate, ordering and performing treatments and interventions, ordering and review of  laboratory studies, ordering and review of radiographic studies, pulse oximetry and re-evaluation of patient's condition.   SABRA1-3 Lead EKG Interpretation  Performed by: Janny Crute, Neptune SAILOR, DO Authorized by: Rashaun Wichert, Neptune SAILOR, DO     Interpretation: normal     ECG rate:  67   ECG rate assessment: normal  Rhythm: sinus rhythm     Ectopy: none     Conduction: normal       IMPRESSION / MDM / ASSESSMENT AND PLAN / ED COURSE  I reviewed the triage vital signs and the nursing notes.    Patient here with altered mental status, hallucinations, complaints of chest pain earlier today and now generalized abdominal pain.  The patient is on the cardiac monitor to evaluate for evidence of arrhythmia and/or significant heart rate changes.   DIFFERENTIAL DIAGNOSIS (includes but not limited to):   Stroke, UTI, anemia, electrolyte derangement, metabolic encephalopathy, recrudescence of prior stroke symptoms, colitis, diverticulitis, appendicitis, bowel obstruction, ACS, CHF exacerbation, pneumonia, COVID encephalopathy   Patient's presentation is most consistent with acute presentation with potential threat to life or bodily function.   PLAN: Workup so far shows hemoglobin of 9.9.  Last was 11.0 on 07/11/2023.  She denies bloody stools, melena.  May be secondary to chronic kidney disease which appears stable.  Urine does not show any sign of infection.  CT head reviewed and interpreted by myself and the radiologist and is unremarkable.  EKG nonischemic but there is a lot of baseline wander.  Will repeat.  Will obtain additional labs, MRI of the brain, chest x-ray, CT of the abdomen pelvis.  Family does not feel she is yet back to her baseline but she is oriented x 4, neuro intact here.  We did discuss admission to the hospital however patient states she would prefer discharge home if possible.   MEDICATIONS GIVEN IN ED: Medications - No data to display   ED COURSE:  ***   CONSULTS:   ***   OUTSIDE RECORDS REVIEWED: Reviewed last endocrinology, cardiology and nephrology notes.       FINAL CLINICAL IMPRESSION(S) / ED DIAGNOSES   Final diagnoses:  Altered mental status, unspecified altered mental status type     Rx / DC Orders   ED Discharge Orders     None        Note:  This document was prepared using Dragon voice recognition software and may include unintentional dictation errors.

## 2024-04-22 ENCOUNTER — Emergency Department

## 2024-04-22 DIAGNOSIS — R079 Chest pain, unspecified: Secondary | ICD-10-CM | POA: Diagnosis not present

## 2024-04-22 DIAGNOSIS — I700 Atherosclerosis of aorta: Secondary | ICD-10-CM | POA: Diagnosis not present

## 2024-04-22 DIAGNOSIS — I517 Cardiomegaly: Secondary | ICD-10-CM | POA: Diagnosis not present

## 2024-04-22 DIAGNOSIS — R29818 Other symptoms and signs involving the nervous system: Secondary | ICD-10-CM | POA: Diagnosis not present

## 2024-04-22 LAB — TSH: TSH: 1.552 u[IU]/mL (ref 0.350–4.500)

## 2024-04-22 LAB — BRAIN NATRIURETIC PEPTIDE: B Natriuretic Peptide: 62.9 pg/mL (ref 0.0–100.0)

## 2024-04-22 LAB — LIPASE, BLOOD: Lipase: 50 U/L (ref 11–51)

## 2024-04-22 LAB — TROPONIN I (HIGH SENSITIVITY): Troponin I (High Sensitivity): 4 ng/L (ref <18)

## 2024-04-22 NOTE — Discharge Instructions (Addendum)
 You were seen in the emergency department for altered mental status, hallucinations.  Your workup has been unrevealing.  MRI showed no new stroke.  Urine showed no infection.  Chest x-ray showed no pneumonia, heart failure exacerbation.  No sign of heart attack.  Your lab work has been grossly unrevealing as well.  We have offered admission to the hospital which you have declined.  I recommend close follow-up with your primary care doctor.  If confusion returns or you have any new symptoms including headache, numbness, tingling, focal weakness, chest pain or shortness of breath, vomiting, blood in your stool, urinary symptoms, please return to the emergency department.

## 2024-04-25 DIAGNOSIS — I5032 Chronic diastolic (congestive) heart failure: Secondary | ICD-10-CM | POA: Diagnosis not present

## 2024-04-25 DIAGNOSIS — F03A18 Unspecified dementia, mild, with other behavioral disturbance: Secondary | ICD-10-CM | POA: Diagnosis not present

## 2024-04-25 DIAGNOSIS — M1A9XX1 Chronic gout, unspecified, with tophus (tophi): Secondary | ICD-10-CM | POA: Diagnosis not present

## 2024-04-25 DIAGNOSIS — R4189 Other symptoms and signs involving cognitive functions and awareness: Secondary | ICD-10-CM | POA: Diagnosis not present

## 2024-04-25 DIAGNOSIS — R443 Hallucinations, unspecified: Secondary | ICD-10-CM | POA: Diagnosis not present

## 2024-04-25 DIAGNOSIS — I10 Essential (primary) hypertension: Secondary | ICD-10-CM | POA: Diagnosis not present

## 2024-04-25 DIAGNOSIS — I472 Ventricular tachycardia, unspecified: Secondary | ICD-10-CM | POA: Diagnosis not present

## 2024-04-25 DIAGNOSIS — R4182 Altered mental status, unspecified: Secondary | ICD-10-CM | POA: Diagnosis not present

## 2024-05-03 DIAGNOSIS — N1832 Chronic kidney disease, stage 3b: Secondary | ICD-10-CM | POA: Diagnosis not present

## 2024-05-03 DIAGNOSIS — Z79899 Other long term (current) drug therapy: Secondary | ICD-10-CM | POA: Diagnosis not present

## 2024-05-03 DIAGNOSIS — M1A09X1 Idiopathic chronic gout, multiple sites, with tophus (tophi): Secondary | ICD-10-CM | POA: Diagnosis not present

## 2024-05-09 DIAGNOSIS — Z1331 Encounter for screening for depression: Secondary | ICD-10-CM | POA: Diagnosis not present

## 2024-05-09 DIAGNOSIS — R42 Dizziness and giddiness: Secondary | ICD-10-CM | POA: Diagnosis not present

## 2024-05-09 DIAGNOSIS — G3184 Mild cognitive impairment, so stated: Secondary | ICD-10-CM | POA: Diagnosis not present

## 2024-05-25 DIAGNOSIS — L03012 Cellulitis of left finger: Secondary | ICD-10-CM | POA: Diagnosis not present

## 2024-05-25 DIAGNOSIS — M1A9XX1 Chronic gout, unspecified, with tophus (tophi): Secondary | ICD-10-CM | POA: Diagnosis not present

## 2024-05-25 DIAGNOSIS — N184 Chronic kidney disease, stage 4 (severe): Secondary | ICD-10-CM | POA: Diagnosis not present

## 2024-05-26 ENCOUNTER — Inpatient Hospital Stay (HOSPITAL_COMMUNITY)
Admission: EM | Admit: 2024-05-26 | Discharge: 2024-06-08 | DRG: 987 | Disposition: A | Discharge status: Skilled Nursing Facility | Attending: Internal Medicine | Admitting: Internal Medicine

## 2024-05-26 ENCOUNTER — Emergency Department (HOSPITAL_COMMUNITY)

## 2024-05-26 ENCOUNTER — Other Ambulatory Visit: Payer: Self-pay

## 2024-05-26 ENCOUNTER — Encounter (HOSPITAL_COMMUNITY): Payer: Self-pay

## 2024-05-26 DIAGNOSIS — L98A329 Non-pressure chronic ulcer of left hand with unspecified severity: Secondary | ICD-10-CM | POA: Diagnosis present

## 2024-05-26 DIAGNOSIS — N1832 Chronic kidney disease, stage 3b: Secondary | ICD-10-CM | POA: Diagnosis present

## 2024-05-26 DIAGNOSIS — E875 Hyperkalemia: Secondary | ICD-10-CM | POA: Diagnosis not present

## 2024-05-26 DIAGNOSIS — E11622 Type 2 diabetes mellitus with other skin ulcer: Secondary | ICD-10-CM | POA: Diagnosis present

## 2024-05-26 DIAGNOSIS — Z880 Allergy status to penicillin: Secondary | ICD-10-CM

## 2024-05-26 DIAGNOSIS — R410 Disorientation, unspecified: Secondary | ICD-10-CM | POA: Diagnosis not present

## 2024-05-26 DIAGNOSIS — E1122 Type 2 diabetes mellitus with diabetic chronic kidney disease: Secondary | ICD-10-CM | POA: Diagnosis present

## 2024-05-26 DIAGNOSIS — I10 Essential (primary) hypertension: Secondary | ICD-10-CM | POA: Diagnosis not present

## 2024-05-26 DIAGNOSIS — I517 Cardiomegaly: Secondary | ICD-10-CM | POA: Diagnosis not present

## 2024-05-26 DIAGNOSIS — I110 Hypertensive heart disease with heart failure: Secondary | ICD-10-CM | POA: Diagnosis not present

## 2024-05-26 DIAGNOSIS — N189 Chronic kidney disease, unspecified: Secondary | ICD-10-CM | POA: Diagnosis not present

## 2024-05-26 DIAGNOSIS — R918 Other nonspecific abnormal finding of lung field: Secondary | ICD-10-CM | POA: Diagnosis not present

## 2024-05-26 DIAGNOSIS — F0392 Unspecified dementia, unspecified severity, with psychotic disturbance: Secondary | ICD-10-CM | POA: Diagnosis present

## 2024-05-26 DIAGNOSIS — I130 Hypertensive heart and chronic kidney disease with heart failure and stage 1 through stage 4 chronic kidney disease, or unspecified chronic kidney disease: Secondary | ICD-10-CM | POA: Diagnosis present

## 2024-05-26 DIAGNOSIS — N179 Acute kidney failure, unspecified: Principal | ICD-10-CM | POA: Diagnosis present

## 2024-05-26 DIAGNOSIS — S0990XA Unspecified injury of head, initial encounter: Secondary | ICD-10-CM | POA: Diagnosis not present

## 2024-05-26 DIAGNOSIS — W19XXXA Unspecified fall, initial encounter: Secondary | ICD-10-CM | POA: Diagnosis not present

## 2024-05-26 DIAGNOSIS — E11649 Type 2 diabetes mellitus with hypoglycemia without coma: Secondary | ICD-10-CM | POA: Diagnosis not present

## 2024-05-26 DIAGNOSIS — I5032 Chronic diastolic (congestive) heart failure: Secondary | ICD-10-CM | POA: Diagnosis present

## 2024-05-26 DIAGNOSIS — S62631A Displaced fracture of distal phalanx of left index finger, initial encounter for closed fracture: Secondary | ICD-10-CM | POA: Diagnosis not present

## 2024-05-26 DIAGNOSIS — Z751 Person awaiting admission to adequate facility elsewhere: Secondary | ICD-10-CM

## 2024-05-26 DIAGNOSIS — Z794 Long term (current) use of insulin: Secondary | ICD-10-CM | POA: Diagnosis not present

## 2024-05-26 DIAGNOSIS — M109 Gout, unspecified: Secondary | ICD-10-CM | POA: Diagnosis not present

## 2024-05-26 DIAGNOSIS — M7989 Other specified soft tissue disorders: Secondary | ICD-10-CM | POA: Diagnosis not present

## 2024-05-26 DIAGNOSIS — J479 Bronchiectasis, uncomplicated: Secondary | ICD-10-CM | POA: Diagnosis present

## 2024-05-26 DIAGNOSIS — L03012 Cellulitis of left finger: Secondary | ICD-10-CM | POA: Diagnosis not present

## 2024-05-26 DIAGNOSIS — M47816 Spondylosis without myelopathy or radiculopathy, lumbar region: Secondary | ICD-10-CM | POA: Diagnosis not present

## 2024-05-26 DIAGNOSIS — Z66 Do not resuscitate: Secondary | ICD-10-CM | POA: Diagnosis present

## 2024-05-26 DIAGNOSIS — M4802 Spinal stenosis, cervical region: Secondary | ICD-10-CM | POA: Diagnosis not present

## 2024-05-26 DIAGNOSIS — E1165 Type 2 diabetes mellitus with hyperglycemia: Secondary | ICD-10-CM | POA: Diagnosis not present

## 2024-05-26 DIAGNOSIS — Z79899 Other long term (current) drug therapy: Secondary | ICD-10-CM

## 2024-05-26 DIAGNOSIS — D631 Anemia in chronic kidney disease: Secondary | ICD-10-CM | POA: Diagnosis present

## 2024-05-26 DIAGNOSIS — M1A0421 Idiopathic chronic gout, left hand, with tophus (tophi): Secondary | ICD-10-CM | POA: Diagnosis not present

## 2024-05-26 DIAGNOSIS — E8721 Acute metabolic acidosis: Secondary | ICD-10-CM | POA: Diagnosis present

## 2024-05-26 DIAGNOSIS — Z91018 Allergy to other foods: Secondary | ICD-10-CM

## 2024-05-26 DIAGNOSIS — I6501 Occlusion and stenosis of right vertebral artery: Secondary | ICD-10-CM | POA: Diagnosis not present

## 2024-05-26 DIAGNOSIS — L089 Local infection of the skin and subcutaneous tissue, unspecified: Secondary | ICD-10-CM | POA: Diagnosis present

## 2024-05-26 DIAGNOSIS — E1152 Type 2 diabetes mellitus with diabetic peripheral angiopathy with gangrene: Secondary | ICD-10-CM | POA: Diagnosis present

## 2024-05-26 DIAGNOSIS — Z7982 Long term (current) use of aspirin: Secondary | ICD-10-CM

## 2024-05-26 DIAGNOSIS — I252 Old myocardial infarction: Secondary | ICD-10-CM

## 2024-05-26 DIAGNOSIS — M1A9XX1 Chronic gout, unspecified, with tophus (tophi): Secondary | ICD-10-CM | POA: Diagnosis present

## 2024-05-26 DIAGNOSIS — E1169 Type 2 diabetes mellitus with other specified complication: Principal | ICD-10-CM | POA: Diagnosis present

## 2024-05-26 DIAGNOSIS — M47812 Spondylosis without myelopathy or radiculopathy, cervical region: Secondary | ICD-10-CM | POA: Diagnosis not present

## 2024-05-26 DIAGNOSIS — S61201A Unspecified open wound of left index finger without damage to nail, initial encounter: Secondary | ICD-10-CM | POA: Diagnosis not present

## 2024-05-26 DIAGNOSIS — Z7984 Long term (current) use of oral hypoglycemic drugs: Secondary | ICD-10-CM

## 2024-05-26 DIAGNOSIS — R197 Diarrhea, unspecified: Secondary | ICD-10-CM | POA: Diagnosis not present

## 2024-05-26 DIAGNOSIS — D649 Anemia, unspecified: Secondary | ICD-10-CM | POA: Diagnosis not present

## 2024-05-26 DIAGNOSIS — M65149 Other infective (teno)synovitis, unspecified hand: Secondary | ICD-10-CM | POA: Diagnosis not present

## 2024-05-26 DIAGNOSIS — E872 Acidosis, unspecified: Secondary | ICD-10-CM | POA: Diagnosis not present

## 2024-05-26 DIAGNOSIS — I700 Atherosclerosis of aorta: Secondary | ICD-10-CM | POA: Diagnosis not present

## 2024-05-26 DIAGNOSIS — Z87891 Personal history of nicotine dependence: Secondary | ICD-10-CM

## 2024-05-26 DIAGNOSIS — I2510 Atherosclerotic heart disease of native coronary artery without angina pectoris: Secondary | ICD-10-CM | POA: Diagnosis present

## 2024-05-26 DIAGNOSIS — E785 Hyperlipidemia, unspecified: Secondary | ICD-10-CM | POA: Diagnosis present

## 2024-05-26 DIAGNOSIS — F0390 Unspecified dementia without behavioral disturbance: Secondary | ICD-10-CM | POA: Diagnosis present

## 2024-05-26 DIAGNOSIS — B9561 Methicillin susceptible Staphylococcus aureus infection as the cause of diseases classified elsewhere: Secondary | ICD-10-CM | POA: Diagnosis not present

## 2024-05-26 DIAGNOSIS — I639 Cerebral infarction, unspecified: Secondary | ICD-10-CM

## 2024-05-26 DIAGNOSIS — Z8249 Family history of ischemic heart disease and other diseases of the circulatory system: Secondary | ICD-10-CM

## 2024-05-26 DIAGNOSIS — L02612 Cutaneous abscess of left foot: Secondary | ICD-10-CM | POA: Diagnosis not present

## 2024-05-26 DIAGNOSIS — Z515 Encounter for palliative care: Secondary | ICD-10-CM

## 2024-05-26 DIAGNOSIS — I5033 Acute on chronic diastolic (congestive) heart failure: Secondary | ICD-10-CM | POA: Diagnosis not present

## 2024-05-26 DIAGNOSIS — Z8673 Personal history of transient ischemic attack (TIA), and cerebral infarction without residual deficits: Secondary | ICD-10-CM

## 2024-05-26 DIAGNOSIS — I6523 Occlusion and stenosis of bilateral carotid arteries: Secondary | ICD-10-CM | POA: Diagnosis not present

## 2024-05-26 DIAGNOSIS — Z7189 Other specified counseling: Secondary | ICD-10-CM | POA: Diagnosis not present

## 2024-05-26 DIAGNOSIS — G9341 Metabolic encephalopathy: Secondary | ICD-10-CM | POA: Diagnosis present

## 2024-05-26 DIAGNOSIS — M869 Osteomyelitis, unspecified: Secondary | ICD-10-CM | POA: Diagnosis present

## 2024-05-26 DIAGNOSIS — M86142 Other acute osteomyelitis, left hand: Secondary | ICD-10-CM | POA: Diagnosis present

## 2024-05-26 DIAGNOSIS — Z882 Allergy status to sulfonamides status: Secondary | ICD-10-CM

## 2024-05-26 DIAGNOSIS — K219 Gastro-esophageal reflux disease without esophagitis: Secondary | ICD-10-CM | POA: Diagnosis present

## 2024-05-26 DIAGNOSIS — E871 Hypo-osmolality and hyponatremia: Secondary | ICD-10-CM | POA: Diagnosis present

## 2024-05-26 DIAGNOSIS — I6782 Cerebral ischemia: Secondary | ICD-10-CM | POA: Diagnosis not present

## 2024-05-26 DIAGNOSIS — R441 Visual hallucinations: Secondary | ICD-10-CM | POA: Diagnosis not present

## 2024-05-26 DIAGNOSIS — Z888 Allergy status to other drugs, medicaments and biological substances status: Secondary | ICD-10-CM

## 2024-05-26 DIAGNOSIS — S68621A Partial traumatic transphalangeal amputation of left index finger, initial encounter: Secondary | ICD-10-CM | POA: Diagnosis not present

## 2024-05-26 DIAGNOSIS — M65142 Other infective (teno)synovitis, left hand: Secondary | ICD-10-CM | POA: Diagnosis present

## 2024-05-26 DIAGNOSIS — F05 Delirium due to known physiological condition: Secondary | ICD-10-CM | POA: Diagnosis not present

## 2024-05-26 DIAGNOSIS — I959 Hypotension, unspecified: Secondary | ICD-10-CM | POA: Diagnosis not present

## 2024-05-26 DIAGNOSIS — M1710 Unilateral primary osteoarthritis, unspecified knee: Secondary | ICD-10-CM | POA: Diagnosis present

## 2024-05-26 DIAGNOSIS — S199XXA Unspecified injury of neck, initial encounter: Secondary | ICD-10-CM | POA: Diagnosis not present

## 2024-05-26 DIAGNOSIS — M50322 Other cervical disc degeneration at C5-C6 level: Secondary | ICD-10-CM | POA: Diagnosis not present

## 2024-05-26 HISTORY — DX: Unspecified dementia, unspecified severity, without behavioral disturbance, psychotic disturbance, mood disturbance, and anxiety: F03.90

## 2024-05-26 LAB — COMPREHENSIVE METABOLIC PANEL WITH GFR
ALT: 17 U/L (ref 0–44)
AST: 30 U/L (ref 15–41)
Albumin: 3.4 g/dL — ABNORMAL LOW (ref 3.5–5.0)
Alkaline Phosphatase: 98 U/L (ref 38–126)
Anion gap: 18 — ABNORMAL HIGH (ref 5–15)
BUN: 72 mg/dL — ABNORMAL HIGH (ref 8–23)
CO2: 16 mmol/L — ABNORMAL LOW (ref 22–32)
Calcium: 8.9 mg/dL (ref 8.9–10.3)
Chloride: 100 mmol/L (ref 98–111)
Creatinine, Ser: 6.36 mg/dL — ABNORMAL HIGH (ref 0.44–1.00)
GFR, Estimated: 6 mL/min — ABNORMAL LOW (ref >60)
Glucose, Bld: 70 mg/dL (ref 70–99)
Potassium: 5.6 mmol/L — ABNORMAL HIGH (ref 3.5–5.1)
Sodium: 134 mmol/L — ABNORMAL LOW (ref 135–145)
Total Bilirubin: 0.8 mg/dL (ref 0.0–1.2)
Total Protein: 6.7 g/dL (ref 6.5–8.1)

## 2024-05-26 LAB — CBC WITH DIFFERENTIAL/PLATELET
Abs Immature Granulocytes: 0.04 K/uL (ref 0.00–0.07)
Basophils Absolute: 0.1 K/uL (ref 0.0–0.1)
Basophils Relative: 1 %
Eosinophils Absolute: 0.1 K/uL (ref 0.0–0.5)
Eosinophils Relative: 1 %
HCT: 32 % — ABNORMAL LOW (ref 36.0–46.0)
Hemoglobin: 10.2 g/dL — ABNORMAL LOW (ref 12.0–15.0)
Immature Granulocytes: 1 %
Lymphocytes Relative: 17 %
Lymphs Abs: 1.4 K/uL (ref 0.7–4.0)
MCH: 29.7 pg (ref 26.0–34.0)
MCHC: 31.9 g/dL (ref 30.0–36.0)
MCV: 93 fL (ref 80.0–100.0)
Monocytes Absolute: 0.8 K/uL (ref 0.1–1.0)
Monocytes Relative: 10 %
Neutro Abs: 5.8 K/uL (ref 1.7–7.7)
Neutrophils Relative %: 70 %
Platelets: 226 K/uL (ref 150–400)
RBC: 3.44 MIL/uL — ABNORMAL LOW (ref 3.87–5.11)
RDW: 15.6 % — ABNORMAL HIGH (ref 11.5–15.5)
WBC: 8.1 K/uL (ref 4.0–10.5)
nRBC: 0 % (ref 0.0–0.2)

## 2024-05-26 LAB — I-STAT CG4 LACTIC ACID, ED
Lactic Acid, Venous: 1.6 mmol/L (ref 0.5–1.9)
Lactic Acid, Venous: 2.8 mmol/L (ref 0.5–1.9)

## 2024-05-26 LAB — CK: Total CK: 162 U/L (ref 38–234)

## 2024-05-26 MED ORDER — SODIUM CHLORIDE 0.9 % IV BOLUS
1000.0000 mL | Freq: Once | INTRAVENOUS | Status: AC
  Administered 2024-05-26: 1000 mL via INTRAVENOUS

## 2024-05-26 MED ORDER — SODIUM CHLORIDE 0.9 % IV SOLN
3.0000 g | Freq: Once | INTRAVENOUS | Status: AC
  Administered 2024-05-27: 3 g via INTRAVENOUS
  Filled 2024-05-26: qty 8

## 2024-05-26 NOTE — ED Triage Notes (Signed)
 Pt bibgcems for fall, ems stated no injuries and did not hit her head. EMS noted  swollen index finger prescribed with doxycycline 100mg  for 7 days. Family noted pt slightly confused tonight. Pt alert and oriented with ems.  Bp 108/58 Hr 63 Cbg 89 97% ra

## 2024-05-26 NOTE — ED Provider Notes (Signed)
 [place] EMERGENCY DEPARTMENT AT [facility] Provider Note   CSN: [id number] Arrival date & time: 05/26/24  2117     Patient presents with: Wound Infection (Finger infection)   Yvonne Newton is a 83 y.o. female.   Patient has a history of dementia, cerebellar stroke and was brought in for fall.  Patient is accompanied by his son who does not live with her and was not present during fall but assisted in history.  Called daughter that lives with patient and who provided more history.  Daughter mentioned that patient slept all day today which was unlike her.  She stood up from the bed and had the fall about 2 hours ago.  She lost consciousness for about 45 minutes.  Patient hit her head during the fall.  Daughter mentions that patient is not on any blood thinners.  Son mentioned that patient was talking about children being around her and stealing her belongings--something that she has not mentioned before. Son and daughter mentioned that patient's dementia has been progressing.  Patient was answering questions and following commands on physical exam.  Son mentioned that patient was back to her baseline mentation during my evaluation.  Patient had swollen fingers on both hands.  Son was concerned about left index finger that looked infected.  Son mentioned that patient was taken to UC yesterday and was prescribed antibiotics for swollen left index finger.  Daughter mentioned that patient had not been eating well for the past few days.  Daughter mentioned that patient denied any fevers, chills recently.  Patient denied any shortness of breath, chest pain.  He endorsed pain at the base of her neck on the left anterior side.        Prior to Admission medications   Medication Sig Start Date End Date Taking? Authorizing Provider  aspirin  EC 81 MG EC tablet Take 1 tablet (81 mg total) by mouth daily. 11/05/17   Rizwan, Saima, MD  carvedilol  (COREG ) 12.5 MG tablet Take 1 tablet (12.5  mg total) by mouth 2 (two) times daily with a meal. 09/16/23   Waddell Danelle ORN, MD  colchicine  0.6 MG tablet Take 0.3 mg by mouth daily.    [provider]  CYANOCOBALAMIN  IJ Inject as directed every 30 (thirty) days. On or about the 26th of each month    [provider]  diltiazem  (CARDIZEM  CD) 120 MG 24 hr capsule TAKE 1 CAPSULE BY MOUTH EVERY DAY 12/02/23   Waddell Danelle ORN, MD  furosemide  (LASIX ) 40 MG tablet TAKE 2 TABLETS BY MOUTH EVERY DAY 03/31/24   Lee, [place], NP  glipiZIDE (GLUCOTROL XL) 2.5 MG 24 hr tablet Take 1 tablet by mouth daily. 09/17/23   [provider]  ibuprofen (ADVIL) 800 MG tablet Take 800 mg by mouth 3 (three) times daily. 10/20/23   [provider]  lidocaine  (XYLOCAINE ) 2 % solution Use as directed 15 mLs in the mouth or throat every 4 (four) hours as needed. 12/22/23   Teresa Shelba SAUNDERS, NP  losartan  (COZAAR ) 25 MG tablet TAKE 1/2 TABLET BY MOUTH DAILY 12/15/23   [place] A, FNP  memantine  (NAMENDA  TITRATION PACK) tablet pack Take 1 tablet by mouth in the morning and at bedtime. 04/17/22 04/17/23  [provider]  metFORMIN (GLUCOPHAGE-XR) 500 MG 24 hr tablet Take 1,000 mg by mouth 2 (two) times daily with a meal. 01/25/16   [provider]  mirtazapine (REMERON) 7.5 MG tablet Take 1 tablet by mouth at  bedtime. 08/18/23   [provider]  pantoprazole  (PROTONIX ) 20 MG tablet Take 20 mg by mouth. 06/18/23   [provider]  potassium chloride  SA (KLOR-CON  M) 20 MEQ tablet TAKE 2 TABLETS BY MOUTH EVERY DAY 03/31/24   Lee, [place], NP  rosuvastatin  (CRESTOR ) 5 MG tablet Take 1 tablet (5 mg total) by mouth daily at 6 PM. 01/19/18   Whitfield Raisin, NP  traMADol  (ULTRAM ) 50 MG tablet Take 1 tablet (50 mg total) by mouth every 8 (eight) hours as needed for severe pain (pain score 7-10). 06/16/23   Dicky Anes, MD    Allergies: Blueberry flavoring agent (non-screening), Influenza vaccines, Sulfa antibiotics,  Sulfonamide derivatives, and Other    Review of Systems  Reason unable to perform ROS: Pertinent ROS in HPI, MDM.    Updated Vital Signs BP (!) 103/48   Pulse 64   Temp 97.7 F (36.5 C) (Oral)   Ht 5' 6 (1.676 m)   Wt 70.8 kg   SpO2 100%   BMI 25.18 kg/m   Physical Exam HENT:     Head: Normocephalic.  Eyes:     Extraocular Movements: Extraocular movements intact.  Neck:     Comments: Tenderness at the base of the neck on the left anterior side Cardiovascular:     Rate and Rhythm: Normal rate and regular rhythm.  Pulmonary:     Effort: Pulmonary effort is normal.     Breath sounds: Normal breath sounds.  Abdominal:     Palpations: Abdomen is soft.     Tenderness: There is no guarding or rebound.     Comments: Mild tenderness in the right upper quadrant  Musculoskeletal:     Cervical back: Normal range of motion.     Comments: Swelling observed in multiple DIP joints in right hand with tophi but no redness; left second DIP joint swollen with indurations and mild pus discharge  Skin:    General: Skin is warm.  Neurological:     General: No focal deficit present.     Comments: A&Ox2--self, place but not year.  Preserved strength and sensation in BL UE and LE.  Negative pronator drift.      (all labs ordered are listed, but only abnormal results are displayed) Labs Reviewed  CBC WITH DIFFERENTIAL/PLATELET - Abnormal; Notable for the following components:      Result Value   RBC 3.44 (*)    Hemoglobin 10.2 (*)    HCT 32.0 (*)    RDW 15.6 (*)    All other components within normal limits  I-STAT CG4 LACTIC ACID, ED - Abnormal; Notable for the following components:   Lactic Acid, Venous 2.8 (*)    All other components within normal limits  COMPREHENSIVE METABOLIC PANEL WITH GFR  URINALYSIS, W/ REFLEX TO CULTURE (INFECTION SUSPECTED)    EKG: None  Radiology: No results found.   Procedures  None Medications Ordered in the ED - No data to display                                  Medical Decision Making Patient came in due to fall with worsening of mentation.  Differential diagnosis includes dehydration, electrolyte abnormality, possible TBI, stroke.  Patient had significantly elevated BUN and creatinine levels. Suspect AKI to be the cause of her symptoms.  Started her on fluids.  Left index finger infection to be managed with I&D.   Disposition: Admission  for further workup of AKI  Amount and/or Complexity of Data Reviewed Labs: ordered.    Details: CBC, CMP, LA, UA Radiology: ordered.    Details: CT head, C-spine; x-ray of chest, pelvis, left hand       Final diagnoses:  None    ED Discharge Orders     None          Edgardo Pontiff, DO 05/26/24 2302    Pamella Ozell LABOR, DO 05/27/24 0134

## 2024-05-26 NOTE — ED Notes (Signed)
 Patient transported to CT

## 2024-05-27 ENCOUNTER — Emergency Department (HOSPITAL_COMMUNITY)

## 2024-05-27 ENCOUNTER — Inpatient Hospital Stay (HOSPITAL_COMMUNITY)

## 2024-05-27 DIAGNOSIS — K219 Gastro-esophageal reflux disease without esophagitis: Secondary | ICD-10-CM | POA: Diagnosis present

## 2024-05-27 DIAGNOSIS — E1152 Type 2 diabetes mellitus with diabetic peripheral angiopathy with gangrene: Secondary | ICD-10-CM | POA: Diagnosis not present

## 2024-05-27 DIAGNOSIS — M65142 Other infective (teno)synovitis, left hand: Secondary | ICD-10-CM | POA: Diagnosis not present

## 2024-05-27 DIAGNOSIS — Z7401 Bed confinement status: Secondary | ICD-10-CM | POA: Diagnosis not present

## 2024-05-27 DIAGNOSIS — L089 Local infection of the skin and subcutaneous tissue, unspecified: Secondary | ICD-10-CM | POA: Diagnosis present

## 2024-05-27 DIAGNOSIS — Z7189 Other specified counseling: Secondary | ICD-10-CM | POA: Diagnosis not present

## 2024-05-27 DIAGNOSIS — W19XXXA Unspecified fall, initial encounter: Secondary | ICD-10-CM

## 2024-05-27 DIAGNOSIS — E1122 Type 2 diabetes mellitus with diabetic chronic kidney disease: Secondary | ICD-10-CM | POA: Diagnosis not present

## 2024-05-27 DIAGNOSIS — D649 Anemia, unspecified: Secondary | ICD-10-CM | POA: Diagnosis present

## 2024-05-27 DIAGNOSIS — M1A0421 Idiopathic chronic gout, left hand, with tophus (tophi): Secondary | ICD-10-CM | POA: Diagnosis not present

## 2024-05-27 DIAGNOSIS — R410 Disorientation, unspecified: Secondary | ICD-10-CM | POA: Diagnosis not present

## 2024-05-27 DIAGNOSIS — M109 Gout, unspecified: Secondary | ICD-10-CM | POA: Diagnosis present

## 2024-05-27 DIAGNOSIS — I517 Cardiomegaly: Secondary | ICD-10-CM | POA: Diagnosis not present

## 2024-05-27 DIAGNOSIS — E11649 Type 2 diabetes mellitus with hypoglycemia without coma: Secondary | ICD-10-CM | POA: Diagnosis not present

## 2024-05-27 DIAGNOSIS — Z66 Do not resuscitate: Secondary | ICD-10-CM | POA: Diagnosis not present

## 2024-05-27 DIAGNOSIS — E871 Hypo-osmolality and hyponatremia: Secondary | ICD-10-CM | POA: Diagnosis not present

## 2024-05-27 DIAGNOSIS — S199XXA Unspecified injury of neck, initial encounter: Secondary | ICD-10-CM | POA: Diagnosis not present

## 2024-05-27 DIAGNOSIS — M6281 Muscle weakness (generalized): Secondary | ICD-10-CM | POA: Diagnosis not present

## 2024-05-27 DIAGNOSIS — E1165 Type 2 diabetes mellitus with hyperglycemia: Secondary | ICD-10-CM | POA: Diagnosis not present

## 2024-05-27 DIAGNOSIS — I700 Atherosclerosis of aorta: Secondary | ICD-10-CM | POA: Diagnosis not present

## 2024-05-27 DIAGNOSIS — Z515 Encounter for palliative care: Secondary | ICD-10-CM | POA: Diagnosis not present

## 2024-05-27 DIAGNOSIS — E11622 Type 2 diabetes mellitus with other skin ulcer: Secondary | ICD-10-CM | POA: Diagnosis not present

## 2024-05-27 DIAGNOSIS — M47812 Spondylosis without myelopathy or radiculopathy, cervical region: Secondary | ICD-10-CM | POA: Diagnosis not present

## 2024-05-27 DIAGNOSIS — I6782 Cerebral ischemia: Secondary | ICD-10-CM | POA: Diagnosis not present

## 2024-05-27 DIAGNOSIS — E8721 Acute metabolic acidosis: Secondary | ICD-10-CM | POA: Diagnosis not present

## 2024-05-27 DIAGNOSIS — E875 Hyperkalemia: Secondary | ICD-10-CM | POA: Diagnosis present

## 2024-05-27 DIAGNOSIS — F05 Delirium due to known physiological condition: Secondary | ICD-10-CM | POA: Diagnosis not present

## 2024-05-27 DIAGNOSIS — I110 Hypertensive heart disease with heart failure: Secondary | ICD-10-CM | POA: Diagnosis not present

## 2024-05-27 DIAGNOSIS — I10 Essential (primary) hypertension: Secondary | ICD-10-CM

## 2024-05-27 DIAGNOSIS — M50322 Other cervical disc degeneration at C5-C6 level: Secondary | ICD-10-CM | POA: Diagnosis not present

## 2024-05-27 DIAGNOSIS — E162 Hypoglycemia, unspecified: Secondary | ICD-10-CM | POA: Diagnosis not present

## 2024-05-27 DIAGNOSIS — F0392 Unspecified dementia, unspecified severity, with psychotic disturbance: Secondary | ICD-10-CM | POA: Diagnosis not present

## 2024-05-27 DIAGNOSIS — I5033 Acute on chronic diastolic (congestive) heart failure: Secondary | ICD-10-CM | POA: Diagnosis not present

## 2024-05-27 DIAGNOSIS — J479 Bronchiectasis, uncomplicated: Secondary | ICD-10-CM | POA: Diagnosis not present

## 2024-05-27 DIAGNOSIS — M65149 Other infective (teno)synovitis, unspecified hand: Secondary | ICD-10-CM | POA: Diagnosis not present

## 2024-05-27 DIAGNOSIS — E1169 Type 2 diabetes mellitus with other specified complication: Secondary | ICD-10-CM | POA: Diagnosis not present

## 2024-05-27 DIAGNOSIS — I5032 Chronic diastolic (congestive) heart failure: Secondary | ICD-10-CM | POA: Diagnosis not present

## 2024-05-27 DIAGNOSIS — I130 Hypertensive heart and chronic kidney disease with heart failure and stage 1 through stage 4 chronic kidney disease, or unspecified chronic kidney disease: Secondary | ICD-10-CM | POA: Diagnosis not present

## 2024-05-27 DIAGNOSIS — L02612 Cutaneous abscess of left foot: Secondary | ICD-10-CM | POA: Diagnosis not present

## 2024-05-27 DIAGNOSIS — B9561 Methicillin susceptible Staphylococcus aureus infection as the cause of diseases classified elsewhere: Secondary | ICD-10-CM | POA: Diagnosis not present

## 2024-05-27 DIAGNOSIS — S0990XA Unspecified injury of head, initial encounter: Secondary | ICD-10-CM | POA: Diagnosis not present

## 2024-05-27 DIAGNOSIS — I6523 Occlusion and stenosis of bilateral carotid arteries: Secondary | ICD-10-CM | POA: Diagnosis not present

## 2024-05-27 DIAGNOSIS — R918 Other nonspecific abnormal finding of lung field: Secondary | ICD-10-CM | POA: Diagnosis not present

## 2024-05-27 DIAGNOSIS — R2681 Unsteadiness on feet: Secondary | ICD-10-CM | POA: Diagnosis not present

## 2024-05-27 DIAGNOSIS — N189 Chronic kidney disease, unspecified: Secondary | ICD-10-CM | POA: Diagnosis not present

## 2024-05-27 DIAGNOSIS — Z794 Long term (current) use of insulin: Secondary | ICD-10-CM | POA: Diagnosis not present

## 2024-05-27 DIAGNOSIS — N179 Acute kidney failure, unspecified: Secondary | ICD-10-CM | POA: Diagnosis not present

## 2024-05-27 DIAGNOSIS — D631 Anemia in chronic kidney disease: Secondary | ICD-10-CM | POA: Diagnosis not present

## 2024-05-27 DIAGNOSIS — I6501 Occlusion and stenosis of right vertebral artery: Secondary | ICD-10-CM | POA: Diagnosis not present

## 2024-05-27 DIAGNOSIS — M1A9XX1 Chronic gout, unspecified, with tophus (tophi): Secondary | ICD-10-CM | POA: Diagnosis not present

## 2024-05-27 DIAGNOSIS — M869 Osteomyelitis, unspecified: Secondary | ICD-10-CM | POA: Diagnosis not present

## 2024-05-27 DIAGNOSIS — G9341 Metabolic encephalopathy: Secondary | ICD-10-CM | POA: Diagnosis present

## 2024-05-27 DIAGNOSIS — F0390 Unspecified dementia without behavioral disturbance: Secondary | ICD-10-CM | POA: Diagnosis not present

## 2024-05-27 DIAGNOSIS — E785 Hyperlipidemia, unspecified: Secondary | ICD-10-CM

## 2024-05-27 DIAGNOSIS — M86142 Other acute osteomyelitis, left hand: Secondary | ICD-10-CM | POA: Diagnosis not present

## 2024-05-27 DIAGNOSIS — S68621A Partial traumatic transphalangeal amputation of left index finger, initial encounter: Secondary | ICD-10-CM | POA: Diagnosis not present

## 2024-05-27 DIAGNOSIS — Y92009 Unspecified place in unspecified non-institutional (private) residence as the place of occurrence of the external cause: Secondary | ICD-10-CM

## 2024-05-27 DIAGNOSIS — I2510 Atherosclerotic heart disease of native coronary artery without angina pectoris: Secondary | ICD-10-CM | POA: Diagnosis not present

## 2024-05-27 DIAGNOSIS — S61201A Unspecified open wound of left index finger without damage to nail, initial encounter: Secondary | ICD-10-CM | POA: Diagnosis not present

## 2024-05-27 DIAGNOSIS — E872 Acidosis, unspecified: Secondary | ICD-10-CM | POA: Diagnosis not present

## 2024-05-27 DIAGNOSIS — I129 Hypertensive chronic kidney disease with stage 1 through stage 4 chronic kidney disease, or unspecified chronic kidney disease: Secondary | ICD-10-CM | POA: Diagnosis not present

## 2024-05-27 DIAGNOSIS — N1832 Chronic kidney disease, stage 3b: Secondary | ICD-10-CM | POA: Diagnosis not present

## 2024-05-27 DIAGNOSIS — I959 Hypotension, unspecified: Secondary | ICD-10-CM | POA: Diagnosis not present

## 2024-05-27 DIAGNOSIS — M4802 Spinal stenosis, cervical region: Secondary | ICD-10-CM | POA: Diagnosis not present

## 2024-05-27 LAB — ACETAMINOPHEN LEVEL: Acetaminophen (Tylenol), Serum: <10 ug/mL — ABNORMAL LOW (ref 10–30)

## 2024-05-27 LAB — BASIC METABOLIC PANEL WITH GFR
Anion gap: 15 (ref 5–15)
BUN: 64 mg/dL — ABNORMAL HIGH (ref 8–23)
CO2: 17 mmol/L — ABNORMAL LOW (ref 22–32)
Calcium: 8.5 mg/dL — ABNORMAL LOW (ref 8.9–10.3)
Chloride: 105 mmol/L (ref 98–111)
Creatinine, Ser: 5.58 mg/dL — ABNORMAL HIGH (ref 0.44–1.00)
GFR, Estimated: 7 mL/min — ABNORMAL LOW (ref >60)
Glucose, Bld: 62 mg/dL — ABNORMAL LOW (ref 70–99)
Potassium: 5.2 mmol/L — ABNORMAL HIGH (ref 3.5–5.1)
Sodium: 137 mmol/L (ref 135–145)

## 2024-05-27 LAB — RAPID URINE DRUG SCREEN, HOSP PERFORMED
Amphetamines: NOT DETECTED
Barbiturates: NOT DETECTED
Benzodiazepines: NOT DETECTED
Cocaine: NOT DETECTED
Opiates: NOT DETECTED
Tetrahydrocannabinol: NOT DETECTED

## 2024-05-27 LAB — I-STAT CHEM 8, ED
BUN: 70 mg/dL — ABNORMAL HIGH (ref 8–23)
Calcium, Ion: 1.09 mmol/L — ABNORMAL LOW (ref 1.15–1.40)
Chloride: 106 mmol/L (ref 98–111)
Creatinine, Ser: 5.9 mg/dL — ABNORMAL HIGH (ref 0.44–1.00)
Glucose, Bld: 206 mg/dL — ABNORMAL HIGH (ref 70–99)
HCT: 26 % — ABNORMAL LOW (ref 36.0–46.0)
Hemoglobin: 8.8 g/dL — ABNORMAL LOW (ref 12.0–15.0)
Potassium: 5.6 mmol/L — ABNORMAL HIGH (ref 3.5–5.1)
Sodium: 134 mmol/L — ABNORMAL LOW (ref 135–145)
TCO2: 18 mmol/L — ABNORMAL LOW (ref 22–32)

## 2024-05-27 LAB — URINALYSIS, W/ REFLEX TO CULTURE (INFECTION SUSPECTED)
Bilirubin Urine: NEGATIVE
Glucose, UA: NEGATIVE mg/dL
Ketones, ur: NEGATIVE mg/dL
Nitrite: NEGATIVE
Protein, ur: NEGATIVE mg/dL
Specific Gravity, Urine: 1.008 (ref 1.005–1.030)
pH: 5 (ref 5.0–8.0)

## 2024-05-27 LAB — CBG MONITORING, ED
Glucose-Capillary: 109 mg/dL — ABNORMAL HIGH (ref 70–99)
Glucose-Capillary: 110 mg/dL — ABNORMAL HIGH (ref 70–99)
Glucose-Capillary: 120 mg/dL — ABNORMAL HIGH (ref 70–99)
Glucose-Capillary: 128 mg/dL — ABNORMAL HIGH (ref 70–99)
Glucose-Capillary: 134 mg/dL — ABNORMAL HIGH (ref 70–99)
Glucose-Capillary: 143 mg/dL — ABNORMAL HIGH (ref 70–99)
Glucose-Capillary: 148 mg/dL — ABNORMAL HIGH (ref 70–99)
Glucose-Capillary: 149 mg/dL — ABNORMAL HIGH (ref 70–99)
Glucose-Capillary: 161 mg/dL — ABNORMAL HIGH (ref 70–99)
Glucose-Capillary: 174 mg/dL — ABNORMAL HIGH (ref 70–99)
Glucose-Capillary: 203 mg/dL — ABNORMAL HIGH (ref 70–99)
Glucose-Capillary: 22 mg/dL — CL (ref 70–99)
Glucose-Capillary: 39 mg/dL — CL (ref 70–99)
Glucose-Capillary: 52 mg/dL — ABNORMAL LOW (ref 70–99)
Glucose-Capillary: 58 mg/dL — ABNORMAL LOW (ref 70–99)
Glucose-Capillary: 63 mg/dL — ABNORMAL LOW (ref 70–99)
Glucose-Capillary: 67 mg/dL — ABNORMAL LOW (ref 70–99)
Glucose-Capillary: 83 mg/dL (ref 70–99)

## 2024-05-27 LAB — C-REACTIVE PROTEIN: CRP: 8.2 mg/dL — ABNORMAL HIGH (ref <1.0)

## 2024-05-27 LAB — PROTIME-INR
INR: 1.1 (ref 0.8–1.2)
Prothrombin Time: 14.8 s (ref 11.4–15.2)

## 2024-05-27 LAB — CBC
HCT: 31.6 % — ABNORMAL LOW (ref 36.0–46.0)
Hemoglobin: 10 g/dL — ABNORMAL LOW (ref 12.0–15.0)
MCH: 29.9 pg (ref 26.0–34.0)
MCHC: 31.6 g/dL (ref 30.0–36.0)
MCV: 94.3 fL (ref 80.0–100.0)
Platelets: 212 K/uL (ref 150–400)
RBC: 3.35 MIL/uL — ABNORMAL LOW (ref 3.87–5.11)
RDW: 15.7 % — ABNORMAL HIGH (ref 11.5–15.5)
WBC: 7.5 K/uL (ref 4.0–10.5)
nRBC: 0 % (ref 0.0–0.2)

## 2024-05-27 LAB — SEDIMENTATION RATE: Sed Rate: 40 mm/h — ABNORMAL HIGH (ref 0–22)

## 2024-05-27 LAB — SALICYLATE LEVEL: Salicylate Lvl: <7 mg/dL — ABNORMAL LOW (ref 7.0–30.0)

## 2024-05-27 LAB — GLUCOSE, CAPILLARY
Glucose-Capillary: 71 mg/dL (ref 70–99)
Glucose-Capillary: 82 mg/dL (ref 70–99)

## 2024-05-27 LAB — APTT: aPTT: 31 s (ref 24–36)

## 2024-05-27 LAB — URIC ACID: Uric Acid, Serum: 4.5 mg/dL (ref 2.5–7.1)

## 2024-05-27 LAB — ETHANOL: Alcohol, Ethyl (B): <15 mg/dL (ref <15)

## 2024-05-27 MED ORDER — PANTOPRAZOLE SODIUM 20 MG PO TBEC
20.0000 mg | DELAYED_RELEASE_TABLET | Freq: Every day | ORAL | Status: DC
  Administered 2024-05-27 – 2024-06-05 (×9): 20 mg via ORAL
  Filled 2024-05-27 (×9): qty 1

## 2024-05-27 MED ORDER — SODIUM CHLORIDE 0.9% FLUSH
3.0000 mL | Freq: Two times a day (BID) | INTRAVENOUS | Status: DC
  Administered 2024-05-27 – 2024-06-05 (×16): 3 mL via INTRAVENOUS

## 2024-05-27 MED ORDER — DEXTROSE 50 % IV SOLN
50.0000 mL | Freq: Once | INTRAVENOUS | Status: AC
  Administered 2024-05-26: 50 mL via INTRAVENOUS

## 2024-05-27 MED ORDER — IOHEXOL 350 MG/ML SOLN
75.0000 mL | Freq: Once | INTRAVENOUS | Status: AC | PRN
  Administered 2024-05-27: 75 mL via INTRAVENOUS

## 2024-05-27 MED ORDER — DEXTROSE 50 % IV SOLN
50.0000 mL | Freq: Once | INTRAVENOUS | Status: AC
  Administered 2024-05-27: 50 mL via INTRAVENOUS

## 2024-05-27 MED ORDER — ACETAMINOPHEN 650 MG RE SUPP: 650.0000 mg | Freq: Four times a day (QID) | RECTAL | Status: DC | PRN

## 2024-05-27 MED ORDER — COLCHICINE 0.3 MG HALF TABLET
0.3000 mg | ORAL_TABLET | Freq: Every day | ORAL | Status: DC
  Administered 2024-05-27 – 2024-06-05 (×9): 0.3 mg via ORAL
  Filled 2024-05-27 (×10): qty 1

## 2024-05-27 MED ORDER — ACETAMINOPHEN 325 MG PO TABS
650.0000 mg | ORAL_TABLET | Freq: Four times a day (QID) | ORAL | Status: DC | PRN
  Administered 2024-05-30 – 2024-06-06 (×4): 650 mg via ORAL
  Filled 2024-05-27 (×4): qty 2

## 2024-05-27 MED ORDER — DEXTROSE 10 % IV SOLN: INTRAVENOUS | Status: DC

## 2024-05-27 MED ORDER — ALBUTEROL SULFATE (2.5 MG/3ML) 0.083% IN NEBU: 2.5000 mg | INHALATION_SOLUTION | Freq: Four times a day (QID) | RESPIRATORY_TRACT | Status: DC | PRN

## 2024-05-27 MED ORDER — SERTRALINE HCL 50 MG PO TABS
25.0000 mg | ORAL_TABLET | Freq: Every day | ORAL | Status: DC
  Administered 2024-05-27 – 2024-06-05 (×9): 25 mg via ORAL
  Filled 2024-05-27 (×9): qty 1

## 2024-05-27 MED ORDER — SODIUM CHLORIDE 0.9 % IV SOLN
3.0000 g | INTRAVENOUS | Status: DC
Start: 2024-05-28 — End: 2024-05-29
  Administered 2024-05-28 – 2024-05-29 (×2): 3 g via INTRAVENOUS
  Filled 2024-05-27 (×2): qty 8

## 2024-05-27 MED ORDER — DEXTROSE 50 % IV SOLN
50.0000 mL | INTRAVENOUS | Status: DC | PRN
  Administered 2024-05-27 (×2): 50 mL via INTRAVENOUS
  Filled 2024-05-27 (×3): qty 50

## 2024-05-27 MED ORDER — HEPARIN SODIUM (PORCINE) 5000 UNIT/ML IJ SOLN
5000.0000 [IU] | Freq: Three times a day (TID) | INTRAMUSCULAR | Status: DC
  Administered 2024-05-27 – 2024-06-05 (×25): 5000 [IU] via SUBCUTANEOUS
  Filled 2024-05-27 (×23): qty 1

## 2024-05-27 MED ORDER — ROSUVASTATIN CALCIUM 5 MG PO TABS
5.0000 mg | ORAL_TABLET | Freq: Every day | ORAL | Status: DC
  Administered 2024-05-29 – 2024-06-04 (×7): 5 mg via ORAL
  Filled 2024-05-27 (×7): qty 1

## 2024-05-27 MED ORDER — DEXTROSE-SODIUM CHLORIDE 5-0.9 % IV SOLN: Freq: Once | INTRAVENOUS | Status: AC

## 2024-05-27 MED ORDER — DEXTROSE 50 % IV SOLN
INTRAVENOUS | Status: AC
  Administered 2024-05-27: 50 mL via INTRAVENOUS
  Filled 2024-05-27: qty 50

## 2024-05-27 MED ORDER — DEXTROSE-SODIUM CHLORIDE 5-0.9 % IV SOLN: INTRAVENOUS | Status: DC

## 2024-05-27 MED ORDER — COLCHICINE 0.6 MG PO TABS: 0.6000 mg | ORAL_TABLET | Freq: Every day | ORAL | Status: DC

## 2024-05-27 MED ORDER — ALLOPURINOL 300 MG PO TABS
300.0000 mg | ORAL_TABLET | Freq: Every day | ORAL | Status: DC
  Administered 2024-05-27 – 2024-06-05 (×9): 300 mg via ORAL
  Filled 2024-05-27 (×9): qty 1

## 2024-05-27 NOTE — ED Notes (Signed)
 Floor notified patient coming up

## 2024-05-27 NOTE — ED Notes (Addendum)
 pt altered and not using appropriate words, or speaking. Responds to pain and opens eyes to voice. MD Pamella advised and reported to bedside.

## 2024-05-27 NOTE — ED Provider Notes (Signed)
.  Critical Care  Performed by: Pamella Ozell LABOR, DO Authorized by: Pamella Ozell LABOR, DO   Critical care provider statement:    Critical care time (minutes):  40   Critical care was necessary to treat or prevent imminent or life-threatening deterioration of the following conditions:  CNS failure or compromise   Critical care was time spent personally by me on the following activities:  Development of treatment plan with patient or surrogate, discussions with consultants, evaluation of patient's response to treatment, examination of patient, ordering and review of laboratory studies, ordering and review of radiographic studies, ordering and performing treatments and interventions, pulse oximetry, re-evaluation of patient's condition, review of old charts and obtaining history from patient or surrogate   I assumed direction of critical care for this patient from another provider in my specialty: no     Care discussed with: admitting provider       Pamella Ozell LABOR, DO 05/27/24 0135

## 2024-05-27 NOTE — ED Provider Notes (Signed)
 I endorsed the patient to Dr. Alfornia with [facility] hospitalist Patient is now improving after found to be hypoglycemic She is much more awake and alert.  Discussed with Dr. Vanessa with neurology who has canceled her code stroke  Previous team had already consulted nephrology as well as hand surgery.  Please see their note for further details     Midge Golas, MD 05/27/24 [phone]

## 2024-05-27 NOTE — ED Notes (Addendum)
 Pt pulling at IV lines and monitoring equipment and confused. Mits applied for safety.

## 2024-05-27 NOTE — Plan of Care (Signed)
?  Problem: Nutrition: ?Goal: Adequate nutrition will be maintained ?Outcome: Not Progressing ?  ?Problem: Safety: ?Goal: Ability to remain free from injury will improve ?Outcome: Not Progressing ?  ?

## 2024-05-27 NOTE — ED Notes (Signed)
 Pt cbg 29 MD Wickline advised and orders placed.

## 2024-05-27 NOTE — ED Notes (Signed)
 Floor called and made aware of PT arrival

## 2024-05-27 NOTE — H&P (Addendum)
 History and Physical    Patient: Yvonne Newton FMW:[id number] DOB: 1940-12-20 DOA: 05/26/2024 DOS: the patient was seen and examined on 05/27/2024 PCP: [facility]  Patient coming from: Home via EMS  Chief Complaint:  Chief Complaint  Patient presents with   Wound Infection    Finger infection   HPI: Yvonne Newton is a 83 y.o. female with medical history significant of hypertension, hyperlipidemia, diastolic CHF, history of stroke,  diabetes mellitus type 2, vitamin B12 deficiency, and and osteoarthritis who presents after having a fall at home.  Patient lives at home with her daughter and additional history is obtained from one of the patient sons.  It is reported that the patient had not been eating or drinking at least for the last 2 days and was sleeping more than usual as well as had been having hallucinations.  Yesterday the patient had stood up from bed and fallen.  At baseline patient has dementia and her family had been progressively worsening not always oriented to time.  Review of records note that she had been taking 2 [facility] last on 5/24 for left index finger which have been swollen, erythematous, and warm to the touch with scant drainage. She was diagnosed with cellulitis of the left finger associated with gout and started on doxycycline with mupirocin ointment.  The patient currently denies having any symptoms.  With EMS noted to have stable vital signs with initial glucose 89.  In the emergency department patient was noted to be afebrile mild tachypnea, and blood pressures 103/48 to 109/81 and all other vital signs maintained.  Patient was initially seen as a possible code stroke.  CT scan of the head and cervical spine which did not note any acute intracranial or bony cervical abnormality. Labs significant for hemoglobin 10.2, sodium 134, potassium 5.6, CO2 16, BUN 72, creatinine 6.36, glucose 70, anion gap 18, and lactic acid 2.8.  Chest x-ray  noted cardiac enlargement with infiltration suggestive right lower lobe lung with some bronchiectasis.  X-rays of the left hand noted communicated intra-articular fracture at the base of the second distal phalanx and associated soft tissue swelling with possible open wound.  Urinalysis noted small amount of hemoglobin, trace leukocytes, few bacteria, and 11-20 squamous epithelial cells/hpf.  Patient had been given 2 L of normal saline IV fluids and at least 3 A of D50 due to low blood sugars prior to being started on D5-0.9% normal saline IV fluids at 100 mL/h.   Review of Systems: As mentioned in the history of present illness. All other systems reviewed and are negative. Past Medical History:  Diagnosis Date   Arthritis 07/22/2017   Cerebellar stroke (HCC)    CHF (congestive heart failure) (HCC)    Diabetes mellitus type 2 in nonobese (HCC)    DM (diabetes mellitus) (HCC)    Dysarthria    Essential hypertension 07/22/2017   Hyperlipidemia, unspecified 07/22/2017   OA (osteoarthritis) of knee    right   Obesity    Stroke (HCC)    Tachycardia 07/22/2017   VENTRICULAR TACHYCARDIA 09/05/2009   Qualifier: Diagnosis of  By: Lawernce CMA, Jewel     Ventricular tachycardia (HCC)    Vitamin B 12 deficiency    Past Surgical History:  Procedure Laterality Date   BREAST BIOPSY Left 2010   Negative   CORONARY PRESSURE/FFR STUDY N/A 07/14/2022   Procedure: INTRAVASCULAR PRESSURE WIRE/FFR STUDY;  Surgeon: Darron Deatrice LABOR, MD;  Location: [facility] INVASIVE CV LAB;  Service: Cardiovascular;  Laterality: N/A;   LEFT HEART CATH AND CORONARY ANGIOGRAPHY N/A 07/14/2022   Procedure: LEFT HEART CATH AND CORONARY ANGIOGRAPHY;  Surgeon: Darron Deatrice LABOR, MD;  Location: [facility] INVASIVE CV LAB;  Service: Cardiovascular;  Laterality: N/A;   LOOP RECORDER INSERTION N/A 11/03/2017   Procedure: LOOP RECORDER INSERTION;  Surgeon: Inocencio Soyla Lunger, MD;  Location: [facility] INVASIVE CV LAB;  Service: Cardiovascular;   Laterality: N/A;   TEE WITHOUT CARDIOVERSION N/A 11/03/2017   Procedure: TRANSESOPHAGEAL ECHOCARDIOGRAM (TEE);  Surgeon: Maranda Leim DEL, MD;  Location: [facility] ENDOSCOPY;  Service: Cardiovascular;  Laterality: N/A;   Social History:  reports that she quit smoking about 25 years ago. Her smoking use included cigarettes. She has never used smokeless tobacco. She reports that she does not drink alcohol and does not use drugs.  Allergies  Allergen Reactions   Blueberry Flavoring Agent (Non-Screening) Hives and Shortness Of Breath   Influenza Vaccines    Sulfa Antibiotics Itching   Sulfonamide Derivatives Itching   Other Swelling and Rash    METAL    Family History  Problem Relation Age of Onset   Heart attack Father 73   Heart attack Brother 21   Heart attack Brother        had CABG   Breast cancer Neg Hx     Prior to Admission medications   Medication Sig Start Date End Date Taking? Authorizing Provider  allopurinol  (ZYLOPRIM ) 300 MG tablet Take 300 mg by mouth daily. 05/03/24  Yes [provider]  aspirin  EC 81 MG EC tablet Take 1 tablet (81 mg total) by mouth daily. 11/05/17  Yes Rizwan, Saima, MD  carvedilol  (COREG ) 12.5 MG tablet Take 1 tablet (12.5 mg total) by mouth 2 (two) times daily with a meal. 09/16/23  Yes Waddell Danelle ORN, MD  colchicine  0.6 MG tablet Take 0.6 mg by mouth daily.   Yes [provider]  cyanocobalamin  (VITAMIN B12) 1000 MCG tablet Take 1,000 mcg by mouth daily.   Yes [provider]  diltiazem  (CARDIZEM  CD) 120 MG 24 hr capsule TAKE 1 CAPSULE BY MOUTH EVERY DAY 12/02/23  Yes Waddell Danelle ORN, MD  doxycycline (VIBRAMYCIN) 100 MG capsule Take 100 mg by mouth 2 (two) times daily. for 7 days 05/25/24 06/01/24 Yes [provider]  furosemide  (LASIX ) 40 MG tablet TAKE 2 TABLETS BY MOUTH EVERY DAY 03/31/24  Yes Lee, [place], NP  glipiZIDE (GLUCOTROL XL) 2.5 MG 24 hr tablet Take 1 tablet by mouth daily. 09/17/23  Yes [provider]   ibuprofen (ADVIL) 800 MG tablet Take 800 mg by mouth daily as needed for moderate pain (pain score 4-6). 10/20/23  Yes [provider]  losartan  (COZAAR ) 25 MG tablet TAKE 1/2 TABLET BY MOUTH DAILY 12/15/23  Yes [place] A, FNP  metFORMIN (GLUCOPHAGE-XR) 500 MG 24 hr tablet Take 500 mg by mouth 2 (two) times daily with a meal. 01/25/16  Yes [provider]  pantoprazole  (PROTONIX ) 20 MG tablet Take 20 mg by mouth daily. 06/18/23  Yes [provider]  potassium chloride  SA (KLOR-CON  M) 20 MEQ tablet TAKE 2 TABLETS BY MOUTH EVERY DAY Patient taking differently: Take 20 mEq by mouth 2 (two) times daily. 03/31/24  Yes Lee, [place], NP  rosuvastatin  (CRESTOR ) 5 MG tablet Take 1 tablet (5 mg total) by mouth daily at 6 PM. 01/19/18  Yes McCue, Harlene, NP  sertraline  (ZOLOFT ) 25 MG tablet Take 25 mg by mouth daily. 05/05/24 05/05/25 Yes [provider]  risperiDONE (  RISPERDAL) 0.5 MG tablet TAKE 1 TAB BY MOUTH 2 TIMES DAILY FOR 30 DAYS START 1 IN AFTERNOON AROUND 2 PM AND TITRATE IF NEEDED Patient not taking: Reported on 05/27/2024 05/17/24   [provider]    Physical Exam: Vitals:   05/27/24 9781 05/27/24 0628 05/27/24 0656 05/27/24 0730  BP:   (!) 109/47 102/67  Pulse:   68 77  Resp:   (!) 22 18  Temp: 98.2 F (36.8 C) 98 F (36.7 C)  97.7 F (36.5 C)  TempSrc: Oral Oral  Oral  SpO2:   100% 99%  Weight:      Height:       Constitutional: Elderly female currently NAD, calm, comfortable Eyes: PERRL, lids and conjunctivae normal ENMT: Mucous membranes are moist.  .Normal dentition.  Neck: normal, supple, no masses, no thyromegaly Respiratory: clear to auscultation bilaterally, no wheezing, no crackles. Normal respiratory effort.   Cardiovascular: Regular rate and rhythm, no murmurs / rubs / gallops. No extremity edema.   Abdomen: no tenderness, no masses palpated.  Bowel sounds positive.  Musculoskeletal: no clubbing / cyanosis.  Multiple gouty  tophi over the and joints noted bilaterally with more significant swelling noted of the left index finger in particular Skin: Erythema noted of the distal aspect of the left index finger Neurologic: CN 2-12 grossly intact. Sensation intact, DTR normal. Strength 5/5 in all 4.  Psychiatric: Normal judgment and insight. Alert and oriented x person and place. Normal mood.   Data Reviewed:  EKG reveals sinus rhythm at 64 bpm with first-degree heart block.  Reviewed labs, imaging, and pertinent replace  Assessment and Plan:  Acute kidney injury superimposed on chronic kidney disease stage IIIb Hyperkalemia Creatinine noted to be elevated at 6.36 with a BUN 72 and potassium 5.6.  Creatinine previously noted to be around 1.86 when checked on 04/21/2024.  Urinalysis did not show any signs for infection and appear to be contaminated with skin cells.  Nephrology have been consulted to valuate.  Suspect prerenal in nature given family's reports of decreased p.o. intake over the last 2 days prior to admission.  Patient has been given IV fluid boluses. - Strict I&Os - Avoid possible nephrotoxic agent - Continue IV fluids - Recheck kidney function in a.m.  Diabetes mellitus type 2 with hypoglycemia, without long-term use of insulin  Acute.  Patient noted to be hypoglycemic into the 20s while in the ED.  Received at least 3 A of D50 and was placed on a D5 0.9% normal saline IV fluid bolus. - Admit to a progressive bed - Hypoglycemic protocols - Hold metformin and glipizide - D10 IV fluids  Suspect infected left index finger Communicated fracture second distal phalanx Acute.  Patient presented with swelling and drainage of the left index finger.  Recently started on antibiotics with concern for cellulitis.  X-rays revealed communicated intra-articular fracture at the base of the second distal phalanx and associated soft tissue swelling with possible open wound.  Hand surgery was consulted. -Check ESR (40)  and CRP (8.7) - Continue empiric antibiotics of Unasyn  - N.p.o. after midnight for possible need of surgery - Appreciate hand surgery consultative services we will follow-up for any further recommendations  Acute metabolic encephalopathy Dementia Patient presents after being more altered than baseline with reports of hallucinations.  Noted to be hypoglycemic with concerns for infection of the left finger. Initially had been called out as a code stroke.  CT scan of the head did not note any acute abnormality  and was evaluated by neurology who felt less likely.   At baseline patient has history of dementia that family reports have progressively worsening.  Symptoms may be multifactorial in nature.  Of note patient appears to have been recently prescribed Seroquel but not started the medication yet. - Delirium precautions - Continue to monitor and consider further workup as deemed medically appropriate  Fall/syncope and collapse Patient was reported to have fallen/passed out after standing up from bed.  Suspect likely related to orthostatic hypotension given soft blood pressures. - Check orthostatic vital signs once able - PT to eval and treat  Metabolic acidosis with elevated anion gap Lactic acidosis Acute.  CO2 noted to be 16 with anion gap of 18 and initial lactic acid 2.8.  Thought secondary to lactic acidosis from dehydration and/or patient's renal dysfunction.  After initial fluids repeat lactic acid 1.6. - Continue to monitor  Essential hypertension Blood pressures noted to be soft.   - Holding home blood pressure regimen and reassessing when medically appropriate to resume  Normocytic anemia Acute on chronic.  Hemoglobin initially noted to be 10.2 with repeat check 8.8. - Continue to monitor H&H  Hyperlipidemia - Continue Crestor   Gout Chronic.  Question gout as the cause for findings in the second index finger. - Continue allopurinol  and colchicine   GERD - Continue  Protonix   DVT prophylaxis: Heparin  after able to confirm hemoglobin stable Advance Care Planning:   Code Status: Full Code    Consults: Orthopedics, nephrology  Family Communication: Patient's son updated over the phone Severity of Illness: The appropriate patient status for this patient is INPATIENT. Inpatient status is judged to be reasonable and necessary in order to provide the required intensity of service to ensure the patient's safety. The patient's presenting symptoms, physical exam findings, and initial radiographic and laboratory data in the context of their chronic comorbidities is felt to place them at high risk for further clinical deterioration. Furthermore, it is not anticipated that the patient will be medically stable for discharge from the hospital within 2 midnights of admission.   * I certify that at the point of admission it is my clinical judgment that the patient will require inpatient hospital care spanning beyond 2 midnights from the point of admission due to high intensity of service, high risk for further deterioration and high frequency of surveillance required.*  Author: Maximino DELENA Sharps, MD 05/27/2024 8:41 AM  For on call review [email or web address].

## 2024-05-27 NOTE — Consult Note (Signed)
 Orthopaedic Surgery Hand and Upper Extremity History and Physical Examination 05/27/2024   CC: Left index finger pain and swelling  HPI: Zenovia Justman is a 83 y.o. female with history of gouty tophi of bilateral fingers with 1 month of pain at distal phalanx of left index finger.  She has dementia but according to family, has not had any known injury.  She had reportedly been picking at the gouty tophus along the dorsal index PIP joint.  Presented to urgent care 3 days ago due to worsening pain and was put on antibiotics for possible infection of left index finger.   Past Medical History: Past Medical History:  Diagnosis Date   Arthritis 07/22/2017   Cerebellar stroke (HCC)    CHF (congestive heart failure) (HCC)    Diabetes mellitus type 2 in nonobese (HCC)    DM (diabetes mellitus) (HCC)    Dysarthria    Essential hypertension 07/22/2017   Hyperlipidemia, unspecified 07/22/2017   OA (osteoarthritis) of knee    right   Obesity    Stroke (HCC)    Tachycardia 07/22/2017   VENTRICULAR TACHYCARDIA 09/05/2009   Qualifier: Diagnosis of  By: Lawernce CMA, Jewel     Ventricular tachycardia (HCC)    Vitamin B 12 deficiency      Medications: Scheduled Meds:  sodium chloride  flush  3 mL Intravenous Q12H   Continuous Infusions:  [START ON 05/28/2024] ampicillin -sulbactam (UNASYN ) IV     dextrose  100 mL/hr at 05/27/24 1407   PRN Meds:.acetaminophen  **OR** acetaminophen , albuterol , dextrose   Allergies: Allergies as of 05/26/2024 - Review Complete 05/26/2024  Allergen Reaction Noted   Blueberry flavoring agent (non-screening) Hives and Shortness Of Breath 11/01/2017   Influenza vaccines  07/14/2022   Sulfa antibiotics Itching 03/12/2014   Sulfonamide derivatives Itching    Other Swelling and Rash 02/01/2016    Past Surgical History: Past Surgical History:  Procedure Laterality Date   BREAST BIOPSY Left 2010   Negative   CORONARY PRESSURE/FFR STUDY N/A 07/14/2022    Procedure: INTRAVASCULAR PRESSURE WIRE/FFR STUDY;  Surgeon: Darron Deatrice LABOR, MD;  Location: [facility] INVASIVE CV LAB;  Service: Cardiovascular;  Laterality: N/A;   LEFT HEART CATH AND CORONARY ANGIOGRAPHY N/A 07/14/2022   Procedure: LEFT HEART CATH AND CORONARY ANGIOGRAPHY;  Surgeon: Darron Deatrice LABOR, MD;  Location: [facility] INVASIVE CV LAB;  Service: Cardiovascular;  Laterality: N/A;   LOOP RECORDER INSERTION N/A 11/03/2017   Procedure: LOOP RECORDER INSERTION;  Surgeon: Inocencio Soyla Lunger, MD;  Location: [facility] INVASIVE CV LAB;  Service: Cardiovascular;  Laterality: N/A;   TEE WITHOUT CARDIOVERSION N/A 11/03/2017   Procedure: TRANSESOPHAGEAL ECHOCARDIOGRAM (TEE);  Surgeon: Maranda Leim DEL, MD;  Location: [facility] ENDOSCOPY;  Service: Cardiovascular;  Laterality: N/A;     Social History: Social History   Occupational History   Not on file  Tobacco Use   Smoking status: Former    Current packs/day: 0.00    Types: Cigarettes    Quit date: 09/01/1998    Years since quitting: 25.7   Smokeless tobacco: Never  Vaping Use   Vaping status: Never Used  Substance and Sexual Activity   Alcohol use: No   Drug use: No   Sexual activity: Not on file     Family History: Family History  Problem Relation Age of Onset   Heart attack Father 49   Heart attack Brother 48   Heart attack Brother        had CABG   Breast cancer Neg Hx    Otherwise,  no relevant orthopaedic family history  ROS: Review of Systems: All systems reviewed and are negative except that mentioned in HPI  Work/Sport/Hobbies: See HPI  Physical Examination: Vitals:   05/27/24 1350 05/27/24 1359  BP: (!) 142/55 (!) 142/55  Pulse: 71 71  Resp: 20 20  Temp:  97.7 F (36.5 C)  SpO2: 99% 99%   Constitutional: Awake, alert.  WN/WD Appearance: healthy, no acute distress, well-groomed Affect: Normal HEENT: EOMI, mucous membranes moist CV: RRR Pulm: breathing comfortably   left Upper Extremity / Hand Swelling along entire index  finger.  Superficial wound present over dorsal PIP joint with what appears to be gouty tophus coming through the skin.  Non-tender along volar distal phalanx but tender along flexor tendon sheath over proximal phalanx but not in palm of hand.   Pertinent Labs: Normal WBC, slightly elevated CRP and ESR  Imaging: I have personally reviewed the following studies: Left index distal phalanx fracture versus gouty erosion.  Additional Studies: n/a  Assessment/Plan: Nashonda Limberg is a 83 y.o. female with chronic gout with possible superimposed fracture versus infection   -the dorsal tophus appears to be eroding through the skin.  There is also concern she could be developing infective flexor tenosynovitis as well (vs persistent gout flare vs pain and swelling from fracture).  We discussed that it may not be able to repaired and given the underlying fracture/bony erosion, not having skin coverage would not allow the bone to heal.  Additionally, she would be at risk of a skin graft or any reconstructive option failing.  Because of these reasons, we discussed possible partial amputation to be determined during I&D.  -Plan for OR tomorrow morning for left index finger I&D and possible partial amputation of left index finger.  -Please continue antibiotics at this time. -Please make NPO at midnight -keep left index covered and non-weight bearing   Marsa Christen, MD Hand and Upper Extremity Surgery [facility] [phone] 05/27/2024 2:49 PM

## 2024-05-27 NOTE — ED Provider Notes (Signed)
 I was made aware by nursing staff that patient is hypoglycemic once again Patient has been given dextrose  per nursing.  Will also start her on a D5 normal saline infusion   Midge Golas, MD 05/27/24 [phone]

## 2024-05-27 NOTE — ED Notes (Signed)
 Pt transported to US 

## 2024-05-27 NOTE — ED Provider Notes (Signed)
 Patient is noted to be on glipizide which she is likely having difficulty clearing due to her underlying renal failure Will recommend continued CBG every hour   Midge Golas, MD 05/27/24 [phone]

## 2024-05-27 NOTE — Progress Notes (Signed)
 [facility] note:  Yvonne Newton is a 83 y.o. female with hx of cerebellar stroke, DM2, HTN, Hld, prior B12 deficiency, who came to the ED with sleeping all day, hallucinations, left index finger infection and AKI.  A code stroke was activated for worsening drowsiness, slurred speech with a LKW of 2330.  On initial evaluation, her NIHSS was as below:  NIHSS components Score: Comment  1a Level of Conscious 0[]  1[x]  2[]  3[]      1b LOC Questions 0[]  1[]  2[x]       1c LOC Commands 0[]  1[]  2[x]       2 Best Gaze 0[x]  1[]  2[]       3 Visual 0[x]  1[]  2[]  3[]      4 Facial Palsy 0[x]  1[]  2[]  3[]      5a Motor Arm - left 0[]  1[]  2[x]  3[]  4[]  UN[]    5b Motor Arm - Right 0[]  1[]  2[x]  3[]  4[]  UN[]    6a Motor Leg - Left 0[]  1[]  2[x]  3[]  4[]  UN[]    6b Motor Leg - Right 0[]  1[]  2[x]  3[]  4[]  UN[]    7 Limb Ataxia 0[x]  1[]  2[]  3[]  UN[]     8 Sensory 0[x]  1[]  2[]  UN[]      9 Best Language 0[]  1[]  2[x]  3[]      10 Dysarthria 0[]  1[]  2[x]  UN[]      11 Extinct. and Inattention 0[x]  1[]  2[]       TOTAL: 17    CT head w/o contrast with no acute abnormalities, CTA head and neck with no basiular thrombus, CTV head with no CVST.  She was eventually found to be hypoglycemic with glucose of 22. She was back to her baseline after dextrose  was administered.  NIHSS components Score: Comment  1a Level of Conscious 0[x]  1[]  2[]  3[]      1b LOC Questions 0[x]  1[]  2[]       1c LOC Commands 0[x]  1[]  2[]       2 Best Gaze 0[x]  1[]  2[]       3 Visual 0[x]  1[]  2[]  3[]      4 Facial Palsy 0[x]  1[]  2[]  3[]      5a Motor Arm - left 0[x]  1[]  2[]  3[]  4[]  UN[]    5b Motor Arm - Right 0[x]  1[]  2[]  3[]  4[]  UN[]    6a Motor Leg - Left 0[x]  1[]  2[]  3[]  4[]  UN[]    6b Motor Leg - Right 0[x]  1[]  2[]  3[]  4[]  UN[]    7 Limb Ataxia 0[x]  1[]  2[]  3[]  UN[]     8 Sensory 0[x]  1[]  2[]  UN[]      9 Best Language 0[x]  1[]  2[]  3[]      10 Dysarthria 0[]  1[x]  2[]  UN[]      11 Extinct. and Inattention 0[x]  1[]  2[]       TOTAL: 1     Plan: Low  suspicion for stroke. Discussed with Dr. Midge and cancelled code stroke. No further inpatient neurologic workup. Neurology will signoff.  Plan discussed with Dr. Midge with the ED team, plan discussed with patient and her family at the bedside.  Jenilee Franey [facility]

## 2024-05-27 NOTE — ED Notes (Addendum)
 At approximately 1330, tech went into room to check patients sugar and found patient to be on floor criss crossed apple sauce . Pt crawled out of bed while bed rails up. Pt had BM on floor and urinated and had iv pulled out. Upon assessment patient is a&ox2 which is baseline for this RN's time with her and previous RN before me. Patients sugar was checked immediately and found to be 52. This RN placed a new iv immediately , ensured patient was stable and not injured. Patient was assisted to bed and bed alarm in place, along with her socks and arm band that was placed prior  . Educated patient importance of staying in bed. Patients son notified in person of fall. Patient did not hit head. Admitting team Claudene MD was immediately notified sugar status and of fall. Admitting doctor placed new order for D10 continuous. Son is now at bedside.

## 2024-05-27 NOTE — Consult Note (Signed)
 Reason for Consult: Severe AKI, and hyperkalemia Referring Physician: Dr. Midge Sham Yvonne Newton Yvonne Newton is an 83 y.o. female.  HPI:  83 year old female with a history of chronic gout with tophi, CKD stage IIIb, hypertension, diastolic heart failure, T2DM, osteoarthritis, who presented to the ED without sedation left finger infection.  Of note, while here code stroke was activated for worsening drowsiness, slurred speech, she was found to be hypoglycemic with a blood sugar of 36, mental status improved with D50.  Had stroke workup unremarkable.  Follows with rheumatology on 9/2, that visit dose of allopurinol  was increased from 100 to 300 mg, per chart review, it appears allopurinol  100 mg was not part of her medicines. X-ray of the left hand noted complicated intra-articular fracture at the base of the second distal phalanx with some associated swelling.  UA with trace hemoglobin and leukocyte, LE/nitrite negative.  Started on Unasyn   Denies urinary symptoms, or decreased p.o. intake.  She endorses a lot of Pepsi and Gatorade. At the ED, severe SCr elevation to 6.36.  About a month ago, SCr was 1.86.  The ED, had a chest x-ray concerning for possible aspiration pneumonia  Trend in Creatinine: Creatinine, Ser  Date/Time Value Ref Range Status  05/27/2024 12:54 AM 5.90 (H) 0.44 - 1.00 mg/dL Final  90/74/7974 90:67 PM 6.36 (H) 0.44 - 1.00 mg/dL Final  91/78/7974 93:99 PM 1.86 (H) 0.44 - 1.00 mg/dL Final  88/90/7975 97:96 AM 2.27 (H) 0.44 - 1.00 mg/dL Final  98/94/7975 88:79 AM 1.56 (H) 0.44 - 1.00 mg/dL Final  87/70/7976 88:89 AM 1.73 (H) 0.44 - 1.00 mg/dL Final  87/85/7976 98:64 PM 1.29 (H) 0.44 - 1.00 mg/dL Final  88/85/7976 93:42 AM 0.74 0.44 - 1.00 mg/dL Final  88/86/7976 95:77 AM 0.85 0.44 - 1.00 mg/dL Final  88/87/7976 96:81 AM 0.81 0.44 - 1.00 mg/dL Final  88/89/7976 94:92 PM 0.55 0.44 - 1.00 mg/dL Final  90/93/7976 95:84 PM 0.74 0.44 - 1.00 mg/dL Final  97/82/7977 91:59 AM 0.71  0.57 - 1.00 mg/dL Final  96/96/7980 90:82 AM 0.71 0.44 - 1.00 mg/dL Final    PMH:   Past Medical History:  Diagnosis Date   Arthritis 07/22/2017   Cerebellar stroke (HCC)    CHF (congestive heart failure) (HCC)    Diabetes mellitus type 2 in nonobese (HCC)    DM (diabetes mellitus) (HCC)    Dysarthria    Essential hypertension 07/22/2017   Hyperlipidemia, unspecified 07/22/2017   OA (osteoarthritis) of knee    right   Obesity    Stroke (HCC)    Tachycardia 07/22/2017   VENTRICULAR TACHYCARDIA 09/05/2009   Qualifier: Diagnosis of  By: Yvonne Newton     Ventricular tachycardia (HCC)    Vitamin B 12 deficiency     PSH:   Past Surgical History:  Procedure Laterality Date   BREAST BIOPSY Left 2010   Negative   CORONARY PRESSURE/FFR STUDY N/A 07/14/2022   Procedure: INTRAVASCULAR PRESSURE WIRE/FFR STUDY;  Surgeon: Yvonne Newton LABOR, MD;  Location: [facility] INVASIVE CV LAB;  Service: Cardiovascular;  Laterality: N/A;   LEFT HEART CATH AND CORONARY ANGIOGRAPHY N/A 07/14/2022   Procedure: LEFT HEART CATH AND CORONARY ANGIOGRAPHY;  Surgeon: Yvonne Newton LABOR, MD;  Location: [facility] INVASIVE CV LAB;  Service: Cardiovascular;  Laterality: N/A;   LOOP RECORDER INSERTION N/A 11/03/2017   Procedure: LOOP RECORDER INSERTION;  Surgeon: Yvonne Soyla Lunger, MD;  Location: [facility] INVASIVE CV LAB;  Service: Cardiovascular;  Laterality: N/A;   TEE WITHOUT  CARDIOVERSION N/A 11/03/2017   Procedure: TRANSESOPHAGEAL ECHOCARDIOGRAM (TEE);  Surgeon: Yvonne Leim DEL, MD;  Location: [facility] ENDOSCOPY;  Service: Cardiovascular;  Laterality: N/A;    Allergies:  Allergies  Allergen Reactions   Blueberry Flavoring Agent (Non-Screening) Hives and Shortness Of Breath   Influenza Vaccines    Sulfa Antibiotics Itching   Sulfonamide Derivatives Itching   Other Swelling and Rash    METAL    Medications:   Prior to Admission medications   Medication Sig Start Date End Date Taking? Authorizing Provider  allopurinol   (ZYLOPRIM ) 300 MG tablet Take 300 mg by mouth daily. 05/03/24  Yes [provider]  aspirin  EC 81 MG EC tablet Take 1 tablet (81 mg total) by mouth daily. 11/05/17  Yes Rizwan, Saima, MD  carvedilol  (COREG ) 12.5 MG tablet Take 1 tablet (12.5 mg total) by mouth 2 (two) times daily with a meal. 09/16/23  Yes Waddell Danelle ORN, MD  colchicine  0.6 MG tablet Take 0.6 mg by mouth daily.   Yes [provider]  cyanocobalamin  (VITAMIN B12) 1000 MCG tablet Take 1,000 mcg by mouth daily.   Yes [provider]  diltiazem  (CARDIZEM  CD) 120 MG 24 hr capsule TAKE 1 CAPSULE BY MOUTH EVERY DAY 12/02/23  Yes Waddell Danelle ORN, MD  doxycycline (VIBRAMYCIN) 100 MG capsule Take 100 mg by mouth 2 (two) times daily. for 7 days 05/25/24 06/01/24 Yes [provider]  furosemide  (LASIX ) 40 MG tablet TAKE 2 TABLETS BY MOUTH EVERY DAY 03/31/24  Yes Newton, [place], NP  glipiZIDE (GLUCOTROL XL) 2.5 MG 24 hr tablet Take 1 tablet by mouth daily. 09/17/23  Yes [provider]  ibuprofen (ADVIL) 800 MG tablet Take 800 mg by mouth daily as needed for moderate pain (pain score 4-6). 10/20/23  Yes [provider]  losartan  (COZAAR ) 25 MG tablet TAKE 1/2 TABLET BY MOUTH DAILY 12/15/23  Yes [place] A, FNP  metFORMIN (GLUCOPHAGE-XR) 500 MG 24 hr tablet Take 500 mg by mouth 2 (two) times daily with a meal. 01/25/16  Yes [provider]  pantoprazole  (PROTONIX ) 20 MG tablet Take 20 mg by mouth daily. 06/18/23  Yes [provider]  potassium chloride  SA (KLOR-CON  M) 20 MEQ tablet TAKE 2 TABLETS BY MOUTH EVERY DAY Patient taking differently: Take 20 mEq by mouth 2 (two) times daily. 03/31/24  Yes Newton, [place], NP  rosuvastatin  (CRESTOR ) 5 MG tablet Take 1 tablet (5 mg total) by mouth daily at 6 PM. 01/19/18  Yes Newton, Harlene, NP  sertraline  (ZOLOFT ) 25 MG tablet Take 25 mg by mouth daily. 05/05/24 05/05/25 Yes [provider]  risperiDONE (RISPERDAL) 0.5 MG tablet TAKE 1 TAB BY  MOUTH 2 TIMES DAILY FOR 30 DAYS START 1 IN AFTERNOON AROUND 2 PM AND TITRATE IF NEEDED Patient not taking: Reported on 05/27/2024 05/17/24   [provider]    Inpatient medications:   Discontinued Meds:   Medications Discontinued During This Encounter  Medication Reason   mirtazapine (REMERON) 7.5 MG tablet Patient Preference   CYANOCOBALAMIN  IJ Change in therapy   traMADol  (ULTRAM ) 50 MG tablet No longer needed (for PRN medications)   lidocaine  (XYLOCAINE ) 2 % solution No longer needed (for PRN medications)   memantine  (NAMENDA  TITRATION PACK) tablet pack Patient Preference    Social History:  reports that she quit smoking about 25 years ago. Her smoking use included cigarettes. She has never used smokeless tobacco. She reports that she does not drink alcohol and does not use drugs.  Family History:   Family History  Problem Relation Age of Onset   Heart attack Father 2   Heart attack Brother 43   Heart attack Brother        had CABG   Breast cancer Neg Hx     Weight change:  No intake or output data in the 24 hours ending 05/27/24 0900 BP 109/81   Pulse 72   Temp 97.7 F (36.5 C) (Oral)   Resp 17   Ht 5' 6 (1.676 m)   Wt 70.8 kg   SpO2 98%   BMI 25.18 kg/m  Vitals:   05/27/24 0628 05/27/24 0656 05/27/24 0730 05/27/24 0838  BP:  (!) 109/47 102/67 109/81  Pulse:  68 77 72  Resp:  (!) 22 18 17   Temp: 98 F (36.7 C)  97.7 F (36.5 C)   TempSrc: Oral  Oral   SpO2:  100% 99% 98%  Weight:      Height:          PHYSICAL EXAMS Lying in bed, not in acute distress. Heart with regular rate and rhythm, no murmurs. Clear lungs. Extremities with trace edema.  Labs: Basic Metabolic Panel: Recent Labs  Lab 05/26/24 2132 05/27/24 0054  NA 134* 134*  K 5.6* 5.6*  CL 100 106  CO2 16*  --   GLUCOSE 70 206*  BUN 72* 70*  CREATININE 6.36* 5.90*  ALBUMIN 3.4*  --   CALCIUM  8.9  --    Liver Function Tests: Recent Labs  Lab 05/26/24 2132  AST 30   ALT 17  ALKPHOS 98  BILITOT 0.8  PROT 6.7  ALBUMIN 3.4*   No results for input(s): LIPASE, AMYLASE in the last 168 hours. No results for input(s): AMMONIA in the last 168 hours. CBC: Recent Labs  Lab 05/26/24 2132 05/27/24 0054  WBC 8.1  --   NEUTROABS 5.8  --   HGB 10.2* 8.8*  HCT 32.0* 26.0*  MCV 93.0  --   PLT 226  --    PT/INR: @LABRCNTIP (inr:5) Cardiac Enzymes: ) Recent Labs  Lab 05/26/24 2132  CKTOTAL 162   CBG: Recent Labs  Lab 05/27/24 0426 05/27/24 0456 05/27/24 0608 05/27/24 0700 05/27/24 0814  GLUCAP 174* 148* 128* 110* 67*    Iron Studies: No results for input(s): IRON, TIBC, TRANSFERRIN, FERRITIN in the last 168 hours.  Xrays/Other Studies: CT ANGIO HEAD NECK W WO CM (CODE STROKE) Result Date: 05/27/2024 CLINICAL DATA:  Initial evaluation for acute neuro deficit, stroke. EXAM: CT ANGIOGRAPHY HEAD AND NECK WITH AND WITHOUT CONTRAST CT VENOGRAM HEAD TECHNIQUE: Multidetector CT imaging of the head and neck was performed using the standard protocol during bolus administration of intravenous contrast. Multiplanar CT image reconstructions and MIPs were obtained to evaluate the vascular anatomy. Carotid stenosis measurements (when applicable) are obtained utilizing NASCET criteria, using the distal internal carotid diameter as the denominator. RADIATION DOSE REDUCTION: This exam was performed according to the departmental dose-optimization program which includes automated exposure control, adjustment of the mA and/or kV according to patient size and/or use of iterative reconstruction technique. CONTRAST:  75mL OMNIPAQUE  IOHEXOL  350 MG/ML SOLN COMPARISON:  Prior CT from earlier the same day. FINDINGS: CTA NECK FINDINGS Aortic arch: Visualized aortic arch within normal limits for caliber with standard branch pattern. Aortic atherosclerosis. No visible stenosis about the origin the great vessels. Right carotid system: Right common and internal carotid  arteries are patent without dissection. Calcified plaque about the proximal cervical right ICA without hemodynamically  significant greater than 50% stenosis. Left carotid system: Left common and internal carotid arteries are patent without dissection. Mild atheromatous change about the left carotid bulb/proximal cervical left ICA without hemodynamically significant greater than 50% stenosis. Vertebral arteries: Both vertebral arteries arise from subclavian arteries. Focal 75% stenosis present at the origin of the right subclavian artery. Atheromatous change about the origins of the vertebral arteries with severe stenosis on the right (series 5, image 268). Vertebral arteries otherwise patent without stenosis or dissection. Skeleton: No worrisome osseous lesions.  Patient is edentulous. Other neck: No other acute finding. Upper chest: No other acute finding. Review of the MIP images confirms the above findings CTA HEAD FINDINGS Anterior circulation: Atheromatous change about the carotid siphons without hemodynamically significant stenosis. A1 segments patent bilaterally. Normal anterior communicating complex. Anterior cerebral arteries patent without significant stenosis. No M1 stenosis or occlusion. No proximal MCA branch occlusion. Distal MCA branches perfused and symmetric. Posterior circulation: Left V4 segment dominant and widely patent without stenosis. Left PICA patent. Right vertebral artery widely patent to the takeoff of the right PICA of the largely occludes distally. Right PICA patent. Basilar patent without stenosis. Superior cerebellar arteries patent bilaterally. Both PCAs are patent to their distal aspects without hemodynamically significant stenosis. Venous sinuses: See below. Anatomic variants: As above.  No aneurysm. Review of the MIP images confirms the above findings CT VENOGRAM FINDINGS Normal enhancement seen throughout the superior sagittal sinus to the torcula. Transverse and sigmoid sinuses  are patent as are the jugular bulbs and proximal internal jugular veins. Straight sinus, vein of Galen, and internal cerebral veins are patent. No evidence for dural venous sinus thrombosis. No appreciable cortical vein abnormality. IMPRESSION: 1. Negative CTA for large vessel occlusion or other emergent finding. 2. Atheromatous change about the origin of the right vertebral artery with severe stenosis. Right V4 segment largely occludes beyond the takeoff of the right PICA. Dominant left vertebral artery widely patent. 3. 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 4. Atheromatous change about the carotid bifurcations and carotid siphons without hemodynamically significant stenosis. 5. Negative CT venogram. No evidence for dural venous sinus thrombosis. Aortic Atherosclerosis (ICD10-I70.0). Electronically Signed   By: Morene Hoard M.D.   On: 05/27/2024 01:28   CT VENOGRAM HEAD Result Date: 05/27/2024 CLINICAL DATA:  Initial evaluation for acute neuro deficit, stroke. EXAM: CT ANGIOGRAPHY HEAD AND NECK WITH AND WITHOUT CONTRAST CT VENOGRAM HEAD TECHNIQUE: Multidetector CT imaging of the head and neck was performed using the standard protocol during bolus administration of intravenous contrast. Multiplanar CT image reconstructions and MIPs were obtained to evaluate the vascular anatomy. Carotid stenosis measurements (when applicable) are obtained utilizing NASCET criteria, using the distal internal carotid diameter as the denominator. RADIATION DOSE REDUCTION: This exam was performed according to the departmental dose-optimization program which includes automated exposure control, adjustment of the mA and/or kV according to patient size and/or use of iterative reconstruction technique. CONTRAST:  75mL OMNIPAQUE  IOHEXOL  350 MG/ML SOLN COMPARISON:  Prior CT from earlier the same day. FINDINGS: CTA NECK FINDINGS Aortic arch: Visualized aortic arch within normal limits for caliber with standard  branch pattern. Aortic atherosclerosis. No visible stenosis about the origin the great vessels. Right carotid system: Right common and internal carotid arteries are patent without dissection. Calcified plaque about the proximal cervical right ICA without hemodynamically significant greater than 50% stenosis. Left carotid system: Left common and internal carotid arteries are patent without dissection. Mild atheromatous change about the left carotid bulb/proximal  cervical left ICA without hemodynamically significant greater than 50% stenosis. Vertebral arteries: Both vertebral arteries arise from subclavian arteries. Focal 75% stenosis present at the origin of the right subclavian artery. Atheromatous change about the origins of the vertebral arteries with severe stenosis on the right (series 5, image 268). Vertebral arteries otherwise patent without stenosis or dissection. Skeleton: No worrisome osseous lesions.  Patient is edentulous. Other neck: No other acute finding. Upper chest: No other acute finding. Review of the MIP images confirms the above findings CTA HEAD FINDINGS Anterior circulation: Atheromatous change about the carotid siphons without hemodynamically significant stenosis. A1 segments patent bilaterally. Normal anterior communicating complex. Anterior cerebral arteries patent without significant stenosis. No M1 stenosis or occlusion. No proximal MCA branch occlusion. Distal MCA branches perfused and symmetric. Posterior circulation: Left V4 segment dominant and widely patent without stenosis. Left PICA patent. Right vertebral artery widely patent to the takeoff of the right PICA of the largely occludes distally. Right PICA patent. Basilar patent without stenosis. Superior cerebellar arteries patent bilaterally. Both PCAs are patent to their distal aspects without hemodynamically significant stenosis. Venous sinuses: See below. Anatomic variants: As above.  No aneurysm. Review of the MIP images  confirms the above findings CT VENOGRAM FINDINGS Normal enhancement seen throughout the superior sagittal sinus to the torcula. Transverse and sigmoid sinuses are patent as are the jugular bulbs and proximal internal jugular veins. Straight sinus, vein of Galen, and internal cerebral veins are patent. No evidence for dural venous sinus thrombosis. No appreciable cortical vein abnormality. IMPRESSION: 1. Negative CTA for large vessel occlusion or other emergent finding. 2. Atheromatous change about the origin of the right vertebral artery with severe stenosis. Right V4 segment largely occludes beyond the takeoff of the right PICA. Dominant left vertebral artery widely patent. 3. 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 4. Atheromatous change about the carotid bifurcations and carotid siphons without hemodynamically significant stenosis. 5. Negative CT venogram. No evidence for dural venous sinus thrombosis. Aortic Atherosclerosis (ICD10-I70.0). Electronically Signed   By: Morene Hoard M.D.   On: 05/27/2024 01:28   CT HEAD CODE STROKE WO CONTRAST` Result Date: 05/27/2024 CLINICAL DATA:  Code stroke. Initial evaluation for acute neuro deficit, stroke suspected. EXAM: CT HEAD WITHOUT CONTRAST TECHNIQUE: Contiguous axial images were obtained from the base of the skull through the vertex without intravenous contrast. RADIATION DOSE REDUCTION: This exam was performed according to the departmental dose-optimization program which includes automated exposure control, adjustment of the mA and/or kV according to patient size and/or use of iterative reconstruction technique. COMPARISON:  Initial evaluation FINDINGS: Brain: Age-related cerebral atrophy with chronic microvascular ischemic disease. Chronic left cerebellar infarct. No acute intracranial hemorrhage. No acute large vessel territory infarct. No mass lesion or midline shift. No hydrocephalus or extra-axial fluid collection. Vascular: No  abnormal hyperdense vessel. Scattered vascular calcifications noted within the carotid siphons. Skull: Scalp soft tissues and calvarium demonstrate no new finding. Sinuses/Orbits: Globes orbital soft tissues within normal limits. Paranasal sinuses are largely clear. No significant mastoid effusion. Other: None. ASPECTS [facility] Stroke Program Early CT Score) - Ganglionic level infarction (caudate, lentiform nuclei, internal capsule, insula, M1-M3 cortex): 7 - Supraganglionic infarction (M4-M6 cortex): 3 Total score (0-10 with 10 being normal): 10 IMPRESSION: 1. Stable head CT.  No acute intracranial abnormality. 2. ASPECTS is 10. 3. Age-related cerebral atrophy with chronic small vessel ischemic disease, with chronic left cerebellar infarct. These results were communicated to Dr. Vanessa at 12:25 am on 05/27/2024 by  text page via the [facility] messaging system. Electronically Signed   By: Morene Hoard M.D.   On: 05/27/2024 00:25   DG Pelvis Portable Result Date: 05/26/2024 EXAM: 1 VIEW(S) XRAY OF THE PELVIS 05/26/2024 11:12:21 PM COMPARISON: None available. CLINICAL HISTORY: R index finger infx. FINDINGS: BONES AND JOINTS: Mild degenerative changes of the lower lumbar spine. No acute fracture. No focal osseous lesion. No joint dislocation. SOFT TISSUES: The soft tissues are unremarkable. IMPRESSION: 1. No acute abnormalities. Electronically signed by: Pinkie Pebbles MD 05/26/2024 11:21 PM EDT RP Workstation: [id number]   DG Hand Complete Left Result Date: 05/26/2024 EXAM: 3 or more VIEW(S) XRAY OF THE LEFT HAND 05/26/2024 11:12:21 PM COMPARISON: None available. CLINICAL HISTORY: R index finger infx. R index finger infx. FINDINGS: BONES AND JOINTS: Comminuted, intraarticular fracture involving the base of the second distal phalanx with mild ventral displacement of the dominant distal fracture fragment of the oblique view. Given overlying fragmentation, involvement of the middle phalanx is difficult to  exclude, but not favored. Mild multifocal degenerative changes. No joint dislocation. SOFT TISSUES: Associated soft tissue swelling with possible open wound. IMPRESSION: 1. Comminuted intra-articular fracture at the base of the second distal phalanx, as above. 2. Associated soft tissue swelling with possible open wound. Electronically signed by: Pinkie Pebbles MD 05/26/2024 11:21 PM EDT RP Workstation: [id number]   CT Cervical Spine Wo Contrast Result Date: 05/26/2024 CLINICAL DATA:  Neck trauma (Age >= 65y) EXAM: CT CERVICAL SPINE WITHOUT CONTRAST TECHNIQUE: Multidetector CT imaging of the cervical spine was performed without intravenous contrast. Multiplanar CT image reconstructions were also generated. RADIATION DOSE REDUCTION: This exam was performed according to the departmental dose-optimization program which includes automated exposure control, adjustment of the mA and/or kV according to patient size and/or use of iterative reconstruction technique. COMPARISON:  None Available. FINDINGS: Alignment: Normal Skull base and vertebrae: No acute fracture. No primary bone lesion or focal pathologic process. Soft tissues and spinal canal: No prevertebral fluid or swelling. No visible canal hematoma. Disc levels: Degenerative disc disease most pronounced at C5-6 with disc space narrowing and spurring. Moderate bilateral degenerative facet disease. Upper chest: No acute findings Other: None IMPRESSION: Multilevel degenerative changes.  No acute bony abnormality. Electronically Signed   By: Franky Crease M.D.   On: 05/26/2024 22:53   CT Head Wo Contrast Result Date: 05/26/2024 CLINICAL DATA:  Head trauma, minor (Age >= 65y) EXAM: CT HEAD WITHOUT CONTRAST TECHNIQUE: Contiguous axial images were obtained from the base of the skull through the vertex without intravenous contrast. RADIATION DOSE REDUCTION: This exam was performed according to the departmental dose-optimization program which includes automated  exposure control, adjustment of the mA and/or kV according to patient size and/or use of iterative reconstruction technique. COMPARISON:  04/21/2024 FINDINGS: Brain: There is atrophy and chronic small vessel disease changes. No acute intracranial abnormality. Specifically, no hemorrhage, hydrocephalus, mass lesion, acute infarction, or significant intracranial injury. Old left cerebellar infarct, unchanged. Vascular: No hyperdense vessel or unexpected calcification. Skull: No acute calvarial abnormality. Sinuses/Orbits: No acute findings Other: None IMPRESSION: Atrophy, chronic microvascular disease. No acute intracranial abnormality. Chronic left cerebellar infarct. Electronically Signed   By: Franky Crease M.D.   On: 05/26/2024 22:51   DG Chest Portable 1 View Result Date: 05/26/2024 CLINICAL DATA:  Question of pneumonia. Slight confusion. Patient fell tonight. EXAM: PORTABLE CHEST 1 VIEW COMPARISON:  04/22/2024 FINDINGS: Cardiac enlargement. No vascular congestion or edema. Patient positioning limits evaluation but there appears to be infiltration in the right lower  lung probably associated with some bronchiectasis. This could represent pneumonia or aspiration. Left lung is clear. No pleural effusion or pneumothorax. Mediastinal contours appear intact. Calcification of the aorta. Degenerative changes in the spine and shoulders. IMPRESSION: Cardiac enlargement. Infiltration suggested in the right lower lung with some bronchiectasis. This may be aspiration or pneumonia. Electronically Signed   By: Elsie Gravely M.D.   On: 05/26/2024 22:38    Assessment/Plan: AKI on CKD stage IIIb Hyperkalemia Multifactorial in the setting of finger infection, exposure to antibiotics, and increased doses of allopurinol .  This is further complicated by the fact that patient was also on glipizide worsening renal clearance, because of recurrent hypoglycemia.  UA without infection or cast, renal ultrasound without  hydronephrosis or bladder distention. She has trace edema in extremities.   -Holding metformin and glipizide -Hold allopurinol  -Avoid nephrotoxic medicine -Strict ins and outs - BMP as above   2.  Elevated anion gap metabolic acidosis Likely secondary to elevated BUN and renal failure.  She had mildly elevated lactic acid, which has since normalized.  Low suspicion for toxins, as acetaminophen  and salicylates are normal.  Yvonne Newton 05/27/2024, 9:00 AM

## 2024-05-27 NOTE — Code Documentation (Signed)
 Responded to Code Stroke called on pt in ED for fall/confusion. Pt admitted to ED at 2117. Around 0000, pt noted to be more altered with slurred speech, [id number]. Code Stroke called at 0003.  NIH-17, CT head negative for acute changes, CTA-no LVO. TNK not given-non focal exam. Once back in ED room, CBG checked>22, 1 amp D50 given, and pt mental status improved to what it was at ED arrival. Code Stroke cancelled at 0052.

## 2024-05-28 ENCOUNTER — Encounter (HOSPITAL_COMMUNITY)
Admission: EM | Disposition: A | Discharge status: Skilled Nursing Facility | Payer: Self-pay | Source: Home / Self Care | Attending: Internal Medicine

## 2024-05-28 ENCOUNTER — Encounter (HOSPITAL_COMMUNITY): Payer: Self-pay | Admitting: Internal Medicine

## 2024-05-28 ENCOUNTER — Inpatient Hospital Stay (HOSPITAL_COMMUNITY): Admitting: Anesthesiology

## 2024-05-28 ENCOUNTER — Other Ambulatory Visit: Payer: Self-pay

## 2024-05-28 DIAGNOSIS — I110 Hypertensive heart disease with heart failure: Secondary | ICD-10-CM | POA: Diagnosis not present

## 2024-05-28 DIAGNOSIS — N179 Acute kidney failure, unspecified: Secondary | ICD-10-CM | POA: Diagnosis not present

## 2024-05-28 DIAGNOSIS — I2510 Atherosclerotic heart disease of native coronary artery without angina pectoris: Secondary | ICD-10-CM | POA: Diagnosis not present

## 2024-05-28 DIAGNOSIS — M65142 Other infective (teno)synovitis, left hand: Secondary | ICD-10-CM | POA: Diagnosis present

## 2024-05-28 DIAGNOSIS — I5033 Acute on chronic diastolic (congestive) heart failure: Secondary | ICD-10-CM

## 2024-05-28 DIAGNOSIS — N189 Chronic kidney disease, unspecified: Secondary | ICD-10-CM | POA: Diagnosis not present

## 2024-05-28 DIAGNOSIS — S61201A Unspecified open wound of left index finger without damage to nail, initial encounter: Secondary | ICD-10-CM | POA: Diagnosis not present

## 2024-05-28 DIAGNOSIS — M869 Osteomyelitis, unspecified: Secondary | ICD-10-CM | POA: Diagnosis present

## 2024-05-28 HISTORY — PX: INCISION AND DRAINAGE OF WOUND: SHX1803

## 2024-05-28 HISTORY — PX: AMPUTATION FINGER: SHX6594

## 2024-05-28 LAB — GLUCOSE, CAPILLARY
Glucose-Capillary: 103 mg/dL — ABNORMAL HIGH (ref 70–99)
Glucose-Capillary: 294 mg/dL — ABNORMAL HIGH (ref 70–99)
Glucose-Capillary: 320 mg/dL — ABNORMAL HIGH (ref 70–99)
Glucose-Capillary: 72 mg/dL (ref 70–99)
Glucose-Capillary: 82 mg/dL (ref 70–99)
Glucose-Capillary: 86 mg/dL (ref 70–99)
Glucose-Capillary: 86 mg/dL (ref 70–99)
Glucose-Capillary: 87 mg/dL (ref 70–99)
Glucose-Capillary: 89 mg/dL (ref 70–99)
Glucose-Capillary: 91 mg/dL (ref 70–99)
Glucose-Capillary: 93 mg/dL (ref 70–99)
Glucose-Capillary: 93 mg/dL (ref 70–99)
Glucose-Capillary: 98 mg/dL (ref 70–99)

## 2024-05-28 LAB — BASIC METABOLIC PANEL WITH GFR
Anion gap: 13 (ref 5–15)
BUN: 57 mg/dL — ABNORMAL HIGH (ref 8–23)
CO2: 18 mmol/L — ABNORMAL LOW (ref 22–32)
Calcium: 8.4 mg/dL — ABNORMAL LOW (ref 8.9–10.3)
Chloride: 104 mmol/L (ref 98–111)
Creatinine, Ser: 4.76 mg/dL — ABNORMAL HIGH (ref 0.44–1.00)
GFR, Estimated: 9 mL/min — ABNORMAL LOW (ref >60)
Glucose, Bld: 82 mg/dL (ref 70–99)
Potassium: 4.3 mmol/L (ref 3.5–5.1)
Sodium: 135 mmol/L (ref 135–145)

## 2024-05-28 LAB — CBC
HCT: 27.8 % — ABNORMAL LOW (ref 36.0–46.0)
Hemoglobin: 9.1 g/dL — ABNORMAL LOW (ref 12.0–15.0)
MCH: 29.6 pg (ref 26.0–34.0)
MCHC: 32.7 g/dL (ref 30.0–36.0)
MCV: 90.6 fL (ref 80.0–100.0)
Platelets: 193 K/uL (ref 150–400)
RBC: 3.07 MIL/uL — ABNORMAL LOW (ref 3.87–5.11)
RDW: 15.8 % — ABNORMAL HIGH (ref 11.5–15.5)
WBC: 7.4 K/uL (ref 4.0–10.5)
nRBC: 0 % (ref 0.0–0.2)

## 2024-05-28 LAB — HEMOGLOBIN A1C
Hgb A1c MFr Bld: 5.4 % (ref 4.8–5.6)
Mean Plasma Glucose: 108.28 mg/dL

## 2024-05-28 LAB — MRSA NEXT GEN BY PCR, NASAL: MRSA by PCR Next Gen: NOT DETECTED

## 2024-05-28 SURGERY — IRRIGATION AND DEBRIDEMENT WOUND
Anesthesia: General | Laterality: Left

## 2024-05-28 MED ORDER — SODIUM CHLORIDE 0.9 % IV SOLN: INTRAVENOUS | Status: DC

## 2024-05-28 MED ORDER — LIDOCAINE HCL 1 % IJ SOLN
INTRAMUSCULAR | Status: AC
Start: 2024-05-28 — End: 2024-05-28
  Filled 2024-05-28: qty 20

## 2024-05-28 MED ORDER — LIDOCAINE HCL (PF) 1 % IJ SOLN
INTRAMUSCULAR | Status: DC | PRN
  Administered 2024-05-28: 13 mL

## 2024-05-28 MED ORDER — BUPIVACAINE HCL (PF) 0.25 % IJ SOLN
INTRAMUSCULAR | Status: DC | PRN
  Administered 2024-05-28: 10 mL

## 2024-05-28 MED ORDER — CARVEDILOL 12.5 MG PO TABS
12.5000 mg | ORAL_TABLET | Freq: Two times a day (BID) | ORAL | Status: DC
  Administered 2024-05-29 – 2024-06-05 (×14): 12.5 mg via ORAL
  Filled 2024-05-28 (×16): qty 1

## 2024-05-28 MED ORDER — FENTANYL CITRATE (PF) 250 MCG/5ML IJ SOLN
INTRAMUSCULAR | Status: AC
  Filled 2024-05-28: qty 5

## 2024-05-28 MED ORDER — OXYCODONE HCL 5 MG/5ML PO SOLN: 5.0000 mg | Freq: Once | ORAL | Status: DC | PRN

## 2024-05-28 MED ORDER — PHENYLEPHRINE HCL-NACL 20-0.9 MG/250ML-% IV SOLN
INTRAVENOUS | Status: DC | PRN
  Administered 2024-05-28: 50 ug/min via INTRAVENOUS

## 2024-05-28 MED ORDER — INSULIN ASPART 100 UNIT/ML IJ SOLN
0.0000 [IU] | Freq: Three times a day (TID) | INTRAMUSCULAR | Status: DC
  Administered 2024-06-04: 1 [IU] via SUBCUTANEOUS

## 2024-05-28 MED ORDER — STERILE WATER FOR INJECTION IJ SOLN
INTRAMUSCULAR | Status: AC
  Filled 2024-05-28: qty 10

## 2024-05-28 MED ORDER — BUPIVACAINE HCL (PF) 0.25 % IJ SOLN
INTRAMUSCULAR | Status: AC
  Filled 2024-05-28: qty 10

## 2024-05-28 MED ORDER — ORAL CARE MOUTH RINSE
15.0000 mL | Freq: Once | OROMUCOSAL | Status: AC
  Administered 2024-05-28: 15 mL via OROMUCOSAL

## 2024-05-28 MED ORDER — SODIUM CHLORIDE 0.9 % IR SOLN
Status: DC | PRN
  Administered 2024-05-28: 3000 mL

## 2024-05-28 MED ORDER — FENTANYL CITRATE (PF) 250 MCG/5ML IJ SOLN
INTRAMUSCULAR | Status: DC | PRN
  Administered 2024-05-28: 50 ug via INTRAVENOUS
  Administered 2024-05-28 (×2): 25 ug via INTRAVENOUS

## 2024-05-28 MED ORDER — INSULIN ASPART 100 UNIT/ML IJ SOLN: 0.0000 [IU] | INTRAMUSCULAR | Status: DC | PRN

## 2024-05-28 MED ORDER — OXYCODONE HCL 5 MG PO TABS: 5.0000 mg | ORAL_TABLET | Freq: Once | ORAL | Status: DC | PRN

## 2024-05-28 MED ORDER — PHENTOLAMINE MESYLATE 5 MG IJ SOLR
5.0000 mg | Freq: Once | INTRAMUSCULAR | Status: AC
  Administered 2024-05-28: 5 mg via SUBCUTANEOUS
  Filled 2024-05-28: qty 5

## 2024-05-28 MED ORDER — FENTANYL CITRATE (PF) 100 MCG/2ML IJ SOLN: 25.0000 ug | INTRAMUSCULAR | Status: DC | PRN

## 2024-05-28 MED ORDER — PROPOFOL 500 MG/50ML IV EMUL
INTRAVENOUS | Status: DC | PRN
  Administered 2024-05-28: 25 ug/kg/min via INTRAVENOUS

## 2024-05-28 MED ORDER — BUPIVACAINE HCL (PF) 0.25 % IJ SOLN
INTRAMUSCULAR | Status: AC
  Filled 2024-05-28: qty 30

## 2024-05-28 MED ORDER — ACETAMINOPHEN 10 MG/ML IV SOLN
INTRAVENOUS | Status: AC
  Filled 2024-05-28: qty 100

## 2024-05-28 MED ORDER — PHENYLEPHRINE 80 MCG/ML (10ML) SYRINGE FOR IV PUSH (FOR BLOOD PRESSURE SUPPORT)
PREFILLED_SYRINGE | INTRAVENOUS | Status: DC | PRN
  Administered 2024-05-28: 80 ug via INTRAVENOUS
  Administered 2024-05-28 (×3): 160 ug via INTRAVENOUS

## 2024-05-28 MED ORDER — DEXAMETHASONE SODIUM PHOSPHATE 10 MG/ML IJ SOLN
INTRAMUSCULAR | Status: DC | PRN
  Administered 2024-05-28 (×2): 5 mg via INTRAVENOUS

## 2024-05-28 MED ORDER — LIDOCAINE 2% (20 MG/ML) 5 ML SYRINGE
INTRAMUSCULAR | Status: DC | PRN
  Administered 2024-05-28: 60 mg via INTRAVENOUS

## 2024-05-28 MED ORDER — LACTATED RINGERS IV SOLN: INTRAVENOUS | Status: DC

## 2024-05-28 MED ORDER — ACETAMINOPHEN 500 MG PO TABS: 1000.0000 mg | ORAL_TABLET | Freq: Once | ORAL | Status: DC

## 2024-05-28 MED ORDER — 0.9 % SODIUM CHLORIDE (POUR BTL) OPTIME
TOPICAL | Status: DC | PRN
  Administered 2024-05-28: 1000 mL

## 2024-05-28 MED ORDER — ACETAMINOPHEN 10 MG/ML IV SOLN
INTRAVENOUS | Status: DC | PRN
  Administered 2024-05-28: 1000 mg via INTRAVENOUS

## 2024-05-28 MED ORDER — ONDANSETRON HCL 4 MG/2ML IJ SOLN
INTRAMUSCULAR | Status: DC | PRN
  Administered 2024-05-28: 4 mg via INTRAVENOUS

## 2024-05-28 MED ORDER — CHLORHEXIDINE GLUCONATE 0.12 % MT SOLN: 15.0000 mL | Freq: Once | OROMUCOSAL | Status: AC

## 2024-05-28 MED ORDER — EPHEDRINE SULFATE-NACL 50-0.9 MG/10ML-% IV SOSY
PREFILLED_SYRINGE | INTRAVENOUS | Status: DC | PRN
  Administered 2024-05-28 (×3): 5 mg via INTRAVENOUS

## 2024-05-28 MED ORDER — LIDOCAINE HCL (PF) 1 % IJ SOLN
INTRAMUSCULAR | Status: AC
  Filled 2024-05-28: qty 5

## 2024-05-28 MED ORDER — NITROGLYCERIN 2 % TD OINT
1.0000 [in_us] | TOPICAL_OINTMENT | Freq: Three times a day (TID) | TRANSDERMAL | Status: DC
  Administered 2024-05-28: 1 [in_us] via TOPICAL
  Filled 2024-05-28: qty 30

## 2024-05-28 MED ORDER — ONDANSETRON HCL 4 MG/2ML IJ SOLN: 4.0000 mg | Freq: Once | INTRAMUSCULAR | Status: DC | PRN

## 2024-05-28 MED ORDER — PROPOFOL 10 MG/ML IV BOLUS
INTRAVENOUS | Status: DC | PRN
  Administered 2024-05-28: 100 mg via INTRAVENOUS
  Administered 2024-05-28: 30 mg via INTRAVENOUS
  Administered 2024-05-28: 50 mg via INTRAVENOUS

## 2024-05-28 MED ORDER — LIDOCAINE HCL (PF) 1 % IJ SOLN
INTRAMUSCULAR | Status: DC | PRN
  Administered 2024-05-28: 10 mL

## 2024-05-28 MED ORDER — PHENYLEPHRINE HCL-NACL 20-0.9 MG/250ML-% IV SOLN: INTRAVENOUS | Status: DC | PRN

## 2024-05-28 MED ORDER — DILTIAZEM HCL ER COATED BEADS 120 MG PO CP24
120.0000 mg | ORAL_CAPSULE | Freq: Every day | ORAL | Status: DC
Start: 2024-05-28 — End: 2024-06-03
  Administered 2024-05-29 – 2024-06-03 (×6): 120 mg via ORAL
  Filled 2024-05-28 (×6): qty 1

## 2024-05-28 MED ORDER — INSULIN ASPART 100 UNIT/ML IJ SOLN
0.0000 [IU] | Freq: Every day | INTRAMUSCULAR | Status: DC
  Administered 2024-05-28: 4 [IU] via SUBCUTANEOUS

## 2024-05-28 SURGICAL SUPPLY — 40 items
BAG COUNTER SPONGE SURGICOUNT (BAG) ×1 IMPLANT
BLADE SURG 15 STRL SS SAFETY (BLADE) IMPLANT
BNDG COMPR ESMARK 4X3 LF (GAUZE/BANDAGES/DRESSINGS) ×1 IMPLANT
BNDG ELASTIC 3X5.8 VLCR NS LF (GAUZE/BANDAGES/DRESSINGS) IMPLANT
BNDG ELASTIC 4X5.8 VLCR NS LF (GAUZE/BANDAGES/DRESSINGS) IMPLANT
CNTNR URN SCR LID CUP LEK RST (MISCELLANEOUS) IMPLANT
CORD BIPOLAR FORCEPS 12FT (ELECTRODE) ×1 IMPLANT
COVER SURGICAL LIGHT HANDLE (MISCELLANEOUS) ×2 IMPLANT
CUFF TOURN SGL QUICK 18X4 (TOURNIQUET CUFF) ×1 IMPLANT
DRAPE SURG 17X23 STRL (DRAPES) ×1 IMPLANT
GAUZE SPONGE 4X4 12PLY STRL (GAUZE/BANDAGES/DRESSINGS) IMPLANT
GAUZE XEROFORM 1X8 LF (GAUZE/BANDAGES/DRESSINGS) IMPLANT
GLOVE SURG SYN 7.5 PF PI (GLOVE) IMPLANT
GOWN STRL REUS W/ TWL LRG LVL3 (GOWN DISPOSABLE) ×1 IMPLANT
KIT BASIN OR (CUSTOM PROCEDURE TRAY) ×1 IMPLANT
KIT TURNOVER KIT B (KITS) ×1 IMPLANT
MANIFOLD NEPTUNE II (INSTRUMENTS) ×1 IMPLANT
NDL HYPO 25GX1X1/2 BEV (NEEDLE) IMPLANT
NEEDLE HYPO 25GX1X1/2 BEV (NEEDLE) ×3 IMPLANT
PACK ORTHO EXTREMITY (CUSTOM PROCEDURE TRAY) ×1 IMPLANT
PAD ARMBOARD POSITIONER FOAM (MISCELLANEOUS) ×2 IMPLANT
PAD CAST 3X4 CTTN HI CHSV (CAST SUPPLIES) IMPLANT
PAD CAST 4YDX4 CTTN HI CHSV (CAST SUPPLIES) IMPLANT
SET CYSTO W/LG BORE CLAMP LF (SET/KITS/TRAYS/PACK) IMPLANT
SOLN 0.9% NACL 1000 ML (IV SOLUTION) ×1 IMPLANT
SOLN 0.9% NACL POUR BTL 1000ML (IV SOLUTION) ×1 IMPLANT
SOLN STERILE WATER 1000 ML (IV SOLUTION) ×1 IMPLANT
SOLN STERILE WATER BTL 1000 ML (IV SOLUTION) ×1 IMPLANT
SPIKE FLUID TRANSFER (MISCELLANEOUS) ×1 IMPLANT
SPLINT PLASTER CAST FAST 5X30 (CAST SUPPLIES) IMPLANT
SUCTION TUBE FRAZIER 10FR DISP (SUCTIONS) IMPLANT
SUT ETHILON 5 0 PS 2 18 (SUTURE) IMPLANT
SWAB COLLECTION DEVICE MRSA (MISCELLANEOUS) ×1 IMPLANT
SWAB CULTURE ESWAB REG 1ML (MISCELLANEOUS) IMPLANT
SYR 20ML LL LF (SYRINGE) IMPLANT
SYR CONTROL 10ML LL (SYRINGE) ×1 IMPLANT
TOWEL GREEN STERILE FF (TOWEL DISPOSABLE) ×1 IMPLANT
TUBE CONNECTING 12X1/4 (SUCTIONS) ×1 IMPLANT
UNDERPAD 30X36 HEAVY ABSORB (UNDERPADS AND DIAPERS) ×1 IMPLANT
YANKAUER SUCT BULB TIP NO VENT (SUCTIONS) ×1 IMPLANT

## 2024-05-28 NOTE — Op Note (Addendum)
 NAME: Yvonne Newton MEDICAL RECORD NO: [id number] DATE OF BIRTH: [birth year] FACILITY: [facility] LOCATION: [facility] OR PHYSICIAN: MARSA EMERSON CHRISTEN MD   OPERATIVE REPORT   DATE OF PROCEDURE: 05/28/24    PREOPERATIVE DIAGNOSIS:  Left index finger wound with underlying bony erosion vs fracture, possible pyogenic flexor tenosynovitis   POSTOPERATIVE DIAGNOSIS:  Left index finger wound with underlying bony erosion vs fracture and pyogenic flexor tenosynovitis   PROCEDURE:   I&D of left index finger flexor tendon sheath down into the palm CPT 26020 Release left carpal tunnel CPT 64721 Partial amputation of the left index finger CPT 26951    SURGEON: MARSA HERO. Skiler Olden, M.D.   ASSISTANT: none   ANESTHESIA:  General and Local   INTRAVENOUS FLUIDS:  Per anesthesia flow sheet.   ESTIMATED BLOOD LOSS:  Minimal.   COMPLICATIONS:  None.   SPECIMENS:  Multiple cultures and pathology specimens were collected.   TOURNIQUET TIME:    Total Tourniquet Time Documented: Upper Arm (Left) - 55 minutes Total: Upper Arm (Left) - 55 minutes    DISPOSITION:  Stable to PACU.   INDICATIONS:  83 year old female with left index finger gouty tophus that eroded through the skin and possibly to the DIP joint.  Developed swelling and erythema concerning for infection.  OPERATIVE COURSE:  Patient was identified in holding and site, side, and surgery were confirmed.  The operative site was marked.  The patient was brought back to the operating room and transferred to the OR table with the operative extremity outstretched on the hand table.  General anesthesia was induced by the anesthesia team.  Patient was prepped and draped in typical sterile fashion.  A timeout was performed.  The operative extremity was held in the air for gravity exsanguination.  Next the Esmarch bandage was used proximal to the wrist to further exsanguinate the limb.  Tourniquet was then inflated to 250 mmHg.  The  distal wound was gently explored and found to go down to bone.  An incision was made along the palmar flexion crease over the index finger MCP joint.  The soft tissues were dissected down bluntly to the tendon sheath and the tendon sheath was opened at the level of the A1 pulley to evaluate for flexor tenosynovitis.  Purulence was present. The incision was extended proximally in a [place] fashion.  Next, clean instruments were used to open the carpal tunnel to ensure no purulence was found there.  The carpal tunnel was released and no purulence was encountered.    With the large dorsal soft tissue defect and eroded and infected bone, the decision was made to amputate the distal aspect of the index finger.  A fishmouth incision with a longer volar flap was made and taken down to bone.  The bone of the middle phalanx was rongeured to complete the amputation and the specimen was passed off to be sent to pathology.    A 14-gauge Angiocath was placed in the tendon sheath at the level of the MCP joint and sterile normal saline was injected through the tendon sheath in an anterograde fashion to thoroughly wash out the tendon sheath.   Next the incision sites, starting proximally at the carpal tunnel and moving to the finger, were thoroughly irrigated with 3L normal saline and through cysto tubing.    New instruments were then used and gloves were changed.  A small sample of the remaining middle phalanx was taken for cultures/path.  The tourniquet was deflated and hemostasis  achieved with bipolar.  The incisions were closed with 5-0 nylon sutures.  A 50-50 mix of 1% plain lidocaine  and quarter percent plain bupivacaine were used for local anesthesia.  Xeroform was placed over the wounds and a bulky soft dressing was placed consisting of gauze, and webril followed by a volar resting splint.  All counts were correct.  The patient was taken to PACU without issue.   POST OP PLAN: Plan for continued admission  for IV antibiotics.  Recommend IV antibiotics and possibly until cultures return.  Patient will be non-weightbearing in splint.    MARSA CHRISTELLA CHRISTEN, MD Electronically signed, 05/28/24

## 2024-05-28 NOTE — Progress Notes (Signed)
 PT Cancellation Note  Patient Details Name: Zoii Florer MRN: [id number] DOB: 02-Jul-1941   Cancelled Treatment:    Reason Eval/Treat Not Completed: Patient at procedure or test/unavailable (Pt off floor for left index finger I&D. Will follow-up for PT evaluation as schedule permits.)  Randall SAUNDERS, PT, DPT Acute Rehabilitation Services Office: [phone] Secure Chat Preferred  Delon CHRISTELLA Callander 05/28/2024, 9:03 AM

## 2024-05-28 NOTE — Progress Notes (Addendum)
 During handoff, RNs noticed that the patient had completely removed the splint/dressing off of her left hand s/p I&D, amputation of index finger.  Site was redressed with gauze. Mitts applied. Patient very confused.  Notified on call Chiaramonti, MD

## 2024-05-28 NOTE — Anesthesia Procedure Notes (Signed)
 Procedure Name: LMA Insertion Date/Time: 05/28/2024 10:28 AM  Performed by: Mollie Olivia SAUNDERS, CRNAPre-anesthesia Checklist: Patient identified, Emergency Drugs available, Suction available and Patient being monitored Patient Re-evaluated:Patient Re-evaluated prior to induction Oxygen Delivery Method: Circle System Utilized Preoxygenation: Pre-oxygenation with 100% oxygen Induction Type: IV induction Ventilation: Mask ventilation without difficulty LMA: LMA inserted LMA Size: 4.0 Number of attempts: 1 Airway Equipment and Method: Bite block Placement Confirmation: positive ETCO2 Tube secured with: Tape Dental Injury: Teeth and Oropharynx as per pre-operative assessment

## 2024-05-28 NOTE — Anesthesia Preprocedure Evaluation (Addendum)
 Anesthesia Evaluation  Patient identified by MRN, date of birth, ID band Patient confused  General Assessment Comment: AAOx2   Reviewed: Allergy & Precautions, NPO status , Patient's Chart, lab work & pertinent test results, reviewed documented beta blocker date and time   History of Anesthesia Complications Negative for: history of anesthetic complications  Airway Mallampati: II  TM Distance: >3 FB Neck ROM: Full    Dental  (+) Edentulous Upper, Edentulous Lower   Pulmonary former smoker   Pulmonary exam normal        Cardiovascular hypertension, Pt. on medications and Pt. on home beta blockers + CAD and + Past MI  Normal cardiovascular exam   '23 Cath -   Mid RCA lesion is 30% stenosed.   1st Mrg lesion is 30% stenosed.   Prox LAD to Mid LAD lesion is 20% stenosed.   2nd Diag lesion is 30% stenosed.   Mid LAD lesion is 60% stenosed.   There is mild left ventricular systolic dysfunction.   LV end diastolic pressure is moderately elevated.   The left ventricular ejection fraction is 45-50% by visual estimate.  '23 TTE - EF 50 to 55%. Hypokinesis of the distal anteroseptal and apical region. Grade I diastolic dysfunction (impaired relaxation). Aortic valve regurgitation is mild.      Neuro/Psych  PSYCHIATRIC DISORDERS     Dementia TIA   GI/Hepatic Neg liver ROS,GERD  Medicated and Controlled,,  Endo/Other  diabetes, Type 2, Oral Hypoglycemic Agents    Renal/GU ARF and CRFRenal disease     Musculoskeletal  (+) Arthritis ,   Gout    Abdominal   Peds  Hematology  (+) Blood dyscrasia, anemia   Anesthesia Other Findings   Reproductive/Obstetrics                              Anesthesia Physical Anesthesia Plan  ASA: 4  Anesthesia Plan: General   Post-op Pain Management: Tylenol  PO (pre-op)*   Induction: Intravenous  PONV Risk Score and Plan: 3 and Treatment may vary  due to age or medical condition, Ondansetron  and TIVA  Airway Management Planned: LMA  Additional Equipment: None  Intra-op Plan:   Post-operative Plan: Extubation in OR  Informed Consent: I have reviewed the patients History and Physical, chart, labs and discussed the procedure including the risks, benefits and alternatives for the proposed anesthesia with the patient or authorized representative who has indicated his/her understanding and acceptance.     Dental advisory given and Consent reviewed with POA  Plan Discussed with: CRNA and Anesthesiologist  Anesthesia Plan Comments:          Anesthesia Quick Evaluation

## 2024-05-28 NOTE — Anesthesia Postprocedure Evaluation (Signed)
 Anesthesia Post Note  Patient: Yvonne Newton  Procedure(s) Performed: IRRIGATION AND DEBRIDEMENT WOUND (Left) AMPUTATION, FINGER (Left)     Patient location during evaluation: PACU Anesthesia Type: General Level of consciousness: awake and confused Pain management: pain level controlled Vital Signs Assessment: post-procedure vital signs reviewed and stable Respiratory status: spontaneous breathing, nonlabored ventilation, respiratory function stable and patient connected to nasal cannula oxygen Cardiovascular status: blood pressure returned to baseline and stable Postop Assessment: no apparent nausea or vomiting Anesthetic complications: no   No notable events documented.  Last Vitals:  Vitals:   05/28/24 1345 05/28/24 1523  BP: (!) 155/62 (!) 149/57  Pulse: 68   Resp: 13   Temp: 36.8 C (!) 36.2 C  SpO2: 99% 99%    Last Pain:  Vitals:   05/28/24 1523  TempSrc: Axillary  PainSc:                  Debby FORBES Like

## 2024-05-28 NOTE — Progress Notes (Signed)
 PROGRESS NOTE  Cindi Ghazarian  DOB: December 06, 1940  PCP: [facility] FMW:[id number]  DOA: 05/26/2024  LOS: 1 day  Hospital Day: 3  Brief narrative: Collene Massimino Appelhans is a 83 y.o. female with PMH significant for dementia DM2, HTN, HLD, CHF, stroke, vitamin B12 deficiency, osteoarthritis 9/25, presented to ED from home after a fall. Per report, patient's dementia has been progressively worsening.  For the last 2 days at home, patient has not been eating or drinking, was more somnolent and also having hallucinations.  On 9/25, patient stood up and fell and is brought to ED.   In the ED, hemodynamically stable, blood glucose level was 89 Initially seen in the ED as code stroke CT head and cervical spine which did not note any acute intracranial or bony cervical abnormality.  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 Labs significant for potassium of 5.6, creatinine of 6.36, lactic acid 2.8 Chest x-ray showed cardiac enlargement with infiltration in right lower lobe lung with some bronchiectasis.  X-rays of the left hand showed communicated intra-articular fracture at the base of the second distal phalanx and associated soft tissue swelling with possible open wound.   Urinalysis noted small amount of hemoglobin, trace leukocytes, few bacteria, and 11-20 squamous epithelial cells/hpf.   Patient was given IV fluid Admitted to [facility]  Subjective: Patient was seen and examined this afternoon. Pleasant elderly Caucasian female.  Somnolent since back from PACU. Family not at bedside. Chart reviewed Afebrile, hemodynamically stable  Assessment and plan: AKI on CKD 3B  Acute metabolic acidosis Lactic acidosis Baseline creatinine 1.86 from August.  Presented with creatinine elevated to 6.36, lactic acid at 2.8 and bicarb low at 16 Likely prerenal secondary to poor  intake. Nephrology consulted Creatinine slightly improving with IV hydration.  4.76 this morning.   Repeat labs tomorrow Continue to monitor Recent Labs    07/11/23 0203 04/21/24 1800 05/26/24 2132 05/27/24 0054 05/27/24 1107 05/28/24 0349  BUN 46* 31* 72* 70* 64* 57*  CREATININE 2.27* 1.86* 6.36* 5.90* 5.58* 4.76*  CO2 25 26 16*  --  17* 18*   Recent Labs  Lab 05/26/24 2136 05/26/24 2346  LATICACIDVEN 2.8* 1.6    Communicated fracture of second distal phalanx Patient presented with swelling and drainage of the left index finger.   Per report, recently started on antibiotics with concern for cellulitis.   X-rays on admission revealed communicated intra-articular fracture at the base of the second distal phalanx and associated soft tissue swelling with possible open wound.  Hand surgery was consulted. This morning, underwent I&D of the left index finger flexor tendon sheath, release of left carpal tunnel and partial amputation of left index finger Currently on IV Unasyn   Acute metabolic encephalopathy Underlying dementia At baseline, disoriented.   Lately having hallucinations likely due to uremia,  Brain imaging without acute stroke PTA meds- risperidone, Zoloft  Currently continued on Zoloft .  Not sure if she is compliant to risperidone. Delirium precautions  Fall/syncope and collapse Patient was reported to have fallen/passed out after standing up from bed.   Suspect likely related to orthostatic hypotension given soft blood pressures. Check orthostatic vital signs once able PT to eval and treat  Type 2 diabetes mellitus Hyperglycemia A1c 6.3 from 2023.  Pending repeat A1c PTA meds-metformin glipizide.  Currently on hold Because of hypoglycemia, patient was placed on D10 drip at 100 mL/h.  I will continue the same for the next 24 hours Lab Results  Component Value Date   HGBA1C 6.3 (H) 07/11/2022   Recent Labs  Lab 05/28/24 0614 05/28/24 0745 05/28/24 0851  05/28/24 0959 05/28/24 1238  GLUCAP 98 91 93 87 93   Essential hypertension Blood pressures noted to be soft.   PTA meds- Coreg , Cardizem , Lasix , losartan  Currently on hold Blood pressure running elevated this afternoon Resume carvedilol  12.5 mg twice daily and Cardizem  120 mg daily.  Continue to hold Lasix  and losartan   HLD PTA meds- aspirin , statin Aspirin  on hold.  Continue Crestor   Mild chronic anemia GERD Hemoglobin baseline between 9 and 10.  Continue to monitor. PPI Recent Labs    04/21/24 1800 05/26/24 2132 05/27/24 0054 05/27/24 1107 05/28/24 0349  HGB 9.9* 10.2* 8.8* 10.0* 9.1*  MCV 92.4 93.0  --  94.3 90.6    Gout Continue allopurinol  and colchicine     Mobility:  PT Orders: Active   PT Follow up Rec:    Goals of care   Code Status: Full Code     DVT prophylaxis:  heparin  injection 5,000 Units Start: 05/27/24 2200 SCDs Start: 05/27/24 9073   Antimicrobials: IV Unasyn  Fluid: D10 at 100 mL/h Consultants: Orthopedics, nephrology Family Communication: None at bedside  Status: Inpatient Level of care:  Progressive   Patient is from: Home Needs to continue in-hospital care: POD 0, pending renal recovery Anticipated d/c to: Pending clinical course     Diet:  Diet Order             Diet NPO time specified  Diet effective midnight                   Scheduled Meds:  allopurinol   300 mg Oral Daily   carvedilol   12.5 mg Oral BID WC   colchicine   0.3 mg Oral Daily   diltiazem   120 mg Oral Daily   heparin  injection (subcutaneous)  5,000 Units Subcutaneous Q8H   pantoprazole   20 mg Oral Daily   rosuvastatin   5 mg Oral q1800   sertraline   25 mg Oral Daily   sodium chloride  flush  3 mL Intravenous Q12H    PRN meds: acetaminophen  **OR** acetaminophen , albuterol , dextrose    Infusions:   ampicillin -sulbactam (UNASYN ) IV 3 g (05/28/24 0542)   dextrose  100 mL/hr at 05/28/24 1021    Antimicrobials: Anti-infectives (From admission,  onward)    Start     Dose/Rate Route Frequency Ordered Stop   05/28/24 0600  Ampicillin -Sulbactam (UNASYN ) 3 g in sodium chloride  0.9 % 100 mL IVPB        3 g 200 mL/hr over 30 Minutes Intravenous Every 24 hours 05/27/24 0901     05/26/24 2345  Ampicillin -Sulbactam (UNASYN ) 3 g in sodium chloride  0.9 % 100 mL IVPB        3 g 200 mL/hr over 30 Minutes Intravenous  Once 05/26/24 2333 05/27/24 0407       Objective: Vitals:   05/28/24 1330 05/28/24 1345  BP: (!) 143/50 (!) 155/62  Pulse: 68 68  Resp: 14 13  Temp:  98.3 F (36.8 C)  SpO2: 95% 99%    Intake/Output Summary (Last 24 hours) at 05/28/2024 1519 Last data filed at 05/28/2024 1245 Gross per 24 hour  Intake 3198.33 ml  Output 122 ml  Net 3076.33 ml   Filed Weights   05/26/24 2126 05/28/24 0903  Weight: 70.8 kg 70.8 kg   Weight change:  Body mass index is  25.19 kg/m.   Physical Exam: General exam: Pleasant, elderly Caucasian female.  Not in physical distress Skin: No rashes, lesions or ulcers. HEENT: Atraumatic, normocephalic, no obvious bleeding Lungs: Clear to auscultation bilaterally,  CVS: S1, S2, no murmur,   GI/Abd: Soft, nontender, nondistended, bowel sound present,   CNS: Somnolent since arrival from surgery.  Tries to open eyes on touch Extremities: No pedal edema, no calf tenderness, left upper extremity on wrap bandage  Data Review: I have personally reviewed the laboratory data and studies available.  F/u labs ordered Unresulted Labs (From admission, onward)     Start     Ordered   05/29/24 0500  Basic metabolic panel with GFR  Tomorrow morning,   R        05/28/24 1517   05/29/24 0500  CBC with Differential/Platelet  Tomorrow morning,   R        05/28/24 1517   05/28/24 1518  Hemoglobin A1c  Add-on,   AD        05/28/24 1517   05/28/24 1146  Aerobic/Anaerobic Culture w Gram Stain (surgical/deep wound)  RELEASE UPON ORDERING,   TIMED       Comments: Specimen C: Pre-op diagnosis: LEFT INDEX  FINGER WOUND    05/28/24 1146   05/28/24 1126  Aerobic/Anaerobic Culture w Gram Stain (surgical/deep wound)  RELEASE UPON ORDERING,   TIMED       Comments: Specimen A: Cell Count     05/28/24 1126   05/28/24 1126  Aerobic/Anaerobic Culture w Gram Stain (surgical/deep wound)  RELEASE UPON ORDERING,   TIMED       Comments: Specimen B: Pre-op diagnosis: LEFT INDEX FINGER WOUND    05/28/24 1126   05/27/24 0926  Occult blood card to lab, stool  ONCE - STAT,   STAT        05/27/24 0947            Signed, Chapman Rota, MD [facility] 05/28/2024

## 2024-05-28 NOTE — Progress Notes (Signed)
 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   Past Medical History: Past Medical History:  Diagnosis Date   Arthritis 07/22/2017   Cerebellar stroke (HCC)    CHF (congestive heart failure) (HCC)    Diabetes mellitus type 2 in nonobese (HCC)    DM (diabetes mellitus) (HCC)    Dysarthria    Essential hypertension 07/22/2017   Hyperlipidemia, unspecified 07/22/2017   OA (osteoarthritis) of knee    right   Obesity    Stroke (HCC)    Tachycardia 07/22/2017   VENTRICULAR TACHYCARDIA 09/05/2009   Qualifier: Diagnosis of  By: Lawernce CMA, Jewel     Ventricular tachycardia (HCC)    Vitamin B 12 deficiency      Medications: Scheduled Meds:  acetaminophen   1,000 mg Oral Once   [MAR Hold] allopurinol   300 mg Oral Daily   [MAR Hold] colchicine   0.3 mg Oral Daily   [MAR Hold] heparin  injection (subcutaneous)  5,000 Units Subcutaneous Q8H   [MAR Hold] pantoprazole   20 mg Oral Daily   [MAR Hold] rosuvastatin   5 mg Oral q1800   [MAR Hold] sertraline   25 mg Oral Daily   [MAR Hold] sodium chloride  flush  3 mL Intravenous Q12H   Continuous Infusions:  sodium chloride  10 mL/hr at 05/28/24 0936   [MAR Hold] ampicillin -sulbactam (UNASYN ) IV 3 g (05/28/24 0542)   dextrose  100 mL/hr at 05/28/24 0226   lactated ringers  75 mL/hr at 05/28/24 0804   PRN Meds:.[MAR Hold] acetaminophen  **OR** [MAR Hold] acetaminophen , [MAR Hold] albuterol , [MAR Hold] dextrose , insulin  aspart  Allergies: Allergies as of 05/26/2024 - Review Complete 05/26/2024  Allergen Reaction Noted   Blueberry flavoring agent (non-screening) Hives and Shortness Of Breath 11/01/2017   Influenza vaccines  07/14/2022   Sulfa antibiotics Itching 03/12/2014   Sulfonamide derivatives Itching     Other Swelling and Rash 02/01/2016    Past Surgical History: Past Surgical History:  Procedure Laterality Date   BREAST BIOPSY Left 2010   Negative   CORONARY PRESSURE/FFR STUDY N/A 07/14/2022   Procedure: INTRAVASCULAR PRESSURE WIRE/FFR STUDY;  Surgeon: Darron Deatrice LABOR, MD;  Location: [facility] INVASIVE CV LAB;  Service: Cardiovascular;  Laterality: N/A;   LEFT HEART CATH AND CORONARY ANGIOGRAPHY N/A 07/14/2022   Procedure: LEFT HEART CATH AND CORONARY ANGIOGRAPHY;  Surgeon: Darron Deatrice LABOR, MD;  Location: [facility] INVASIVE CV LAB;  Service: Cardiovascular;  Laterality: N/A;   LOOP RECORDER INSERTION N/A 11/03/2017   Procedure: LOOP RECORDER INSERTION;  Surgeon: Inocencio Soyla Lunger, MD;  Location: [facility] INVASIVE CV LAB;  Service: Cardiovascular;  Laterality: N/A;   TEE WITHOUT CARDIOVERSION N/A 11/03/2017   Procedure: TRANSESOPHAGEAL ECHOCARDIOGRAM (TEE);  Surgeon: Maranda Leim DEL, MD;  Location: [facility] ENDOSCOPY;  Service: Cardiovascular;  Laterality: N/A;     Social History: Social History   Occupational History   Not on file  Tobacco Use   Smoking status: Former    Current packs/day: 0.00    Types: Cigarettes    Quit date: 09/01/1998    Years since quitting: 25.7   Smokeless tobacco: Never  Vaping Use   Vaping status: Never Used  Substance and Sexual Activity   Alcohol use: No   Drug use: No   Sexual activity: Not on file     Family History: Family History  Problem Relation Age of Onset   Heart attack Father 25   Heart attack Brother 57   Heart attack Brother        had CABG   Breast cancer Neg Hx    Otherwise, no relevant orthopaedic family history  ROS: Review of Systems: All systems reviewed and are negative except that mentioned in HPI  Work/Sport/Hobbies: See HPI  Physical Examination: Vitals:   05/28/24 0300 05/28/24 0748  BP: 118/87 (!) 154/70  Pulse: 69 (!) 146  Resp: 20   Temp: 98.1 F (36.7 C) 98.4 F (36.9 C)  SpO2: 95% 94%   Constitutional:  Awake, alert.  WN/WD Appearance: healthy, no acute distress, well-groomed Affect: Normal HEENT: EOMI, mucous membranes moist CV: RRR Pulm: breathing comfortably   Left Upper Extremity / Hand LIF bandage intact, clean, dry.  Tenderness along flexor tendon at level of proximal phalanx   Pertinent Labs: n/a  Imaging: I have personally reviewed the following studies: N/a  Additional Studies: n/a  Assessment/Plan: Yvonne Newton is a 83 y.o. 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   Given bony destruction and overlying soft tissue ulcerations, it is unlikely that the finger tip would be salvageable but will evaluate intra-operatively.  Plan for OR for I&D and possible partial amputation of the index finger.  Discussed risks, benefits, and alternatives with the patient and her son.  They elected to proceed with surgery.   Marsa Christen, MD Hand and Upper Extremity Surgery [facility] [phone] 05/28/2024 9:43 AM

## 2024-05-28 NOTE — Transfer of Care (Signed)
 Immediate Anesthesia Transfer of Care Note  Patient: Yvonne Newton  Procedure(s) Performed: IRRIGATION AND DEBRIDEMENT WOUND (Left) AMPUTATION, FINGER (Left)  Patient Location: PACU  Anesthesia Type:General  Level of Consciousness: awake and drowsy  Airway & Oxygen Therapy: Patient Spontanous Breathing and Patient connected to nasal cannula oxygen  Post-op Assessment: Report given to RN, Post -op Vital signs reviewed and stable, and Patient moving all extremities X 4  Post vital signs: Reviewed and stable  Last Vitals:  Vitals Value Taken Time  BP 153/49 05/28/24 12:45  Temp 36.8 C 05/28/24 12:34  Pulse 68 05/28/24 12:49  Resp 12 05/28/24 12:49  SpO2 100 % 05/28/24 12:49  Vitals shown include unfiled device data.  Last Pain:  Vitals:   05/28/24 1234  TempSrc:   PainSc: Asleep         Complications: No notable events documented.

## 2024-05-28 NOTE — Progress Notes (Signed)
 Admit: 05/26/2024 LOS: 1  30F AKI on CKD 3B presenting with left index finger infection in the setting of chronic tophaceous gout and persistent hyperglycemia  Subjective:  Creatinine improved to 4.8, K4.3, hemoglobin 9.1 To OR today for surgical evaluation of infected finger Maintaining stable blood sugars on D10 drip Not eating very much On Unasyn    09/26 0701 - 09/27 0700 In: 1948.3 [I.V.:1948.3] Out: 115 [Urine:115]  Filed Weights   05/26/24 2126 05/28/24 0903  Weight: 70.8 kg 70.8 kg    Scheduled Meds:  acetaminophen   1,000 mg Oral Once   [MAR Hold] allopurinol   300 mg Oral Daily   [MAR Hold] colchicine   0.3 mg Oral Daily   [MAR Hold] heparin  injection (subcutaneous)  5,000 Units Subcutaneous Q8H   [MAR Hold] pantoprazole   20 mg Oral Daily   [MAR Hold] rosuvastatin   5 mg Oral q1800   [MAR Hold] sertraline   25 mg Oral Daily   [MAR Hold] sodium chloride  flush  3 mL Intravenous Q12H   Continuous Infusions:  sodium chloride  10 mL/hr at 05/28/24 0918   [facility] Hold] ampicillin -sulbactam (UNASYN ) IV 3 g (05/28/24 0542)   dextrose  100 mL/hr at 05/28/24 0226   lactated ringers  75 mL/hr at 05/28/24 0804   PRN Meds:.[MAR Hold] acetaminophen  **OR** [MAR Hold] acetaminophen , [MAR Hold] albuterol , [MAR Hold] dextrose , insulin  aspart  Current Labs: reviewed   Physical Exam:  Blood pressure (!) 154/70, pulse (!) 146, temperature 98.4 F (36.9 C), temperature source Axillary, resp. rate 20, height 5' 5.98 (1.676 m), weight 70.8 kg, SpO2 94%. Somewhat confused, NAD, lying flat in bed Regular, normal S1 and S2 Soft, nontender No peripheral edema Normal work of breathing, clear throughout  A AKI on CKD 3B likely due to hypovolemia + ARB + NSADs; improved Infected left second finger, to the OR today, on Unasyn  Persistent hypoglycemia on oral hypoglycemic agents in the setting of reduced GFR, requiring D10 Tophaceous gout with recent increase of allopurinol  Mild metabolic acidosis  related to #1 DM2, on metformin and glipizide at admission, both held, most recent lactate 1.6  P Continue supportive care, holding ARB, diuretics Give 24 hours of LR given poor oral intake Management of hypoglycemia per [facility] Daily weights, Daily Renal Panel, Strict I/Os, Avoid nephrotoxins (NSAIDs, judicious IV Contrast)  Medication Issues; Preferred narcotic agents for pain control are hydromorphone, fentanyl , and methadone. Morphine  should not be used.  Baclofen should be avoided Avoid oral sodium phosphate and magnesium  citrate based laxatives / bowel preps    Bernardino Gasman MD 05/28/2024, 9:30 AM  Recent Labs  Lab 05/26/24 2132 05/27/24 0054 05/27/24 1107 05/28/24 0349  NA 134* 134* 137 135  K 5.6* 5.6* 5.2* 4.3  CL 100 106 105 104  CO2 16*  --  17* 18*  GLUCOSE 70 206* 62* 82  BUN 72* 70* 64* 57*  CREATININE 6.36* 5.90* 5.58* 4.76*  CALCIUM  8.9  --  8.5* 8.4*   Recent Labs  Lab 05/26/24 2132 05/27/24 0054 05/27/24 1107 05/28/24 0349  WBC 8.1  --  7.5 7.4  NEUTROABS 5.8  --   --   --   HGB 10.2* 8.8* 10.0* 9.1*  HCT 32.0* 26.0* 31.6* 27.8*  MCV 93.0  --  94.3 90.6  PLT 226  --  212 193

## 2024-05-28 NOTE — Progress Notes (Signed)
 Patient wiped down with CHGs. No jewelry per patient. No dentures or partials per patient

## 2024-05-29 ENCOUNTER — Encounter (HOSPITAL_COMMUNITY): Payer: Self-pay | Admitting: Orthopedic Surgery

## 2024-05-29 DIAGNOSIS — M1A0421 Idiopathic chronic gout, left hand, with tophus (tophi): Secondary | ICD-10-CM | POA: Diagnosis not present

## 2024-05-29 DIAGNOSIS — N189 Chronic kidney disease, unspecified: Secondary | ICD-10-CM | POA: Diagnosis not present

## 2024-05-29 DIAGNOSIS — L02612 Cutaneous abscess of left foot: Secondary | ICD-10-CM

## 2024-05-29 DIAGNOSIS — S68621A Partial traumatic transphalangeal amputation of left index finger, initial encounter: Secondary | ICD-10-CM | POA: Diagnosis not present

## 2024-05-29 DIAGNOSIS — M86142 Other acute osteomyelitis, left hand: Secondary | ICD-10-CM | POA: Diagnosis not present

## 2024-05-29 DIAGNOSIS — N179 Acute kidney failure, unspecified: Secondary | ICD-10-CM | POA: Diagnosis not present

## 2024-05-29 LAB — BASIC METABOLIC PANEL WITH GFR
Anion gap: 12 (ref 5–15)
BUN: 53 mg/dL — ABNORMAL HIGH (ref 8–23)
CO2: 17 mmol/L — ABNORMAL LOW (ref 22–32)
Calcium: 8.4 mg/dL — ABNORMAL LOW (ref 8.9–10.3)
Chloride: 101 mmol/L (ref 98–111)
Creatinine, Ser: 4.22 mg/dL — ABNORMAL HIGH (ref 0.44–1.00)
GFR, Estimated: 10 mL/min — ABNORMAL LOW (ref >60)
Glucose, Bld: 193 mg/dL — ABNORMAL HIGH (ref 70–99)
Potassium: 5 mmol/L (ref 3.5–5.1)
Sodium: 130 mmol/L — ABNORMAL LOW (ref 135–145)

## 2024-05-29 LAB — CBC WITH DIFFERENTIAL/PLATELET
Abs Immature Granulocytes: 0.02 K/uL (ref 0.00–0.07)
Basophils Absolute: 0 K/uL (ref 0.0–0.1)
Basophils Relative: 0 %
Eosinophils Absolute: 0 K/uL (ref 0.0–0.5)
Eosinophils Relative: 0 %
HCT: 27.8 % — ABNORMAL LOW (ref 36.0–46.0)
Hemoglobin: 9.3 g/dL — ABNORMAL LOW (ref 12.0–15.0)
Immature Granulocytes: 0 %
Lymphocytes Relative: 12 %
Lymphs Abs: 0.7 K/uL (ref 0.7–4.0)
MCH: 30.1 pg (ref 26.0–34.0)
MCHC: 33.5 g/dL (ref 30.0–36.0)
MCV: 90 fL (ref 80.0–100.0)
Monocytes Absolute: 0.3 K/uL (ref 0.1–1.0)
Monocytes Relative: 5 %
Neutro Abs: 4.6 K/uL (ref 1.7–7.7)
Neutrophils Relative %: 83 %
Platelets: 181 K/uL (ref 150–400)
RBC: 3.09 MIL/uL — ABNORMAL LOW (ref 3.87–5.11)
RDW: 15.4 % (ref 11.5–15.5)
WBC: 5.5 K/uL (ref 4.0–10.5)
nRBC: 0 % (ref 0.0–0.2)

## 2024-05-29 LAB — GLUCOSE, CAPILLARY
Glucose-Capillary: 101 mg/dL — ABNORMAL HIGH (ref 70–99)
Glucose-Capillary: 113 mg/dL — ABNORMAL HIGH (ref 70–99)
Glucose-Capillary: 118 mg/dL — ABNORMAL HIGH (ref 70–99)
Glucose-Capillary: 119 mg/dL — ABNORMAL HIGH (ref 70–99)
Glucose-Capillary: 126 mg/dL — ABNORMAL HIGH (ref 70–99)
Glucose-Capillary: 149 mg/dL — ABNORMAL HIGH (ref 70–99)
Glucose-Capillary: 257 mg/dL — ABNORMAL HIGH (ref 70–99)

## 2024-05-29 LAB — CK: Total CK: 149 U/L (ref 38–234)

## 2024-05-29 MED ORDER — HALOPERIDOL LACTATE 5 MG/ML IJ SOLN
0.5000 mg | Freq: Once | INTRAMUSCULAR | Status: AC
  Administered 2024-05-29: 0.5 mg via INTRAVENOUS
  Filled 2024-05-29: qty 1

## 2024-05-29 MED ORDER — LACTATED RINGERS IV SOLN: INTRAVENOUS | Status: AC

## 2024-05-29 MED ORDER — SODIUM CHLORIDE 0.9 % IV SOLN
8.0000 mg/kg | INTRAVENOUS | Status: DC
  Administered 2024-05-29 – 2024-06-04 (×4): 600 mg via INTRAVENOUS
  Filled 2024-05-29 (×4): qty 12

## 2024-05-29 NOTE — Progress Notes (Signed)
 PROGRESS NOTE  Yvonne Newton  DOB: [birth year]  PCP: [facility] FMW:[id number]  DOA: 05/26/2024  LOS: 2 days  Hospital Day: 4  Brief narrative: Yvonne Newton is a 83 y.o. female with PMH significant for dementia DM2, HTN, HLD, CHF, stroke, vitamin B12 deficiency, osteoarthritis 9/25, presented to ED from home after a fall. Per report, patient's dementia has been progressively worsening.  For the last 2 days at home, patient has not been eating or drinking, was more somnolent and also having hallucinations.  On 9/25, patient stood up and fell and is brought to ED.   In the ED, hemodynamically stable, blood glucose level was 89 Initially seen in the ED as code stroke CT head and cervical spine which did not note any acute intracranial or bony cervical abnormality.  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 Labs significant for potassium of 5.6, creatinine of 6.36, lactic acid 2.8 Chest x-ray showed cardiac enlargement with infiltration in right lower lobe lung with some bronchiectasis.  X-rays of the left hand showed communicated intra-articular fracture at the base of the second distal phalanx and associated soft tissue swelling with possible open wound.   Urinalysis noted small amount of hemoglobin, trace leukocytes, few bacteria, and 11-20 squamous epithelial cells/hpf.   Patient was given IV fluid Admitted to [facility]  Subjective: Patient was seen and examined this morning.  Hand surgery Dr. Delene was at bedside at the time. Lying on bed.  Alert, awake, able to articulate but not oriented to place, person or time. Per RN's note from last night, patient was confused, removed her meds, tried to remove her and dressing.  But was reorientable. Overnight afebrile, heart rate in 70s, blood pressure between 140s to 160s Labs from this morning with hemoglobin  9.3, sodium 130, bicarb 17, BUN/creatinine 53/4.22  Assessment and plan: AKI on CKD 3B  Acute metabolic acidosis Lactic acidosis Baseline creatinine 1.86 from August.  Presented with creatinine elevated to 6.36, lactic acid at 2.8 and bicarb low at 16 Likely prerenal secondary to poor intake. Nephrology consult appreciated Creatinine gradually improving with IV hydration.  4.22 this morning.  Urine output not fully quantified. Continue to monitor Recent Labs    07/11/23 0203 04/21/24 1800 05/26/24 2132 05/27/24 0054 05/27/24 1107 05/28/24 0349 05/29/24 0246  BUN 46* 31* 72* 70* 64* 57* 53*  CREATININE 2.27* 1.86* 6.36* 5.90* 5.58* 4.76* 4.22*  CO2 25 26 16*  --  17* 18* 17*   Recent Labs  Lab 05/26/24 2136 05/26/24 2346  LATICACIDVEN 2.8* 1.6    Communicated fracture of second distal phalanx Left index finger infection with pyogenic flexor tenosynovitis Patient presented with swelling and drainage of the left index finger.   Per report, recently started on antibiotics with concern for cellulitis.   X-rays on admission revealed communicated intra-articular fracture at the base of the second distal phalanx and associated soft tissue swelling with possible open wound.  Hand surgery was consulted. 9/27 underwent I&D of the left index finger flexor tendon sheath, release of left carpal tunnel and partial amputation of left index finger Currently on IV Unasyn . Wound culture is growing rare gram-positive cocci Discussed with hand surgeon Dr. Delene this morning.  May need to treat as osteomyelitis.  Will involve ID.  Acute metabolic encephalopathy Underlying dementia At baseline, disoriented.   Lately having hallucinations likely due to uremia,  Brain  imaging without acute stroke PTA meds- risperidone, Zoloft  Currently continued on Zoloft .  Not sure if she is compliant to risperidone. Noted episodes of confusion from last night. Continue delirium  precautions  Fall/syncope and collapse Patient was reported to have fallen/passed out after standing up from bed.   Suspect likely related to orthostatic hypotension given soft blood pressures. Check orthostatic vital signs once able PT to eval and treat  Type 2 diabetes mellitus Hyperglycemia A1c 5.4 from 05/28/2024 PTA meds-metformin glipizide.  Currently on hold Because of hypoglycemia, patient was placed on D10 drip at 100 mL/h.  With rising blood sugar level, it was stopped overnight. Blood sugar level currently in range.  Continue SSI Accu-Cheks Recent Labs  Lab 05/28/24 2221 05/29/24 0027 05/29/24 0421 05/29/24 0632 05/29/24 0908  GLUCAP 320* 257* 149* 126* 118*   Essential hypertension Blood pressures noted to be soft.   PTA meds- Coreg , Cardizem , Lasix , losartan  Currently continued on carvedilol  and Cardizem . Lasix  and losartan  remain on hold due to AKI. Continue to monitor blood pressure.   IV hydralazine as needed  HLD PTA meds- aspirin , statin Aspirin  on hold.  Continue Crestor   Mild chronic anemia GERD Hemoglobin baseline between 9 and 10.  Continue to monitor. PPI Recent Labs    05/26/24 2132 05/27/24 0054 05/27/24 1107 05/28/24 0349 05/29/24 0246  HGB 10.2* 8.8* 10.0* 9.1* 9.3*  MCV 93.0  --  94.3 90.6 90.0   Gout Continue allopurinol  and colchicine   Hyponatremia Intermittent slightly low sodium levels.  Expected to improve with improvement in appetite. Recent Labs  Lab 05/26/24 2132 05/27/24 0054 05/27/24 1107 05/28/24 0349 05/29/24 0246  NA 134* 134* 137 135 130*     Mobility:  PT Orders: Active   PT Follow up Rec:    Goals of care   Code Status: Full Code     DVT prophylaxis:  heparin  injection 5,000 Units Start: 05/27/24 2200 SCDs Start: 05/27/24 0926   Antimicrobials: IV Unasyn  Fluid: None currently Consultants: Orthopedics, nephrology, ID Family Communication: None at bedside  Status: Inpatient Level of care:   Progressive   Patient is from: Home Needs to continue in-hospital care: Needs IV antibiotics, pending final culture report. Anticipated d/c to: Pending clinical course     Diet:  Diet Order             Diet Carb Modified Fluid consistency: Thin; Room service appropriate? Yes  Diet effective now                   Scheduled Meds:  allopurinol   300 mg Oral Daily   carvedilol   12.5 mg Oral BID WC   colchicine   0.3 mg Oral Daily   diltiazem   120 mg Oral Daily   heparin  injection (subcutaneous)  5,000 Units Subcutaneous Q8H   insulin  aspart  0-5 Units Subcutaneous QHS   insulin  aspart  0-9 Units Subcutaneous TID WC   pantoprazole   20 mg Oral Daily   rosuvastatin   5 mg Oral q1800   sertraline   25 mg Oral Daily   sodium chloride  flush  3 mL Intravenous Q12H    PRN meds: acetaminophen  **OR** acetaminophen , albuterol , dextrose    Infusions:   ampicillin -sulbactam (UNASYN ) IV 3 g (05/29/24 0543)   lactated ringers       Antimicrobials: Anti-infectives (From admission, onward)    Start     Dose/Rate Route Frequency Ordered Stop   05/28/24 0600  Ampicillin -Sulbactam (UNASYN ) 3 g in sodium chloride  0.9 % 100 mL IVPB  3 g 200 mL/hr over 30 Minutes Intravenous Every 24 hours 05/27/24 0901     05/26/24 2345  Ampicillin -Sulbactam (UNASYN ) 3 g in sodium chloride  0.9 % 100 mL IVPB        3 g 200 mL/hr over 30 Minutes Intravenous  Once 05/26/24 2333 05/27/24 0407       Objective: Vitals:   05/29/24 0300 05/29/24 0906  BP: (!) 158/68 (!) 165/86  Pulse:  72  Resp: 17 20  Temp: 98.1 F (36.7 C) 97.7 F (36.5 C)  SpO2: 98% 98%    Intake/Output Summary (Last 24 hours) at 05/29/2024 1055 Last data filed at 05/29/2024 1000 Gross per 24 hour  Intake 691.67 ml  Output 205 ml  Net 486.67 ml   Filed Weights   05/26/24 2126 05/28/24 0903 05/29/24 0545  Weight: 70.8 kg 70.8 kg 73 kg   Weight change:  Body mass index is 25.99 kg/m.   Physical Exam: General exam:  Pleasant, elderly Caucasian female.  Not in physical distress Skin: No rashes, lesions or ulcers. HEENT: Atraumatic, normocephalic, no obvious bleeding Lungs: Clear to auscultation bilaterally,  CVS: S1, S2, no murmur,   GI/Abd: Soft, nontender, nondistended, bowel sound present,   CNS: Alert, awake, able to verbalize but not oriented.  Demented at baseline. Extremities: No pedal edema, no calf tenderness, left upper extremity on wrap bandage  Data Review: I have personally reviewed the laboratory data and studies available.  F/u labs ordered Unresulted Labs (From admission, onward)     Start     Ordered   05/30/24 0500  Basic metabolic panel with GFR  Tomorrow morning,   R        05/29/24 1055   05/30/24 0500  CBC with Differential/Platelet  Tomorrow morning,   R        05/29/24 1055   05/27/24 0926  Occult blood card to lab, stool  ONCE - STAT,   STAT        05/27/24 0947            Signed, Chapman Rota, MD [facility] 05/29/2024

## 2024-05-29 NOTE — Plan of Care (Signed)
   Problem: Clinical Measurements: Goal: Cardiovascular complication will be avoided Outcome: Progressing

## 2024-05-29 NOTE — Progress Notes (Signed)
 Attempted to call patient's son, Koren. Left message

## 2024-05-29 NOTE — Evaluation (Addendum)
 Occupational Therapy Evaluation Patient Details Name: Yvonne Newton MRN: [id number] DOB: 1941/04/08 Today's Date: 05/29/2024   History of Present Illness   Yvonne Newton is a 83 y.o. female presented [facility] ED 05/26/24 after fall at home. CXR suggestive right lower lobe lung infiltration with some bronchiectasis. X-rays of the left hand noted communicated intra-articular fracture at the base of the second distal phalanx and associated soft tissue swelling with possible open wound. 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 PMHx: CHF, T2DM CVA, essential HTN, HLD, OA, dementia, and gouty tophi of bilateral fingers.     Clinical Impressions Yvonne Newton was evaluated s/p the above admission list. Pt unable to report PLOF or home set up. Upon evaluation the pt was limited by baseline dementia, weakness, LUE NWB, dizziness with posterior LOB in sitting and standing and poor tolerance for activity. Overall she needed min A for bed mobility, sitting balance and to stand. Unable to assess functional mobility as pt immediately returned supine despite cues. Due to the deficits listed below the pt also needs max A for LB Adls and mod A for UB ADLs. Pt will benefit from continued acute OT services and skilled inpatient follow up therapy, <3 hours/day.  Attempted to call family.     If plan is discharge home, recommend the following:   A lot of help with walking and/or transfers;A lot of help with bathing/dressing/bathroom;Assistance with cooking/housework;Assistance with feeding;Direct supervision/assist for medications management;Direct supervision/assist for financial management;Assist for transportation;Help with stairs or ramp for entrance;Supervision due to cognitive status     Functional Status Assessment   Patient has had a recent decline in their functional status and demonstrates the ability to make significant  improvements in function in a reasonable and predictable amount of time.     Equipment Recommendations   None recommended by OT      Precautions/Restrictions   Precautions Precautions: Fall Restrictions Weight Bearing Restrictions Per Provider Order: Yes LUE Weight Bearing Per Provider Order: Non weight bearing     Mobility Bed Mobility Overal bed mobility: Needs Assistance Bed Mobility: Sit to Supine, Supine to Sit     Supine to sit: Min assist Sit to supine: Contact guard assist        Transfers Overall transfer level: Needs assistance Equipment used: 1 person hand held assist Transfers: Sit to/from Stand Sit to Stand: Min assist           General transfer comment: min A to correct posterior LOB, pt reported dizziness and immediately sat down adn returned supine      Balance Overall balance assessment: Needs assistance Sitting-balance support: Feet supported Sitting balance-Leahy Scale: Fair Sitting balance - Comments: posterior LOB Postural control: Posterior lean Standing balance support: No upper extremity supported, During functional activity Standing balance-Leahy Scale: Poor                             ADL either performed or assessed with clinical judgement   ADL Overall ADL's : Needs assistance/impaired Eating/Feeding: Set up;Sitting   Grooming: Minimal assistance;Sitting   Upper Body Bathing: Moderate assistance;Sitting   Lower Body Bathing: Maximal assistance;Sitting/lateral leans;Sit to/from stand   Upper Body Dressing : Minimal assistance;Sitting   Lower Body Dressing: Maximal assistance;Sit to/from stand   Toilet Transfer: Minimal assistance;Stand-pivot   Toileting- Clothing Manipulation and Hygiene: Total assistance;Sit to/from stand  Functional mobility during ADLs: Minimal assistance General ADL Comments: pt required significant cues. her reported dizziness upon standing.     Vision Baseline  Vision/History: 0 No visual deficits Vision Assessment?: No apparent visual deficits     Perception Perception: Not tested       Praxis Praxis: Not tested       Pertinent Vitals/Pain Pain Assessment Pain Assessment: No/denies pain     Extremity/Trunk Assessment Upper Extremity Assessment Upper Extremity Assessment: LUE deficits/detail LUE Deficits / Details: partial amputation, wrapped with ace, cues and assist needed to maintain NWB LUE Sensation: WNL LUE Coordination: decreased fine motor   Lower Extremity Assessment Lower Extremity Assessment: Defer to PT evaluation   Cervical / Trunk Assessment Cervical / Trunk Assessment: Kyphotic (mild)   Communication Communication Communication: No apparent difficulties   Cognition Arousal: Alert Behavior During Therapy: Restless Cognition: History of cognitive impairments             OT - Cognition Comments: oriented to her name only, extremely confused. followed some 1 step commands                 Following commands: Impaired Following commands impaired: Follows one step commands inconsistently     Cueing  General Comments   Cueing Techniques: Verbal cues  VSS on RA           Home Living Family/patient expects to be discharged to:: Private residence Living Arrangements: Children (3 of her children) Available Help at Discharge: Family;Available PRN/intermittently Home Equipment: Rolling Walker (2 wheels)          Prior Functioning/Environment Prior Level of Function : Needs assist          OT Problem List: Decreased strength;Decreased range of motion;Decreased activity tolerance;Impaired balance (sitting and/or standing);Decreased safety awareness;Decreased knowledge of use of DME or AE;Decreased knowledge of precautions   OT Treatment/Interventions: Self-care/ADL training;Therapeutic exercise;DME and/or AE instruction;Therapeutic activities;Patient/family education;Balance training       OT Goals(Current goals can be found in the care plan section)   Acute Rehab OT Goals Patient Stated Goal: to lay down OT Goal Formulation: With patient Time For Goal Achievement: 06/12/24 Potential to Achieve Goals: Good ADL Goals Pt Will Perform Upper Body Dressing: with set-up;sitting Pt Will Perform Lower Body Dressing: with min assist;sit to/from stand Pt Will Transfer to Toilet: with supervision Additional ADL Goal #1: pt will follow simple 1 step commands 100% of the session   OT Frequency:  Min 2X/week       AM-PAC OT 6 Clicks Daily Activity     Outcome Measure Help from another person eating meals?: A Little Help from another person taking care of personal grooming?: A Little Help from another person toileting, which includes using toliet, bedpan, or urinal?: A Lot Help from another person bathing (including washing, rinsing, drying)?: A Lot Help from another person to put on and taking off regular upper body clothing?: A Lot Help from another person to put on and taking off regular lower body clothing?: A Lot 6 Click Score: 14   End of Session Equipment Utilized During Treatment: Gait belt Nurse Communication: Mobility status  Activity Tolerance: Patient tolerated treatment well Patient left: in bed;with call bell/phone within reach;with bed alarm set  OT Visit Diagnosis: Unsteadiness on feet (R26.81);Other abnormalities of gait and mobility (R26.89);Muscle weakness (generalized) (M62.81);History of falling (Z91.81);Dizziness and giddiness (R42)                Time: 8789-8774 OT Time Calculation (min): 15  min Charges:  OT General Charges $OT Visit: 1 Visit OT Evaluation $OT Eval Low Complexity: 1 Low  Lucie Kendall, OTR/L Acute Rehabilitation Services Office [phone] Secure Chat Communication Preferred   Lucie JONETTA Kendall 05/29/2024, 2:57 PM

## 2024-05-29 NOTE — Progress Notes (Signed)
 Gave patient one time dose of haldol per order. Patient removed tele leads again.   Patient asked this RN why are you trying to kill me?   Told her I wasn't trying to kill her, I am trying to keep her safe.  Informed patient not to remove her tele leads again. I will have to put restraints on her.

## 2024-05-29 NOTE — Progress Notes (Signed)
 Patient very confused. Banging the call bell against the rail on the bed. Patient keeps saying that she is not in the hospital.  Patient reoriented. Will continue to monitor

## 2024-05-29 NOTE — Progress Notes (Addendum)
 Subjective: 1 Day Post-Op Procedure(s) (LRB): IRRIGATION AND DEBRIDEMENT WOUND (Left) AMPUTATION, FINGER (Left) Patient resting in bed.  Reportedly removed bandages and splint overnight multiple times even after securing in mitten.    Cultures positive for staph.  Pending susceptibilities.  Objective: Vital signs in last 24 hours: Temp:  [97.2 F (36.2 C)-98.3 F (36.8 C)] 97.7 F (36.5 C) (09/28 0906) Pulse Rate:  [67-72] 72 (09/28 0906) Resp:  [11-20] 20 (09/28 0906) BP: (136-175)/(49-86) 165/86 (09/28 0906) SpO2:  [94 %-100 %] 98 % (09/28 0906) Weight:  [73 kg] 73 kg (09/28 0545)  Intake/Output from previous day: 09/27 0701 - 09/28 0700 In: 1501.7 [I.V.:1301.7; IV Piggyback:200] Out: 207 [Urine:200; Blood:7] Intake/Output this shift: Total I/O In: 240 [P.O.:240] Out: -   Recent Labs    05/26/24 2132 05/27/24 0054 05/27/24 1107 05/28/24 0349 05/29/24 0246  HGB 10.2* 8.8* 10.0* 9.1* 9.3*   Recent Labs    05/28/24 0349 05/29/24 0246  WBC 7.4 5.5  RBC 3.07* 3.09*  HCT 27.8* 27.8*  PLT 193 181   Recent Labs    05/28/24 0349 05/29/24 0246  NA 135 130*  K 4.3 5.0  CL 104 101  CO2 18* 17*  BUN 57* 53*  CREATININE 4.76* 4.22*  GLUCOSE 82 193*  CALCIUM  8.4* 8.4*   Recent Labs    05/27/24 0051  INR 1.1   PE: Current dressings are intact.  There is slight shadowing over the distal stump at the closure site.  Tenderness over stump and over palmar surgical sites.  Sensation intact.  Able to wiggle fingers with pain when moving index.  Assessment/Plan: 1 Day Post-Op Procedure(s) (LRB): IRRIGATION AND DEBRIDEMENT WOUND (Left) AMPUTATION, FINGER (Left)  -No splint required but please keep surgical sites covered with xeroform, roll gauze and an ACE if patient removes again -IV abx per primary/infectious disease  -given extent of infection, consider source not fully eradicated  -NWB on LUE -elevate to help with swelling and pain  -if discharging, patient  will need follow up in my office within 1 week.   Yvonne Newton 05/29/2024, 10:56 AM [phone]

## 2024-05-29 NOTE — Progress Notes (Signed)
 Patient still very much confused. Patient removed mitts and had her left hand dressing half way off again. Explained to the patient that she needs to leave it alone so that it does not get infected.  Patient voiced understanding  Will continue to monitor

## 2024-05-29 NOTE — Consult Note (Signed)
 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 Soft tissue/flexor tendon sheath examined to level of carpal tunnel where there was no purulence  Given the open wound/ulceration where tophus was, this is likely a continguous process  Agree with tx long course 6 weeks for osteomyelitis  9/27 3 of 3 tissue culture growing staph aureus; pending susceptibility  Plan: Change abx to daptomycin F/u culture susceptibility and see if po abx an option  Defer picc for now Maintain standard isolation precaution Discussed with primary team      ------------------------------------------------ Principal Problem:   Acute kidney injury superimposed on chronic kidney disease Active Problems:   Hyperlipidemia, unspecified   Essential hypertension   Dementia without behavioral disturbance (HCC)   Infected finger   Hyperkalemia   Controlled type 2 diabetes mellitus with hypoglycemia, without long-term current use of insulin  (HCC)   Acute metabolic encephalopathy   Fall at home, initial encounter   Metabolic acidosis   Lactic acidosis   Normocytic anemia   Gout   GERD (gastroesophageal reflux disease)   Suppurative tenosynovitis of flexor tendon of left hand   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 female with gout, dm2, chf, cva, admitted for malaise/ams found to have left hand index finger osteomyelitis/cellulitis  Patient apparently has been treated as cellulitis outpatient for the finger which has  tophacecous gout. She has had doxycycline and mupirocin ointment for the ulcer from the gout She said she has no sx in the finger though   On presentation Afebrile No leukocytosis Xray of the finger showed comminuted dip joint fx Ortho evaluated due to cellulitis change and took for I&D  9/27 operative note: PROCEDURE:   I&D of left index finger flexor tendon sheath down into the palm CPT 26020 Release left carpal tunnel CPT 64721 Partial amputation of the left index finger CPT 26951 Description: palmar flexion crease over the index finger MCP joint.  The soft tissues were dissected down bluntly to the tendon sheath and the tendon sheath was opened at the level of the A1 pulley to evaluate for flexor tenosynovitis.  Purulence was present. The incision was extended proximally in a [place] fashion.   Next, clean instruments were used to open the carpal tunnel to ensure no purulence was found there.  The carpal tunnel was released and no purulence was encountered.     With the large dorsal soft tissue defect and eroded and infected bone, the decision was made to amputate the distal aspect of the index finger.  A fishmouth incision with a longer volar flap was made and taken down to bone.  The bone of the middle phalanx was rongeured to complete the amputation and the specimen was passed off to be sent to pathology.      Cx growing staph aureus  No complaint at this time    Family History  Problem Relation Age of Onset   Heart  attack Father 65   Heart attack Brother 48   Heart attack Brother        had CABG   Breast cancer Neg Hx     Social History   Tobacco Use   Smoking status: Former    Current packs/day: 0.00    Types: Cigarettes    Quit date: 09/01/1998    Years since quitting: 25.7   Smokeless tobacco: Never  Vaping Use   Vaping status: Never Used  Substance Use Topics   Alcohol use: No   Drug use: No    Allergies  Allergen Reactions   Blueberry Flavoring Agent  (Non-Screening) Hives and Shortness Of Breath   Influenza Vaccines    Penicillins Itching   Sulfa Antibiotics Itching   Sulfonamide Derivatives Itching   Other Swelling and Rash    METAL    Review of Systems: ROS All Other ROS was negative, except mentioned above   Past Medical History:  Diagnosis Date   Arthritis 07/22/2017   Cerebellar stroke (HCC)    CHF (congestive heart failure) (HCC)    Dementia (HCC)    Diabetes mellitus type 2 in nonobese (HCC)    DM (diabetes mellitus) (HCC)    Dysarthria    Essential hypertension 07/22/2017   Hyperlipidemia, unspecified 07/22/2017   OA (osteoarthritis) of knee    right   Obesity    Stroke (HCC)    Tachycardia 07/22/2017   VENTRICULAR TACHYCARDIA 09/05/2009   Qualifier: Diagnosis of  By: Lawernce CMA, Jewel     Ventricular tachycardia (HCC)    Vitamin B 12 deficiency        Scheduled Meds:  allopurinol   300 mg Oral Daily   carvedilol   12.5 mg Oral BID WC   colchicine   0.3 mg Oral Daily   diltiazem   120 mg Oral Daily   heparin  injection (subcutaneous)  5,000 Units Subcutaneous Q8H   insulin  aspart  0-5 Units Subcutaneous QHS   insulin  aspart  0-9 Units Subcutaneous TID WC   pantoprazole   20 mg Oral Daily   rosuvastatin   5 mg Oral q1800   sertraline   25 mg Oral Daily   sodium chloride  flush  3 mL Intravenous Q12H   Continuous Infusions:  ampicillin -sulbactam (UNASYN ) IV 3 g (05/29/24 0543)   lactated ringers      PRN Meds:.acetaminophen  **OR** acetaminophen , albuterol , dextrose    OBJECTIVE: Blood pressure (!) 165/86, pulse 72, temperature 97.7 F (36.5 C), temperature source Oral, resp. rate 20, height 5' 5.98 (1.676 m), weight 73 kg, SpO2 98%.  Physical Exam   General/constitutional: no distress, pleasant HEENT: Normocephalic, PER, Conj Clear, EOMI, Oropharynx clear Neck supple CV: rrr no mrg Lungs: clear to auscultation, normal respiratory effort Abd: Soft, Nontender Ext: no edema Skin: No Rash Neuro:  nonfocal MSK: left hand dressing c/d; right hand index finger also tophus but no draining wound or cellulitis  Skin/msk: picture from 9/25      Lab Results Lab Results  Component Value Date   WBC 5.5 05/29/2024   HGB 9.3 (L) 05/29/2024   HCT 27.8 (L) 05/29/2024   MCV 90.0 05/29/2024   PLT 181 05/29/2024    Lab Results  Component Value Date   CREATININE 4.22 (H) 05/29/2024   BUN 53 (H) 05/29/2024   NA 130 (L) 05/29/2024   K 5.0 05/29/2024   CL 101 05/29/2024   CO2 17 (L) 05/29/2024    Lab Results  Component Value Date   ALT 17 05/26/2024   AST 30 05/26/2024  ALKPHOS 98 05/26/2024   BILITOT 0.8 05/26/2024      Microbiology: Recent Results (from the past 240 hours)  MRSA Next Gen by PCR, Nasal     Status: None   Collection Time: 05/28/24  8:01 AM   Specimen: Nasal Mucosa; Nasal Swab  Result Value Ref Range Status   MRSA by PCR Next Gen NOT DETECTED NOT DETECTED Final    Comment: (NOTE) The GeneXpert MRSA Assay (FDA approved for NASAL specimens only), is one component of a comprehensive MRSA colonization surveillance program. It is not intended to diagnose MRSA infection nor to guide or monitor treatment for MRSA infections. Test performance is not FDA approved in patients less than 59 years old. Performed at [facility], [address] [place], [name] [address]   Aerobic/Anaerobic Culture w Gram Stain (surgical/deep wound)     Status: None (Preliminary result)   Collection Time: 05/28/24 11:01 AM   Specimen: Wound  Result Value Ref Range Status   Specimen Description WOUND  Final   Special Requests LEFT INDEX FINGER A  Final   Gram Stain   Final    FEW WBC PRESENT, PREDOMINANTLY PMN MODERATE GRAM POSITIVE COCCI    Culture   Final    ABUNDANT STAPHYLOCOCCUS AUREUS SUSCEPTIBILITIES TO FOLLOW Performed at [facility], [address] [place], [name] [address]    Report Status PENDING  Incomplete  Aerobic/Anaerobic Culture w Gram Stain  (surgical/deep wound)     Status: None (Preliminary result)   Collection Time: 05/28/24 11:26 AM   Specimen: Wound  Result Value Ref Range Status   Specimen Description WOUND  Final   Special Requests LEFT INDEX FINGER B  Final   Gram Stain RARE WBC SEEN RARE GRAM POSITIVE COCCI   Final   Culture   Final    RARE STAPHYLOCOCCUS AUREUS SUSCEPTIBILITIES TO FOLLOW Performed at [facility], [address], [place], [name] [address]    Report Status PENDING  Incomplete  Aerobic/Anaerobic Culture w Gram Stain (surgical/deep wound)     Status: None (Preliminary result)   Collection Time: 05/28/24 11:45 AM   Specimen: Finger, Left; Tissue  Result Value Ref Range Status   Specimen Description TISSUE  Final   Special Requests LEFT FINGER C  Final   Gram Stain   Final    NO WBC SEEN RARE GRAM POSITIVE COCCI Performed at [facility], [address] [place], [name] [address]    Culture FEW STAPHYLOCOCCUS AUREUS  Final   Report Status PENDING  Incomplete     Serology:    Imaging: If present, new imagings (plain films, ct scans, and mri) have been personally visualized and interpreted; radiology reports have been reviewed. Decision making incorporated into the Impression / Recommendations.  9/26 u/s renal IMPRESSION: Unremarkable renal ultrasound examination.  9/25 left hand xray 1. Comminuted intra-articular fracture at the base of the second distal phalanx, as above. 2. Associated soft tissue swelling with possible open wound.    Yvonne ONEIDA Passer, MD [facility] [phone] pager    05/29/2024, 11:40 AM

## 2024-05-29 NOTE — Progress Notes (Addendum)
 Pharmacy Antibiotic Note  Yvonne Newton is a 83 y.o. female admitted on 05/26/2024 with osteomyelitis.  Pharmacy has been consulted for Daptomycin dosing.  Pt CKD 3B Scr elevated at 4.22 (trending down). Baseline ~1.8 from August.  CK 162  Plan: Daptomycin 600 mg Q48H Monitor Scr Monitor weekly CK Monitor for muscle soreness F/u culture susceptibility  Height: 5' 5.98 (167.6 cm) Weight: 73 kg (160 lb 15 oz) IBW/kg (Calculated) : 59.26  Temp (24hrs), Avg:97.9 F (36.6 C), Min:97.2 F (36.2 C), Max:98.3 F (36.8 C)  Recent Labs  Lab 05/26/24 2132 05/26/24 2136 05/26/24 2346 05/27/24 0054 05/27/24 1107 05/28/24 0349 05/29/24 0246  WBC 8.1  --   --   --  7.5 7.4 5.5  CREATININE 6.36*  --   --  5.90* 5.58* 4.76* 4.22*  LATICACIDVEN  --  2.8* 1.6  --   --   --   --     Estimated Creatinine Clearance: 10.3 mL/min (A) (by C-G formula based on SCr of 4.22 mg/dL (H)).    Allergies  Allergen Reactions   Blueberry Flavoring Agent (Non-Screening) Hives and Shortness Of Breath   Influenza Vaccines    Penicillins Itching   Sulfa Antibiotics Itching   Sulfonamide Derivatives Itching   Other Swelling and Rash    METAL    Antimicrobials this admission: 9/26 Unasyn  >> 9/28  Dose adjustments this admission: Adjusted Daptomycin frequency  Microbiology results: 9/27 Wound culture: staph aureus  Thank you for allowing pharmacy to be a part of this patient's care.  Prentice DOROTHA Favors, PharmD PGY1 Health-System Pharmacy Administration and Leadership Resident [facility]  05/29/2024 12:31 PM

## 2024-05-29 NOTE — Progress Notes (Signed)
 Admit: 05/26/2024 LOS: 2  51F AKI on CKD 3B presenting with left index finger infection in the setting of chronic tophaceous gout and persistent hyperglycemia  Subjective:  Underwent exploration of finger and hand yesterday with findings of pyogenic flexor tenosynovitis Confused with visual hallucinations this morning UOP not fully quantified Creatinine improved to 4.2, BUN 53, K5.0    09/27 0701 - 09/28 0700 In: 1501.7 [I.V.:1301.7; IV Piggyback:200] Out: 207 [Urine:200; Blood:7]  Filed Weights   05/26/24 2126 05/28/24 0903 05/29/24 0545  Weight: 70.8 kg 70.8 kg 73 kg    Scheduled Meds:  allopurinol   300 mg Oral Daily   carvedilol   12.5 mg Oral BID WC   colchicine   0.3 mg Oral Daily   diltiazem   120 mg Oral Daily   heparin  injection (subcutaneous)  5,000 Units Subcutaneous Q8H   insulin  aspart  0-5 Units Subcutaneous QHS   insulin  aspart  0-9 Units Subcutaneous TID WC   pantoprazole   20 mg Oral Daily   rosuvastatin   5 mg Oral q1800   sertraline   25 mg Oral Daily   sodium chloride  flush  3 mL Intravenous Q12H   Continuous Infusions:  ampicillin -sulbactam (UNASYN ) IV 3 g (05/29/24 0543)   PRN Meds:.acetaminophen  **OR** acetaminophen , albuterol , dextrose   Current Labs: reviewed   Physical Exam:  Blood pressure (!) 165/86, pulse 72, temperature 97.7 F (36.5 C), temperature source Oral, resp. rate 20, height 5' 5.98 (1.676 m), weight 73 kg, SpO2 98%. Somewhat confused, NAD, lying flat in bed Regular, normal S1 and S2 Soft, nontender No peripheral edema Normal work of breathing, clear throughout  A AKI on CKD 3B likely due to hypovolemia + ARB + NSADs; improved Infected left second finger, status post surgical exploration, amputation of tip of finger, Gram stain pending on Unasyn  Persistent hypoglycemia on oral hypoglycemic agents in the setting of reduced GFR, stabilized with discontinuation of D10 drip Tophaceous gout with recent increase of allopurinol  Mild  metabolic acidosis related to #1 stable DM2, on metformin and glipizide at admission, both held, presenting lactate 1.6 Encephalopathy and visual hallucinations, delirium.  Per TRH  P Continue supportive care, holding ARB, diuretics Give 24 another hours of LR given poor oral intake Daily weights, Daily Renal Panel, Strict I/Os, Avoid nephrotoxins (NSAIDs, judicious IV Contrast)  Medication Issues; Preferred narcotic agents for pain control are hydromorphone, fentanyl , and methadone. Morphine  should not be used.  Baclofen should be avoided Avoid oral sodium phosphate and magnesium  citrate based laxatives / bowel preps    Bernardino Gasman MD 05/29/2024, 10:44 AM  Recent Labs  Lab 05/27/24 1107 05/28/24 0349 05/29/24 0246  NA 137 135 130*  K 5.2* 4.3 5.0  CL 105 104 101  CO2 17* 18* 17*  GLUCOSE 62* 82 193*  BUN 64* 57* 53*  CREATININE 5.58* 4.76* 4.22*  CALCIUM  8.5* 8.4* 8.4*   Recent Labs  Lab 05/26/24 2132 05/27/24 0054 05/27/24 1107 05/28/24 0349 05/29/24 0246  WBC 8.1  --  7.5 7.4 5.5  NEUTROABS 5.8  --   --   --  4.6  HGB 10.2*   < > 10.0* 9.1* 9.3*  HCT 32.0*   < > 31.6* 27.8* 27.8*  MCV 93.0  --  94.3 90.6 90.0  PLT 226  --  212 193 181   < > = values in this interval not displayed.

## 2024-05-29 NOTE — Evaluation (Signed)
 Physical Therapy Evaluation Patient Details Name: Yvonne Newton MRN: [id number] DOB: October 09, 1940 Today's Date: 05/29/2024  History of Present Illness  Yvonne Newton is a 83 y.o. female presented to [facility] ED 05/26/24 after fall at home. CXR suggestive right lower lobe lung infiltration with some bronchiectasis. X-rays of the left hand noted communicated intra-articular fracture at the base of the second distal phalanx and associated soft tissue swelling with possible open wound. 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 PMHx: CHF, T2DM CVA, essential HTN, HLD, OA, dementia, and gouty tophi of bilateral fingers.   Clinical Impression  Pt admitted with above diagnosis. PTA, pt ambulated without an AD. Family reports providing supervision for safety and encouraging pt to use RW without success. She is modI for ADLs on good days and requires assist on bad days. Family manages all IADLs. She lives with three of her children in a one story house with a ramped entrance. Pt currently with functional limitations due to the deficits listed below (see PT Problem List). She required minA for bed mobility and min-modA for transfers with 1 HHA. Pt is limited by impaired cognition, baseline dementia, decreased safety awareness, weight-bearing status (LUE NWB), unsteadiness, and decreased activity tolerance. Pt will benefit from acute skilled PT to increase her independence and safety with mobility to allow discharge. Recommend continued inpatient follow up therapy, <3 hours/day.    If plan is discharge home, recommend the following: A lot of help with walking and/or transfers;A lot of help with bathing/dressing/bathroom;Assistance with cooking/housework;Help with stairs or ramp for entrance;Supervision due to cognitive status;Direct supervision/assist for medications management;Assist for transportation   Can travel by private  vehicle   No    Equipment Recommendations None recommended by PT  Recommendations for Other Services       Functional Status Assessment Patient has had a recent decline in their functional status and demonstrates the ability to make significant improvements in function in a reasonable and predictable amount of time.     Precautions / Restrictions Precautions Precautions: Fall Recall of Precautions/Restrictions: Impaired Precaution/Restrictions Comments: Pt aware of weight-bearing restriction but required cues to adhere to it throughout mobility. Restrictions Weight Bearing Restrictions Per Provider Order: Yes LUE Weight Bearing Per Provider Order: Non weight bearing      Mobility  Bed Mobility Overal bed mobility: Needs Assistance Bed Mobility: Supine to Sit     Supine to sit: Min assist, HOB elevated     General bed mobility comments: Pt sat up on R side of bed with increased time. Assist to bring BLE off EOB and elevate trunk. Cues to maintain LUE NWB. Scooted pt fwd with use of bed pad.    Transfers Overall transfer level: Needs assistance Equipment used: 1 person hand held assist Transfers: Sit to/from Stand, Bed to chair/wheelchair/BSC Sit to Stand: Min assist   Step pivot transfers: Min assist, Mod assist       General transfer comment: Pt stood from lowest bed height. She held onto the back of PT's forearm with RUE. Cued pt to maintain LUE NWB at all times. Transferred to [facility] on left and recliner chair on right. Fair eccentric control.    Ambulation/Gait Ambulation/Gait assistance: Min assist, Mod assist Gait Distance (Feet): 2 Feet Assistive device: 1 person hand held assist Gait Pattern/deviations: Step-to pattern, Shuffle Gait velocity: decr     General Gait Details: Pt took short slow steps lacking foot clearence. She was  unsteady requiring assist to stabilize. Cues for sequencing. Assist to manage lines and maintain LUE NWB.  Stairs             Wheelchair Mobility     Tilt Bed    Modified Rankin (Stroke Patients Only)       Balance Overall balance assessment: Needs assistance Sitting-balance support: Feet supported Sitting balance-Leahy Scale: Fair   Postural control: Posterior lean Standing balance support: No upper extremity supported, During functional activity Standing balance-Leahy Scale: Poor Standing balance comment: Pt dependent on external support of thearpist. Unsteady with mild posterior LOB requiring modA to stabilize.                             Pertinent Vitals/Pain Pain Assessment Pain Assessment: PAINAD Breathing: normal Negative Vocalization: occasional moan/groan, low speech, negative/disapproving quality Facial Expression: smiling or inexpressive Body Language: relaxed Consolability: no need to console PAINAD Score: 1 Pain Location: L hand Pain Descriptors / Indicators: Discomfort, Aching, Sore Pain Intervention(s): Monitored during session, Limited activity within patient's tolerance, Repositioned    Home Living Family/patient expects to be discharged to:: Private residence Living Arrangements: Children (3 of her children) Available Help at Discharge: Family;Available PRN/intermittently Type of Home: House Home Access: Stairs to enter;Ramped entrance Entrance Stairs-Rails: Right;Left Entrance Stairs-Number of Steps: 2   Home Layout: One level Home Equipment: Agricultural consultant (2 wheels)      Prior Function Prior Level of Function : Needs assist  Cognitive Assist : Mobility (cognitive);ADLs (cognitive) Mobility (Cognitive): Intermittent cues ADLs (Cognitive): Intermittent cues Physical Assist : ADLs (physical)   ADLs (physical): IADLs Mobility Comments: Ambulates without AD. At least 3 falls in the past 56mo. Family provides sup for safety. ADLs Comments: Family reports on a good day pt is able to complete ADLs herself with modI to supervision and on a bad day needs  assistance. Family manages medications, household chores, and transportation.     Extremity/Trunk Assessment   Upper Extremity Assessment Upper Extremity Assessment: Defer to OT evaluation LUE Deficits / Details: partial amputation, wrapped with ace, cues and assist needed to maintain NWB LUE Sensation: WNL LUE Coordination: decreased fine motor    Lower Extremity Assessment Lower Extremity Assessment: Generalized weakness    Cervical / Trunk Assessment Cervical / Trunk Assessment: Kyphotic  Communication   Communication Communication: No apparent difficulties    Cognition Arousal: Alert Behavior During Therapy: WFL for tasks assessed/performed   PT - Cognitive impairments: History of cognitive impairments (Dementia)                         Following commands: Impaired Following commands impaired: Follows one step commands inconsistently     Cueing Cueing Techniques: Verbal cues, Gestural cues     General Comments General comments (skin integrity, edema, etc.): VSS on RA.    Exercises     Assessment/Plan    PT Assessment Patient needs continued PT services  PT Problem List Decreased balance;Decreased mobility;Decreased cognition;Decreased knowledge of use of DME;Decreased safety awareness;Decreased knowledge of precautions;Decreased activity tolerance;Decreased strength       PT Treatment Interventions DME instruction;Gait training;Functional mobility training;Therapeutic activities;Therapeutic exercise;Balance training;Cognitive remediation;Patient/family education    PT Goals (Current goals can be found in the Care Plan section)  Acute Rehab PT Goals Patient Stated Goal: Family report to maximize safety and mobility. PT Goal Formulation: With family Time For Goal Achievement: 06/12/24 Potential to Achieve Goals: Fair  Frequency Min 2X/week     Co-evaluation               AM-PAC PT 6 Clicks Mobility  Outcome Measure Help needed  turning from your back to your side while in a flat bed without using bedrails?: A Little Help needed moving from lying on your back to sitting on the side of a flat bed without using bedrails?: A Little Help needed moving to and from a bed to a chair (including a wheelchair)?: A Lot Help needed standing up from a chair using your arms (e.g., wheelchair or bedside chair)?: A Little Help needed to walk in hospital room?: A Lot Help needed climbing 3-5 steps with a railing? : A Lot 6 Click Score: 15    End of Session Equipment Utilized During Treatment: Gait belt Activity Tolerance: Patient tolerated treatment well Patient left: in chair;with call bell/phone within reach;with chair alarm set;with family/visitor present Nurse Communication: Mobility status PT Visit Diagnosis: Other abnormalities of gait and mobility (R26.89);Unsteadiness on feet (R26.81)    Time: 8574-8547 PT Time Calculation (min) (ACUTE ONLY): 27 min   Charges:   PT Evaluation $PT Eval Moderate Complexity: 1 Mod PT Treatments $Therapeutic Activity: 8-22 mins PT General Charges $$ ACUTE PT VISIT: 1 Visit         Randall SAUNDERS, PT, DPT Acute Rehabilitation Services Office: [phone] Secure Chat Preferred  Delon CHRISTELLA Callander 05/29/2024, 3:46 PM

## 2024-05-30 DIAGNOSIS — N179 Acute kidney failure, unspecified: Secondary | ICD-10-CM | POA: Diagnosis not present

## 2024-05-30 DIAGNOSIS — Z794 Long term (current) use of insulin: Secondary | ICD-10-CM

## 2024-05-30 DIAGNOSIS — E1165 Type 2 diabetes mellitus with hyperglycemia: Secondary | ICD-10-CM

## 2024-05-30 DIAGNOSIS — B9561 Methicillin susceptible Staphylococcus aureus infection as the cause of diseases classified elsewhere: Secondary | ICD-10-CM

## 2024-05-30 DIAGNOSIS — M65149 Other infective (teno)synovitis, unspecified hand: Secondary | ICD-10-CM

## 2024-05-30 DIAGNOSIS — N189 Chronic kidney disease, unspecified: Secondary | ICD-10-CM | POA: Diagnosis not present

## 2024-05-30 LAB — CBC WITH DIFFERENTIAL/PLATELET
Abs Immature Granulocytes: 0.05 K/uL (ref 0.00–0.07)
Basophils Absolute: 0.1 K/uL (ref 0.0–0.1)
Basophils Relative: 1 %
Eosinophils Absolute: 0.1 K/uL (ref 0.0–0.5)
Eosinophils Relative: 1 %
HCT: 26.5 % — ABNORMAL LOW (ref 36.0–46.0)
Hemoglobin: 8.6 g/dL — ABNORMAL LOW (ref 12.0–15.0)
Immature Granulocytes: 1 %
Lymphocytes Relative: 23 %
Lymphs Abs: 1.9 K/uL (ref 0.7–4.0)
MCH: 29.7 pg (ref 26.0–34.0)
MCHC: 32.5 g/dL (ref 30.0–36.0)
MCV: 91.4 fL (ref 80.0–100.0)
Monocytes Absolute: 0.6 K/uL (ref 0.1–1.0)
Monocytes Relative: 8 %
Neutro Abs: 5.4 K/uL (ref 1.7–7.7)
Neutrophils Relative %: 66 %
Platelets: 176 K/uL (ref 150–400)
RBC: 2.9 MIL/uL — ABNORMAL LOW (ref 3.87–5.11)
RDW: 15.4 % (ref 11.5–15.5)
WBC: 8 K/uL (ref 4.0–10.5)
nRBC: 0 % (ref 0.0–0.2)

## 2024-05-30 LAB — BASIC METABOLIC PANEL WITH GFR
Anion gap: 7 (ref 5–15)
BUN: 60 mg/dL — ABNORMAL HIGH (ref 8–23)
CO2: 20 mmol/L — ABNORMAL LOW (ref 22–32)
Calcium: 8.3 mg/dL — ABNORMAL LOW (ref 8.9–10.3)
Chloride: 106 mmol/L (ref 98–111)
Creatinine, Ser: 4.44 mg/dL — ABNORMAL HIGH (ref 0.44–1.00)
GFR, Estimated: 9 mL/min — ABNORMAL LOW (ref >60)
Glucose, Bld: 83 mg/dL (ref 70–99)
Potassium: 4.6 mmol/L (ref 3.5–5.1)
Sodium: 133 mmol/L — ABNORMAL LOW (ref 135–145)

## 2024-05-30 LAB — GLUCOSE, CAPILLARY
Glucose-Capillary: 88 mg/dL (ref 70–99)
Glucose-Capillary: 84 mg/dL (ref 70–99)
Glucose-Capillary: 82 mg/dL (ref 70–99)
Glucose-Capillary: 79 mg/dL (ref 70–99)
Glucose-Capillary: 85 mg/dL (ref 70–99)

## 2024-05-30 NOTE — [place] FL2 (Signed)
 [place]  MEDICAID FL2 LEVEL OF CARE FORM     IDENTIFICATION  Patient Name: Yvonne Newton Birthdate: 02-04-1941 Sex: female Admission Date (Current Location): 05/26/2024  [facility] and [place] Number:  Producer, television/film/video and Address:  [facility] [place]. [facility], 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) 05/28/2024   Infected finger 05/27/2024   Acute kidney injury superimposed on chronic kidney disease 05/27/2024   Hyperkalemia 05/27/2024   Controlled type 2 diabetes mellitus with hypoglycemia, without long-term current use of insulin  (HCC) 05/27/2024   Acute metabolic encephalopathy 05/27/2024   Fall at home, initial encounter 05/27/2024   Metabolic acidosis 05/27/2024   Lactic acidosis 05/27/2024   Normocytic anemia 05/27/2024   Gout 05/27/2024   GERD (gastroesophageal reflux disease) 05/27/2024   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   Coronary artery disease involving native coronary artery of native heart with unstable angina pectoris (HCC) 07/12/2022   NSTEMI (non-ST elevated myocardial infarction) (HCC) 07/11/2022   History of cerebellar stroke 2019 07/11/2022   History of ventricular tachycardia 07/11/2022   Chest pain 07/11/2022   Dementia without behavioral disturbance (HCC) 07/11/2022   Acute respiratory failure with hypoxia (HCC) 07/11/2022   Acute on chronic diastolic CHF (congestive heart failure) (HCC) 07/11/2022   Cerebellar stroke (HCC)     Dysarthria    Diabetes mellitus type 2 in nonobese (HCC)    Allergy 07/22/2017   Arthritis 07/22/2017   Diabetes mellitus type 2 with complications (HCC) 07/22/2017   Hyperlipidemia, unspecified 07/22/2017   Essential hypertension 07/22/2017   VT (ventricular tachycardia) (HCC) 07/22/2017   OBESITY 09/05/2009   VENTRICULAR TACHYCARDIA 09/05/2009    Orientation RESPIRATION BLADDER Height & Weight        Normal Continent Weight: 160 lb 15 oz (73 kg) Height:  5' 5.98 (167.6 cm)  BEHAVIORAL SYMPTOMS/MOOD NEUROLOGICAL BOWEL NUTRITION STATUS      Continent Diet (please see discharge summary)  AMBULATORY STATUS COMMUNICATION OF NEEDS Skin   Extensive Assist Verbally Normal                       Personal Care Assistance Level of Assistance  Bathing, Feeding, Dressing Bathing Assistance: Limited assistance Feeding assistance: Limited assistance Dressing Assistance: Limited assistance     Functional Limitations Info  Sight, Hearing, Speech Sight Info: Adequate Hearing Info: Adequate Speech Info: Adequate    SPECIAL CARE FACTORS FREQUENCY  PT (By licensed PT), OT (By licensed OT)     PT Frequency: 5x per week OT Frequency: 5x per week            Contractures Contractures Info: Not present    Additional Factors Info  Code Status, Allergies Code Status Info: FULL Allergies Info: [employer] rights [employer] (non-screening) High Allergy Hives, Shortness Of Breath   Influenza Vaccines Not Specified     Penicillins Not Specified  Itching   Sulfa Antibiotics Not Specified Allergy Itching   Sulfonamide Derivatives Not Specified Allergy Itching  Other Low Allergy Swelling, Rash METAL           Current Medications (05/30/2024):  This is the current hospital active medication list Current Facility-Administered Medications  Medication Dose Route Frequency Provider Last Rate Last Admin   acetaminophen  (TYLENOL ) tablet 650 mg  650 mg Oral Q6H PRN Smith, Rondell A, MD   650  mg at 05/30/24 0135   Or   acetaminophen  (TYLENOL ) suppository 650 mg  650 mg Rectal Q6H PRN Smith, Rondell A, MD       albuterol  (PROVENTIL ) (2.5 MG/3ML) 0.083% nebulizer solution 2.5 mg  2.5 mg Nebulization Q6H PRN Smith, Rondell A, MD       allopurinol  (ZYLOPRIM ) tablet 300 mg  300 mg Oral Daily Smith, Rondell A, MD   300 mg at 05/30/24 0806   carvedilol  (COREG ) tablet 12.5 mg  12.5 mg Oral BID WC Dahal, Binaya, MD   12.5 mg at 05/30/24 9192   colchicine  tablet 0.3 mg  0.3 mg Oral Daily Smith, Rondell A, MD   0.3 mg at 05/30/24 0820   DAPTOmycin (CUBICIN) 600 mg in sodium chloride  0.9 % IVPB  8 mg/kg Intravenous Q48H Dodson, Andrew J, RPH 124 mL/hr at 05/29/24 1550 600 mg at 05/29/24 1550   dextrose  50 % solution 50 mL  50 mL Intravenous PRN Claudene Reeves A, MD   50 mL at 05/27/24 1649   diltiazem  (CARDIZEM  CD) 24 hr capsule 120 mg  120 mg Oral Daily Dahal, Binaya, MD   120 mg at 05/30/24 0806   heparin  injection 5,000 Units  5,000 Units Subcutaneous Q8H Smith, Rondell A, MD   5,000 Units at 05/30/24 1503   insulin  aspart (novoLOG ) injection 0-5 Units  0-5 Units Subcutaneous QHS Debby Hitch A, MD   4 Units at 05/28/24 2307   insulin  aspart (novoLOG ) injection 0-9 Units  0-9 Units Subcutaneous TID WC Debby Hitch LABOR, MD       pantoprazole  (PROTONIX ) EC tablet 20 mg  20 mg Oral Daily Claudene, Rondell A, MD   20 mg at 05/30/24 0806   rosuvastatin  (CRESTOR ) tablet 5 mg  5 mg Oral q1800 Claudene Reeves A, MD   5 mg at 05/29/24 1549   sertraline  (ZOLOFT ) tablet 25 mg  25 mg Oral Daily Smith, Rondell A, MD   25 mg at 05/30/24 0806   sodium chloride  flush (NS) 0.9 % injection 3 mL  3 mL Intravenous Q12H Claudene Reeves A, MD   3 mL at 05/30/24 9183     Discharge Medications: Please see discharge summary for a list of discharge medications.  Relevant Imaging Results:  Relevant Lab Results:   Additional Information SSN [id number]  [name], LCSW

## 2024-05-30 NOTE — Progress Notes (Signed)
 PROGRESS NOTE  Yvonne Newton  DOB: [birth year]  PCP: [facility] FMW:[id number]  DOA: 05/26/2024  LOS: 3 days  Hospital Day: 5  Brief narrative: Yvonne Newton is a 83 y.o. female with PMH significant for dementia DM2, HTN, HLD, CHF, stroke, vitamin B12 deficiency, osteoarthritis 9/25, presented to ED from home after a fall. Per report, patient's dementia has been progressively worsening.  For the last 2 days at home, patient has not been eating or drinking, was more somnolent and also having hallucinations.  On 9/25, patient stood up and fell and is brought to ED.   In the ED, hemodynamically stable, blood glucose level was 89 Initially seen in the ED as code stroke CT head and cervical spine which did not note any acute intracranial or bony cervical abnormality.  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 Labs significant for potassium of 5.6, creatinine of 6.36, lactic acid 2.8 Chest x-ray showed cardiac enlargement with infiltration in right lower lobe lung with some bronchiectasis.  X-rays of the left hand showed communicated intra-articular fracture at the base of the second distal phalanx and associated soft tissue swelling with possible open wound.   Urinalysis noted small amount of hemoglobin, trace leukocytes, few bacteria, and 11-20 squamous epithelial cells/hpf.   Patient was given IV fluid Admitted to [facility]  Subjective: Patient was seen and examined this morning.  Lying on bed.  Not in distress.  Alert, awake, able to verbalize but not oriented.  Demented at baseline.  Son at bedside. Remains afebrile, hemodynamically stable, breathing on room air. Labs from this morning with WBC count 8, hemoglobin 8.6, sodium 133  Assessment and plan: AKI on CKD 3B  Acute metabolic acidosis Lactic acidosis Baseline creatinine 1.86 from August.  Presented  with creatinine elevated to 6.36, lactic acid at 2.8 and bicarb low at 16 Likely prerenal secondary to poor intake. With IV hydration, lactic acid, serum bicarb level have improved. Creatinine gradually improving with IV hydration.  But it seems it is plateauing now between 4 and 4.5.  Nephrology following.   Continue to monitor Recent Labs    07/11/23 0203 04/21/24 1800 05/26/24 2132 05/27/24 0054 05/27/24 1107 05/28/24 0349 05/29/24 0246 05/30/24 0359  BUN 46* 31* 72* 70* 64* 57* 53* 60*  CREATININE 2.27* 1.86* 6.36* 5.90* 5.58* 4.76* 4.22* 4.44*  CO2 25 26 16*  --  17* 18* 17* 20*   Communicated fracture of second distal phalanx Left index finger infection with pyogenic flexor tenosynovitis S/p I&D, partial amputation of tip of finger -9/27 Dr Delene Patient presented with swelling and drainage of the left index finger.   Per report, recently started on antibiotics with concern for cellulitis.   X-rays on admission revealed communicated intra-articular fracture at the base of the second distal phalanx and associated soft tissue swelling with possible open wound.  Hand surgery was consulted. 9/27 underwent I&D of the left index finger flexor tendon sheath, release of left carpal tunnel and partial amputation of left index finger Wound culture is growing rare gram-positive cocci ID consulted. Currently on IV daptomycin.  Acute metabolic encephalopathy Underlying dementia At baseline, disoriented.   Lately having hallucinations likely due to uremia. Brain imaging without acute stroke PTA meds- risperidone, Zoloft  Currently continued on Zoloft .  Not sure if she is compliant to risperidone. Has intermittent episodes of confusion in the hospital. Continue delirium precautions  Fall/syncope and collapse Patient was reported to have fallen/passed out after standing up from bed.   Suspect likely related to orthostatic hypotension given soft blood pressures. Check orthostatic  vital signs once able PT to eval and treat  Type 2 diabetes mellitus Hyperglycemia A1c 5.4 from 05/28/2024 PTA meds-metformin glipizide.  Currently on hold Because of hypoglycemia, patient was placed on D10 drip at 100 mL/h.  With rising blood sugar level, it was subsequently stopped. Blood sugar level currently in range.  Continue SSI Accu-Cheks Recent Labs  Lab 05/29/24 1248 05/29/24 1557 05/29/24 2057 05/30/24 0435 05/30/24 0608  GLUCAP 113* 101* 119* 88 84   Essential hypertension Blood pressures noted to be soft.   PTA meds- Coreg , Cardizem , Lasix , losartan  Currently continued on carvedilol  and Cardizem .  Blood pressure remains mostly controlled. Lasix  and losartan  remain on hold due to AKI. Continue to monitor blood pressure.   IV hydralazine as needed  HLD PTA meds- aspirin , statin Aspirin  on hold.  Continue Crestor   Mild chronic anemia GERD Hemoglobin baseline between 9 and 10.  Noted a gradual decline.  Continue to monitor. Continue PPI Recent Labs    05/27/24 0054 05/27/24 1107 05/28/24 0349 05/29/24 0246 05/30/24 0359  HGB 8.8* 10.0* 9.1* 9.3* 8.6*  MCV  --  94.3 90.6 90.0 91.4   Gout Continue allopurinol  and colchicine   Hyponatremia Intermittent slightly low sodium levels.  Expected to improve with improvement in appetite. Recent Labs  Lab 05/26/24 2132 05/27/24 0054 05/27/24 1107 05/28/24 0349 05/29/24 0246 05/30/24 0359  NA 134* 134* 137 135 130* 133*     Mobility:  PT Orders: Active   PT Follow up Rec: Skilled Nursing-Short Term Rehab (<3 Hours/Day)05/29/2024 1400   Goals of care   Code Status: Full Code     DVT prophylaxis:  heparin  injection 5,000 Units Start: 05/27/24 2200 SCDs Start: 05/27/24 9073   Antimicrobials: IV daptomycin Fluid: None currently Consultants: Orthopedics, nephrology, ID Family Communication: Son at bedside  Status: Inpatient Level of care:  Progressive   Patient is from: Home Needs to continue  in-hospital care: Needs IV antibiotics, pending final culture report. Anticipated d/c to: Pending clinical course     Diet:  Diet Order             Diet Carb Modified Fluid consistency: Thin; Room service appropriate? Yes  Diet effective now                   Scheduled Meds:  allopurinol   300 mg Oral Daily   carvedilol   12.5 mg Oral BID WC   colchicine   0.3 mg Oral Daily   diltiazem   120 mg Oral Daily   heparin  injection (subcutaneous)  5,000 Units Subcutaneous Q8H   insulin  aspart  0-5 Units Subcutaneous QHS   insulin  aspart  0-9 Units Subcutaneous TID WC   pantoprazole   20 mg Oral Daily   rosuvastatin   5 mg Oral q1800   sertraline   25 mg Oral Daily   sodium chloride  flush  3 mL Intravenous Q12H    PRN meds: acetaminophen  **OR** acetaminophen , albuterol , dextrose    Infusions:   DAPTOmycin 600 mg (05/29/24 1550)   lactated ringers  75 mL/hr at 05/30/24 0133    Antimicrobials: Anti-infectives (From admission, onward)    Start     Dose/Rate Route Frequency Ordered Stop   05/29/24 1330  DAPTOmycin (CUBICIN) 600 mg in sodium chloride  0.9 % IVPB        8 mg/kg  73 kg 124 mL/hr over  30 Minutes Intravenous Every 48 hours 05/29/24 1239     05/28/24 0600  Ampicillin -Sulbactam (UNASYN ) 3 g in sodium chloride  0.9 % 100 mL IVPB  Status:  Discontinued        3 g 200 mL/hr over 30 Minutes Intravenous Every 24 hours 05/27/24 0901 05/29/24 1149   05/26/24 2345  Ampicillin -Sulbactam (UNASYN ) 3 g in sodium chloride  0.9 % 100 mL IVPB        3 g 200 mL/hr over 30 Minutes Intravenous  Once 05/26/24 2333 05/27/24 0407       Objective: Vitals:   05/30/24 0425 05/30/24 0806  BP: (!) 128/54 (!) 148/86  Pulse: 63 97  Resp: 17   Temp: 97.7 F (36.5 C)   SpO2: 97%     Intake/Output Summary (Last 24 hours) at 05/30/2024 0859 Last data filed at 05/30/2024 0816 Gross per 24 hour  Intake 1487.55 ml  Output 500 ml  Net 987.55 ml   Filed Weights   05/26/24 2126 05/28/24 0903  05/29/24 0545  Weight: 70.8 kg 70.8 kg 73 kg   Weight change:  Body mass index is 25.99 kg/m.   Physical Exam: General exam: Pleasant, elderly Caucasian female.  Not in physical distress Skin: No rashes, lesions or ulcers. HEENT: Atraumatic, normocephalic, no obvious bleeding Lungs: Clear to auscultation bilaterally,  CVS: S1, S2, no murmur,   GI/Abd: Soft, nontender, nondistended, bowel sound present,   CNS: Alert, awake, able to verbalize but not oriented.  Demented at baseline. Extremities: No pedal edema, no calf tenderness, left upper extremity on wrap bandage  Data Review: I have personally reviewed the laboratory data and studies available.  F/u labs ordered Unresulted Labs (From admission, onward)     Start     Ordered   05/29/24 1300  CK  Weekly,   R      05/29/24 1239   05/27/24 0926  Occult blood card to lab, stool  ONCE - STAT,   STAT        05/27/24 0947            Signed, Chapman Rota, MD [facility] 05/30/2024

## 2024-05-30 NOTE — Plan of Care (Signed)

## 2024-05-30 NOTE — Progress Notes (Signed)
   05/30/24 1000  Spiritual Encounters  Type of Visit Initial  Care provided to: Pt and family  Conversation partners present during encounter Nurse  Referral source Nurse (RN/NT/LPN)  Reason for visit Advance directives    Chaplain responded to consult request for HCPOA. The patient stated that the document had already been notarized by an external notary public outside the hospital. Chaplain made another copy for the patient's son who is her health care agent, put the other copy into her chart, and uploaded it into Epic.     [name] Resident [phone]

## 2024-05-30 NOTE — Plan of Care (Signed)
   Problem: Activity: Goal: Risk for activity intolerance will decrease Outcome: Progressing   Problem: Nutrition: Goal: Adequate nutrition will be maintained Outcome: Progressing

## 2024-05-30 NOTE — TOC Initial Note (Addendum)
 Transition of Care [facility]) - Initial/Assessment Note    Patient Details  Name: Yvonne Newton MRN: [id number] Date of Birth: 01-23-1941  Transition of Care [facility]) CM/SW Contact:    [name], LCSW Phone Number: 05/30/2024, 4:48 PM  Clinical Narrative:             CSW met with patient at bedside, she was sleep. Patient's son, Koren was present. CSW introduced self and explained role. CSW discussed recommendation for short term rehab at [facility]. He reports patient lives in the home with her daughters. He acknowledges and believes the patient could benefits from short term rehab at [facility]. He states patient has been to  SNF before but has no preferred SNF at this time. CSW explained the SNF process. All questions answered.   TOC will provide bed offers once available.  Montie Louder, MSW, LCSW Clinical Social Worker         Expected Discharge Plan: Skilled Nursing Facility Barriers to Discharge: Continued Medical Work up   Patient Goals and CMS Choice            Expected Discharge Plan and Services In-house Referral: Clinical Social Work                                            Prior Living Arrangements/Services   Lives with:: Self, Adult Children Patient language and need for interpreter reviewed:: No        Need for Family Participation in Patient Care: Yes (Comment) Care giver support system in place?: Yes (comment)   Criminal Activity/Legal Involvement Pertinent to Current Situation/Hospitalization: No - Comment as needed  Activities of Daily Living      Permission Sought/Granted Permission sought to share information with : Family Supports    Share Information with NAME: Cherlyn Syring HCPOA  Permission granted to share info w AGENCY: SNFs  Permission granted to share info w Relationship: son  Permission granted to share info w Contact Information: [phone]  Emotional Assessment         Alcohol / Substance Use: Not  Applicable Psych Involvement: No (comment)  Admission diagnosis:  AKI (acute kidney injury) [N17.9] Infected finger [L08.9] Cerebellar stroke [facility]) [I63.9] Patient Active Problem List   Diagnosis Date Noted   Suppurative tenosynovitis of flexor tendon of left hand 05/28/2024   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) 05/28/2024   Infected finger 05/27/2024   Acute kidney injury superimposed on chronic kidney disease 05/27/2024   Hyperkalemia 05/27/2024   Controlled type 2 diabetes mellitus with hypoglycemia, without long-term current use of insulin  (HCC) 05/27/2024   Acute metabolic encephalopathy 05/27/2024   Fall at home, initial encounter 05/27/2024   Metabolic acidosis 05/27/2024   Lactic acidosis 05/27/2024   Normocytic anemia 05/27/2024   Gout 05/27/2024   GERD (gastroesophageal reflux disease) 05/27/2024   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   Coronary artery disease involving native coronary artery of native heart with unstable angina pectoris (HCC) 07/12/2022   NSTEMI (non-ST elevated myocardial infarction) (HCC) 07/11/2022   History of cerebellar stroke 2019 07/11/2022   History of ventricular tachycardia 07/11/2022   Chest pain 07/11/2022   Dementia without behavioral disturbance (HCC) 07/11/2022   Acute respiratory failure with hypoxia (HCC) 07/11/2022   Acute on chronic diastolic CHF (congestive heart failure) (HCC) 07/11/2022   Cerebellar stroke (HCC)    Dysarthria    Diabetes mellitus type  2 in nonobese [facility])    Allergy 07/22/2017   Arthritis 07/22/2017   Diabetes mellitus type 2 with complications (HCC) 07/22/2017   Hyperlipidemia, unspecified 07/22/2017   Essential hypertension 07/22/2017   VT (ventricular tachycardia) (HCC) 07/22/2017   OBESITY 09/05/2009   VENTRICULAR TACHYCARDIA 09/05/2009   PCP:  [facility]:   [phone] - [address] [place] - [address] [name] [address] Phone:  [phone] Fax: [phone]     Social Drivers of Health (SDOH) Social History: SDOH Screenings   Food Insecurity: Patient Declined (05/27/2024)  Housing: Unknown (05/27/2024)  Transportation Needs: Patient Declined (05/27/2024)  Utilities: Patient Declined (05/27/2024)  Financial Resource Strain: Low Risk  (05/03/2024)   Received from [facility]  Social Connections: Patient Declined (05/27/2024)  Tobacco Use: Medium Risk (05/28/2024)   SDOH Interventions:     Readmission Risk Interventions     No data to display

## 2024-05-30 NOTE — Plan of Care (Signed)
   Problem: Clinical Measurements: Goal: Will remain free from infection Outcome: Progressing Goal: Diagnostic test results will improve Outcome: Progressing

## 2024-05-30 NOTE — Progress Notes (Addendum)
 [facility]  The patient is an 83 year old female who is here with pyogenic flexor tenosynovitis s/p amputation. Cultures are growing MSSA. Recommend 4-6 weeks of PO Doxycycline 100mg  PO BID.   General: Well developed, well nourished female in no apparent distress HENT: Moist mucous membranes, normal nose, normal external ears, and normocephalic Neck: Supple, trachea midline, and normal cervical range of motion Eyes: PERRL, EOMI, non-icteric, and normal conjunctivae and lids Lungs: Clear to auscultation bilaterally. No wheezing, rales or rhonchi Cardiac: Regular rate and rhythm. No murmurs, rubs or gallops. No peripheral edema Abdomen: Soft, Non distended, Non tender, active bowel sounds Skin: Intact, no focal erythema or rash, and warm and dry GU: Defered genital exam Musculoskeletal: Finger wrapped in gauze and ace bandage  Neuro: Alert, no focal neurologic deficits, moves all extremities Psych: Oriented x 3, cooperative   Date of Admission:  05/26/2024      Total days of antibiotics 3   Unasyn  9/25   Daptomycin 9/28         ASSESSMENT: Yvonne Newton is a 83 y.o. 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 With good surgical control and ID of bacteria on cultures will plan a 4-6 week course of PO doxycycline 100 mg BID. I would worry risk of having picc in place with dementia would be more hazardous than what benefit it may offer.  Will arrange early follow up with ID team around 3 weeks to ensure adherence is going well with the oral antibiotic.  If need to pivot - we can look at arranging long-acting dalvance x 2 doses in the outpatient infusion clinic.   AKI - Would be very high risk for linezolid toxicity, esp with prolonged use. Sulfa allergy as well makes bactrim a poor choice in  consideration of this.   Dementia -  Fall-  Lives with children support. Nights are tough with regards to dementia. Considering placement for now to help with safety and medical needs. Takes zoloft  and risperidone (unclear if she takes this or not).  Acute delirium discussed as contributing factor and hopefully will improve as infection treated and she has normalcy around her.    PLAN: Start doxycyline 100 mg BID PO with meals at discharge with EOT planned Nov 8 (6 weeks)  ID appt 10/17 @ 9:45 am with Dr. Overton arranged   ID will sign off - please call back with any questions/concerns or if we can be of further assistance.     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 Her family including grandchildren and son are at the bedside filling in history.  Had a bad night with sundowning and agitation (combative)    Review of Systems: ROS   Allergies   Allergen Reactions   Blueberry Flavoring Agent (Non-Screening) Hives and Shortness Of Breath   Influenza Vaccines    Penicillins Itching   Sulfa Antibiotics Itching   Sulfonamide Derivatives Itching   Other Swelling and Rash    METAL    OBJECTIVE: Vitals:   05/30/24 0425 05/30/24 0806 05/30/24 0914 05/30/24 1318  BP: (!) 128/54 (!) 148/86 123/60 (!) 112/50  Pulse: 63 97 63 67  Resp: 17  16 18   Temp: 97.7 F (36.5 C)  97.8 F (36.6 C) 98 F (36.7 C)  TempSrc: Oral  Oral Oral  SpO2: 97%  97% 95%  Weight:      Height:       Body mass index is 25.99 kg/m.  Physical Exam Constitutional:      Comments: Sleeping comfortably   Skin:    General: Skin is warm and dry.     Capillary Refill: Capillary refill takes less than 2 seconds.     Comments: Finger wrapped in clean ace wrap  Neurological:     Mental Status: She is oriented to person, place, and time.     Lab Results Lab Results  Component Value Date   WBC 8.0 05/30/2024   HGB 8.6 (L) 05/30/2024   HCT 26.5 (L) 05/30/2024   MCV 91.4 05/30/2024   PLT 176 05/30/2024    Lab Results  Component Value Date   CREATININE 4.44 (H) 05/30/2024   BUN 60 (H) 05/30/2024   NA 133 (L) 05/30/2024   K 4.6 05/30/2024   CL 106 05/30/2024   CO2 20 (L) 05/30/2024    Lab Results  Component Value Date   ALT 17 05/26/2024   AST 30 05/26/2024   ALKPHOS 98 05/26/2024   BILITOT 0.8 05/26/2024     Microbiology: Recent Results (from the past 240 hours)  MRSA Next Gen by PCR, Nasal     Status: None   Collection Time: 05/28/24  8:01 AM   Specimen: Nasal Mucosa; Nasal Swab  Result Value Ref Range Status   MRSA by PCR Next Gen NOT DETECTED NOT DETECTED Final    Comment: (NOTE) The GeneXpert MRSA Assay (FDA approved for NASAL specimens only), is one component of a comprehensive MRSA colonization surveillance program. It is not intended to diagnose MRSA infection nor to guide or monitor treatment for MRSA infections. Test  performance is not FDA approved in patients less than 58 years old. Performed at [facility], [address], [place], [name] [address]   Aerobic/Anaerobic Culture w Gram Stain (surgical/deep wound)     Status: None (Preliminary result)   Collection Time: 05/28/24 11:01 AM   Specimen: Wound  Result Value Ref Range Status   Specimen Description WOUND  Final   Special Requests LEFT INDEX FINGER A  Final   Gram Stain   Final    FEW WBC PRESENT, PREDOMINANTLY PMN MODERATE GRAM POSITIVE COCCI Performed at [facility]  [facility], [address] [place], [name] [address]    Culture ABUNDANT STAPHYLOCOCCUS AUREUS  Final   Report Status PENDING  Incomplete   Organism ID, Bacteria STAPHYLOCOCCUS AUREUS  Final      Susceptibility   Staphylococcus aureus - MIC*    CIPROFLOXACIN <=0.5 SENSITIVE Sensitive     ERYTHROMYCIN >=8 RESISTANT Resistant     GENTAMICIN <=0.5 SENSITIVE Sensitive     OXACILLIN 0.5 SENSITIVE Sensitive     TETRACYCLINE <=1 SENSITIVE Sensitive     VANCOMYCIN 1 SENSITIVE Sensitive     TRIMETH/SULFA <=10 SENSITIVE Sensitive     CLINDAMYCIN RESISTANT Resistant     RIFAMPIN <=0.5 SENSITIVE Sensitive     Inducible Clindamycin POSITIVE Resistant     LINEZOLID 2 SENSITIVE Sensitive     * ABUNDANT STAPHYLOCOCCUS AUREUS  Aerobic/Anaerobic Culture w Gram Stain (surgical/deep wound)     Status: None (Preliminary result)   Collection Time: 05/28/24 11:26 AM   Specimen: Wound  Result Value Ref Range Status   Specimen Description WOUND  Final   Special Requests LEFT INDEX FINGER B  Final   Gram Stain   Final    RARE WBC SEEN RARE GRAM POSITIVE COCCI Performed at [facility], [address], [place], [name] [address]    Culture RARE STAPHYLOCOCCUS AUREUS  Final   Report Status PENDING  Incomplete   Organism ID, Bacteria STAPHYLOCOCCUS AUREUS  Final      Susceptibility   Staphylococcus aureus - MIC*    CIPROFLOXACIN <=0.5 SENSITIVE Sensitive     ERYTHROMYCIN >=8  RESISTANT Resistant     GENTAMICIN <=0.5 SENSITIVE Sensitive     OXACILLIN 0.5 SENSITIVE Sensitive     TETRACYCLINE <=1 SENSITIVE Sensitive     VANCOMYCIN 1 SENSITIVE Sensitive     TRIMETH/SULFA <=10 SENSITIVE Sensitive     CLINDAMYCIN RESISTANT Resistant     RIFAMPIN <=0.5 SENSITIVE Sensitive     Inducible Clindamycin POSITIVE Resistant     LINEZOLID 2 SENSITIVE Sensitive     * RARE STAPHYLOCOCCUS AUREUS  Aerobic/Anaerobic Culture w Gram Stain (surgical/deep wound)     Status: None (Preliminary result)   Collection Time: 05/28/24 11:45 AM   Specimen: Finger, Left; Tissue  Result Value Ref Range Status   Specimen Description TISSUE  Final   Special Requests LEFT FINGER C  Final   Gram Stain NO WBC SEEN RARE GRAM POSITIVE COCCI   Final   Culture   Final    FEW STAPHYLOCOCCUS AUREUS SUSCEPTIBILITIES PERFORMED ON PREVIOUS CULTURE WITHIN THE LAST 5 DAYS. Performed at [facility], [address], [place], [name] [address]    Report Status PENDING  Incomplete    Corean Fireman, MSN, NP-C [facility] Pager: [phone]  05/30/2024    Total Encounter Time: 14m

## 2024-05-30 NOTE — Progress Notes (Signed)
 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 Right finger wrapped, able to move them. Creatinine has plateaued at 4.40, UOP 0.5 L.   09/28 0701 - 09/29 0700 In: 1484.6 [P.O.:480; I.V.:942.6; IV Piggyback:62] Out: 500 [Urine:500]  Filed Weights   05/26/24 2126 05/28/24 0903 05/29/24 0545  Weight: 70.8 kg 70.8 kg 73 kg    Scheduled Meds:  allopurinol   300 mg Oral Daily   carvedilol   12.5 mg Oral BID WC   colchicine   0.3 mg Oral Daily   diltiazem   120 mg Oral Daily   heparin  injection (subcutaneous)  5,000 Units Subcutaneous Q8H   insulin  aspart  0-5 Units Subcutaneous QHS   insulin  aspart  0-9 Units Subcutaneous TID WC   pantoprazole   20 mg Oral Daily   rosuvastatin   5 mg Oral q1800   sertraline   25 mg Oral Daily   sodium chloride  flush  3 mL Intravenous Q12H   Continuous Infusions:  DAPTOmycin 600 mg (05/29/24 1550)   lactated ringers  75 mL/hr at 05/30/24 0133   PRN Meds:.acetaminophen  **OR** acetaminophen , albuterol , dextrose   Current Labs: reviewed   Physical Exam:  Blood pressure 123/60, pulse 63, temperature 97.8 F (36.6 C), temperature source Oral, resp. rate 16, height 5' 5.98 (1.676 m), weight 73 kg, SpO2 97%. Somewhat confused, NAD, lying flat in bed Regular, normal S1 and S2 Soft, nontender No peripheral edema Normal work of breathing, clear throughout  A AKI on CKD 3B likely due to hypovolemia + ARB + NSADs; improved Infected left second finger, status post surgical exploration, amputation of tip of finger, IntraOp culture growing Staph aureus, on daptomycin. Tophaceous gout with recent increase of allopurinol  Mild metabolic acidosis related to #1 stable DM2, on metformin and glipizide at admission, both held, presenting lactate 1.6 Encephalopathy and visual  hallucinations, delirium.  Per [facility]  P Continue supportive care, holding ARB, diuretics Daily weights, Daily Renal Panel, Strict I/Os, Avoid nephrotoxins (NSAIDs, judicious IV Contrast)  Medication Issues; Preferred narcotic agents for pain control are hydromorphone, fentanyl , and methadone. Morphine  should not be used.  Baclofen should be avoided Avoid oral sodium phosphate and magnesium  citrate based laxatives / bowel preps    Bernardino Gasman MD 05/30/2024, 11:41 AM  Recent Labs  Lab 05/28/24 0349 05/29/24 0246 05/30/24 0359  NA 135 130* 133*  K 4.3 5.0 4.6  CL 104 101 106  CO2 18* 17* 20*  GLUCOSE 82 193* 83  BUN 57* 53* 60*  CREATININE 4.76* 4.22* 4.44*  CALCIUM  8.4* 8.4* 8.3*   Recent Labs  Lab 05/26/24 2132 05/27/24 0054 05/28/24 0349 05/29/24 0246 05/30/24 0359  WBC 8.1   < > 7.4 5.5 8.0  NEUTROABS 5.8  --   --  4.6 5.4  HGB 10.2*   < > 9.1* 9.3* 8.6*  HCT 32.0*   < > 27.8* 27.8* 26.5*  MCV 93.0   < > 90.6 90.0 91.4  PLT 226   < > 193 181 176   < > = values in this interval not displayed.   Maycie Luera, M.D.  Internal Medicine Resident, PGY-2  11:51 AM, 05/30/2024

## 2024-05-31 DIAGNOSIS — N179 Acute kidney failure, unspecified: Secondary | ICD-10-CM | POA: Diagnosis not present

## 2024-05-31 DIAGNOSIS — N189 Chronic kidney disease, unspecified: Secondary | ICD-10-CM | POA: Diagnosis not present

## 2024-05-31 LAB — CBC WITH DIFFERENTIAL/PLATELET
Abs Immature Granulocytes: 0.04 K/uL (ref 0.00–0.07)
Basophils Absolute: 0.1 K/uL (ref 0.0–0.1)
Basophils Relative: 1 %
Eosinophils Absolute: 0.2 K/uL (ref 0.0–0.5)
Eosinophils Relative: 3 %
HCT: 25.3 % — ABNORMAL LOW (ref 36.0–46.0)
Hemoglobin: 8.2 g/dL — ABNORMAL LOW (ref 12.0–15.0)
Immature Granulocytes: 1 %
Lymphocytes Relative: 27 %
Lymphs Abs: 1.8 K/uL (ref 0.7–4.0)
MCH: 29.5 pg (ref 26.0–34.0)
MCHC: 32.4 g/dL (ref 30.0–36.0)
MCV: 91 fL (ref 80.0–100.0)
Monocytes Absolute: 0.6 K/uL (ref 0.1–1.0)
Monocytes Relative: 9 %
Neutro Abs: 3.8 K/uL (ref 1.7–7.7)
Neutrophils Relative %: 59 %
Platelets: 147 K/uL — ABNORMAL LOW (ref 150–400)
RBC: 2.78 MIL/uL — ABNORMAL LOW (ref 3.87–5.11)
RDW: 15.2 % (ref 11.5–15.5)
WBC: 6.5 K/uL (ref 4.0–10.5)
nRBC: 0 % (ref 0.0–0.2)

## 2024-05-31 LAB — BASIC METABOLIC PANEL WITH GFR
Anion gap: 13 (ref 5–15)
BUN: 63 mg/dL — ABNORMAL HIGH (ref 8–23)
CO2: 19 mmol/L — ABNORMAL LOW (ref 22–32)
Calcium: 8.4 mg/dL — ABNORMAL LOW (ref 8.9–10.3)
Chloride: 105 mmol/L (ref 98–111)
Creatinine, Ser: 4.05 mg/dL — ABNORMAL HIGH (ref 0.44–1.00)
GFR, Estimated: 10 mL/min — ABNORMAL LOW (ref >60)
Glucose, Bld: 84 mg/dL (ref 70–99)
Potassium: 4.2 mmol/L (ref 3.5–5.1)
Sodium: 137 mmol/L (ref 135–145)

## 2024-05-31 LAB — GLUCOSE, CAPILLARY
Glucose-Capillary: 83 mg/dL (ref 70–99)
Glucose-Capillary: 87 mg/dL (ref 70–99)
Glucose-Capillary: 96 mg/dL (ref 70–99)
Glucose-Capillary: 91 mg/dL (ref 70–99)

## 2024-05-31 LAB — SURGICAL PATHOLOGY

## 2024-05-31 MED ORDER — ORAL CARE MOUTH RINSE: 15.0000 mL | OROMUCOSAL | Status: DC | PRN

## 2024-05-31 NOTE — TOC Progression Note (Signed)
 Transition of Care [facility]) - Progression Note    Patient Details  Name: Yvonne Newton MRN: [id number] Date of Birth: 12-10-1940  Transition of Care [facility]) CM/SW Contact  [name], [name] Phone Number: 05/31/2024, 2:49 PM  Clinical Narrative:     Spoke with patient's son- informed we have bed offers. He requested to leave list of facilities in the room with the patient. He will follow up with CSW with choice after he has reviewed them.   Montie Louder, MSW, LCSW Clinical Social Worker    Expected Discharge Plan: Skilled Nursing Facility                Expected Discharge Plan and Services In-house Referral: Clinical Social Work                                             Social Drivers of Health (SDOH) Interventions SDOH Screenings   Food Insecurity: Patient Declined (05/27/2024)  Housing: Unknown (05/27/2024)  Transportation Needs: Patient Declined (05/27/2024)  Utilities: Patient Declined (05/27/2024)  Financial Resource Strain: Low Risk  (05/03/2024)   Received from [facility]  Social Connections: Patient Declined (05/27/2024)  Tobacco Use: Medium Risk (05/28/2024)    Readmission Risk Interventions     No data to display

## 2024-05-31 NOTE — Progress Notes (Signed)
 Admit: 05/26/2024 LOS: 4  46F AKI on CKD 3B presenting with left index finger infection in the setting of chronic tophaceous gout and persistent hyperglycemia  Subjective:  No major events overnight  Creatinine improved, 4.05.  UOP 600 mL Initially seen sleeping.  Overall doing well.   09/29 0701 - 09/30 0700 In: 3 [I.V.:3] Out: 600 [Urine:600]  Filed Weights   05/26/24 2126 05/28/24 0903 05/29/24 0545  Weight: 70.8 kg 70.8 kg 73 kg    Scheduled Meds:  allopurinol   300 mg Oral Daily   carvedilol   12.5 mg Oral BID WC   colchicine   0.3 mg Oral Daily   diltiazem   120 mg Oral Daily   heparin  injection (subcutaneous)  5,000 Units Subcutaneous Q8H   insulin  aspart  0-5 Units Subcutaneous QHS   insulin  aspart  0-9 Units Subcutaneous TID WC   pantoprazole   20 mg Oral Daily   rosuvastatin   5 mg Oral q1800   sertraline   25 mg Oral Daily   sodium chloride  flush  3 mL Intravenous Q12H   Continuous Infusions:  DAPTOmycin 600 mg (05/29/24 1550)   PRN Meds:.acetaminophen  **OR** acetaminophen , albuterol , dextrose , mouth rinse  Current Labs: reviewed   Physical Exam:  Blood pressure (!) 131/45, pulse 63, temperature 97.9 F (36.6 C), temperature source Oral, resp. rate 18, height 5' 5.98 (1.676 m), weight 73 kg, SpO2 93%. Somewhat confused, NAD, lying flat in bed Regular, normal S1 and S2 Soft, nontender No peripheral edema Normal work of breathing, clear throughout  A AKI on CKD 3B likely due to hypovolemia + ARB + NSADs; improved Infected left second finger, status post surgical exploration, amputation of tip of finger, IntraOp culture growing Staph aureus, on daptomycin. 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  Per TRH  P Continue supportive care, holding ARB, diuretics Daily weights,  Daily Renal Panel, Strict I/Os, Avoid nephrotoxins (NSAIDs, judicious IV Contrast)  Medication Issues; Preferred narcotic agents for pain control are hydromorphone, fentanyl , and methadone. Morphine  should not be used.  Baclofen should be avoided Avoid oral sodium phosphate and magnesium  citrate based laxatives / bowel preps    Dr. Evalene HERO Jaleel Allen  05/31/2024, 8:36 AM  Recent Labs  Lab 05/29/24 0246 05/30/24 0359 05/31/24 0340  NA 130* 133* 137  K 5.0 4.6 4.2  CL 101 106 105  CO2 17* 20* 19*  GLUCOSE 193* 83 84  BUN 53* 60* 63*  CREATININE 4.22* 4.44* 4.05*  CALCIUM  8.4* 8.3* 8.4*   Recent Labs  Lab 05/29/24 0246 05/30/24 0359 05/31/24 0340  WBC 5.5 8.0 6.5  NEUTROABS 4.6 5.4 3.8  HGB 9.3* 8.6* 8.2*  HCT 27.8* 26.5* 25.3*  MCV 90.0 91.4 91.0  PLT 181 176 147*

## 2024-05-31 NOTE — Progress Notes (Signed)
 Subjective: 3 Days Post-Op  Procedure(s) (LRB): IRRIGATION AND DEBRIDEMENT WOUND (Left) AMPUTATION, FINGER (Left) Patient reports pain as None at the time being.    Objective: Vital signs in last 24 hours: Temp:  [97.9 F (36.6 C)-98.8 F (37.1 C)] 97.9 F (36.6 C) (09/30 0800) Pulse Rate:  [63-70] 68 (09/30 0942) Resp:  [18-20] 18 (09/30 0800) BP: (112-151)/(44-58) 131/45 (09/30 0942) SpO2:  [93 %-96 %] 93 % (09/30 0800)  Intake/Output from previous day: 09/29 0701 - 09/30 0700 In: 3 [I.V.:3] Out: 600 [Urine:600] Intake/Output this shift: Total I/O In: -  Out: 200 [Urine:200]  Recent Labs    05/29/24 0246 05/30/24 0359 05/31/24 0340  HGB 9.3* 8.6* 8.2*   Recent Labs    05/30/24 0359 05/31/24 0340  WBC 8.0 6.5  RBC 2.90* 2.78*  HCT 26.5* 25.3*  PLT 176 147*   Recent Labs    05/30/24 0359 05/31/24 0340  NA 133* 137  K 4.6 4.2  CL 106 105  CO2 20* 19*  BUN 60* 63*  CREATININE 4.44* 4.05*  GLUCOSE 83 84  CALCIUM  8.3* 8.4*   No results for input(s): LABPT, INR in the last 72 hours.  incisions are healing nicely without any drainage present. There is some erythema along the residual stump of the index finger, but no tenderness to palpation along the flexor tendon sheath or in the palm of the hand.  Assessment/Plan: 3 Days Post-Op  Procedure(s) (LRB): IRRIGATION AND DEBRIDEMENT WOUND (Left) AMPUTATION, FINGER (Left) Continue ABX therapy due to Post-op infection  Yvonne Newton 05/31/2024, 11:53 AM

## 2024-05-31 NOTE — Progress Notes (Signed)
 PROGRESS NOTE  Yvonne Newton  DOB: [birth year]  PCP: [facility] FMW:[id number]  DOA: 05/26/2024  LOS: 4 days  Hospital Day: 6  Brief narrative: Yvonne Newton is a 83 y.o. female with PMH significant for dementia DM2, HTN, HLD, CHF, stroke, vitamin B12 deficiency, osteoarthritis 9/25, presented to ED from home after a fall. Per report, patient's dementia has been progressively worsening.  For the last 2 days at home, patient has not been eating or drinking, was more somnolent and also having hallucinations.  On 9/25, patient stood up and fell and is brought to ED.   In the ED, hemodynamically stable, blood glucose level was 89 Initially seen in the ED as code stroke CT head and cervical spine which did not note any acute intracranial or bony cervical abnormality.  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 Labs significant for potassium of 5.6, creatinine of 6.36, lactic acid 2.8 Chest x-ray showed cardiac enlargement with infiltration in right lower lobe lung with some bronchiectasis.  X-rays of the left hand showed communicated intra-articular fracture at the base of the second distal phalanx and associated soft tissue swelling with possible open wound.   Urinalysis noted small amount of hemoglobin, trace leukocytes, few bacteria, and 11-20 squamous epithelial cells/hpf.   Patient was given IV fluid Admitted to [facility] 9/27, underwent I&D of the left index finger flexor tendon sheath, release of left carpal tunnel and partial amputation of left index finger  Subjective: Patient was seen and examined this morning. Sitting up at the edge of the bed.  Not in distress.  No new symptoms.  Family not at bedside. Afebrile, Hemodynamically stable.  Breathing room air Labs this morning stable  Assessment and plan: AKI on CKD 3B  Acute metabolic acidosis Lactic  acidosis Baseline creatinine 1.86 from August.  Presented with creatinine elevated to 6.36, lactic acid at 2.8 and bicarb low at 16 Likely prerenal secondary to poor intake. With IV hydration, lactic acid, serum bicarb level have improved. Creatinine gradually improving with IV hydration.  4.05 today. Nephrology following.   Continue to monitor Recent Labs    07/11/23 0203 04/21/24 1800 05/26/24 2132 05/27/24 0054 05/27/24 1107 05/28/24 0349 05/29/24 0246 05/30/24 0359 05/31/24 0340  BUN 46* 31* 72* 70* 64* 57* 53* 60* 63*  CREATININE 2.27* 1.86* 6.36* 5.90* 5.58* 4.76* 4.22* 4.44* 4.05*  CO2 25 26 16*  --  17* 18* 17* 20* 19*   Communicated fracture of second distal phalanx Left index finger infection with pyogenic flexor tenosynovitis S/p I&D, partial amputation of tip of finger -9/27 Dr Delene Staph aureus on wound culture  Patient presented with swelling and drainage of the left index finger.   Per report, recently started on antibiotics with concern for cellulitis.   X-rays on admission revealed communicated intra-articular fracture at the base of the second distal phalanx and associated soft tissue swelling with possible open wound.  Hand surgery was consulted. 9/27, underwent I&D of the left index finger flexor tendon sheath, release of left carpal tunnel and partial amputation of left index finger Wound culture is growing rare Staph aureus. Currently on IV daptomycin per ID.  Acute metabolic encephalopathy Underlying dementia At baseline, disoriented.   In the hospital, she is having intermittent hallucinations, agitation.  Likely hospital induced delirium Brain imaging without acute stroke PTA meds- risperidone, Zoloft  Currently continued on Zoloft .  Not sure  if she is compliant to risperidone. Continue delirium precautions  Fall/syncope and collapse Patient was reported to have fallen/passed out after standing up from bed.   Suspect likely related to  orthostatic hypotension given soft blood pressures. Check orthostatic vital signs once able PT to eval and treat  Type 2 diabetes mellitus Hyperglycemia A1c 5.4 from 05/28/2024 PTA meds-metformin glipizide.  Currently on hold Because of hypoglycemia, patient was placed on D10 drip at 100 mL/h.  With rising blood sugar level, it was subsequently stopped. Blood sugar level currently in range.  Continue SSI Accu-Cheks Recent Labs  Lab 05/30/24 1314 05/30/24 1642 05/30/24 2049 05/31/24 0604 05/31/24 1200  GLUCAP 82 79 85 83 87   Essential hypertension Blood pressures noted to be soft.   PTA meds- Coreg , Cardizem , Lasix , losartan  Currently continued on carvedilol  and Cardizem .   Lasix  and losartan  remain on hold due to AKI. Blood pressure remains controlled Continue to monitor blood pressure.   IV hydralazine as needed  HLD PTA meds- aspirin , statin Aspirin  on hold.  Continue Crestor   Mild chronic anemia GERD Hemoglobin baseline between 9 and 10.  Noted a gradual decline.  Likely dilutional from hydration.  Continue to monitor. Continue PPI Recent Labs    05/27/24 1107 05/28/24 0349 05/29/24 0246 05/30/24 0359 05/31/24 0340  HGB 10.0* 9.1* 9.3* 8.6* 8.2*  MCV 94.3 90.6 90.0 91.4 91.0   Gout Continue allopurinol  and colchicine   Hyponatremia Intermittent slightly low sodium levels.  Expected to improve with improvement in appetite. Recent Labs  Lab 05/26/24 2132 05/27/24 0054 05/27/24 1107 05/28/24 0349 05/29/24 0246 05/30/24 0359 05/31/24 0340  NA 134* 134* 137 135 130* 133* 137     Mobility:  PT Orders: Active  PT Follow up Rec: Skilled Nursing-Short Term Rehab (<3 Hours/Day)05/29/2024 1400   Goals of care   Code Status: Full Code     DVT prophylaxis:  heparin  injection 5,000 Units Start: 05/27/24 2200 SCDs Start: 05/27/24 0926   Antimicrobials: IV daptomycin Fluid: None currently Consultants: Orthopedics, nephrology, ID Family Communication:  Son not at bedside today.  Status: Inpatient Level of care:  Progressive   Patient is from: Home Needs to continue in-hospital care: Needs IV antibiotics, pending final culture report. Anticipated d/c to: Pending clinical course  Diet:  Diet Order             Diet Carb Modified Fluid consistency: Thin; Room service appropriate? Yes  Diet effective now                   Scheduled Meds:  allopurinol   300 mg Oral Daily   carvedilol   12.5 mg Oral BID WC   colchicine   0.3 mg Oral Daily   diltiazem   120 mg Oral Daily   heparin  injection (subcutaneous)  5,000 Units Subcutaneous Q8H   insulin  aspart  0-5 Units Subcutaneous QHS   insulin  aspart  0-9 Units Subcutaneous TID WC   pantoprazole   20 mg Oral Daily   rosuvastatin   5 mg Oral q1800   sertraline   25 mg Oral Daily   sodium chloride  flush  3 mL Intravenous Q12H    PRN meds: acetaminophen  **OR** acetaminophen , albuterol , dextrose , mouth rinse   Infusions:   DAPTOmycin 600 mg (05/31/24 1324)    Antimicrobials: Anti-infectives (From admission, onward)    Start     Dose/Rate Route Frequency Ordered Stop   05/29/24 1330  DAPTOmycin (CUBICIN) 600 mg in sodium chloride  0.9 % IVPB        8  mg/kg  73 kg 124 mL/hr over 30 Minutes Intravenous Every 48 hours 05/29/24 1239     05/28/24 0600  Ampicillin -Sulbactam (UNASYN ) 3 g in sodium chloride  0.9 % 100 mL IVPB  Status:  Discontinued        3 g 200 mL/hr over 30 Minutes Intravenous Every 24 hours 05/27/24 0901 05/29/24 1149   05/26/24 2345  Ampicillin -Sulbactam (UNASYN ) 3 g in sodium chloride  0.9 % 100 mL IVPB        3 g 200 mL/hr over 30 Minutes Intravenous  Once 05/26/24 2333 05/27/24 0407       Objective: Vitals:   05/31/24 0942 05/31/24 1254  BP: (!) 131/45 (!) 118/49  Pulse: 68 70  Resp:  18  Temp:  97.6 F (36.4 C)  SpO2:  94%    Intake/Output Summary (Last 24 hours) at 05/31/2024 1405 Last data filed at 05/31/2024 0800 Gross per 24 hour  Intake --   Output 800 ml  Net -800 ml   Filed Weights   05/26/24 2126 05/28/24 0903 05/29/24 0545  Weight: 70.8 kg 70.8 kg 73 kg   Weight change:  Body mass index is 25.99 kg/m.   Physical Exam: General exam: Pleasant, elderly Caucasian female.  Not in physical distress Skin: No rashes, lesions or ulcers. HEENT: Atraumatic, normocephalic, no obvious bleeding Lungs: Clear to auscultation bilaterally,  CVS: S1, S2, no murmur,   GI/Abd: Soft, nontender, nondistended, bowel sound present,   CNS: Alert, awake, able to verbalize but not oriented.  Not restless or agitated.  Demented at baseline. Extremities: No pedal edema, no calf tenderness, left upper extremity on wrap bandage  Data Review: I have personally reviewed the laboratory data and studies available.  F/u labs ordered Unresulted Labs (From admission, onward)     Start     Ordered   05/29/24 1300  CK  Weekly,   R      05/29/24 1239   05/27/24 0926  Occult blood card to lab, stool  ONCE - STAT,   STAT        05/27/24 0947            Signed, Chapman Rota, MD [facility] 05/31/2024

## 2024-05-31 NOTE — Progress Notes (Signed)
 Physical Therapy Treatment Patient Details Name: Yvonne Newton MRN: [id number] DOB: 1941/05/22 Today's Date: 05/31/2024   History of Present Illness Yvonne Newton is a 83 y.o. female presented to [facility] ED 05/26/24 after fall at home. CXR suggestive right lower lobe lung infiltration with some bronchiectasis. X-rays of the left hand noted communicated intra-articular fracture at the base of the second distal phalanx and associated soft tissue swelling with possible open wound. 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 PMHx: CHF, T2DM CVA, essential HTN, HLD, OA, dementia, and gouty tophi of bilateral fingers.    PT Comments  Pt received in supine, sleeping and needing increased time to awaken, but more alert once seated EOB and pt agreeable to therapy session with good participation as able. Pt needing up to minA with dense cues and up to [facility] for step pivot transfer from bed to chair on her R side ~16ft away. Pt with posterior instability while standing and needs RUE support throughout, fair compliance today with LUE NWB precs. BP improved in seated posture (MAP 62 supine and MAP 73 seated EOB). Patient will benefit from continued inpatient follow up therapy, <3 hours/day, she is making progress toward goals and good participation in supine/seated LE strengthening exercises this date.    If plan is discharge home, recommend the following: A lot of help with bathing/dressing/bathroom;Assistance with cooking/housework;Help with stairs or ramp for entrance;Supervision due to cognitive status;Direct supervision/assist for medications management;Assist for transportation;Two people to help with walking and/or transfers   Can travel by private vehicle     No  Equipment Recommendations  None recommended by PT (TBD)    Recommendations for Other Services       Precautions / Restrictions Precautions Precautions:  Fall Recall of Precautions/Restrictions: Impaired Precaution/Restrictions Comments: Pt aware of weight-bearing restriction but required cues to adhere to it during mobility; pt has IV on R foot dorsal surface Restrictions Weight Bearing Restrictions Per Provider Order: Yes LUE Weight Bearing Per Provider Order: Non weight bearing     Mobility  Bed Mobility Overal bed mobility: Needs Assistance Bed Mobility: Supine to Sit     Supine to sit: Min assist, HOB elevated, Used rails     General bed mobility comments: Pt sat up on R side of bed with increased time. Assist to bring BLE off EOB and elevate trunk. Cues to maintain LUE NWB. Scooted pt fwd with use of bed pad.    Transfers Overall transfer level: Needs assistance Equipment used: 1 person hand held assist Transfers: Sit to/from Stand, Bed to chair/wheelchair/BSC Sit to Stand: Min assist   Step pivot transfers: Mod assist       General transfer comment: Pt stood from lowest bed height, cues to push up with RUE, heavy assist at gait belt and pt using RUE support of bed rail then chair arm rest wtih a few pivotal steps from bed to chair. Pt c/o fatigue after step pivot to chair and defers further standing/gait trials but agreeable to sit up in recliner and work on eating lunch which has been at bedside untouched for a couple hours.    Ambulation/Gait                   Stairs             Wheelchair Mobility     Tilt Bed    Modified Rankin (Stroke Patients Only)  Balance Overall balance assessment: Needs assistance Sitting-balance support: Feet supported Sitting balance-Leahy Scale: Fair   Postural control: Posterior lean Standing balance support: No upper extremity supported, During functional activity Standing balance-Leahy Scale: Poor Standing balance comment: Pt dependent on external support of therapist. Unsteady with mild posterior LOB requiring modA to stabilize.                             Communication Communication Communication: No apparent difficulties  Cognition Arousal: Alert Behavior During Therapy: WFL for tasks assessed/performed   PT - Cognitive impairments: History of cognitive impairments (Dementia)                       PT - Cognition Comments: lethargic when PTA arrived but more alert once sitting EOB and for remainder of session. Following commands: Impaired Following commands impaired: Follows one step commands inconsistently    Cueing Cueing Techniques: Verbal cues, Gestural cues  Exercises Other Exercises Other Exercises: supine BLE AROM: heel slides, hip abduction (AA), SLR (AA), ankle pumps x10 reps ea Other Exercises: seated BLE AROM: hip flexion, LAQ x15 reps ea at EOB    General Comments General comments (skin integrity, edema, etc.): BP 115/46 (62) supine HR 62 bpm; BP 128/55 (73) HR WFL sitting EOB; SpO2 95% on RA      Pertinent Vitals/Pain Pain Assessment Pain Assessment: PAINAD Breathing: normal Negative Vocalization: occasional moan/groan, low speech, negative/disapproving quality Facial Expression: smiling or inexpressive Body Language: relaxed Consolability: no need to console PAINAD Score: 1 Pain Location: L hand and bottom prior to OOB Pain Descriptors / Indicators: Discomfort Pain Intervention(s): Limited activity within patient's tolerance, Monitored during session, Repositioned    Home Living                          Prior Function            PT Goals (current goals can now be found in the care plan section) Acute Rehab PT Goals Patient Stated Goal: Family report to maximize safety and mobility. PT Goal Formulation: With family Time For Goal Achievement: 06/12/24 Progress towards PT goals: Progressing toward goals    Frequency    Min 2X/week      PT Plan      Co-evaluation              AM-PAC PT 6 Clicks Mobility   Outcome Measure  Help needed turning from  your back to your side while in a flat bed without using bedrails?: A Little Help needed moving from lying on your back to sitting on the side of a flat bed without using bedrails?: A Little Help needed moving to and from a bed to a chair (including a wheelchair)?: A Lot Help needed standing up from a chair using your arms (e.g., wheelchair or bedside chair)?: A Lot Help needed to walk in hospital room?: Total Help needed climbing 3-5 steps with a railing? : Total 6 Click Score: 12    End of Session Equipment Utilized During Treatment: Gait belt Activity Tolerance: Patient tolerated treatment well Patient left: in chair;with call bell/phone within reach;with chair alarm set Nurse Communication: Mobility status;Need for lift equipment (recommend Stedy vs +2 for safety to return to bed from chair) PT Visit Diagnosis: Other abnormalities of gait and mobility (R26.89);Unsteadiness on feet (R26.81)     Time: 8472-8444 PT Time Calculation (min) (ACUTE ONLY): 28  min  Charges:    $Therapeutic Exercise: 8-22 mins $Therapeutic Activity: 8-22 mins PT General Charges $$ ACUTE PT VISIT: 1 Visit                     Mardi Cannady P., PTA Acute Rehabilitation Services Secure Chat Preferred 9a-5:30pm Office: [phone]    Connell HERO [facility] 05/31/2024, 5:46 PM

## 2024-05-31 NOTE — TOC Progression Note (Signed)
 Transition of Care [facility]) - Progression Note    Patient Details  Name: Yvonne Newton MRN: [id number] Date of Birth: 1940/09/23  Transition of [facility]) CM/SW Contact  [name], [name] Phone Number: 05/31/2024, 4:54 PM  Clinical Narrative:     Received call from patient's son- he wants [facility] Hertz  [facility] will follow up with SNF tomorrow   Montie Louder, MSW, LCSW Clinical Social Worker    Expected Discharge Plan: Skilled Nursing Facility Barriers to Discharge: Continued Medical Work up               Expected Discharge Plan and Services In-house Referral: Clinical Social Work                                             Social Drivers of Health (SDOH) Interventions SDOH Screenings   Food Insecurity: Patient Declined (05/27/2024)  Housing: Unknown (05/27/2024)  Transportation Needs: Patient Declined (05/27/2024)  Utilities: Patient Declined (05/27/2024)  Financial Resource Strain: Low Risk  (05/03/2024)   Received from [facility]  Social Connections: Patient Declined (05/27/2024)  Tobacco Use: Medium Risk (05/28/2024)    Readmission Risk Interventions     No data to display

## 2024-06-01 DIAGNOSIS — N189 Chronic kidney disease, unspecified: Secondary | ICD-10-CM | POA: Diagnosis not present

## 2024-06-01 DIAGNOSIS — N179 Acute kidney failure, unspecified: Secondary | ICD-10-CM | POA: Diagnosis not present

## 2024-06-01 LAB — BASIC METABOLIC PANEL WITH GFR
Anion gap: 10 (ref 5–15)
BUN: 63 mg/dL — ABNORMAL HIGH (ref 8–23)
CO2: 19 mmol/L — ABNORMAL LOW (ref 22–32)
Calcium: 8.7 mg/dL — ABNORMAL LOW (ref 8.9–10.3)
Chloride: 109 mmol/L (ref 98–111)
Creatinine, Ser: 3.27 mg/dL — ABNORMAL HIGH (ref 0.44–1.00)
GFR, Estimated: 13 mL/min — ABNORMAL LOW (ref >60)
Glucose, Bld: 114 mg/dL — ABNORMAL HIGH (ref 70–99)
Potassium: 4.3 mmol/L (ref 3.5–5.1)
Sodium: 138 mmol/L (ref 135–145)

## 2024-06-01 LAB — GLUCOSE, CAPILLARY
Glucose-Capillary: 87 mg/dL (ref 70–99)
Glucose-Capillary: 80 mg/dL (ref 70–99)
Glucose-Capillary: 121 mg/dL — ABNORMAL HIGH (ref 70–99)
Glucose-Capillary: 104 mg/dL — ABNORMAL HIGH (ref 70–99)
Glucose-Capillary: 121 mg/dL — ABNORMAL HIGH (ref 70–99)

## 2024-06-01 MED ORDER — ENSURE PLUS HIGH PROTEIN PO LIQD
237.0000 mL | Freq: Two times a day (BID) | ORAL | Status: AC
Start: 2024-06-01 — End: ?
  Administered 2024-06-04 – 2024-06-08 (×6): 237 mL via ORAL

## 2024-06-01 MED ORDER — SODIUM CHLORIDE 0.9 % IV SOLN: INTRAVENOUS | Status: AC

## 2024-06-01 NOTE — Progress Notes (Signed)
 Orders for continuous cardiac monitoring was discontinued earlier today. Took the cardiac monitor off the patient.    Kristene Sotero BRAVO, RN

## 2024-06-01 NOTE — Progress Notes (Signed)
 Admit: 05/26/2024 LOS: 5  50F AKI on CKD 3B presenting with left index finger infection in the setting of chronic tophaceous gout and persistent hyperglycemia  Subjective:  No major events overnight.  No fever. UOP 200 mL No major events reported overnight.  Vital signs stable.  Overall doing well/near baseline.   09/30 0701 - 10/01 0700 In: 50 [P.O.:50] Out: 200 [Urine:200]  Filed Weights   05/26/24 2126 05/28/24 0903 05/29/24 0545  Weight: 70.8 kg 70.8 kg 73 kg    Scheduled Meds:  allopurinol   300 mg Oral Daily   carvedilol   12.5 mg Oral BID WC   colchicine   0.3 mg Oral Daily   diltiazem   120 mg Oral Daily   feeding supplement  237 mL Oral BID BM   heparin  injection (subcutaneous)  5,000 Units Subcutaneous Q8H   insulin  aspart  0-5 Units Subcutaneous QHS   insulin  aspart  0-9 Units Subcutaneous TID WC   pantoprazole   20 mg Oral Daily   rosuvastatin   5 mg Oral q1800   sertraline   25 mg Oral Daily   sodium chloride  flush  3 mL Intravenous Q12H   Continuous Infusions:  DAPTOmycin Stopped (05/31/24 1354)   PRN Meds:.acetaminophen  **OR** acetaminophen , albuterol , dextrose , mouth rinse  Current Labs: reviewed   Physical Exam:  Blood pressure 129/68, pulse 62, temperature (!) 97.5 F (36.4 C), temperature source Oral, resp. rate 20, height 5' 5.98 (1.676 m), weight 73 kg, SpO2 95%. Somewhat confused, NAD, lying flat in bed Regular, normal S1 and S2 Soft, nontender No peripheral edema Normal work of breathing, clear throughout  A AKI on CKD 3B likely due to hypovolemia + ARB + NSAIDs; improved Infected left second finger, status post surgical exploration, amputation of tip of finger, IntraOp culture growing Staph aureus, on daptomycin. 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  Per  TRH  P Continue supportive care, holding ARB, diuretics Daily weights, Daily Renal Panel, Strict I/Os, Avoid nephrotoxins (NSAIDs, judicious IV Contrast)  Medication Issues; Preferred narcotic agents for pain control are hydromorphone, fentanyl , and methadone. Morphine  should not be used.  Baclofen should be avoided Avoid oral sodium phosphate and magnesium  citrate based laxatives / bowel preps   Nephrology will sign off at this time.  Will schedule for hospital follow up,.  Please reconsult if needed.    Dr. Evalene HERO Romaine Neville  06/01/2024, 8:49 AM  Recent Labs  Lab 05/29/24 0246 05/30/24 0359 05/31/24 0340  NA 130* 133* 137  K 5.0 4.6 4.2  CL 101 106 105  CO2 17* 20* 19*  GLUCOSE 193* 83 84  BUN 53* 60* 63*  CREATININE 4.22* 4.44* 4.05*  CALCIUM  8.4* 8.3* 8.4*   Recent Labs  Lab 05/29/24 0246 05/30/24 0359 05/31/24 0340  WBC 5.5 8.0 6.5  NEUTROABS 4.6 5.4 3.8  HGB 9.3* 8.6* 8.2*  HCT 27.8* 26.5* 25.3*  MCV 90.0 91.4 91.0  PLT 181 176 147*

## 2024-06-01 NOTE — Progress Notes (Signed)
 PROGRESS NOTE    Yvonne Newton  FMW:[id number] DOB: 1941-06-21 DOA: 05/26/2024 PCP: [facility]   Brief Narrative:83 y.o. female with PMH significant for dementia DM2, HTN, HLD, CHF, stroke, vitamin B12 deficiency, osteoarthritis 9/25, presented to ED from home after a fall. Per report, patient's dementia has been progressively worsening.  For the last 2 days at home, patient has not been eating or drinking, was more somnolent and also having hallucinations.  On 9/25, patient stood up and fell and is brought to ED.    In the ED, hemodynamically stable, blood glucose level was 89 Initially seen in the ED as code stroke CT head and cervical spine which did not note any acute intracranial or bony cervical abnormality.  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 Labs significant for potassium of 5.6, creatinine of 6.36, lactic acid 2.8 Chest x-ray showed cardiac enlargement with infiltration in right lower lobe lung with some bronchiectasis.  X-rays of the left hand showed communicated intra-articular fracture at the base of the second distal phalanx and associated soft tissue swelling with possible open wound.   Urinalysis noted small amount of hemoglobin, trace leukocytes, few bacteria, and 11-20 squamous epithelial cells/hpf.   Patient was given IV fluid Admitted to [facility] 9/27, underwent I&D of the left i  Assessment & Plan:   Principal Problem:   Acute kidney injury superimposed on chronic kidney disease Active Problems:   Hyperkalemia   Controlled type 2 diabetes mellitus with hypoglycemia, without long-term current use of insulin  (HCC)   Infected finger   Dementia without behavioral disturbance (HCC)   Acute metabolic encephalopathy   Fall at home, initial encounter   Metabolic acidosis   Lactic acidosis   Essential hypertension   Normocytic anemia    Hyperlipidemia, unspecified   Gout   GERD (gastroesophageal reflux disease)   Suppurative tenosynovitis of flexor tendon of left hand   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  Presented with creatinine elevated to 6.36, lactic acid at 2.8 and bicarb low at 16 Likely prerenal secondary to poor intake. With IV hydration, lactic acid, serum bicarb level have improved. Creatinine gradually improving with IV hydration.  4.05 today. Nephrology following.  Labs pending Continue to monitor   Communicated fracture of second distal phalanx Left index finger infection with pyogenic flexor tenosynovitis S/p I&D, partial amputation of tip of finger -9/27 Dr Delene Staph aureus on wound culture  Patient presented with swelling and drainage of the left index finger.   Per report, recently started on antibiotics with concern for cellulitis.   X-rays on admission revealed communicated intra-articular fracture at the base of the second distal phalanx and associated soft tissue swelling with possible open wound.  Hand surgery was consulted. 9/27, underwent I&D of the left index finger flexor tendon sheath, release of left carpal tunnel and partial amputation of left index finger Wound culture is growing rare Staph aureus. Currently on IV daptomycin per ID. Start doxy on dc till nov 8th Follow up with dr  vu 10/17 945 am   Acute metabolic encephalopathy Underlying dementia At baseline, disoriented.   In the hospital, she is having intermittent hallucinations, agitation.  Likely hospital induced delirium Brain imaging without acute stroke PTA meds- risperidone, Zoloft  Currently continued on Zoloft .  Not sure if  she is compliant to risperidone. Continue delirium precautions   Fall/syncope and collapse Patient was reported to have fallen/passed out after standing up from bed.   Suspect likely  related to orthostatic hypotension given soft blood pressures. Check orthostatic vital signs once able PT to eval and treat   Type 2 diabetes mellitus Hyperglycemia A1c 5.4 from 05/28/2024 PTA meds-metformin glipizide.  Currently on hold Because of hypoglycemia, patient was placed on D10 drip at 100 mL/h.  With rising blood sugar level, it was subsequently stopped. Blood sugar level currently in range.  Continue SSI Accu-Cheks  Essential hypertension Blood pressures noted to be soft.   PTA meds- Coreg , Cardizem , Lasix , losartan  Currently continued on carvedilol  and Cardizem .   Lasix  and losartan  remain on hold due to AKI. Blood pressure remains controlled Continue to monitor blood pressure.   IV hydralazine as needed   HLD PTA meds- aspirin , statin Aspirin  on hold.  Continue Crestor    Mild chronic anemia GERD Hemoglobin baseline between 9 and 10.  Noted a gradual decline.  Likely dilutional from hydration.  Continue to monitor.   Gout Continue allopurinol  and colchicine    Hyponatremia Intermittent slightly low sodium levels.  Expected to improve with improvement in appetite.    Estimated body mass index is 25.99 kg/m as calculated from the following:   Height as of this encounter: 5' 5.98 (1.676 m).   Weight as of this encounter: 73 kg.  DVT prophylaxis: heparin  Code Status:full Family Communication: none Disposition Plan:  Status is: Inpatient Remains inpatient appropriate because: acute illness   Consultants: id nephro ortho  Subjective:  Resting in bed no events overnight   Objective: Vitals:   05/31/24 2324 06/01/24 0347 06/01/24 0834 06/01/24 1200  BP:  (!) 128/51 129/68 (!) 126/43  Pulse:  63 62 62  Resp: 20 20    Temp:  98 F (36.7 C) (!) 97.5 F (36.4 C) 97.8 F (36.6 C)  TempSrc:  Oral Oral Oral  SpO2: 98%  95% 93%  Weight:      Height:        Intake/Output Summary (Last 24 hours) at 06/01/2024 1257 Last data filed at 06/01/2024 0600 Gross  per 24 hour  Intake 50 ml  Output --  Net 50 ml   Filed Weights   05/26/24 2126 05/28/24 0903 05/29/24 0545  Weight: 70.8 kg 70.8 kg 73 kg    Examination:  General exam: Appears chronically ill appearing Respiratory system: Clear to auscultation. Respiratory effort normal. Cardiovascular system: reg Gastrointestinal system: Abdomen is nondistended, soft and nontender. No organomegaly or masses felt. Normal bowel sounds heard. Central nervous system: Alert and oriented. No focal neurological deficits. Extremities: no edema    Data Reviewed: I have personally reviewed following labs and imaging studies  CBC: Recent Labs  Lab 05/26/24 2132 05/27/24 0054 05/27/24 1107 05/28/24 0349 05/29/24 0246 05/30/24 0359 05/31/24 0340  WBC 8.1  --  7.5 7.4 5.5 8.0 6.5  NEUTROABS 5.8  --   --   --  4.6 5.4 3.8  HGB 10.2*   < > 10.0* 9.1* 9.3* 8.6* 8.2*  HCT 32.0*   < > 31.6* 27.8* 27.8* 26.5* 25.3*  MCV 93.0  --  94.3 90.6 90.0 91.4 91.0  PLT 226  --  212 193 181 176 147*   < > = values in this interval not displayed.   Basic Metabolic Panel: Recent Labs  Lab 05/27/24 1107 05/28/24 0349 05/29/24 0246 05/30/24 0359 05/31/24 0340  NA 137 135  130* 133* 137  K 5.2* 4.3 5.0 4.6 4.2  CL 105 104 101 106 105  CO2 17* 18* 17* 20* 19*  GLUCOSE 62* 82 193* 83 84  BUN 64* 57* 53* 60* 63*  CREATININE 5.58* 4.76* 4.22* 4.44* 4.05*  CALCIUM  8.5* 8.4* 8.4* 8.3* 8.4*   GFR: Estimated Creatinine Clearance: 10.8 mL/min (A) (by C-G formula based on SCr of 4.05 mg/dL (H)). Liver Function Tests: Recent Labs  Lab 05/26/24 2132  AST 30  ALT 17  ALKPHOS 98  BILITOT 0.8  PROT 6.7  ALBUMIN 3.4*   No results for input(s): LIPASE, AMYLASE in the last 168 hours. No results for input(s): AMMONIA in the last 168 hours. Coagulation Profile: Recent Labs  Lab 05/27/24 0051  INR 1.1   Cardiac Enzymes: Recent Labs  Lab 05/26/24 2132 05/29/24 1340  CKTOTAL 162 149   BNP (last 3  results) No results for input(s): PROBNP in the last 8760 hours. HbA1C: No results for input(s): HGBA1C in the last 72 hours. CBG: Recent Labs  Lab 05/31/24 1648 05/31/24 2107 06/01/24 0605 06/01/24 0852 06/01/24 1202  GLUCAP 96 91 87 80 121*   Lipid Profile: No results for input(s): CHOL, HDL, LDLCALC, TRIG, CHOLHDL, LDLDIRECT in the last 72 hours. Thyroid  Function Tests: No results for input(s): TSH, T4TOTAL, FREET4, T3FREE, THYROIDAB in the last 72 hours. Anemia Panel: No results for input(s): VITAMINB12, FOLATE, FERRITIN, TIBC, IRON, RETICCTPCT in the last 72 hours. Sepsis Labs: Recent Labs  Lab 05/26/24 2136 05/26/24 2346  LATICACIDVEN 2.8* 1.6    Recent Results (from the past 240 hours)  MRSA Next Gen by PCR, Nasal     Status: None   Collection Time: 05/28/24  8:01 AM   Specimen: Nasal Mucosa; Nasal Swab  Result Value Ref Range Status   MRSA by PCR Next Gen NOT DETECTED NOT DETECTED Final    Comment: (NOTE) The GeneXpert MRSA Assay (FDA approved for NASAL specimens only), is one component of a comprehensive MRSA colonization surveillance program. It is not intended to diagnose MRSA infection nor to guide or monitor treatment for MRSA infections. Test performance is not FDA approved in patients less than 21 years old. Performed at [facility], [address] [place], [name] [address]   Aerobic/Anaerobic Culture w Gram Stain (surgical/deep wound)     Status: None (Preliminary result)   Collection Time: 05/28/24 11:01 AM   Specimen: Wound  Result Value Ref Range Status   Specimen Description WOUND  Final   Special Requests LEFT INDEX FINGER A  Final   Gram Stain   Final    FEW WBC PRESENT, PREDOMINANTLY PMN MODERATE GRAM POSITIVE COCCI Performed at [facility], [address] [place], [name] [address]    Culture   Final    ABUNDANT STAPHYLOCOCCUS AUREUS NO ANAEROBES ISOLATED; CULTURE IN PROGRESS FOR 5  DAYS    Report Status PENDING  Incomplete   Organism ID, Bacteria STAPHYLOCOCCUS AUREUS  Final      Susceptibility   Staphylococcus aureus - MIC*    CIPROFLOXACIN <=0.5 SENSITIVE Sensitive     ERYTHROMYCIN >=8 RESISTANT Resistant     GENTAMICIN <=0.5 SENSITIVE Sensitive     OXACILLIN 0.5 SENSITIVE Sensitive     TETRACYCLINE <=1 SENSITIVE Sensitive     VANCOMYCIN 1 SENSITIVE Sensitive     TRIMETH/SULFA <=10 SENSITIVE Sensitive     CLINDAMYCIN RESISTANT Resistant     RIFAMPIN <=0.5 SENSITIVE Sensitive     Inducible Clindamycin  POSITIVE Resistant     LINEZOLID 2 SENSITIVE Sensitive     * ABUNDANT STAPHYLOCOCCUS AUREUS  Aerobic/Anaerobic Culture w Gram Stain (surgical/deep wound)     Status: None (Preliminary result)   Collection Time: 05/28/24 11:26 AM   Specimen: Wound  Result Value Ref Range Status   Specimen Description WOUND  Final   Special Requests LEFT INDEX FINGER B  Final   Gram Stain   Final    RARE WBC SEEN RARE GRAM POSITIVE COCCI Performed at [facility], [address] [place], [name] [address]    Culture   Final    RARE STAPHYLOCOCCUS AUREUS NO ANAEROBES ISOLATED; CULTURE IN PROGRESS FOR 5 DAYS    Report Status PENDING  Incomplete   Organism ID, Bacteria STAPHYLOCOCCUS AUREUS  Final      Susceptibility   Staphylococcus aureus - MIC*    CIPROFLOXACIN <=0.5 SENSITIVE Sensitive     ERYTHROMYCIN >=8 RESISTANT Resistant     GENTAMICIN <=0.5 SENSITIVE Sensitive     OXACILLIN 0.5 SENSITIVE Sensitive     TETRACYCLINE <=1 SENSITIVE Sensitive     VANCOMYCIN 1 SENSITIVE Sensitive     TRIMETH/SULFA <=10 SENSITIVE Sensitive     CLINDAMYCIN RESISTANT Resistant     RIFAMPIN <=0.5 SENSITIVE Sensitive     Inducible Clindamycin POSITIVE Resistant     LINEZOLID 2 SENSITIVE Sensitive     * RARE STAPHYLOCOCCUS AUREUS  Aerobic/Anaerobic Culture w Gram Stain (surgical/deep wound)     Status: None (Preliminary result)   Collection Time: 05/28/24 11:45 AM   Specimen:  Finger, Left; Tissue  Result Value Ref Range Status   Specimen Description TISSUE  Final   Special Requests LEFT FINGER C  Final   Gram Stain   Final    NO WBC SEEN RARE GRAM POSITIVE COCCI Performed at [facility], [address] [place], [name] [address]    Culture   Final    FEW STAPHYLOCOCCUS AUREUS SUSCEPTIBILITIES PERFORMED ON PREVIOUS CULTURE WITHIN THE LAST 5 DAYS. NO ANAEROBES ISOLATED; CULTURE IN PROGRESS FOR 5 DAYS    Report Status PENDING  Incomplete     Radiology Studies: No results found.   Scheduled Meds:  allopurinol   300 mg Oral Daily   carvedilol   12.5 mg Oral BID WC   colchicine   0.3 mg Oral Daily   diltiazem   120 mg Oral Daily   feeding supplement  237 mL Oral BID BM   heparin  injection (subcutaneous)  5,000 Units Subcutaneous Q8H   insulin  aspart  0-5 Units Subcutaneous QHS   insulin  aspart  0-9 Units Subcutaneous TID WC   pantoprazole   20 mg Oral Daily   rosuvastatin   5 mg Oral q1800   sertraline   25 mg Oral Daily   sodium chloride  flush  3 mL Intravenous Q12H   Continuous Infusions:  DAPTOmycin Stopped (05/31/24 1354)     LOS: 5 days   Almarie KANDICE Hoots, MD  06/01/2024, 12:57 PM

## 2024-06-01 NOTE — TOC Progression Note (Addendum)
 Transition of Care [facility]) - Progression Note    Patient Details  Name: Phenix Grein MRN: [id number] Date of Birth: Jan 13, 1941  Transition of Care [facility]) CM/SW Contact  Whitfield Campanile, Student-Social Work Phone Number: 06/01/2024, 11:27 AM  Clinical Narrative:     11:24 AM: MSW intern called patient's son Nelli Swalley to confirm [facility] SNF availability.   11:36 AM: CSW and MSW intern called and left voicemail to start insurance auth.  11:39 AM: CSW received callback from Tammy at [facility] team [employer]. MSW intern provided information to start insurance authorization for SNF and PTAR.   TOC will continue to assist with discharge planning once insurance auth decision is made.  Whitfield Campanile, MSW Intern  Expected Discharge Plan: Skilled Nursing Facility Barriers to Discharge: Continued Medical Work up               Expected Discharge Plan and Services In-house Referral: Clinical Social Work                                             Social Drivers of Health (SDOH) Interventions SDOH Screenings   Food Insecurity: Patient Declined (05/27/2024)  Housing: Unknown (05/27/2024)  Transportation Needs: Patient Declined (05/27/2024)  Utilities: Patient Declined (05/27/2024)  Financial Resource Strain: Low Risk  (05/03/2024)   Received from [facility]  Social Connections: Patient Declined (05/27/2024)  Tobacco Use: Medium Risk (05/28/2024)    Readmission Risk Interventions     No data to display

## 2024-06-02 DIAGNOSIS — N189 Chronic kidney disease, unspecified: Secondary | ICD-10-CM | POA: Diagnosis not present

## 2024-06-02 DIAGNOSIS — N179 Acute kidney failure, unspecified: Secondary | ICD-10-CM | POA: Diagnosis not present

## 2024-06-02 LAB — BASIC METABOLIC PANEL WITH GFR
Anion gap: 12 (ref 5–15)
BUN: 57 mg/dL — ABNORMAL HIGH (ref 8–23)
CO2: 18 mmol/L — ABNORMAL LOW (ref 22–32)
Calcium: 8.8 mg/dL — ABNORMAL LOW (ref 8.9–10.3)
Chloride: 110 mmol/L (ref 98–111)
Creatinine, Ser: 2.82 mg/dL — ABNORMAL HIGH (ref 0.44–1.00)
GFR, Estimated: 16 mL/min — ABNORMAL LOW (ref >60)
Glucose, Bld: 92 mg/dL (ref 70–99)
Potassium: 3.8 mmol/L (ref 3.5–5.1)
Sodium: 140 mmol/L (ref 135–145)

## 2024-06-02 LAB — AEROBIC/ANAEROBIC CULTURE W GRAM STAIN (SURGICAL/DEEP WOUND): Gram Stain: NONE SEEN

## 2024-06-02 LAB — CBC
HCT: 24.7 % — ABNORMAL LOW (ref 36.0–46.0)
Hemoglobin: 8.1 g/dL — ABNORMAL LOW (ref 12.0–15.0)
MCH: 29.6 pg (ref 26.0–34.0)
MCHC: 32.8 g/dL (ref 30.0–36.0)
MCV: 90.1 fL (ref 80.0–100.0)
Platelets: 151 K/uL (ref 150–400)
RBC: 2.74 MIL/uL — ABNORMAL LOW (ref 3.87–5.11)
RDW: 15.3 % (ref 11.5–15.5)
WBC: 6.3 K/uL (ref 4.0–10.5)
nRBC: 0 % (ref 0.0–0.2)

## 2024-06-02 LAB — GLUCOSE, CAPILLARY
Glucose-Capillary: 83 mg/dL (ref 70–99)
Glucose-Capillary: 83 mg/dL (ref 70–99)
Glucose-Capillary: 108 mg/dL — ABNORMAL HIGH (ref 70–99)
Glucose-Capillary: 104 mg/dL — ABNORMAL HIGH (ref 70–99)

## 2024-06-02 MED ORDER — LOPERAMIDE HCL 2 MG PO CAPS
4.0000 mg | ORAL_CAPSULE | Freq: Four times a day (QID) | ORAL | Status: DC | PRN
  Administered 2024-06-02 – 2024-06-07 (×2): 4 mg via ORAL
  Filled 2024-06-02 (×2): qty 2

## 2024-06-02 NOTE — Progress Notes (Signed)
 Admit: 05/26/2024 LOS: 6  21F AKI on CKD 3B presenting with left index finger infection in the setting of chronic tophaceous gout and persistent hyperglycemia  Subjective:  No major events reported overnight.  Afebrile.  BP up.  Recorded UOP 350 mL Creatinine improved. Seen in bed.  Complains of sore finger.  Otherwise doing okay.   10/01 0701 - 10/02 0700 In: 807.9 [P.O.:200; I.V.:545.9; IV Piggyback:62] Out: 350 [Urine:350]  Filed Weights   05/26/24 2126 05/28/24 0903 05/29/24 0545  Weight: 70.8 kg 70.8 kg 73 kg    Scheduled Meds:  allopurinol   300 mg Oral Daily   carvedilol   12.5 mg Oral BID WC   colchicine   0.3 mg Oral Daily   diltiazem   120 mg Oral Daily   feeding supplement  237 mL Oral BID BM   heparin  injection (subcutaneous)  5,000 Units Subcutaneous Q8H   insulin  aspart  0-5 Units Subcutaneous QHS   insulin  aspart  0-9 Units Subcutaneous TID WC   pantoprazole   20 mg Oral Daily   rosuvastatin   5 mg Oral q1800   sertraline   25 mg Oral Daily   sodium chloride  flush  3 mL Intravenous Q12H   Continuous Infusions:  sodium chloride  50 mL/hr at 06/02/24 0404   DAPTOmycin Stopped (05/31/24 1354)   PRN Meds:.acetaminophen  **OR** acetaminophen , albuterol , dextrose , mouth rinse  Current Labs: reviewed   Physical Exam:  Blood pressure (!) 153/56, pulse 66, temperature 98.2 F (36.8 C), temperature source Oral, resp. rate 20, height 5' 5.98 (1.676 m), weight 73 kg, SpO2 95%. Somewhat confused, NAD, lying flat in bed Regular, normal S1 and S2 Soft, nontender No peripheral edema Normal work of breathing, clear throughout  A AKI on CKD 3B likely due to hypovolemia + ARB + NSAIDs; improved Infected left second finger, status post surgical exploration, amputation of tip of finger, IntraOp culture growing Staph aureus, on daptomycin. 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  Per [facility]  P Continue supportive care, holding ARB, diuretics Daily weights, Daily Renal Panel, Strict I/Os, Avoid nephrotoxins (NSAIDs, judicious IV Contrast)  Medication Issues; Preferred narcotic agents for pain control are hydromorphone, fentanyl , and methadone. Morphine  should not be used.  Baclofen should be avoided Avoid oral sodium phosphate and magnesium  citrate based laxatives / bowel preps   Continues to clinically improve with supportive care.  Nephrology will sign off at this time.  Will arrange for hospital follow-up.  Please reach out if needed again.  Dr. Evalene HERO Sera Hitsman  06/02/2024, 8:10 AM  Recent Labs  Lab 05/31/24 0340 06/01/24 1348 06/02/24 0354  NA 137 138 140  K 4.2 4.3 3.8  CL 105 109 110  CO2 19* 19* 18*  GLUCOSE 84 114* 92  BUN 63* 63* 57*  CREATININE 4.05* 3.27* 2.82*  CALCIUM  8.4* 8.7* 8.8*   Recent Labs  Lab 05/29/24 0246 05/30/24 0359 05/31/24 0340 06/02/24 0354  WBC 5.5 8.0 6.5 6.3  NEUTROABS 4.6 5.4 3.8  --   HGB 9.3* 8.6* 8.2* 8.1*  HCT 27.8* 26.5* 25.3* 24.7*  MCV 90.0 91.4 91.0 90.1  PLT 181 176 147* 151

## 2024-06-02 NOTE — TOC Progression Note (Signed)
 Transition of Care [facility]) - Progression Note    Patient Details  Name: Yvonne Newton MRN: [id number] Date of Birth: 02/25/1941  Transition of Care [facility]) CM/SW Contact  [name], [name] Phone Number: 06/02/2024, 11:16 AM  Clinical Narrative:     Insurance still pending for SNF/[facility]   Expected Discharge Plan: Skilled Nursing Facility Barriers to Discharge: Continued Medical Work up               Expected Discharge Plan and Services In-house Referral: Clinical Social Work                                             Social Drivers of Health (SDOH) Interventions SDOH Screenings   Food Insecurity: Patient Declined (05/27/2024)  Housing: Unknown (05/27/2024)  Transportation Needs: Patient Declined (05/27/2024)  Utilities: Patient Declined (05/27/2024)  Financial Resource Strain: Low Risk  (05/03/2024)   Received from [facility]  Social Connections: Patient Declined (05/27/2024)  Tobacco Use: Medium Risk (05/28/2024)    Readmission Risk Interventions     No data to display

## 2024-06-02 NOTE — TOC Progression Note (Signed)
 Transition of Care [facility]) - Progression Note    Patient Details  Name: Yvonne Newton MRN: [id number] Date of Birth: Aug 10, 1941  Transition of Care [facility]) CM/SW Contact  [name], [name] Phone Number: 06/02/2024, 1:17 PM  Clinical Narrative:      [facility] has been approved # [id number]  SNF authorization is still pending/under medical review     Expected Discharge Plan: Skilled Nursing Facility Barriers to Discharge: Continued Medical Work up               Expected Discharge Plan and Services In-house Referral: Clinical Social Work                                             Social Drivers of Health (SDOH) Interventions SDOH Screenings   Food Insecurity: Patient Declined (05/27/2024)  Housing: Unknown (05/27/2024)  Transportation Needs: Patient Declined (05/27/2024)  Utilities: Patient Declined (05/27/2024)  Financial Resource Strain: Low Risk  (05/03/2024)   Received from [facility]  Social Connections: Patient Declined (05/27/2024)  Tobacco Use: Medium Risk (05/28/2024)    Readmission Risk Interventions     No data to display

## 2024-06-02 NOTE — Progress Notes (Signed)
 Patient ID: Yvonne Newton, female   DOB: 1940-10-14, 83 y.o.   MRN: [id number]   LOS: 6 days   Subjective: Mildly somnolent. Doing well with hand.   Objective: Vital signs in last 24 hours: Temp:  [97.8 F (36.6 C)-98.2 F (36.8 C)] 97.9 F (36.6 C) (10/02 1258) Pulse Rate:  [65-68] 68 (10/02 1258) Resp:  [20] 20 (10/02 0428) BP: (152-159)/(55-69) 156/57 (10/02 1258) SpO2:  [95 %-98 %] 97 % (10/02 1258) Last BM Date : 06/02/24   Laboratory  CBC Recent Labs    05/31/24 0340 06/02/24 0354  WBC 6.5 6.3  HGB 8.2* 8.1*  HCT 25.3* 24.7*  PLT 147* 151   BMET Recent Labs    06/01/24 1348 06/02/24 0354  NA 138 140  K 4.3 3.8  CL 109 110  CO2 19* 18*  GLUCOSE 114* 92  BUN 63* 57*  CREATININE 3.27* 2.82*  CALCIUM  8.7* 8.8*     Physical Exam General appearance: alert and no distress LUE -- Incision C/D/I. Cap refill <2s.   Assessment/Plan: S/p I&D, amputation  POD#5 -- No acute changes. CPM.     Yvonne DOROTHA Ned, PA-C Orthopedic Surgery [phone] 06/02/2024

## 2024-06-02 NOTE — Progress Notes (Signed)
 PROGRESS NOTE    Yvonne Newton  FMW:[id number] DOB: [birth year] DOA: 05/26/2024 PCP: [facility]   Brief Narrative:83 y.o. female with PMH significant for dementia DM2, HTN, HLD, CHF, stroke, vitamin B12 deficiency, osteoarthritis 9/25, presented to ED from home after a fall. Per report, patient's dementia has been progressively worsening.  For the last 2 days at home, patient has not been eating or drinking, was more somnolent and also having hallucinations.  On 9/25, patient stood up and fell and is brought to ED.    In the ED, hemodynamically stable, blood glucose level was 89 Initially seen in the ED as code stroke CT head and cervical spine which did not note any acute intracranial or bony cervical abnormality.  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 Labs significant for potassium of 5.6, creatinine of 6.36, lactic acid 2.8 Chest x-ray showed cardiac enlargement with infiltration in right lower lobe lung with some bronchiectasis.  X-rays of the left hand showed communicated intra-articular fracture at the base of the second distal phalanx and associated soft tissue swelling with possible open wound.   Urinalysis noted small amount of hemoglobin, trace leukocytes, few bacteria, and 11-20 squamous epithelial cells/hpf.   Patient was given IV fluid Admitted to [facility] 9/27, underwent I&D of the left i  Assessment & Plan:   Principal Problem:   Acute kidney injury superimposed on chronic kidney disease Active Problems:   Hyperkalemia   Controlled type 2 diabetes mellitus with hypoglycemia, without long-term current use of insulin  (HCC)   Infected finger   Dementia without behavioral disturbance (HCC)   Acute metabolic encephalopathy   Fall at home, initial encounter   Metabolic acidosis   Lactic acidosis   Essential hypertension   Normocytic anemia    Hyperlipidemia, unspecified   Gout   GERD (gastroesophageal reflux disease)   Suppurative tenosynovitis of flexor tendon of left hand   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  Presented with creatinine elevated to 6.36, lactic acid at 2.8 and bicarb low at 16 Likely prerenal secondary to poor intake. With IV hydration, lactic acid, serum bicarb level have improved. Creatinine gradually improving with IV hydration.  2.82 from 4   Communicated fracture of second distal phalanx Left index finger infection with pyogenic flexor tenosynovitis S/p I&D, partial amputation of tip of finger -9/27 Dr Delene Staph aureus on wound culture  Patient presented with swelling and drainage of the left index finger.   Per report, recently started on antibiotics with concern for cellulitis.   X-rays on admission revealed communicated intra-articular fracture at the base of the second distal phalanx and associated soft tissue swelling with possible open wound.  Hand surgery was consulted. 9/27, underwent I&D of the left index finger flexor tendon sheath, release of left carpal tunnel and partial amputation of left index finger Wound culture is growing rare Staph aureus. Currently on IV daptomycin per ID. Start doxy on dc till nov 8th Follow up with dr  vu 10/17 945 am   Acute metabolic encephalopathy Underlying dementia At baseline, disoriented.   In the hospital, she is having intermittent hallucinations, agitation.  Likely hospital induced delirium Brain imaging without acute stroke PTA meds- risperidone, Zoloft  Currently continued on Zoloft .  Not sure if she is compliant to risperidone. Continue delirium  precautions   Fall/syncope and collapse Patient was reported to have fallen/passed out after standing up from bed.   Suspect likely related to orthostatic hypotension given soft blood  pressures. Check orthostatic vital signs once able PT to eval and treat   Type 2 diabetes mellitus Hyperglycemia A1c 5.4 from 05/28/2024 PTA meds-metformin glipizide.  Currently on hold Because of hypoglycemia, patient was placed on D10 drip at 100 mL/h.  With rising blood sugar level, it was subsequently stopped. Blood sugar level currently in range.  Continue SSI Accu-Cheks  Essential hypertension Blood pressures noted to be soft.   PTA meds- Coreg , Cardizem , Lasix , losartan  Currently continued on carvedilol  and Cardizem .   Lasix  and losartan  remain on hold due to AKI. Blood pressure remains controlled Continue to monitor blood pressure.   IV hydralazine as needed   HLD PTA meds- aspirin , statin Aspirin  on hold.  Continue Crestor    Mild chronic anemia GERD Hemoglobin baseline between 9 and 10.  Noted a gradual decline.  Likely dilutional from hydration.  Continue to monitor.   Gout Continue allopurinol  and colchicine    Hyponatremia resolved  Estimated body mass index is 25.99 kg/m as calculated from the following:   Height as of this encounter: 5' 5.98 (1.676 m).   Weight as of this encounter: 73 kg.  DVT prophylaxis: heparin  Code Status:full Family Communication: none Disposition Plan:  Status is: Inpatient Remains inpatient appropriate because: acute illness   Consultants: id nephro ortho  Subjective:  Resting in bed no events overnight   Objective: Vitals:   06/01/24 2053 06/01/24 2359 06/02/24 0428 06/02/24 0747  BP: (!) 157/55 (!) 159/69 (!) 152/55 (!) 153/56  Pulse: 65 67 66 66  Resp: 20 20 20    Temp: 98.1 F (36.7 C) 97.8 F (36.6 C) 98.1 F (36.7 C) 98.2 F (36.8 C)  TempSrc: Oral Oral Oral Oral  SpO2: 95% 98% 96% 95%  Weight:      Height:        Intake/Output Summary (Last 24 hours) at 06/02/2024 1151 Last data filed at 06/02/2024 0404 Gross per 24 hour  Intake 807.89 ml  Output 350 ml  Net 457.89 ml   Filed Weights   05/26/24 2126  05/28/24 0903 05/29/24 0545  Weight: 70.8 kg 70.8 kg 73 kg    Examination:  General exam: Appears chronically ill appearing Respiratory system: Clear to auscultation. Respiratory effort normal. Cardiovascular system: reg Gastrointestinal system: Abdomen is nondistended, soft and nontender. No organomegaly or masses felt. Normal bowel sounds heard. Central nervous system: Alert and oriented. No focal neurological deficits. Extremities: left hand covered with dressing  Data Reviewed: I have personally reviewed following labs and imaging studies  CBC: Recent Labs  Lab 05/26/24 2132 05/27/24 0054 05/28/24 0349 05/29/24 0246 05/30/24 0359 05/31/24 0340 06/02/24 0354  WBC 8.1   < > 7.4 5.5 8.0 6.5 6.3  NEUTROABS 5.8  --   --  4.6 5.4 3.8  --   HGB 10.2*   < > 9.1* 9.3* 8.6* 8.2* 8.1*  HCT 32.0*   < > 27.8* 27.8* 26.5* 25.3* 24.7*  MCV 93.0   < > 90.6 90.0 91.4 91.0 90.1  PLT 226   < > 193 181 176 147* 151   < > = values in this interval not displayed.   Basic Metabolic Panel: Recent Labs  Lab 05/29/24 0246 05/30/24 0359 05/31/24 0340 06/01/24 1348 06/02/24 0354  NA 130* 133* 137 138 140  K 5.0 4.6 4.2 4.3 3.8  CL  101 106 105 109 110  CO2 17* 20* 19* 19* 18*  GLUCOSE 193* 83 84 114* 92  BUN 53* 60* 63* 63* 57*  CREATININE 4.22* 4.44* 4.05* 3.27* 2.82*  CALCIUM  8.4* 8.3* 8.4* 8.7* 8.8*   GFR: Estimated Creatinine Clearance: 15.5 mL/min (A) (by C-G formula based on SCr of 2.82 mg/dL (H)). Liver Function Tests: Recent Labs  Lab 05/26/24 2132  AST 30  ALT 17  ALKPHOS 98  BILITOT 0.8  PROT 6.7  ALBUMIN 3.4*   No results for input(s): LIPASE, AMYLASE in the last 168 hours. No results for input(s): AMMONIA in the last 168 hours. Coagulation Profile: Recent Labs  Lab 05/27/24 0051  INR 1.1   Cardiac Enzymes: Recent Labs  Lab 05/26/24 2132 05/29/24 1340  CKTOTAL 162 149   BNP (last 3 results) No results for input(s): PROBNP in the last 8760  hours. HbA1C: No results for input(s): HGBA1C in the last 72 hours. CBG: Recent Labs  Lab 06/01/24 1202 06/01/24 1607 06/01/24 2055 06/02/24 0614 06/02/24 1111  GLUCAP 121* 104* 121* 83 83   Lipid Profile: No results for input(s): CHOL, HDL, LDLCALC, TRIG, CHOLHDL, LDLDIRECT in the last 72 hours. Thyroid  Function Tests: No results for input(s): TSH, T4TOTAL, FREET4, T3FREE, THYROIDAB in the last 72 hours. Anemia Panel: No results for input(s): VITAMINB12, FOLATE, FERRITIN, TIBC, IRON, RETICCTPCT in the last 72 hours. Sepsis Labs: Recent Labs  Lab 05/26/24 2136 05/26/24 2346  LATICACIDVEN 2.8* 1.6    Recent Results (from the past 240 hours)  MRSA Next Gen by PCR, Nasal     Status: None   Collection Time: 05/28/24  8:01 AM   Specimen: Nasal Mucosa; Nasal Swab  Result Value Ref Range Status   MRSA by PCR Next Gen NOT DETECTED NOT DETECTED Final    Comment: (NOTE) The GeneXpert MRSA Assay (FDA approved for NASAL specimens only), is one component of a comprehensive MRSA colonization surveillance program. It is not intended to diagnose MRSA infection nor to guide or monitor treatment for MRSA infections. Test performance is not FDA approved in patients less than 44 years old. Performed at [facility], [address] [place], [name] [address]   Aerobic/Anaerobic Culture w Gram Stain (surgical/deep wound)     Status: None (Preliminary result)   Collection Time: 05/28/24 11:01 AM   Specimen: Wound  Result Value Ref Range Status   Specimen Description WOUND  Final   Special Requests LEFT INDEX FINGER A  Final   Gram Stain   Final    FEW WBC PRESENT, PREDOMINANTLY PMN MODERATE GRAM POSITIVE COCCI Performed at [facility], [address] [place], [name] [address]    Culture   Final    ABUNDANT STAPHYLOCOCCUS AUREUS NO ANAEROBES ISOLATED; CULTURE IN PROGRESS FOR 5 DAYS    Report Status PENDING  Incomplete   Organism  ID, Bacteria STAPHYLOCOCCUS AUREUS  Final      Susceptibility   Staphylococcus aureus - MIC*    CIPROFLOXACIN <=0.5 SENSITIVE Sensitive     ERYTHROMYCIN >=8 RESISTANT Resistant     GENTAMICIN <=0.5 SENSITIVE Sensitive     OXACILLIN 0.5 SENSITIVE Sensitive     TETRACYCLINE <=1 SENSITIVE Sensitive     VANCOMYCIN 1 SENSITIVE Sensitive     TRIMETH/SULFA <=10 SENSITIVE Sensitive     CLINDAMYCIN RESISTANT Resistant     RIFAMPIN <=0.5 SENSITIVE Sensitive     Inducible Clindamycin POSITIVE Resistant     LINEZOLID 2 SENSITIVE Sensitive     *  ABUNDANT STAPHYLOCOCCUS AUREUS  Aerobic/Anaerobic Culture w Gram Stain (surgical/deep wound)     Status: None (Preliminary result)   Collection Time: 05/28/24 11:26 AM   Specimen: Wound  Result Value Ref Range Status   Specimen Description WOUND  Final   Special Requests LEFT INDEX FINGER B  Final   Gram Stain   Final    RARE WBC SEEN RARE GRAM POSITIVE COCCI Performed at [facility], [address] [place], [name] [address]    Culture   Final    RARE STAPHYLOCOCCUS AUREUS NO ANAEROBES ISOLATED; CULTURE IN PROGRESS FOR 5 DAYS    Report Status PENDING  Incomplete   Organism ID, Bacteria STAPHYLOCOCCUS AUREUS  Final      Susceptibility   Staphylococcus aureus - MIC*    CIPROFLOXACIN <=0.5 SENSITIVE Sensitive     ERYTHROMYCIN >=8 RESISTANT Resistant     GENTAMICIN <=0.5 SENSITIVE Sensitive     OXACILLIN 0.5 SENSITIVE Sensitive     TETRACYCLINE <=1 SENSITIVE Sensitive     VANCOMYCIN 1 SENSITIVE Sensitive     TRIMETH/SULFA <=10 SENSITIVE Sensitive     CLINDAMYCIN RESISTANT Resistant     RIFAMPIN <=0.5 SENSITIVE Sensitive     Inducible Clindamycin POSITIVE Resistant     LINEZOLID 2 SENSITIVE Sensitive     * RARE STAPHYLOCOCCUS AUREUS  Aerobic/Anaerobic Culture w Gram Stain (surgical/deep wound)     Status: None (Preliminary result)   Collection Time: 05/28/24 11:45 AM   Specimen: Finger, Left; Tissue  Result Value Ref Range Status    Specimen Description TISSUE  Final   Special Requests LEFT FINGER C  Final   Gram Stain   Final    NO WBC SEEN RARE GRAM POSITIVE COCCI Performed at [facility], [address] [place], [name] [address]    Culture   Final    FEW STAPHYLOCOCCUS AUREUS SUSCEPTIBILITIES PERFORMED ON PREVIOUS CULTURE WITHIN THE LAST 5 DAYS. NO ANAEROBES ISOLATED; CULTURE IN PROGRESS FOR 5 DAYS    Report Status PENDING  Incomplete     Radiology Studies: No results found.   Scheduled Meds:  allopurinol   300 mg Oral Daily   carvedilol   12.5 mg Oral BID WC   colchicine   0.3 mg Oral Daily   diltiazem   120 mg Oral Daily   feeding supplement  237 mL Oral BID BM   heparin  injection (subcutaneous)  5,000 Units Subcutaneous Q8H   insulin  aspart  0-5 Units Subcutaneous QHS   insulin  aspart  0-9 Units Subcutaneous TID WC   pantoprazole   20 mg Oral Daily   rosuvastatin   5 mg Oral q1800   sertraline   25 mg Oral Daily   sodium chloride  flush  3 mL Intravenous Q12H   Continuous Infusions:  sodium chloride  50 mL/hr at 06/02/24 0404   DAPTOmycin Stopped (05/31/24 1354)     LOS: 6 days   Almarie KANDICE Hoots, MD  06/02/2024, 11:51 AM

## 2024-06-03 DIAGNOSIS — N179 Acute kidney failure, unspecified: Secondary | ICD-10-CM | POA: Diagnosis not present

## 2024-06-03 DIAGNOSIS — N189 Chronic kidney disease, unspecified: Secondary | ICD-10-CM | POA: Diagnosis not present

## 2024-06-03 LAB — CBC
HCT: 26.4 % — ABNORMAL LOW (ref 36.0–46.0)
Hemoglobin: 8.5 g/dL — ABNORMAL LOW (ref 12.0–15.0)
MCH: 29.7 pg (ref 26.0–34.0)
MCHC: 32.2 g/dL (ref 30.0–36.0)
MCV: 92.3 fL (ref 80.0–100.0)
Platelets: 165 K/uL (ref 150–400)
RBC: 2.86 MIL/uL — ABNORMAL LOW (ref 3.87–5.11)
RDW: 15.6 % — ABNORMAL HIGH (ref 11.5–15.5)
WBC: 7.1 K/uL (ref 4.0–10.5)
nRBC: 0 % (ref 0.0–0.2)

## 2024-06-03 LAB — COMPREHENSIVE METABOLIC PANEL WITH GFR
ALT: 29 U/L (ref 0–44)
AST: 37 U/L (ref 15–41)
Albumin: 3 g/dL — ABNORMAL LOW (ref 3.5–5.0)
Alkaline Phosphatase: 65 U/L (ref 38–126)
Anion gap: 10 (ref 5–15)
BUN: 37 mg/dL — ABNORMAL HIGH (ref 8–23)
CO2: 19 mmol/L — ABNORMAL LOW (ref 22–32)
Calcium: 8.9 mg/dL (ref 8.9–10.3)
Chloride: 114 mmol/L — ABNORMAL HIGH (ref 98–111)
Creatinine, Ser: 1.99 mg/dL — ABNORMAL HIGH (ref 0.44–1.00)
GFR, Estimated: 24 mL/min — ABNORMAL LOW (ref >60)
Glucose, Bld: 139 mg/dL — ABNORMAL HIGH (ref 70–99)
Potassium: 4 mmol/L (ref 3.5–5.1)
Sodium: 143 mmol/L (ref 135–145)
Total Bilirubin: 0.7 mg/dL (ref 0.0–1.2)
Total Protein: 5.6 g/dL — ABNORMAL LOW (ref 6.5–8.1)

## 2024-06-03 LAB — GLUCOSE, CAPILLARY
Glucose-Capillary: 79 mg/dL (ref 70–99)
Glucose-Capillary: 88 mg/dL (ref 70–99)
Glucose-Capillary: 101 mg/dL — ABNORMAL HIGH (ref 70–99)
Glucose-Capillary: 106 mg/dL — ABNORMAL HIGH (ref 70–99)
Glucose-Capillary: 138 mg/dL — ABNORMAL HIGH (ref 70–99)
Glucose-Capillary: 127 mg/dL — ABNORMAL HIGH (ref 70–99)

## 2024-06-03 MED ORDER — SODIUM CHLORIDE 0.9 % IV SOLN
INTRAVENOUS | Status: AC
Start: 2024-06-03 — End: 2024-06-04

## 2024-06-03 MED ORDER — DILTIAZEM HCL 30 MG PO TABS
30.0000 mg | ORAL_TABLET | Freq: Two times a day (BID) | ORAL | Status: DC
  Administered 2024-06-03 – 2024-06-04 (×3): 30 mg via ORAL
  Filled 2024-06-03 (×3): qty 1

## 2024-06-03 NOTE — Plan of Care (Signed)
   Problem: Education: Goal: Knowledge of General Education information will improve Description Including pain rating scale, medication(s)/side effects and non-pharmacologic comfort measures Outcome: Progressing

## 2024-06-03 NOTE — TOC Progression Note (Signed)
 Transition of Care [facility]) - Progression Note    Patient Details  Name: Yvonne Newton MRN: [id number] Date of Birth: 05-13-1941  [facility] CM/SW Contact  [name], [name] Phone Number: 06/03/2024, 1:32 PM  Clinical Narrative:     CSW called [employer]- authorization still pending   Montie Louder, MSW, LCSW Clinical Social Worker    Expected Discharge Plan: Skilled Nursing Facility Barriers to Discharge: Continued Medical Work up               Expected Discharge Plan and Services In-house Referral: Clinical Social Work                                             Social Drivers of Health (SDOH) Interventions SDOH Screenings   Food Insecurity: Patient Declined (05/27/2024)  Housing: Unknown (05/27/2024)  Transportation Needs: Patient Declined (05/27/2024)  Utilities: Patient Declined (05/27/2024)  Financial Resource Strain: Low Risk  (05/03/2024)   Received from [facility]  Social Connections: Patient Declined (05/27/2024)  Tobacco Use: Medium Risk (05/28/2024)    Readmission Risk Interventions     No data to display

## 2024-06-03 NOTE — TOC Progression Note (Signed)
 Transition of Care [facility]) - Progression Note    Patient Details  Name: Yvonne Newton MRN: [id number] Date of Birth: August 07, 1941  [facility]) CM/SW Contact  [name], [name] Phone Number: 06/03/2024, 3:56 PM  Clinical Narrative:     Received call from insurance - they have denied SNF. Called Patient's son, advised insurance has denied rehab at [facility] but [facility] was approved. He states he plans to appeal decision. Denial notice left in the room with the patient.   TOC will continue to follow and assist with discharge planning.   Montie Louder, MSW, LCSW Clinical Social Worker    Expected Discharge Plan: Skilled Nursing Facility Barriers to Discharge: Insurance Authorization               Expected Discharge Plan and Services In-house Referral: Clinical Social Work                                             Social Drivers of Health (SDOH) Interventions SDOH Screenings   Food Insecurity: Patient Declined (05/27/2024)  Housing: Unknown (05/27/2024)  Transportation Needs: Patient Declined (05/27/2024)  Utilities: Patient Declined (05/27/2024)  Financial Resource Strain: Low Risk  (05/03/2024)   Received from [facility]  Social Connections: Patient Declined (05/27/2024)  Tobacco Use: Medium Risk (05/28/2024)    Readmission Risk Interventions     No data to display

## 2024-06-03 NOTE — Progress Notes (Signed)
 Physical Therapy Treatment Patient Details Name: Yvonne Newton MRN: [id number] DOB: 1941-06-27 Today's Date: 06/03/2024   History of Present Illness Yvonne Newton is a 83 y.o. female presented to [facility] ED 05/26/24 after fall at home. CXR suggestive right lower lobe lung infiltration with some bronchiectasis. X-rays of the left hand noted communicated intra-articular fracture at the base of the second distal phalanx and associated soft tissue swelling with possible open wound. 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 PMHx: CHF, T2DM CVA, essential HTN, HLD, OA, dementia, and gouty tophi of bilateral fingers.    PT Comments  Pt is presenting at Min to Mod A for sit to stand, Mod A for transfers and Mod a for bed mobility. Pt has difficulty remembering WB precautions requiring frequent reminders to prevent WB through L hand. Due to pt current functional status, home set up and available assistance at home recommending skilled physical therapy services < 3 hours/day in order to address strength, balance and functional mobility to decrease risk for falls, injury, immobility, skin break down and re-hospitalization.      If plan is discharge home, recommend the following: Assistance with cooking/housework;Help with stairs or ramp for entrance;Supervision due to cognitive status;Assist for transportation;Two people to help with walking and/or transfers   Can travel by private vehicle     No  Equipment Recommendations  Hospital bed;Wheelchair (measurements PT);Hoyer lift;Wheelchair cushion (measurements PT)       Precautions / Restrictions Precautions Precautions: Fall Recall of Precautions/Restrictions: Impaired Precaution/Restrictions Comments: intermittent awareness of WB precautions Restrictions Weight Bearing Restrictions Per Provider Order: Yes LUE Weight Bearing Per Provider Order: Non weight bearing      Mobility  Bed Mobility Overal bed mobility: Needs Assistance Bed Mobility: Sit to Supine       Sit to supine: Mod assist   General bed mobility comments: Mod A for LE to bed    Transfers Overall transfer level: Needs assistance Equipment used: Rolling walker (2 wheels), Straight cane Transfers: Sit to/from Stand, Bed to chair/wheelchair/BSC Sit to Stand: Min assist, Mod assist           General transfer comment: Min A with R hand on RW LUE supported by therapist, Mod A for 2x sit to stand from EOB with [facility] therapist supporting L elbow/gait belt, Mod A for transfer from EOB to [facility] with RUE on RW and LUE elbow/brachium supported by therapist with gait belt at waist.    Ambulation/Gait Ambulation/Gait assistance: Min assist, Mod assist Gait Distance (Feet): 3 Feet Assistive device: Straight cane Gait Pattern/deviations: Step-to pattern, Shuffle Gait velocity: decreased Gait velocity interpretation: <1.31 ft/sec, indicative of household ambulator   General Gait Details: Pt took short slow steps lacking foot clearence. She was unsteady requiring assist to stabilize. Cues for sequencing. Attempting to reach for any surface with L hand forgetting precautions,.      Balance Overall balance assessment: Needs assistance Sitting-balance support: Feet supported Sitting balance-Leahy Scale: Fair     Standing balance support: During functional activity, Single extremity supported, Bilateral upper extremity supported, Reliant on assistive device for balance Standing balance-Leahy Scale: Poor Standing balance comment: Pt dependent on external support of therapist. Mod A to stazilize with functional activities.      Communication Communication Communication: No apparent difficulties  Cognition Arousal: Alert Behavior During Therapy: WFL for tasks assessed/performed, Flat affect   PT - Cognitive impairments: History of cognitive impairments   PT -  Cognition Comments:  fatigued throughout session Following commands: Impaired Following commands impaired: Follows one step commands inconsistently, Follows one step commands with increased time    Cueing Cueing Techniques: Verbal cues, Gestural cues     General Comments General comments (skin integrity, edema, etc.): Pt reporting dizziness in standing BP 145/54 in sitting, 134/55 after standing in sitting and 136/63 in standing.      Pertinent Vitals/Pain Pain Assessment Pain Assessment: Faces Faces Pain Scale: No hurt Breathing: normal Negative Vocalization: none Facial Expression: smiling or inexpressive Body Language: relaxed Consolability: no need to console PAINAD Score: 0 Pain Intervention(s): Monitored during session     PT Goals (current goals can now be found in the care plan section) Acute Rehab PT Goals Patient Stated Goal: Family report to maximize safety and mobility. PT Goal Formulation: With family Time For Goal Achievement: 06/12/24 Potential to Achieve Goals: Fair Progress towards PT goals: Progressing toward goals    Frequency    Min 2X/week      PT Plan  Continue with current POC        AM-PAC PT 6 Clicks Mobility   Outcome Measure  Help needed turning from your back to your side while in a flat bed without using bedrails?: A Little Help needed moving from lying on your back to sitting on the side of a flat bed without using bedrails?: A Little Help needed moving to and from a bed to a chair (including a wheelchair)?: A Lot Help needed standing up from a chair using your arms (e.g., wheelchair or bedside chair)?: A Lot Help needed to walk in hospital room?: Total Help needed climbing 3-5 steps with a railing? : Total 6 Click Score: 12    End of Session Equipment Utilized During Treatment: Gait belt Activity Tolerance: Patient tolerated treatment well;Patient limited by fatigue Patient left: in chair;with call bell/phone within reach;in bed;with bed alarm  set Nurse Communication: Mobility status PT Visit Diagnosis: Other abnormalities of gait and mobility (R26.89);Unsteadiness on feet (R26.81)     Time: 8880-8855 PT Time Calculation (min) (ACUTE ONLY): 25 min  Charges:    $Therapeutic Exercise: 8-22 mins $Therapeutic Activity: 8-22 mins PT General Charges $$ ACUTE PT VISIT: 1 Visit                    Dorothyann Maier, DPT, CLT  Acute Rehabilitation Services Office: [phone] (Secure chat preferred)    Dorothyann VEAR Maier 06/03/2024, 11:54 AM

## 2024-06-03 NOTE — Progress Notes (Signed)
 PROGRESS NOTE    Yvonne Newton  FMW:[id number] DOB: 1941/03/08 DOA: 05/26/2024 PCP: [facility]   Brief Narrative:83 y.o. female with PMH significant for dementia DM2, HTN, HLD, CHF, stroke, vitamin B12 deficiency, osteoarthritis 9/25, presented to ED from home after a fall. Per report, patient's dementia has been progressively worsening.  For the last 2 days at home, patient has not been eating or drinking, was more somnolent and also having hallucinations.  On 9/25, patient stood up and fell and is brought to ED.    In the ED, hemodynamically stable, blood glucose level was 89 Initially seen in the ED as code stroke CT head and cervical spine which did not note any acute intracranial or bony cervical abnormality.  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 Labs significant for potassium of 5.6, creatinine of 6.36, lactic acid 2.8 Chest x-ray showed cardiac enlargement with infiltration in right lower lobe lung with some bronchiectasis.  X-rays of the left hand showed communicated intra-articular fracture at the base of the second distal phalanx and associated soft tissue swelling with possible open wound.   Urinalysis noted small amount of hemoglobin, trace leukocytes, few bacteria, and 11-20 squamous epithelial cells/hpf.   Patient was given IV fluid Admitted to [facility] 9/27, underwent I&D of the left i  Assessment & Plan:   Principal Problem:   Acute kidney injury superimposed on chronic kidney disease Active Problems:   Hyperkalemia   Controlled type 2 diabetes mellitus with hypoglycemia, without long-term current use of insulin  (HCC)   Infected finger   Dementia without behavioral disturbance (HCC)   Acute metabolic encephalopathy   Fall at home, initial encounter   Metabolic acidosis   Lactic acidosis   Essential hypertension   Normocytic anemia    Hyperlipidemia, unspecified   Gout   GERD (gastroesophageal reflux disease)   Suppurative tenosynovitis of flexor tendon of left hand   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  Presented with creatinine elevated to 6.36, lactic acid at 2.8 and bicarb low at 16 Likely prerenal secondary to poor intake. With IV hydration, lactic acid, serum bicarb level have improved. Creatinine gradually improving with IV hydration.  2.82 from 4   Communicated fracture of second distal phalanx Left index finger infection with pyogenic flexor tenosynovitis S/p I&D, partial amputation of tip of finger -9/27 Dr Delene Staph aureus on wound culture  Patient presented with swelling and drainage of the left index finger.   Per report, recently started on antibiotics with concern for cellulitis.   X-rays on admission revealed communicated intra-articular fracture at the base of the second distal phalanx and associated soft tissue swelling with possible open wound.  Hand surgery was consulted. 9/27, underwent I&D of the left index finger flexor tendon sheath, release of left carpal tunnel and partial amputation of left index finger Wound culture is growing rare Staph aureus. Currently on IV daptomycin per ID. Start doxy on dc till nov 8th Follow up with dr  vu 10/17 945 am   Acute metabolic encephalopathy Underlying dementia At baseline, disoriented.   In the hospital, she is having intermittent hallucinations, agitation.  Likely hospital induced delirium Brain imaging without acute stroke PTA meds- risperidone, Zoloft  Currently continued on Zoloft .  Not sure if she is compliant to risperidone. Continue delirium  precautions   Fall/syncope and collapse Patient was reported to have fallen/passed out after standing up from bed.   Suspect likely related to orthostatic hypotension given soft blood  pressures.   Type 2 diabetes mellitus Hyperglycemia A1c 5.4 from 05/28/2024 PTA meds-metformin glipizide.  Currently on hold Because of hypoglycemia, patient was placed on D10 drip at 100 mL/h.  With rising blood sugar level, it was subsequently stopped. Blood sugar level currently in range.  Continue SSI Accu-Cheks CBG (last 3)  Recent Labs    06/03/24 0757 06/03/24 0850 06/03/24 1337  GLUCAP 88 101* 106*     Essential hypertension Blood pressures noted to be soft.   PTA meds- Coreg , Cardizem , Lasix , losartan  Currently continued on Cardizem  at decreased dose Lasix  and losartan  remain on hold due to AKI. Blood pressure remains soft Continue to monitor blood pressure.   IV hydralazine as needed   HLD PTA meds- aspirin , statin Aspirin  on hold.  Continue Crestor    Mild chronic anemia GERD Hemoglobin baseline between 9 and 10.  Noted a gradual decline.  Likely dilutional from hydration.  Continue to monitor.   Gout Continue allopurinol  and colchicine    Hyponatremia resolved  Estimated body mass index is 25.99 kg/m as calculated from the following:   Height as of this encounter: 5' 5.98 (1.676 m).   Weight as of this encounter: 73 kg.  DVT prophylaxis: heparin  Code Status:full Family Communication: none Disposition Plan:  Status is: Inpatient Remains inpatient appropriate because: acute illness   Consultants: id nephro ortho  Subjective:poor po intake Soft bp labs pending  Diarrhea improved  Objective: Vitals:   06/03/24 0343 06/03/24 0759 06/03/24 0853 06/03/24 1228  BP: (!) 141/43 (!) 145/59 (!) 145/59 (!) 122/49  Pulse:  61    Resp:  20  20  Temp: 98 F (36.7 C) 97.9 F (36.6 C)    TempSrc: Oral Oral  Oral  SpO2: 99%     Weight:      Height:        Intake/Output Summary (Last 24 hours) at 06/03/2024 1434 Last data filed at 06/02/2024 2224 Gross per 24 hour  Intake 3 ml  Output --  Net 3 ml   Filed Weights   05/26/24 2126 05/28/24 0903  05/29/24 0545  Weight: 70.8 kg 70.8 kg 73 kg    Examination:  General exam: Appears chronically ill appearing Respiratory system: Clear to auscultation. Respiratory effort normal. Cardiovascular system: reg Gastrointestinal system: Abdomen is nondistended, soft and nontender. No organomegaly or masses felt. Normal bowel sounds heard. Central nervous system: Alert and oriented. No focal neurological deficits. Extremities: left hand covered with dressing  Data Reviewed: I have personally reviewed following labs and imaging studies  CBC: Recent Labs  Lab 05/28/24 0349 05/29/24 0246 05/30/24 0359 05/31/24 0340 06/02/24 0354  WBC 7.4 5.5 8.0 6.5 6.3  NEUTROABS  --  4.6 5.4 3.8  --   HGB 9.1* 9.3* 8.6* 8.2* 8.1*  HCT 27.8* 27.8* 26.5* 25.3* 24.7*  MCV 90.6 90.0 91.4 91.0 90.1  PLT 193 181 176 147* 151   Basic Metabolic Panel: Recent Labs  Lab 05/29/24 0246 05/30/24 0359 05/31/24 0340 06/01/24 1348 06/02/24 0354  NA 130* 133* 137 138 140  K 5.0 4.6 4.2 4.3 3.8  CL 101 106 105 109 110  CO2 17* 20* 19* 19* 18*  GLUCOSE 193* 83 84 114* 92  BUN 53* 60* 63* 63* 57*  CREATININE 4.22* 4.44* 4.05* 3.27* 2.82*  CALCIUM  8.4* 8.3* 8.4* 8.7*  8.8*   GFR: Estimated Creatinine Clearance: 15.5 mL/min (A) (by C-G formula based on SCr of 2.82 mg/dL (H)). Liver Function Tests: No results for input(s): AST, ALT, ALKPHOS, BILITOT, PROT, ALBUMIN in the last 168 hours.  No results for input(s): LIPASE, AMYLASE in the last 168 hours. No results for input(s): AMMONIA in the last 168 hours. Coagulation Profile: No results for input(s): INR, PROTIME in the last 168 hours.  Cardiac Enzymes: Recent Labs  Lab 05/29/24 1340  CKTOTAL 149   BNP (last 3 results) No results for input(s): PROBNP in the last 8760 hours. HbA1C: No results for input(s): HGBA1C in the last 72 hours. CBG: Recent Labs  Lab 06/02/24 2219 06/03/24 0608 06/03/24 0757 06/03/24 0850  06/03/24 1337  GLUCAP 104* 79 88 101* 106*   Lipid Profile: No results for input(s): CHOL, HDL, LDLCALC, TRIG, CHOLHDL, LDLDIRECT in the last 72 hours. Thyroid  Function Tests: No results for input(s): TSH, T4TOTAL, FREET4, T3FREE, THYROIDAB in the last 72 hours. Anemia Panel: No results for input(s): VITAMINB12, FOLATE, FERRITIN, TIBC, IRON, RETICCTPCT in the last 72 hours. Sepsis Labs: No results for input(s): PROCALCITON, LATICACIDVEN in the last 168 hours.   Recent Results (from the past 240 hours)  MRSA Next Gen by PCR, Nasal     Status: None   Collection Time: 05/28/24  8:01 AM   Specimen: Nasal Mucosa; Nasal Swab  Result Value Ref Range Status   MRSA by PCR Next Gen NOT DETECTED NOT DETECTED Final    Comment: (NOTE) The GeneXpert MRSA Assay (FDA approved for NASAL specimens only), is one component of a comprehensive MRSA colonization surveillance program. It is not intended to diagnose MRSA infection nor to guide or monitor treatment for MRSA infections. Test performance is not FDA approved in patients less than 71 years old. Performed at [facility], [address] [place], [name] [address]   Aerobic/Anaerobic Culture w Gram Stain (surgical/deep wound)     Status: None   Collection Time: 05/28/24 11:01 AM   Specimen: Wound  Result Value Ref Range Status   Specimen Description WOUND  Final   Special Requests LEFT INDEX FINGER A  Final   Gram Stain   Final    FEW WBC PRESENT, PREDOMINANTLY PMN MODERATE GRAM POSITIVE COCCI    Culture   Final    ABUNDANT STAPHYLOCOCCUS AUREUS NO ANAEROBES ISOLATED Performed at [facility], [address] [place], [name] [address]    Report Status 06/02/2024 FINAL  Final   Organism ID, Bacteria STAPHYLOCOCCUS AUREUS  Final      Susceptibility   Staphylococcus aureus - MIC*    CIPROFLOXACIN <=0.5 SENSITIVE Sensitive     ERYTHROMYCIN >=8 RESISTANT Resistant     GENTAMICIN <=0.5  SENSITIVE Sensitive     OXACILLIN 0.5 SENSITIVE Sensitive     TETRACYCLINE <=1 SENSITIVE Sensitive     VANCOMYCIN 1 SENSITIVE Sensitive     TRIMETH/SULFA <=10 SENSITIVE Sensitive     CLINDAMYCIN RESISTANT Resistant     RIFAMPIN <=0.5 SENSITIVE Sensitive     Inducible Clindamycin POSITIVE Resistant     LINEZOLID 2 SENSITIVE Sensitive     * ABUNDANT STAPHYLOCOCCUS AUREUS  Aerobic/Anaerobic Culture w Gram Stain (surgical/deep wound)     Status: None   Collection Time: 05/28/24 11:26 AM   Specimen: Wound  Result Value Ref Range Status   Specimen Description WOUND  Final   Special Requests LEFT INDEX FINGER B  Final   Gram Stain RARE WBC  SEEN RARE GRAM POSITIVE COCCI   Final   Culture   Final    RARE STAPHYLOCOCCUS AUREUS NO ANAEROBES ISOLATED Performed at [facility], [address], [place], [name] [address]    Report Status 06/02/2024 FINAL  Final   Organism ID, Bacteria STAPHYLOCOCCUS AUREUS  Final      Susceptibility   Staphylococcus aureus - MIC*    CIPROFLOXACIN <=0.5 SENSITIVE Sensitive     ERYTHROMYCIN >=8 RESISTANT Resistant     GENTAMICIN <=0.5 SENSITIVE Sensitive     OXACILLIN 0.5 SENSITIVE Sensitive     TETRACYCLINE <=1 SENSITIVE Sensitive     VANCOMYCIN 1 SENSITIVE Sensitive     TRIMETH/SULFA <=10 SENSITIVE Sensitive     CLINDAMYCIN RESISTANT Resistant     RIFAMPIN <=0.5 SENSITIVE Sensitive     Inducible Clindamycin POSITIVE Resistant     LINEZOLID 2 SENSITIVE Sensitive     * RARE STAPHYLOCOCCUS AUREUS  Aerobic/Anaerobic Culture w Gram Stain (surgical/deep wound)     Status: None   Collection Time: 05/28/24 11:45 AM   Specimen: Finger, Left; Tissue  Result Value Ref Range Status   Specimen Description TISSUE  Final   Special Requests LEFT FINGER C  Final   Gram Stain NO WBC SEEN RARE GRAM POSITIVE COCCI   Final   Culture   Final    FEW STAPHYLOCOCCUS AUREUS SUSCEPTIBILITIES PERFORMED ON PREVIOUS CULTURE WITHIN THE LAST 5 DAYS. NO ANAEROBES  ISOLATED Performed at [facility], [address] [place], [name] [address]    Report Status 06/02/2024 FINAL  Final     Radiology Studies: No results found.   Scheduled Meds:  allopurinol   300 mg Oral Daily   carvedilol   12.5 mg Oral BID WC   colchicine   0.3 mg Oral Daily   diltiazem   120 mg Oral Daily   feeding supplement  237 mL Oral BID BM   heparin  injection (subcutaneous)  5,000 Units Subcutaneous Q8H   insulin  aspart  0-5 Units Subcutaneous QHS   insulin  aspart  0-9 Units Subcutaneous TID WC   pantoprazole   20 mg Oral Daily   rosuvastatin   5 mg Oral q1800   sertraline   25 mg Oral Daily   sodium chloride  flush  3 mL Intravenous Q12H   Continuous Infusions:  DAPTOmycin 600 mg (06/02/24 1208)     LOS: 7 days   Almarie KANDICE Hoots, MD  06/03/2024, 2:34 PM

## 2024-06-04 DIAGNOSIS — N179 Acute kidney failure, unspecified: Secondary | ICD-10-CM | POA: Diagnosis not present

## 2024-06-04 DIAGNOSIS — N189 Chronic kidney disease, unspecified: Secondary | ICD-10-CM | POA: Diagnosis not present

## 2024-06-04 LAB — COMPREHENSIVE METABOLIC PANEL WITH GFR
ALT: 28 U/L (ref 0–44)
AST: 32 U/L (ref 15–41)
Albumin: 2.8 g/dL — ABNORMAL LOW (ref 3.5–5.0)
Alkaline Phosphatase: 62 U/L (ref 38–126)
Anion gap: 7 (ref 5–15)
BUN: 31 mg/dL — ABNORMAL HIGH (ref 8–23)
CO2: 20 mmol/L — ABNORMAL LOW (ref 22–32)
Calcium: 8.7 mg/dL — ABNORMAL LOW (ref 8.9–10.3)
Chloride: 116 mmol/L — ABNORMAL HIGH (ref 98–111)
Creatinine, Ser: 1.8 mg/dL — ABNORMAL HIGH (ref 0.44–1.00)
GFR, Estimated: 28 mL/min — ABNORMAL LOW (ref >60)
Glucose, Bld: 103 mg/dL — ABNORMAL HIGH (ref 70–99)
Potassium: 3.8 mmol/L (ref 3.5–5.1)
Sodium: 143 mmol/L (ref 135–145)
Total Bilirubin: 0.7 mg/dL (ref 0.0–1.2)
Total Protein: 5.4 g/dL — ABNORMAL LOW (ref 6.5–8.1)

## 2024-06-04 LAB — CBC
HCT: 24.4 % — ABNORMAL LOW (ref 36.0–46.0)
Hemoglobin: 7.9 g/dL — ABNORMAL LOW (ref 12.0–15.0)
MCH: 29.3 pg (ref 26.0–34.0)
MCHC: 32.4 g/dL (ref 30.0–36.0)
MCV: 90.4 fL (ref 80.0–100.0)
Platelets: 163 K/uL (ref 150–400)
RBC: 2.7 MIL/uL — ABNORMAL LOW (ref 3.87–5.11)
RDW: 15.5 % (ref 11.5–15.5)
WBC: 8.9 K/uL (ref 4.0–10.5)
nRBC: 0 % (ref 0.0–0.2)

## 2024-06-04 LAB — CK: Total CK: 65 U/L (ref 38–234)

## 2024-06-04 LAB — GLUCOSE, CAPILLARY
Glucose-Capillary: 94 mg/dL (ref 70–99)
Glucose-Capillary: 120 mg/dL — ABNORMAL HIGH (ref 70–99)
Glucose-Capillary: 149 mg/dL — ABNORMAL HIGH (ref 70–99)
Glucose-Capillary: 144 mg/dL — ABNORMAL HIGH (ref 70–99)

## 2024-06-04 MED ORDER — AMLODIPINE BESYLATE 5 MG PO TABS
2.5000 mg | ORAL_TABLET | Freq: Every day | ORAL | Status: DC
  Administered 2024-06-04 – 2024-06-05 (×2): 2.5 mg via ORAL
  Filled 2024-06-04 (×2): qty 1

## 2024-06-04 NOTE — TOC Progression Note (Signed)
 Transition of Care [facility]) - Progression Note    Patient Details  Name: Yvonne Newton MRN: [id number] Date of Birth: 1940-12-26  Transition of [facility]) CM/SW Contact  [name], [name] Phone Number: 06/04/2024, 11:16 AM  Clinical Narrative:     CSW received message from Dr Will, she was informed by [employer] Medical Director Dr. Janit he will reconsider for possible rehab at [facility] and requested CSW to re-submit authorization for SNF.   Called HTA/Connie- left voice message, requested call back.   Montie Louder, MSW, LCSW Clinical Social Worker    Expected Discharge Plan: Skilled Nursing Facility Barriers to Discharge: Insurance Authorization               Expected Discharge Plan and Services In-house Referral: Clinical Social Work                                             Social Drivers of Health (SDOH) Interventions SDOH Screenings   Food Insecurity: Patient Declined (05/27/2024)  Housing: Unknown (05/27/2024)  Transportation Needs: Patient Declined (05/27/2024)  Utilities: Patient Declined (05/27/2024)  Financial Resource Strain: Low Risk  (05/03/2024)   Received from [facility]  Social Connections: Patient Declined (05/27/2024)  Tobacco Use: Medium Risk (05/28/2024)    Readmission Risk Interventions     No data to display

## 2024-06-04 NOTE — Progress Notes (Signed)
 PROGRESS NOTE    Yvonne Newton  FMW:[id number] DOB: September 22, 1940 DOA: 05/26/2024 PCP: [facility]   Brief Narrative:83 y.o. female with PMH significant for dementia DM2, HTN, HLD, CHF, stroke, vitamin B12 deficiency, osteoarthritis 9/25, presented to ED from home after a fall. Per report, patient's dementia has been progressively worsening.  For the last 2 days at home, patient has not been eating or drinking, was more somnolent and also having hallucinations.  On 9/25, patient stood up and fell and is brought to ED.    In the ED, hemodynamically stable, blood glucose level was 89 Initially seen in the ED as code stroke CT head and cervical spine which did not note any acute intracranial or bony cervical abnormality.  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 Labs significant for potassium of 5.6, creatinine of 6.36, lactic acid 2.8 Chest x-ray showed cardiac enlargement with infiltration in right lower lobe lung with some bronchiectasis.  X-rays of the left hand showed communicated intra-articular fracture at the base of the second distal phalanx and associated soft tissue swelling with possible open wound.   Urinalysis noted small amount of hemoglobin, trace leukocytes, few bacteria, and 11-20 squamous epithelial cells/hpf.   Patient was given IV fluid Admitted to [facility] 9/27, underwent I&D of the left i  Assessment & Plan:   Principal Problem:   Acute kidney injury superimposed on chronic kidney disease Active Problems:   Hyperkalemia   Controlled type 2 diabetes mellitus with hypoglycemia, without long-term current use of insulin  (HCC)   Infected finger   Dementia without behavioral disturbance (HCC)   Acute metabolic encephalopathy   Fall at home, initial encounter   Metabolic acidosis   Lactic acidosis   Essential hypertension   Normocytic anemia    Hyperlipidemia, unspecified   Gout   GERD (gastroesophageal reflux disease)   Suppurative tenosynovitis of flexor tendon of left hand   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  Presented with creatinine elevated to 6.36, lactic acid at 2.8 and bicarb low at 16 Likely prerenal secondary to poor intake. With IV hydration, lactic acid, serum bicarb level have improved. Creatinine gradually improving with IV hydration. 1.80 from  2.82 from 4   Communicated fracture of second distal phalanx Left index finger infection with pyogenic flexor tenosynovitis S/p I&D, partial amputation of tip of finger -9/27 Dr Delene Staph aureus on wound culture  Patient presented with swelling and drainage of the left index finger.   Per report, recently started on antibiotics with concern for cellulitis.   X-rays on admission revealed communicated intra-articular fracture at the base of the second distal phalanx and associated soft tissue swelling with possible open wound.  Hand surgery was consulted. 9/27, underwent I&D of the left index finger flexor tendon sheath, release of left carpal tunnel and partial amputation of left index finger Wound culture is growing rare Staph aureus. Currently on IV daptomycin per ID. Start doxy on dc till nov 8th Follow up with dr  vu 10/17 945 am   Acute metabolic encephalopathy Underlying dementia At baseline, disoriented.   In the hospital, she is having intermittent hallucinations, agitation.  Likely hospital induced delirium Brain imaging without acute stroke PTA meds- risperidone, Zoloft  Currently continued on Zoloft .  Not sure if she is compliant to risperidone.  Continue delirium precautions   Fall/syncope and collapse Patient was reported to have fallen/passed out after standing up from bed.   Suspect likely related to orthostatic hypotension given soft  blood pressures.   Type 2 diabetes mellitus Hyperglycemia A1c 5.4 from 05/28/2024 PTA meds-metformin glipizide.  Currently on hold Because of hypoglycemia, patient was placed on D10 drip at 100 mL/h.  With rising blood sugar level, it was subsequently stopped. Blood sugar level currently in range.  Continue SSI Accu-Cheks CBG (last 3)  Recent Labs    06/03/24 1622 06/03/24 2255 06/04/24 0608  GLUCAP 138* 127* 94     Essential hypertension Blood pressures noted to be soft.   PTA meds- Coreg , Cardizem , Lasix , losartan  Currently continued on Cardizem  at decreased dose Lasix  and losartan  remain on hold due to AKI. Blood pressure remains soft Continue to monitor blood pressure.   IV hydralazine as needed   HLD PTA meds- aspirin , statin Aspirin  on hold.  Continue Crestor    Mild chronic anemia GERD Hemoglobin baseline between 9 and 10.  Noted a gradual decline.  Likely dilutional from hydration.  Continue to monitor.   Gout Continue allopurinol  and colchicine    Hyponatremia resolved  Estimated body mass index is 25.99 kg/m as calculated from the following:   Height as of this encounter: 5' 5.98 (1.676 m).   Weight as of this encounter: 73 kg.  DVT prophylaxis: heparin  Code Status:full Family Communication: none Disposition Plan:  Status is: Inpatient Remains inpatient appropriate because: acute illness   Consultants: id nephro ortho  Subjective: Awake bp imrproved  Objective: Vitals:   06/03/24 2354 06/04/24 0300 06/04/24 0748 06/04/24 0827  BP: (!) 171/45 (!) 149/50 (!) 153/51 (!) 158/52  Pulse: 68 (!) 51 (!) 58 78  Resp: 16 18    Temp: 97.8 F (36.6 C) 97.8 F (36.6 C) 98.2 F (36.8 C)   TempSrc: Oral Oral Oral   SpO2: 96% 95% 95%   Weight:      Height:        Intake/Output Summary (Last 24 hours) at 06/04/2024 1148 Last data filed at 06/04/2024 0800 Gross per 24 hour  Intake 398.57 ml  Output --  Net 398.57 ml   Filed Weights   05/26/24 2126  05/28/24 0903 05/29/24 0545  Weight: 70.8 kg 70.8 kg 73 kg    Examination:  General exam: Appears chronically ill appearing Respiratory system: Clear to auscultation. Respiratory effort normal. Cardiovascular system: reg Gastrointestinal system: Abdomen is nondistended, soft and nontender. No organomegaly or masses felt. Normal bowel sounds heard. Central nervous system: Alert and oriented. No focal neurological deficits. Extremities: left hand covered with dressing  Data Reviewed: I have personally reviewed following labs and imaging studies  CBC: Recent Labs  Lab 05/29/24 0246 05/30/24 0359 05/31/24 0340 06/02/24 0354 06/03/24 1540 06/04/24 0340  WBC 5.5 8.0 6.5 6.3 7.1 8.9  NEUTROABS 4.6 5.4 3.8  --   --   --   HGB 9.3* 8.6* 8.2* 8.1* 8.5* 7.9*  HCT 27.8* 26.5* 25.3* 24.7* 26.4* 24.4*  MCV 90.0 91.4 91.0 90.1 92.3 90.4  PLT 181 176 147* 151 165 163   Basic Metabolic Panel: Recent Labs  Lab 05/31/24 0340 06/01/24 1348 06/02/24 0354 06/03/24 1540 06/04/24 0340  NA 137 138 140 143 143  K 4.2 4.3 3.8 4.0 3.8  CL 105 109 110 114* 116*  CO2 19* 19* 18* 19* 20*  GLUCOSE 84 114* 92 139* 103*  BUN 63* 63* 57* 37* 31*  CREATININE  4.05* 3.27* 2.82* 1.99* 1.80*  CALCIUM  8.4* 8.7* 8.8* 8.9 8.7*   GFR: Estimated Creatinine Clearance: 24.2 mL/min (A) (by C-G formula based on SCr of 1.8 mg/dL (H)). Liver Function Tests: Recent Labs  Lab 06/03/24 1540 06/04/24 0340  AST 37 32  ALT 29 28  ALKPHOS 65 62  BILITOT 0.7 0.7  PROT 5.6* 5.4*  ALBUMIN 3.0* 2.8*    No results for input(s): LIPASE, AMYLASE in the last 168 hours. No results for input(s): AMMONIA in the last 168 hours. Coagulation Profile: No results for input(s): INR, PROTIME in the last 168 hours.  Cardiac Enzymes: Recent Labs  Lab 05/29/24 1340 06/04/24 0340  CKTOTAL 149 65   BNP (last 3 results) No results for input(s): PROBNP in the last 8760 hours. HbA1C: No results for input(s):  HGBA1C in the last 72 hours. CBG: Recent Labs  Lab 06/03/24 0850 06/03/24 1337 06/03/24 1622 06/03/24 2255 06/04/24 0608  GLUCAP 101* 106* 138* 127* 94   Lipid Profile: No results for input(s): CHOL, HDL, LDLCALC, TRIG, CHOLHDL, LDLDIRECT in the last 72 hours. Thyroid  Function Tests: No results for input(s): TSH, T4TOTAL, FREET4, T3FREE, THYROIDAB in the last 72 hours. Anemia Panel: No results for input(s): VITAMINB12, FOLATE, FERRITIN, TIBC, IRON, RETICCTPCT in the last 72 hours. Sepsis Labs: No results for input(s): PROCALCITON, LATICACIDVEN in the last 168 hours.   Recent Results (from the past 240 hours)  MRSA Next Gen by PCR, Nasal     Status: None   Collection Time: 05/28/24  8:01 AM   Specimen: Nasal Mucosa; Nasal Swab  Result Value Ref Range Status   MRSA by PCR Next Gen NOT DETECTED NOT DETECTED Final    Comment: (NOTE) The GeneXpert MRSA Assay (FDA approved for NASAL specimens only), is one component of a comprehensive MRSA colonization surveillance program. It is not intended to diagnose MRSA infection nor to guide or monitor treatment for MRSA infections. Test performance is not FDA approved in patients less than 44 years old. Performed at [facility], [address] [place], [name] [address]   Aerobic/Anaerobic Culture w Gram Stain (surgical/deep wound)     Status: None   Collection Time: 05/28/24 11:01 AM   Specimen: Wound  Result Value Ref Range Status   Specimen Description WOUND  Final   Special Requests LEFT INDEX FINGER A  Final   Gram Stain   Final    FEW WBC PRESENT, PREDOMINANTLY PMN MODERATE GRAM POSITIVE COCCI    Culture   Final    ABUNDANT STAPHYLOCOCCUS AUREUS NO ANAEROBES ISOLATED Performed at [facility], [address], [place], [name] [address]    Report Status 06/02/2024 FINAL  Final   Organism ID, Bacteria STAPHYLOCOCCUS AUREUS  Final      Susceptibility   Staphylococcus  aureus - MIC*    CIPROFLOXACIN <=0.5 SENSITIVE Sensitive     ERYTHROMYCIN >=8 RESISTANT Resistant     GENTAMICIN <=0.5 SENSITIVE Sensitive     OXACILLIN 0.5 SENSITIVE Sensitive     TETRACYCLINE <=1 SENSITIVE Sensitive     VANCOMYCIN 1 SENSITIVE Sensitive     TRIMETH/SULFA <=10 SENSITIVE Sensitive     CLINDAMYCIN RESISTANT Resistant     RIFAMPIN <=0.5 SENSITIVE Sensitive     Inducible Clindamycin POSITIVE Resistant     LINEZOLID 2 SENSITIVE Sensitive     * ABUNDANT STAPHYLOCOCCUS AUREUS  Aerobic/Anaerobic Culture w Gram Stain (surgical/deep wound)     Status: None   Collection Time: 05/28/24 11:26 AM  Specimen: Wound  Result Value Ref Range Status   Specimen Description WOUND  Final   Special Requests LEFT INDEX FINGER B  Final   Gram Stain RARE WBC SEEN RARE GRAM POSITIVE COCCI   Final   Culture   Final    RARE STAPHYLOCOCCUS AUREUS NO ANAEROBES ISOLATED Performed at [facility], [address], [place], [name] [address]    Report Status 06/02/2024 FINAL  Final   Organism ID, Bacteria STAPHYLOCOCCUS AUREUS  Final      Susceptibility   Staphylococcus aureus - MIC*    CIPROFLOXACIN <=0.5 SENSITIVE Sensitive     ERYTHROMYCIN >=8 RESISTANT Resistant     GENTAMICIN <=0.5 SENSITIVE Sensitive     OXACILLIN 0.5 SENSITIVE Sensitive     TETRACYCLINE <=1 SENSITIVE Sensitive     VANCOMYCIN 1 SENSITIVE Sensitive     TRIMETH/SULFA <=10 SENSITIVE Sensitive     CLINDAMYCIN RESISTANT Resistant     RIFAMPIN <=0.5 SENSITIVE Sensitive     Inducible Clindamycin POSITIVE Resistant     LINEZOLID 2 SENSITIVE Sensitive     * RARE STAPHYLOCOCCUS AUREUS  Aerobic/Anaerobic Culture w Gram Stain (surgical/deep wound)     Status: None   Collection Time: 05/28/24 11:45 AM   Specimen: Finger, Left; Tissue  Result Value Ref Range Status   Specimen Description TISSUE  Final   Special Requests LEFT FINGER C  Final   Gram Stain NO WBC SEEN RARE GRAM POSITIVE COCCI   Final   Culture   Final     FEW STAPHYLOCOCCUS AUREUS SUSCEPTIBILITIES PERFORMED ON PREVIOUS CULTURE WITHIN THE LAST 5 DAYS. NO ANAEROBES ISOLATED Performed at [facility], [address], [place], [name] [address]    Report Status 06/02/2024 FINAL  Final     Radiology Studies: No results found.   Scheduled Meds:  allopurinol   300 mg Oral Daily   carvedilol   12.5 mg Oral BID WC   colchicine   0.3 mg Oral Daily   diltiazem   30 mg Oral Q12H   feeding supplement  237 mL Oral BID BM   heparin  injection (subcutaneous)  5,000 Units Subcutaneous Q8H   insulin  aspart  0-5 Units Subcutaneous QHS   insulin  aspart  0-9 Units Subcutaneous TID WC   pantoprazole   20 mg Oral Daily   rosuvastatin   5 mg Oral q1800   sertraline   25 mg Oral Daily   sodium chloride  flush  3 mL Intravenous Q12H   Continuous Infusions:  sodium chloride  75 mL/hr at 06/04/24 0148   DAPTOmycin 600 mg (06/02/24 1208)     LOS: 8 days   Yvonne KANDICE Hoots, MD  06/04/2024, 11:48 AM

## 2024-06-05 DIAGNOSIS — N179 Acute kidney failure, unspecified: Secondary | ICD-10-CM | POA: Diagnosis not present

## 2024-06-05 DIAGNOSIS — Z7189 Other specified counseling: Secondary | ICD-10-CM | POA: Diagnosis not present

## 2024-06-05 DIAGNOSIS — Z515 Encounter for palliative care: Secondary | ICD-10-CM

## 2024-06-05 DIAGNOSIS — N189 Chronic kidney disease, unspecified: Secondary | ICD-10-CM | POA: Diagnosis not present

## 2024-06-05 LAB — GLUCOSE, CAPILLARY: Glucose-Capillary: 89 mg/dL (ref 70–99)

## 2024-06-05 MED ORDER — SODIUM CHLORIDE 0.9 % IV SOLN
8.0000 mg/kg | INTRAVENOUS | Status: DC
  Administered 2024-06-06: 600 mg via INTRAVENOUS
  Filled 2024-06-05 (×2): qty 12

## 2024-06-05 MED ORDER — LORAZEPAM 2 MG/ML IJ SOLN
1.0000 mg | INTRAMUSCULAR | Status: DC | PRN
  Administered 2024-06-07: 1 mg via INTRAVENOUS
  Filled 2024-06-05: qty 1

## 2024-06-05 MED ORDER — SODIUM CHLORIDE 0.9 % IV SOLN
8.0000 mg/kg | INTRAVENOUS | Status: DC
  Filled 2024-06-05: qty 12

## 2024-06-05 MED ORDER — POLYVINYL ALCOHOL 1.4 % OP SOLN: 1.0000 [drp] | Freq: Four times a day (QID) | OPHTHALMIC | Status: DC | PRN

## 2024-06-05 MED ORDER — GLYCOPYRROLATE 0.2 MG/ML IJ SOLN: 0.2000 mg | INTRAMUSCULAR | Status: DC | PRN

## 2024-06-05 MED ORDER — ONDANSETRON 4 MG PO TBDP: 4.0000 mg | ORAL_TABLET | Freq: Four times a day (QID) | ORAL | Status: DC | PRN

## 2024-06-05 MED ORDER — HYDROMORPHONE HCL 1 MG/ML IJ SOLN: 0.5000 mg | INTRAMUSCULAR | Status: DC | PRN

## 2024-06-05 MED ORDER — ONDANSETRON HCL 4 MG/2ML IJ SOLN
4.0000 mg | Freq: Four times a day (QID) | INTRAMUSCULAR | Status: DC | PRN
  Administered 2024-06-06: 4 mg via INTRAVENOUS
  Filled 2024-06-05: qty 2

## 2024-06-05 MED ORDER — LORAZEPAM 2 MG/ML PO CONC: 1.0000 mg | ORAL | Status: DC | PRN

## 2024-06-05 MED ORDER — BIOTENE DRY MOUTH MT LIQD: 15.0000 mL | OROMUCOSAL | Status: DC | PRN

## 2024-06-05 MED ORDER — GLYCOPYRROLATE 1 MG PO TABS: 1.0000 mg | ORAL_TABLET | ORAL | Status: DC | PRN

## 2024-06-05 MED ORDER — LORAZEPAM 1 MG PO TABS: 1.0000 mg | ORAL_TABLET | ORAL | Status: DC | PRN

## 2024-06-05 NOTE — Progress Notes (Signed)
 Yvonne Newton [phone] [facility] Liaison Note   Received request for family interest in hospice inpatient unit. Visited patient and spoke with son Yvonne Newton by phone to discuss services and hospice philosophy of care.    Chart reviewed by hospice physician and at this time patient does not meet criteria for hospice inpatient unit.    She is appropriate for hospice services in the home or LTC facility and we would be happy to reassess for inpatient unit appropriateness at a later time if requested.   Update:  Notified of family request for [facility] Palliative services in facility following discharge.????????   ????  Hospital liaison will follow patient for discharge disposition.?  Please call with any hospice or outpatient palliative care related questions.???   ????  Thank you for the opportunity to participate in this patient's care.    Nat Babe BSN, RN [employer] [phone]

## 2024-06-05 NOTE — TOC Progression Note (Addendum)
 Transition of Care [facility]) - Progression Note    Patient Details  Name: Yvonne Newton MRN: [id number] Date of Birth: 02-18-1941  Transition of Care [facility]) CM/SW Contact  Isaiah Public, LCSWA Phone Number: 06/05/2024, 11:43 AM  Clinical Narrative:     CSW LVM with Jori with HTA . CSW awaiting call back to restart insurance authorization for SNF for patient. CSW will continue to follow.  Update- TOC received consult for residential hospice placement.Rosaline NP with palliative informed team that patients son Koren  wants to keep her comfortable and for her to transition to the hospice home on [address] or in [place].Palliative made referral to [facility] with [facility] who is following. TOC will continue to follow.  Update- Nat with [facility] informed medical team that patient is Hospice appropriate but not quite IPU appropriate at this time. CSW spoke with michelle with palliative who informed CSW that she spoke with son that he still wants snf for patient. CSW informed team currently awaiting HTA to call back to restart insurance authorization for SNF for patient.    Expected Discharge Plan: Skilled Nursing Facility Barriers to Discharge: Insurance Authorization               Expected Discharge Plan and Services In-house Referral: Clinical Social Work                                             Social Drivers of Health (SDOH) Interventions SDOH Screenings   Food Insecurity: Patient Declined (05/27/2024)  Housing: Unknown (05/27/2024)  Transportation Needs: Patient Declined (05/27/2024)  Utilities: Patient Declined (05/27/2024)  Financial Resource Strain: Low Risk  (05/03/2024)   Received from [facility]  Social Connections: Patient Declined (05/27/2024)  Tobacco Use: Medium Risk (05/28/2024)    Readmission Risk Interventions     No data to display

## 2024-06-05 NOTE — Plan of Care (Signed)
  Problem: Nutrition: Goal: Adequate nutrition will be maintained Outcome: Progressing   Problem: Coping: Goal: Level of anxiety will decrease Outcome: Progressing   Problem: Safety: Goal: Ability to remain free from injury will improve Outcome: Progressing   Problem: Skin Integrity: Goal: Risk for impaired skin integrity will decrease Outcome: Progressing   
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
## 2024-06-05 NOTE — Progress Notes (Addendum)
 PROGRESS NOTE    Yvonne Newton  FMW:[id number] DOB: 18-Jun-1941 DOA: 05/26/2024 PCP: [facility]   Brief Narrative:83 y.o. female with PMH significant for dementia DM2, HTN, HLD, CHF, stroke, vitamin B12 deficiency, osteoarthritis 9/25, presented to ED from home after a fall. Per report, patient's dementia has been progressively worsening.  For the last 2 days at home, patient has not been eating or drinking, was more somnolent and also having hallucinations.  On 9/25, patient stood up and fell and is brought to ED.    In the ED, hemodynamically stable, blood glucose level was 89 Initially seen in the ED as code stroke CT head and cervical spine which did not note any acute intracranial or bony cervical abnormality.  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 Labs significant for potassium of 5.6, creatinine of 6.36, lactic acid 2.8 Chest x-ray showed cardiac enlargement with infiltration in right lower lobe lung with some bronchiectasis.  X-rays of the left hand showed communicated intra-articular fracture at the base of the second distal phalanx and associated soft tissue swelling with possible open wound.   Urinalysis noted small amount of hemoglobin, trace leukocytes, few bacteria, and 11-20 squamous epithelial cells/hpf.   Patient was given IV fluid Admitted to [facility] 9/27, underwent I&D of the left i  Assessment & Plan:   Principal Problem:   Acute kidney injury superimposed on chronic kidney disease Active Problems:   Hyperkalemia   Controlled type 2 diabetes mellitus with hypoglycemia, without long-term current use of insulin  (HCC)   Infected finger   Dementia without behavioral disturbance (HCC)   Acute metabolic encephalopathy   Fall at home, initial encounter   Metabolic acidosis   Lactic acidosis   Essential hypertension   Normocytic anemia    Hyperlipidemia, unspecified   Gout   GERD (gastroesophageal reflux disease)   Suppurative tenosynovitis of flexor tendon of left hand   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  Presented with creatinine elevated to 6.36, lactic acid at 2.8 and bicarb low at 16 Likely prerenal secondary to poor intake. With IV hydration, lactic acid, serum bicarb level have improved. Creatinine gradually improving with IV hydration. 1.80 from  2.82 from 4   Communicated fracture of second distal phalanx Left index finger infection with pyogenic flexor tenosynovitis S/p I&D, partial amputation of tip of finger -9/27 Dr Delene Staph aureus on wound culture  Patient presented with swelling and drainage of the left index finger.   Per report, recently started on antibiotics with concern for cellulitis.   X-rays on admission revealed communicated intra-articular fracture at the base of the second distal phalanx and associated soft tissue swelling with possible open wound.  Hand surgery was consulted. 9/27, underwent I&D of the left index finger flexor tendon sheath, release of left carpal tunnel and partial amputation of left index finger Wound culture is growing rare Staph aureus. Currently on IV daptomycin per ID. Start doxy on dc till nov 8th Follow up with dr  vu 10/17 945 am   Acute metabolic encephalopathy Underlying dementia At baseline, disoriented.   In the hospital, she is having intermittent hallucinations, agitation.  Likely hospital induced delirium Brain imaging without acute stroke PTA meds- risperidone, Zoloft  Currently continued on Zoloft .  Not sure if she is compliant to risperidone.  Continue delirium precautions   Fall/syncope and collapse Patient was reported to have fallen/passed out after standing up from bed.   Suspect likely related to orthostatic hypotension given soft  blood pressures.   Type 2 diabetes mellitus Hyperglycemia A1c 5.4 from 05/28/2024 PTA meds-metformin glipizide.  Currently on hold Because of hypoglycemia, patient was placed on D10 drip at 100 mL/h.  With rising blood sugar level, it was subsequently stopped. Blood sugar level currently in range.  Continue SSI Accu-Cheks CBG (last 3)  Recent Labs    06/04/24 1700 06/04/24 2108 06/05/24 0615  GLUCAP 149* 144* 89     Essential hypertension Blood pressures noted to be soft.   PTA meds- Coreg , Cardizem , Lasix , losartan  Currently continued on Cardizem  at decreased dose Lasix  and losartan  remain on hold due to AKI. Blood pressure remains soft Continue to monitor blood pressure.   IV hydralazine as needed   HLD PTA meds- aspirin , statin Aspirin  on hold.  Continue Crestor    Mild chronic anemia GERD Hemoglobin baseline between 9 and 10.  Noted a gradual decline.  Likely dilutional from hydration.  Continue to monitor.   Gout Continue allopurinol  and colchicine    Hyponatremia resolved  Goals of care appreciate palliative care patient now DNR comfort care Awaiting SNF placement and hospice follow-up at snf  Estimated body mass index is 25.99 kg/m as calculated from the following:   Height as of this encounter: 5' 5.98 (1.676 m).   Weight as of this encounter: 73 kg.  DVT prophylaxis: heparin  Code Status:full Family Communication: son Disposition Plan:  Status is: Inpatient Remains inpatient appropriate because: acute illness   Consultants: id nephro ortho  Subjective:  Working on placement Poor po intake Now dnr comfort  Objective: Vitals:   06/04/24 2353 06/05/24 0449 06/05/24 0809 06/05/24 0811  BP: 130/72 (!) 145/70 (!) 169/67 (!) 169/67  Pulse: 62 (!) 59 65 65  Resp: 18 18 17    Temp: 98 F (36.7 C) 97.9 F (36.6 C) 97.9 F (36.6 C)   TempSrc: Oral Oral Oral   SpO2: 96% 96% 96%   Weight:      Height:       No intake or output data in the 24 hours  ending 06/05/24 1608  Filed Weights   05/26/24 2126 05/28/24 0903 05/29/24 0545  Weight: 70.8 kg 70.8 kg 73 kg    Examination:  General exam: Appears chronically ill appearing Respiratory system: Clear to auscultation. Respiratory effort normal. Cardiovascular system: reg Gastrointestinal system: Abdomen is nondistended, soft and nontender. No organomegaly or masses felt. Normal bowel sounds heard. Central nervous system: Alert and oriented. No focal neurological deficits. Extremities: left hand covered with dressing  Data Reviewed: I have personally reviewed following labs and imaging studies  CBC: Recent Labs  Lab 05/30/24 0359 05/31/24 0340 06/02/24 0354 06/03/24 1540 06/04/24 0340  WBC 8.0 6.5 6.3 7.1 8.9  NEUTROABS 5.4 3.8  --   --   --   HGB 8.6* 8.2* 8.1* 8.5* 7.9*  HCT 26.5* 25.3* 24.7* 26.4* 24.4*  MCV 91.4 91.0 90.1 92.3 90.4  PLT 176 147* 151 165 163   Basic Metabolic Panel: Recent Labs  Lab 05/31/24 0340 06/01/24 1348 06/02/24 0354 06/03/24 1540 06/04/24 0340  NA 137 138 140 143 143  K 4.2 4.3 3.8 4.0 3.8  CL 105 109 110 114* 116*  CO2 19* 19* 18* 19* 20*  GLUCOSE 84 114* 92 139* 103*  BUN 63* 63* 57* 37* 31*  CREATININE 4.05* 3.27*  2.82* 1.99* 1.80*  CALCIUM  8.4* 8.7* 8.8* 8.9 8.7*   GFR: Estimated Creatinine Clearance: 24.2 mL/min (A) (by C-G formula based on SCr of 1.8 mg/dL (H)). Liver Function Tests: Recent Labs  Lab 06/03/24 1540 06/04/24 0340  AST 37 32  ALT 29 28  ALKPHOS 65 62  BILITOT 0.7 0.7  PROT 5.6* 5.4*  ALBUMIN 3.0* 2.8*    No results for input(s): LIPASE, AMYLASE in the last 168 hours. No results for input(s): AMMONIA in the last 168 hours. Coagulation Profile: No results for input(s): INR, PROTIME in the last 168 hours.  Cardiac Enzymes: Recent Labs  Lab 06/04/24 0340  CKTOTAL 65   BNP (last 3 results) No results for input(s): PROBNP in the last 8760 hours. HbA1C: No results for input(s): HGBA1C  in the last 72 hours. CBG: Recent Labs  Lab 06/04/24 0608 06/04/24 1219 06/04/24 1700 06/04/24 2108 06/05/24 0615  GLUCAP 94 120* 149* 144* 89   Lipid Profile: No results for input(s): CHOL, HDL, LDLCALC, TRIG, CHOLHDL, LDLDIRECT in the last 72 hours. Thyroid  Function Tests: No results for input(s): TSH, T4TOTAL, FREET4, T3FREE, THYROIDAB in the last 72 hours. Anemia Panel: No results for input(s): VITAMINB12, FOLATE, FERRITIN, TIBC, IRON, RETICCTPCT in the last 72 hours. Sepsis Labs: No results for input(s): PROCALCITON, LATICACIDVEN in the last 168 hours.   Recent Results (from the past 240 hours)  MRSA Next Gen by PCR, Nasal     Status: None   Collection Time: 05/28/24  8:01 AM   Specimen: Nasal Mucosa; Nasal Swab  Result Value Ref Range Status   MRSA by PCR Next Gen NOT DETECTED NOT DETECTED Final    Comment: (NOTE) The GeneXpert MRSA Assay (FDA approved for NASAL specimens only), is one component of a comprehensive MRSA colonization surveillance program. It is not intended to diagnose MRSA infection nor to guide or monitor treatment for MRSA infections. Test performance is not FDA approved in patients less than 14 years old. Performed at [facility], [address], [place], [name] [address]   Aerobic/Anaerobic Culture w Gram Stain (surgical/deep wound)     Status: None   Collection Time: 05/28/24 11:01 AM   Specimen: Wound  Result Value Ref Range Status   Specimen Description WOUND  Final   Special Requests LEFT INDEX FINGER A  Final   Gram Stain   Final    FEW WBC PRESENT, PREDOMINANTLY PMN MODERATE GRAM POSITIVE COCCI    Culture   Final    ABUNDANT STAPHYLOCOCCUS AUREUS NO ANAEROBES ISOLATED Performed at [facility], [address] [place], [name] [address]    Report Status 06/02/2024 FINAL  Final   Organism ID, Bacteria STAPHYLOCOCCUS AUREUS  Final      Susceptibility   Staphylococcus aureus - MIC*     CIPROFLOXACIN <=0.5 SENSITIVE Sensitive     ERYTHROMYCIN >=8 RESISTANT Resistant     GENTAMICIN <=0.5 SENSITIVE Sensitive     OXACILLIN 0.5 SENSITIVE Sensitive     TETRACYCLINE <=1 SENSITIVE Sensitive     VANCOMYCIN 1 SENSITIVE Sensitive     TRIMETH/SULFA <=10 SENSITIVE Sensitive     CLINDAMYCIN RESISTANT Resistant     RIFAMPIN <=0.5 SENSITIVE Sensitive     Inducible Clindamycin POSITIVE Resistant     LINEZOLID 2 SENSITIVE Sensitive     * ABUNDANT STAPHYLOCOCCUS AUREUS  Aerobic/Anaerobic Culture w Gram Stain (surgical/deep wound)     Status: None   Collection Time: 05/28/24 11:26 AM   Specimen: Wound  Result Value Ref Range Status   Specimen Description WOUND  Final   Special Requests LEFT INDEX FINGER B  Final   Gram Stain RARE WBC SEEN RARE GRAM POSITIVE COCCI   Final   Culture   Final    RARE STAPHYLOCOCCUS AUREUS NO ANAEROBES ISOLATED Performed at [facility], [address], [place], [name] [address]    Report Status 06/02/2024 FINAL  Final   Organism ID, Bacteria STAPHYLOCOCCUS AUREUS  Final      Susceptibility   Staphylococcus aureus - MIC*    CIPROFLOXACIN <=0.5 SENSITIVE Sensitive     ERYTHROMYCIN >=8 RESISTANT Resistant     GENTAMICIN <=0.5 SENSITIVE Sensitive     OXACILLIN 0.5 SENSITIVE Sensitive     TETRACYCLINE <=1 SENSITIVE Sensitive     VANCOMYCIN 1 SENSITIVE Sensitive     TRIMETH/SULFA <=10 SENSITIVE Sensitive     CLINDAMYCIN RESISTANT Resistant     RIFAMPIN <=0.5 SENSITIVE Sensitive     Inducible Clindamycin POSITIVE Resistant     LINEZOLID 2 SENSITIVE Sensitive     * RARE STAPHYLOCOCCUS AUREUS  Aerobic/Anaerobic Culture w Gram Stain (surgical/deep wound)     Status: None   Collection Time: 05/28/24 11:45 AM   Specimen: Finger, Left; Tissue  Result Value Ref Range Status   Specimen Description TISSUE  Final   Special Requests LEFT FINGER C  Final   Gram Stain NO WBC SEEN RARE GRAM POSITIVE COCCI   Final   Culture   Final    FEW  STAPHYLOCOCCUS AUREUS SUSCEPTIBILITIES PERFORMED ON PREVIOUS CULTURE WITHIN THE LAST 5 DAYS. NO ANAEROBES ISOLATED Performed at [facility], [address], [place], [name] [address]    Report Status 06/02/2024 FINAL  Final     Radiology Studies: No results found.   Scheduled Meds:  feeding supplement  237 mL Oral BID BM   Continuous Infusions:  [START ON 06/06/2024] DAPTOmycin       LOS: 9 days   Almarie KANDICE Hoots, MD  06/05/2024, 4:08 PM

## 2024-06-06 DIAGNOSIS — N189 Chronic kidney disease, unspecified: Secondary | ICD-10-CM | POA: Diagnosis not present

## 2024-06-06 DIAGNOSIS — Z7189 Other specified counseling: Secondary | ICD-10-CM | POA: Diagnosis not present

## 2024-06-06 DIAGNOSIS — Z515 Encounter for palliative care: Secondary | ICD-10-CM | POA: Diagnosis not present

## 2024-06-06 DIAGNOSIS — N179 Acute kidney failure, unspecified: Secondary | ICD-10-CM | POA: Diagnosis not present

## 2024-06-06 MED ORDER — COLCHICINE 0.3 MG HALF TABLET
0.3000 mg | ORAL_TABLET | Freq: Every day | ORAL | Status: DC
  Administered 2024-06-06 – 2024-06-08 (×3): 0.3 mg via ORAL
  Filled 2024-06-06 (×3): qty 1

## 2024-06-06 MED ORDER — CARVEDILOL 12.5 MG PO TABS
12.5000 mg | ORAL_TABLET | Freq: Two times a day (BID) | ORAL | Status: DC
  Administered 2024-06-06 – 2024-06-08 (×4): 12.5 mg via ORAL
  Filled 2024-06-06 (×5): qty 1

## 2024-06-06 MED ORDER — AMLODIPINE BESYLATE 5 MG PO TABS
5.0000 mg | ORAL_TABLET | Freq: Every day | ORAL | Status: DC
  Administered 2024-06-07 – 2024-06-08 (×2): 5 mg via ORAL
  Filled 2024-06-06 (×2): qty 1

## 2024-06-06 MED ORDER — AMLODIPINE BESYLATE 5 MG PO TABS
2.5000 mg | ORAL_TABLET | Freq: Every day | ORAL | Status: DC
  Administered 2024-06-06: 2.5 mg via ORAL
  Filled 2024-06-06: qty 1

## 2024-06-06 NOTE — TOC Progression Note (Signed)
 Transition of Care [facility]) - Progression Note    Patient Details  Name: Yvonne Newton MRN: [id number] Date of Birth: 28-Nov-1940  Transition of Care [facility]) CM/SW Contact  Yvonne Newton, [name] Phone Number: 06/06/2024, 12:39 PM  Clinical Narrative:     Spoke with patient's son, Yvonne Newton, he inquired about LTC and how to where to apply Medicaid. He states he will contact Dept of Social Services.  Yvonne Newton, MSW, LCSW Clinical Social Worker     Expected Discharge Plan: Skilled Nursing Facility Barriers to Discharge: Insurance Authorization               Expected Discharge Plan and Services In-house Referral: Clinical Social Work                                             Social Drivers of Health (SDOH) Interventions SDOH Screenings   Food Insecurity: Patient Declined (05/27/2024)  Housing: Unknown (05/27/2024)  Transportation Needs: Patient Declined (05/27/2024)  Utilities: Patient Declined (05/27/2024)  Financial Resource Strain: Low Risk  (05/03/2024)   Received from [facility]  Social Connections: Patient Declined (05/27/2024)  Tobacco Use: Medium Risk (05/28/2024)    Readmission Risk Interventions     No data to display

## 2024-06-06 NOTE — Progress Notes (Signed)
 PROGRESS NOTE    Yvonne Newton  FMW:[id number] DOB: [birth year] DOA: 05/26/2024 PCP: [facility]   Brief Narrative:83 y.o. female with PMH significant for dementia DM2, HTN, HLD, CHF, stroke, vitamin B12 deficiency, osteoarthritis 9/25, presented to ED from home after a fall. Per report, patient's dementia has been progressively worsening.  For the last 2 days at home, patient has not been eating or drinking, was more somnolent and also having hallucinations.  On 9/25, patient stood up and fell and is brought to ED.    In the ED, hemodynamically stable, blood glucose level was 89 Initially seen in the ED as code stroke CT head and cervical spine which did not note any acute intracranial or bony cervical abnormality.  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 Labs significant for potassium of 5.6, creatinine of 6.36, lactic acid 2.8 Chest x-ray showed cardiac enlargement with infiltration in right lower lobe lung with some bronchiectasis.  X-rays of the left hand showed communicated intra-articular fracture at the base of the second distal phalanx and associated soft tissue swelling with possible open wound.   Urinalysis noted small amount of hemoglobin, trace leukocytes, few bacteria, and 11-20 squamous epithelial cells/hpf.   Patient was given IV fluid Admitted to [facility] 9/27, underwent I&D of the left i  Assessment & Plan:   Principal Problem:   Acute kidney injury superimposed on chronic kidney disease Active Problems:   Hyperkalemia   Controlled type 2 diabetes mellitus with hypoglycemia, without long-term current use of insulin  (HCC)   Infected finger   Dementia without behavioral disturbance (HCC)   Acute metabolic encephalopathy   Fall at home, initial encounter   Metabolic acidosis   Lactic acidosis   Essential hypertension   Normocytic anemia    Hyperlipidemia, unspecified   Gout   GERD (gastroesophageal reflux disease)   Suppurative tenosynovitis of flexor tendon of left hand   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  Presented with creatinine elevated to 6.36, lactic acid at 2.8 and bicarb low at 16 Likely prerenal secondary to poor intake. With IV hydration, lactic acid, serum bicarb level have improved. Creatinine gradually improving with IV hydration. 1.80 from  2.82 from 4.   Communicated fracture of second distal phalanx Left index finger infection with pyogenic flexor tenosynovitis S/p I&D, partial amputation of tip of finger -9/27 Dr Delene Staph aureus on wound culture  Patient presented with swelling and drainage of the left index finger.   Per report, recently started on antibiotics with concern for cellulitis.   X-rays on admission revealed communicated intra-articular fracture at the base of the second distal phalanx and associated soft tissue swelling with possible open wound.  Hand surgery was consulted. 9/27, underwent I&D of the left index finger flexor tendon sheath, release of left carpal tunnel and partial amputation of left index finger Wound culture is growing rare Staph aureus. Currently on IV daptomycin per ID. Start doxy on dc till nov 8th Follow up with dr  vu 10/17 945 am   Acute metabolic encephalopathy Underlying dementia At baseline, disoriented.   In the hospital, she is having intermittent hallucinations, agitation.  Likely hospital induced delirium Brain imaging without acute stroke PTA meds- risperidone, Zoloft  Currently continued on Zoloft .  Not sure if she is compliant to risperidone.  Continue delirium precautions   Fall/syncope and collapse Patient was reported to have fallen/passed out after standing up from bed.   Suspect likely related to orthostatic hypotension given soft  blood pressures.   Type 2 diabetes mellitus Hyperglycemia A1c 5.4 from 05/28/2024 PTA meds-metformin glipizide.  Currently on hold Because of hypoglycemia, patient was placed on D10 drip at 100 mL/h.  With rising blood sugar level, it was subsequently stopped. Blood sugar level currently in range.  Continue SSI Accu-Cheks CBG (last 3)  Recent Labs    06/04/24 1700 06/04/24 2108 06/05/24 0615  GLUCAP 149* 144* 89     Essential hypertension on norvasc  and coreg  blood pressures were soft initially now it is starting to trend up will follow. PTA meds- Coreg , Cardizem , Lasix , losartan  IV hydralazine as needed   HLD PTA meds- aspirin , statin Aspirin  on hold.  Continue Crestor    Mild chronic anemia GERD Hemoglobin baseline between 9 and 10.  Noted a gradual decline.  Likely dilutional from hydration.  Continue to monitor.   Gout Continue allopurinol  and colchicine    Hyponatremia resolved  Goals of care appreciate palliative care patient now DNR comfort care Awaiting SNF placement and hospice follow-up at snf  Estimated body mass index is 25.99 kg/m as calculated from the following:   Height as of this encounter: 5' 5.98 (1.676 m).   Weight as of this encounter: 73 kg.  DVT prophylaxis: heparin  Code Status:full Family Communication: son Disposition Plan:  Status is: Inpatient Remains inpatient appropriate because: acute illness   Consultants: id nephro ortho  Subjective:  Resting in bed No events overnight  Objective: Vitals:   06/05/24 0809 06/05/24 0811 06/06/24 0611 06/06/24 0700  BP: (!) 169/67 (!) 169/67 (!) 178/69 (!) 187/59  Pulse: 65 65 66 63  Resp: 17  18 18   Temp: 97.9 F (36.6 C)  97.8 F (36.6 C) (!) 97.5 F (36.4 C)  TempSrc: Oral  Oral Oral  SpO2: 96%  93% 95%  Weight:      Height:        Intake/Output Summary (Last 24 hours) at 06/06/2024 1450 Last data filed at 06/06/2024 0927 Gross per 24 hour  Intake 60 ml  Output 0 ml  Net 60 ml     Filed Weights   05/26/24 2126 05/28/24 0903 05/29/24 0545  Weight: 70.8 kg 70.8 kg 73 kg    Examination:  General exam: Appears chronically ill appearing Respiratory system: Clear to auscultation. Respiratory effort normal. Cardiovascular system: reg Gastrointestinal system: Abdomen is nondistended, soft and nontender. No organomegaly or masses felt. Normal bowel sounds heard. Central nervous system: Alert and oriented. No focal neurological deficits. Extremities: left hand covered with dressing  Data Reviewed: I have personally reviewed following labs and imaging studies  CBC: Recent Labs  Lab 05/31/24 0340 06/02/24 0354 06/03/24 1540 06/04/24 0340  WBC 6.5 6.3 7.1 8.9  NEUTROABS 3.8  --   --   --   HGB 8.2* 8.1* 8.5* 7.9*  HCT 25.3* 24.7* 26.4* 24.4*  MCV 91.0 90.1 92.3 90.4  PLT 147* 151 165 163   Basic Metabolic Panel: Recent Labs  Lab 05/31/24 0340 06/01/24 1348 06/02/24 0354 06/03/24 1540 06/04/24 0340  NA 137 138 140 143 143  K 4.2 4.3 3.8 4.0 3.8  CL 105 109 110 114* 116*  CO2 19* 19* 18* 19* 20*  GLUCOSE 84 114* 92 139* 103*  BUN 63* 63* 57* 37* 31*  CREATININE 4.05* 3.27* 2.82* 1.99* 1.80*  CALCIUM  8.4*  8.7* 8.8* 8.9 8.7*   GFR: Estimated Creatinine Clearance: 24.2 mL/min (A) (by C-G formula based on SCr of 1.8 mg/dL (H)). Liver Function Tests: Recent Labs  Lab 06/03/24 1540 06/04/24 0340  AST 37 32  ALT 29 28  ALKPHOS 65 62  BILITOT 0.7 0.7  PROT 5.6* 5.4*  ALBUMIN 3.0* 2.8*    No results for input(s): LIPASE, AMYLASE in the last 168 hours. No results for input(s): AMMONIA in the last 168 hours. Coagulation Profile: No results for input(s): INR, PROTIME in the last 168 hours.  Cardiac Enzymes: Recent Labs  Lab 06/04/24 0340  CKTOTAL 65   BNP (last 3 results) No results for input(s): PROBNP in the last 8760 hours. HbA1C: No results for input(s): HGBA1C in the last 72 hours. CBG: Recent Labs  Lab  06/04/24 0608 06/04/24 1219 06/04/24 1700 06/04/24 2108 06/05/24 0615  GLUCAP 94 120* 149* 144* 89   Lipid Profile: No results for input(s): CHOL, HDL, LDLCALC, TRIG, CHOLHDL, LDLDIRECT in the last 72 hours. Thyroid  Function Tests: No results for input(s): TSH, T4TOTAL, FREET4, T3FREE, THYROIDAB in the last 72 hours. Anemia Panel: No results for input(s): VITAMINB12, FOLATE, FERRITIN, TIBC, IRON, RETICCTPCT in the last 72 hours. Sepsis Labs: No results for input(s): PROCALCITON, LATICACIDVEN in the last 168 hours.   Recent Results (from the past 240 hours)  MRSA Next Gen by PCR, Nasal     Status: None   Collection Time: 05/28/24  8:01 AM   Specimen: Nasal Mucosa; Nasal Swab  Result Value Ref Range Status   MRSA by PCR Next Gen NOT DETECTED NOT DETECTED Final    Comment: (NOTE) The GeneXpert MRSA Assay (FDA approved for NASAL specimens only), is one component of a comprehensive MRSA colonization surveillance program. It is not intended to diagnose MRSA infection nor to guide or monitor treatment for MRSA infections. Test performance is not FDA approved in patients less than 84 years old. Performed at [facility], [address] [place], [name] [address]   Aerobic/Anaerobic Culture w Gram Stain (surgical/deep wound)     Status: None   Collection Time: 05/28/24 11:01 AM   Specimen: Wound  Result Value Ref Range Status   Specimen Description WOUND  Final   Special Requests LEFT INDEX FINGER A  Final   Gram Stain   Final    FEW WBC PRESENT, PREDOMINANTLY PMN MODERATE GRAM POSITIVE COCCI    Culture   Final    ABUNDANT STAPHYLOCOCCUS AUREUS NO ANAEROBES ISOLATED Performed at [facility], [address] [place], [name] [address]    Report Status 06/02/2024 FINAL  Final   Organism ID, Bacteria STAPHYLOCOCCUS AUREUS  Final      Susceptibility   Staphylococcus aureus - MIC*    CIPROFLOXACIN <=0.5 SENSITIVE Sensitive      ERYTHROMYCIN >=8 RESISTANT Resistant     GENTAMICIN <=0.5 SENSITIVE Sensitive     OXACILLIN 0.5 SENSITIVE Sensitive     TETRACYCLINE <=1 SENSITIVE Sensitive     VANCOMYCIN 1 SENSITIVE Sensitive     TRIMETH/SULFA <=10 SENSITIVE Sensitive     CLINDAMYCIN RESISTANT Resistant     RIFAMPIN <=0.5 SENSITIVE Sensitive     Inducible Clindamycin POSITIVE Resistant     LINEZOLID 2 SENSITIVE Sensitive     * ABUNDANT STAPHYLOCOCCUS AUREUS  Aerobic/Anaerobic Culture w Gram Stain (surgical/deep wound)     Status: None   Collection Time: 05/28/24 11:26 AM   Specimen: Wound  Result Value Ref Range Status  Specimen Description WOUND  Final   Special Requests LEFT INDEX FINGER B  Final   Gram Stain RARE WBC SEEN RARE GRAM POSITIVE COCCI   Final   Culture   Final    RARE STAPHYLOCOCCUS AUREUS NO ANAEROBES ISOLATED Performed at [facility], [address] [place], [name] [address]    Report Status 06/02/2024 FINAL  Final   Organism ID, Bacteria STAPHYLOCOCCUS AUREUS  Final      Susceptibility   Staphylococcus aureus - MIC*    CIPROFLOXACIN <=0.5 SENSITIVE Sensitive     ERYTHROMYCIN >=8 RESISTANT Resistant     GENTAMICIN <=0.5 SENSITIVE Sensitive     OXACILLIN 0.5 SENSITIVE Sensitive     TETRACYCLINE <=1 SENSITIVE Sensitive     VANCOMYCIN 1 SENSITIVE Sensitive     TRIMETH/SULFA <=10 SENSITIVE Sensitive     CLINDAMYCIN RESISTANT Resistant     RIFAMPIN <=0.5 SENSITIVE Sensitive     Inducible Clindamycin POSITIVE Resistant     LINEZOLID 2 SENSITIVE Sensitive     * RARE STAPHYLOCOCCUS AUREUS  Aerobic/Anaerobic Culture w Gram Stain (surgical/deep wound)     Status: None   Collection Time: 05/28/24 11:45 AM   Specimen: Finger, Left; Tissue  Result Value Ref Range Status   Specimen Description TISSUE  Final   Special Requests LEFT FINGER C  Final   Gram Stain NO WBC SEEN RARE GRAM POSITIVE COCCI   Final   Culture   Final    FEW STAPHYLOCOCCUS AUREUS SUSCEPTIBILITIES PERFORMED ON  PREVIOUS CULTURE WITHIN THE LAST 5 DAYS. NO ANAEROBES ISOLATED Performed at [facility], [address] [place], [name] [address]    Report Status 06/02/2024 FINAL  Final     Radiology Studies: No results found.   Scheduled Meds:  amLODipine   2.5 mg Oral Daily   carvedilol   12.5 mg Oral BID WC   colchicine   0.3 mg Oral Daily   feeding supplement  237 mL Oral BID BM   Continuous Infusions:  DAPTOmycin       LOS: 10 days   Almarie KANDICE Hoots, MD  06/06/2024, 2:50 PM

## 2024-06-06 NOTE — TOC Progression Note (Signed)
 Transition of Care [facility]) - Progression Note    Patient Details  Name: Yvonne Newton MRN: [id number] Date of Birth: Oct 08, 1940  [facility] CM/SW Contact  [name], [name] Phone Number: 06/06/2024, 3:51 PM  Clinical Narrative:     Insurance requesting updated PT note- medical team updated - CSW will send updated PT note once completed   Montie Louder, MSW, LCSW Clinical Social Worker    Expected Discharge Plan: Skilled Nursing Facility Barriers to Discharge: Insurance Authorization               Expected Discharge Plan and Services In-house Referral: Clinical Social Work                                             Social Drivers of Health (SDOH) Interventions SDOH Screenings   Food Insecurity: Patient Declined (05/27/2024)  Housing: Unknown (05/27/2024)  Transportation Needs: Patient Declined (05/27/2024)  Utilities: Patient Declined (05/27/2024)  Financial Resource Strain: Low Risk  (05/03/2024)   Received from [facility]  Social Connections: Patient Declined (05/27/2024)  Tobacco Use: Medium Risk (05/28/2024)    Readmission Risk Interventions     No data to display

## 2024-06-06 NOTE — Progress Notes (Signed)
 Palliative Medicine Inpatient Follow Up Note HPI: 83 y.o. female with PMH significant for dementia DM2, HTN, HLD, CHF, stroke, vitamin B12 deficiency, osteoarthritis 9/25, presented to ED from home after a fall. Per report, patient's dementia has been progressively worsening.  For the last 2 days at home, patient has not been eating or drinking, was more somnolent and also having hallucinations.  On 9/25, patient stood up and fell and is brought to ED. The PMT has been asked to support additional goals of care conversations.   Today's Discussion 06/06/2024  *Please note that this is a verbal dictation therefore any spelling or grammatical errors are due to the Dragon Medical One system interpretation.  Chart reviewed inclusive of vital signs, progress notes, laboratory results, and diagnostic images.   I met with Yvonne Newton this morning. She is awake and oriented for me to person and Newton. Her nursing student is present at bedside. Per patients nursing student she has to use the bathroom. I was able to work with Yvonne Newton who got up to the bedside commode to urinate. After this was completed we walked to the chair. She was SBA with FWW.   Created space and opportunity for patient to explore thoughts feelings and fears regarding current medical situation. We reviewed the idea of her going to rehabilitation to gain strength which she was in favor of.   I called and spoke to patients son, Yvonne Newton we discussed her current clinical state and how she appears to have started eating more. We reviewed the plan for her to transition to rehabilitation. He asked about long term placement. We reviewed that this is a process which can be continued once Yvonne Newton transfers to the facility with their MSW team.   Yvonne Newton confirms the goals of comfort and no unnecessary interventions, needles sticks, diagnostic tests, or medications.   Confirmed the plan for OP Palliative support to follow along.   Questions and concerns  addressed/Palliative Support Provided.   Objective Assessment: Vital Signs Vitals:   06/06/24 0611 06/06/24 0700  BP: (!) 178/69 (!) 187/59  Pulse: 66 63  Resp: 18 18  Temp: 97.8 F (36.6 C) (!) 97.5 F (36.4 C)  SpO2: 93% 95%    Intake/Output Summary (Last 24 hours) at 06/06/2024 1226 Last data filed at 06/06/2024 9072 Gross per 24 hour  Intake 60 ml  Output 0 ml  Net 60 ml   Last Weight  Most recent update: 05/29/2024  5:45 AM    Weight  73 kg (160 lb 15 oz)            Gen:  Elderly Caucasian F chronically ill appearing HEENT: Dry mucous membranes CV: Regular rate and rhythm  PULM:  On RA, breathing is even and nonlabored ABD: soft/nontender  EXT: (+) LE pedal edema Neuro: Alert and oriented x2 - forgetful  SUMMARY OF RECOMMENDATIONS   DNAR/DNI   Goals are for dignity, quality, and comfort  Minimize needle sticks and interventions   Antihypertensives restarted   Continue IV antibiotics  Encourage oral intake  Plan for transition to SNF - Yvonne Newton  OP Palliative support will be offered through [facility]   Ongoing palliative medicine team support ______________________________________________________________________________________ Yvonne Newton Palliative Medicine Team Team Cell Phone: [phone] Please utilize secure chat with additional questions, if there is no response within 30 minutes please call the above phone number  Time Spent: 40  Palliative Medicine Team providers are available by phone from 7am to 7pm daily and can be  reached through the team cell phone.  Should this patient require assistance outside of these hours, please call the patient's attending physician.

## 2024-06-06 NOTE — TOC Progression Note (Addendum)
 Transition of Care [facility]) - Progression Note    Patient Details  Name: Yvonne Newton MRN: [id number] Date of Birth: 1941/04/12  Transition of Care [facility]) CM/SW Contact  [name], [name] Phone Number: 06/06/2024, 11:46 AM  Clinical Narrative:     Called patient's insurance HTA/Tammy - requested to start authorization for [facility].   [name] has been updated.  TOC continue to follow and assist with discharge summary.   Montie Louder, MSW, LCSW Clinical Social Worker    Expected Discharge Plan: Skilled Nursing Facility Barriers to Discharge: Insurance Authorization               Expected Discharge Plan and Services In-house Referral: Clinical Social Work                                             Social Drivers of Health (SDOH) Interventions SDOH Screenings   Food Insecurity: Patient Declined (05/27/2024)  Housing: Unknown (05/27/2024)  Transportation Needs: Patient Declined (05/27/2024)  Utilities: Patient Declined (05/27/2024)  Financial Resource Strain: Low Risk  (05/03/2024)   Received from [facility]  Social Connections: Patient Declined (05/27/2024)  Tobacco Use: Medium Risk (05/28/2024)    Readmission Risk Interventions     No data to display

## 2024-06-07 DIAGNOSIS — Z515 Encounter for palliative care: Secondary | ICD-10-CM | POA: Diagnosis not present

## 2024-06-07 DIAGNOSIS — N189 Chronic kidney disease, unspecified: Secondary | ICD-10-CM | POA: Diagnosis not present

## 2024-06-07 DIAGNOSIS — N179 Acute kidney failure, unspecified: Secondary | ICD-10-CM | POA: Diagnosis not present

## 2024-06-07 DIAGNOSIS — Z7189 Other specified counseling: Secondary | ICD-10-CM | POA: Diagnosis not present

## 2024-06-07 NOTE — TOC Progression Note (Addendum)
 Transition of Care [facility]) - Progression Note    Patient Details  Name: Yvonne Newton MRN: [id number] Date of Birth: 08/26/1941  Transition [facility] Care [facility]) CM/SW Contact  Lauraine FORBES Saa, LCSWA Phone Number: 06/07/2024, 12:08 PM  Clinical Narrative:     12:08 PM CSW informed HTA of updated PT note. SNF insurance authorization remains pending. CSW will continue to follow.  1:55 PM HTA informed CSW that patient's SNF authorization requested was sent to HTA MD for review. CSW will continue to follow.   Expected Discharge Plan: Skilled Nursing Facility Barriers to Discharge: English as a second language teacher, Continued Medical Work up               Expected Discharge Plan and Services In-house Referral: Clinical Social Work   Post Acute Care Choice: Skilled Nursing Facility Living arrangements for the past 2 months: Single Family Home                                       Social Drivers of Health (SDOH) Interventions SDOH Screenings   Food Insecurity: Patient Declined (05/27/2024)  Housing: Unknown (05/27/2024)  Transportation Needs: Patient Declined (05/27/2024)  Utilities: Patient Declined (05/27/2024)  Financial Resource Strain: Low Risk  (05/03/2024)   Received from [facility]  Social Connections: Patient Declined (05/27/2024)  Tobacco Use: Medium Risk (05/28/2024)    Readmission Risk Interventions     No data to display

## 2024-06-07 NOTE — Progress Notes (Signed)
   Palliative Medicine Inpatient Follow Up Note HPI: 83 y.o. female with PMH significant for dementia DM2, HTN, HLD, CHF, stroke, vitamin B12 deficiency, osteoarthritis 9/25, presented to ED from home after a fall. Per report, patient's dementia has been progressively worsening.  For the last 2 days at home, patient has not been eating or drinking, was more somnolent and also having hallucinations.  On 9/25, patient stood up and fell and is brought to ED. The PMT has been asked to support additional goals of care conversations.   Today's Discussion 06/07/2024  *Please note that this is a verbal dictation therefore any spelling or grammatical errors are due to the Dragon Medical One system interpretation.  Chart reviewed inclusive of vital signs, progress notes, laboratory results, and diagnostic images. POs do appear somewhat improved if someone is available to support Lucyann with eating and drinking.  I met with Aneesha this morning in the company of her nursing technician. She complains of feeling more tired this morning. She shares the knowledge that she is weakened. We reviewed the plan for her to transition to rehabilitation to optimized her mobility and allow the time the has left to be both function as well as comfortable.  I called patients son, Koren to provide an update this morning.   Awaiting authorization for [facility] with OP Palliative support.   Questions and concerns addressed/Palliative Support Provided.   Objective Assessment: Vital Signs Vitals:   06/06/24 1952 06/07/24 0835  BP: (!) 159/53 (!) 146/117  Pulse: 65 64  Resp: 20   Temp: 97.9 F (36.6 C)   SpO2: 95%     Intake/Output Summary (Last 24 hours) at 06/07/2024 9095 Last data filed at 06/06/2024 9072 Gross per 24 hour  Intake 10 ml  Output 0 ml  Net 10 ml   Last Weight  Most recent update: 05/29/2024  5:45 AM    Weight  73 kg (160 lb 15 oz)            Gen:  Elderly Caucasian F chronically ill  appearing HEENT: Dry mucous membranes CV: Regular rate and rhythm  PULM:  On RA, breathing is even and nonlabored ABD: soft/nontender  EXT: (+) LE pedal edema Neuro: Alert and oriented x2 - forgetful  SUMMARY OF RECOMMENDATIONS   DNAR/DNI   Goals are for dignity, quality, and comfort  Minimize needle sticks and interventions   Continue IV antibiotics  Encourage oral intake  Plan for transition to SNF - [name]  OP Palliative support will be offered through [employer]   Ongoing palliative medicine team support ______________________________________________________________________________________ [name] Floris Palliative Medicine Team Team Cell Phone: [phone] Please utilize secure chat with additional questions, if there is no response within 30 minutes please call the above phone number  Time Spent: 25  Palliative Medicine Team providers are available by phone from 7am to 7pm daily and can be reached through the team cell phone.  Should this patient require assistance outside of these hours, please call the patient's attending physician.

## 2024-06-07 NOTE — TOC Progression Note (Deleted)
 Transition of Care [facility]) - Progression Note    Patient Details  Name: Yvonne Newton MRN: [id number] Date of Birth: 02/07/1941  Transition of Care [facility]) CM/SW Contact  Lauraine FORBES Saa, LCSWA Phone Number: 06/07/2024, 1:54 PM  Clinical Narrative:     12:09 PM CSW informed HTA of updated PT note. SNF insurance authorization remains pending. CSW will continue to follow.  1:54 PM HTA informed CSW that patient's SNF authorization requested was sent to HTA MD for review. CSW will continue to follow.  Expected Discharge Plan: Skilled Nursing Facility Barriers to Discharge: English as a second language teacher, Continued Medical Work up               Expected Discharge Plan and Services In-house Referral: Clinical Social Work   Post Acute Care Choice: Skilled Nursing Facility Living arrangements for the past 2 months: Single Family Home                                       Social Drivers of Health (SDOH) Interventions SDOH Screenings   Food Insecurity: Patient Declined (05/27/2024)  Housing: Unknown (05/27/2024)  Transportation Needs: Patient Declined (05/27/2024)  Utilities: Patient Declined (05/27/2024)  Financial Resource Strain: Low Risk  (05/03/2024)   Received from [facility]  Social Connections: Patient Declined (05/27/2024)  Tobacco Use: Medium Risk (05/28/2024)    Readmission Risk Interventions     No data to display

## 2024-06-07 NOTE — Progress Notes (Signed)
 Physical Therapy Treatment Patient Details Name: Yvonne Newton MRN: [id number] DOB: 1940/11/13 Today's Date: 06/07/2024   History of Present Illness Yvonne Newton is a 83 y.o. female presented to [facility] ED 05/26/24 after fall at home. CXR suggestive right lower lobe lung infiltration with some bronchiectasis. X-rays of the left hand noted communicated intra-articular fracture at the base of the second distal phalanx and associated soft tissue swelling with possible open wound. 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 PMHx: CHF, T2DM CVA, essential HTN, HLD, OA, dementia, and gouty tophi of bilateral fingers.    PT Comments  Pt is progressing well towards goals. Currently pt is Min A for sit to stand and gait with RW with light resting of the LUE on the RW to prevent WB and maintain precautions. Son present during session. Pt at a high risk for falls and fatigues easily requiring 2x rest breaks during 60 ft of gait. Due to pt current functional status, home set up and available assistance at home recommending skilled physical therapy services < 3 hours/day in order to address strength, balance and functional mobility to decrease risk for falls, injury, immobility, skin break down and re-hospitalization.     If plan is discharge home, recommend the following: Assistance with cooking/housework;Help with stairs or ramp for entrance;Supervision due to cognitive status;Assist for transportation;Two people to help with walking and/or transfers   Can travel by private vehicle     No  Equipment Recommendations  Hospital bed;Wheelchair (measurements PT);Hoyer lift;Wheelchair cushion (measurements PT)       Precautions / Restrictions Precautions Precautions: Fall Recall of Precautions/Restrictions: Impaired Precaution/Restrictions Comments: intermittent awareness of WB precautions Restrictions Weight Bearing Restrictions  Per Provider Order: Yes LUE Weight Bearing Per Provider Order: Non weight bearing     Mobility  Bed Mobility   General bed mobility comments: up in recliner at beginning of session and in transport chair with nursing at end of session    Transfers Overall transfer level: Needs assistance Equipment used: Rolling walker (2 wheels) Transfers: Sit to/from Stand, Bed to chair/wheelchair/BSC Sit to Stand: Min assist   Step pivot transfers: Min assist       General transfer comment: Min A with R hand pushing up and light resting of LUE on RW.    Ambulation/Gait Ambulation/Gait assistance: Min assist Gait Distance (Feet): 60 Feet Assistive device: Rolling walker (2 wheels) Gait Pattern/deviations: Step-through pattern, Decreased step length - right, Decreased step length - left, Trunk flexed Gait velocity: decreased Gait velocity interpretation: <1.31 ft/sec, indicative of household ambulator   General Gait Details: Pt took short slow steps, She was unsteady requiring assist to stabilize. Cues for sequencing.Light resting LUE on walker, assist navigating walker. Standing rest break 2x during ambulation      Balance Overall balance assessment: Needs assistance Sitting-balance support: Feet supported Sitting balance-Leahy Scale: Fair Sitting balance - Comments: posterior LOB Postural control: Posterior lean Standing balance support: During functional activity, Single extremity supported, Bilateral upper extremity supported, Reliant on assistive device for balance Standing balance-Leahy Scale: Poor Standing balance comment: Pt dependent on external support of therapist.        Communication Communication Communication: No apparent difficulties  Cognition Arousal: Alert Behavior During Therapy: WFL for tasks assessed/performed, Flat affect   PT - Cognitive impairments: History of cognitive impairments     PT - Cognition Comments: fatigued throughout session Following  commands: Impaired Following commands impaired: Follows one  step commands inconsistently, Follows one step commands with increased time    Cueing Cueing Techniques: Verbal cues, Gestural cues     General Comments General comments (skin integrity, edema, etc.): Son present during session      Pertinent Vitals/Pain Pain Assessment Pain Score: 3  Pain Location: L hand Pain Descriptors / Indicators: Discomfort Pain Intervention(s): Monitored during session     PT Goals (current goals can now be found in the care plan section) Acute Rehab PT Goals Patient Stated Goal: Family report to maximize safety and mobility. PT Goal Formulation: With family Time For Goal Achievement: 06/12/24 Potential to Achieve Goals: Fair Progress towards PT goals: Progressing toward goals    Frequency    Min 2X/week      PT Plan  Continue with current POC        AM-PAC PT 6 Clicks Mobility   Outcome Measure  Help needed turning from your back to your side while in a flat bed without using bedrails?: A Little Help needed moving from lying on your back to sitting on the side of a flat bed without using bedrails?: A Little Help needed moving to and from a bed to a chair (including a wheelchair)?: A Little Help needed standing up from a chair using your arms (e.g., wheelchair or bedside chair)?: A Little Help needed to walk in hospital room?: A Little Help needed climbing 3-5 steps with a railing? : Total 6 Click Score: 16    End of Session Equipment Utilized During Treatment: Gait belt Activity Tolerance: Patient tolerated treatment well;Patient limited by fatigue Patient left: in bed;with family/visitor present;with nursing/sitter in room Nurse Communication: Mobility status PT Visit Diagnosis: Other abnormalities of gait and mobility (R26.89);Unsteadiness on feet (R26.81)     Time: 8944-8891 PT Time Calculation (min) (ACUTE ONLY): 13 min  Charges:    $Therapeutic Activity: 8-22  mins PT General Charges $$ ACUTE PT VISIT: 1 Visit                     Dorothyann Maier, DPT, CLT  Acute Rehabilitation Services Office: [phone] (Secure chat preferred)    Dorothyann VEAR Maier 06/07/2024, 11:46 AM

## 2024-06-07 NOTE — Progress Notes (Signed)
 [facility] PROGRESS NOTE  Yvonne Newton (DOB: 29-May-1941) FMW:[id number] PCP: [facility]  Brief Narrative: Yvonne Newton is an 83 y.o. female with a history of dementia DM2, HTN, HLD, CHF, stroke, vitamin B12 deficiency, osteoarthritis 9/25, presented to ED from home after a fall. Per report, patient's dementia has been progressively worsening.  For the last 2 days at home, patient has not been eating or drinking, was more somnolent and also having hallucinations.  On 9/25, patient stood up and fell and is brought to ED.    In the ED, hemodynamically stable, blood glucose level was 89 Initially seen in the ED as code stroke CT head and cervical spine which did not note any acute intracranial or bony cervical abnormality.  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 Labs significant for potassium of 5.6, creatinine of 6.36, lactic acid 2.8 Chest x-ray showed cardiac enlargement with infiltration in right lower lobe lung with some bronchiectasis.  X-rays of the left hand showed communicated intra-articular fracture at the base of the second distal phalanx and associated soft tissue swelling with possible open wound.   Urinalysis noted small amount of hemoglobin, trace leukocytes, few bacteria, and 11-20 squamous epithelial cells/hpf.   Patient was given IV fluid Admitted to [facility] 9/27, underwent I&D of the left index finger.   Subjective: Pt feels tired, pain is controlled. No fevers.   Objective: BP (!) 146/117   Pulse 64   Temp 97.9 F (36.6 C) (Oral)   Resp 20   Ht 5' 5.98 (1.676 m)   Wt 73 kg   SpO2 95%   BMI 25.99 kg/m   Gen: No distress Pulm: Clear, nonlabored  CV: RRR, no MRG GI: Soft, NT, ND, +BS  Neuro: Alert and incompletely oriented but conversant/interactive. No new focal deficits. Ext: Warm, dry, gouty tophi in hands.  Skin:  Left hand/wrist dressing c/d/i   Assessment & Plan: Principal Problem:   Acute kidney injury superimposed on chronic kidney disease Active Problems:   Hyperkalemia   Controlled type 2 diabetes mellitus with hypoglycemia, without long-term current use of insulin  (HCC)   Infected finger   Dementia without behavioral disturbance (HCC)   Acute metabolic encephalopathy   Fall at home, initial encounter   Metabolic acidosis   Lactic acidosis   Essential hypertension   Normocytic anemia   Hyperlipidemia, unspecified   Gout   GERD (gastroesophageal reflux disease)   Suppurative tenosynovitis of flexor tendon of left hand   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 Cr back to baseline ~1.8 as of 10/4. Presented with creatinine elevated to 6.36, lactic acid at 2.8 and bicarb low at 16 - Pt/family do not wish for continued lab monitoring.   Pyogenic flexor tenosynovitis due to MSSA:  - S/p I&D, partial amputation of tip of finger 9/27 by Dr. Delene - Currently on IV daptomycin per ID. At discharge, will convert to doxycycline thru Jul 09, 2024.  - F/u at [facility] w/Dr. Overton 10/17 9:45 am   Goals of care counseling/discussion:  - Palliative care involvement appreciated. Goals of care are largely toward comfort. She is DNR and will continue to have palliative care involvement at rehabilitation facility. However, she is not actively dying and we are continuing to treat the treatable with antibiotics.   Acute metabolic encephalopathy Underlying dementia At baseline, disoriented.  In the hospital, she is having intermittent hallucinations, agitation.  Likely hospital induced delirium Brain imaging without acute stroke PTA meds- risperidone, Zoloft  Currently continued on Zoloft .  Not sure if she is compliant to risperidone. Continue delirium precautions   Fall/syncope and collapse Patient was reported to have fallen/passed out after standing up  from bed.   Suspect likely related to orthostatic hypotension given soft blood pressures.   Type 2 diabetes mellitus Hyperglycemia A1c 5.4 from 05/28/2024 PTA meds-metformin glipizide.  Currently on hold Because of hypoglycemia, patient was placed on D10 drip at 100 mL/h.  With rising blood sugar level, it was subsequently stopped. Blood sugar level currently in range.  Continue SSI Accu-Cheks CBG (last 3)  Recent Labs (last 2 labs)       Recent Labs    06/04/24 1700 06/04/24 2108 06/05/24 0615  GLUCAP 149* 144* 89       Essential hypertension on norvasc  and coreg  blood pressures were soft initially now it is starting to trend up will follow. PTA meds- Coreg , Cardizem , Lasix , losartan  IV hydralazine as needed   HLD PTA meds- aspirin , statin Aspirin  on hold.  Continue Crestor    Mild chronic anemia GERD Hemoglobin baseline between 9 and 10.  Noted a gradual decline.  Likely dilutional from hydration.  Continue to monitor.    Gout Continue allopurinol  and colchicine    Hyponatremia resolved  Yvonne KATHEE Come, MD [facility] [email or web address] 06/07/2024, 3:44 PM

## 2024-06-08 DIAGNOSIS — R2681 Unsteadiness on feet: Secondary | ICD-10-CM | POA: Diagnosis not present

## 2024-06-08 DIAGNOSIS — R1114 Bilious vomiting: Secondary | ICD-10-CM | POA: Diagnosis not present

## 2024-06-08 DIAGNOSIS — D72829 Elevated white blood cell count, unspecified: Secondary | ICD-10-CM | POA: Diagnosis present

## 2024-06-08 DIAGNOSIS — I509 Heart failure, unspecified: Secondary | ICD-10-CM | POA: Diagnosis not present

## 2024-06-08 DIAGNOSIS — Z711 Person with feared health complaint in whom no diagnosis is made: Secondary | ICD-10-CM | POA: Diagnosis not present

## 2024-06-08 DIAGNOSIS — Z79899 Other long term (current) drug therapy: Secondary | ICD-10-CM | POA: Diagnosis not present

## 2024-06-08 DIAGNOSIS — F0390 Unspecified dementia without behavioral disturbance: Secondary | ICD-10-CM | POA: Diagnosis not present

## 2024-06-08 DIAGNOSIS — Z7189 Other specified counseling: Secondary | ICD-10-CM | POA: Diagnosis not present

## 2024-06-08 DIAGNOSIS — M109 Gout, unspecified: Secondary | ICD-10-CM | POA: Diagnosis present

## 2024-06-08 DIAGNOSIS — R109 Unspecified abdominal pain: Secondary | ICD-10-CM | POA: Diagnosis not present

## 2024-06-08 DIAGNOSIS — F03918 Unspecified dementia, unspecified severity, with other behavioral disturbance: Secondary | ICD-10-CM | POA: Diagnosis present

## 2024-06-08 DIAGNOSIS — E1122 Type 2 diabetes mellitus with diabetic chronic kidney disease: Secondary | ICD-10-CM | POA: Diagnosis present

## 2024-06-08 DIAGNOSIS — N183 Chronic kidney disease, stage 3 unspecified: Secondary | ICD-10-CM | POA: Diagnosis not present

## 2024-06-08 DIAGNOSIS — N189 Chronic kidney disease, unspecified: Secondary | ICD-10-CM | POA: Diagnosis not present

## 2024-06-08 DIAGNOSIS — I130 Hypertensive heart and chronic kidney disease with heart failure and stage 1 through stage 4 chronic kidney disease, or unspecified chronic kidney disease: Secondary | ICD-10-CM | POA: Diagnosis present

## 2024-06-08 DIAGNOSIS — R4781 Slurred speech: Secondary | ICD-10-CM | POA: Diagnosis not present

## 2024-06-08 DIAGNOSIS — D649 Anemia, unspecified: Secondary | ICD-10-CM | POA: Diagnosis present

## 2024-06-08 DIAGNOSIS — G9341 Metabolic encephalopathy: Secondary | ICD-10-CM | POA: Diagnosis present

## 2024-06-08 DIAGNOSIS — F0392 Unspecified dementia, unspecified severity, with psychotic disturbance: Secondary | ICD-10-CM | POA: Diagnosis present

## 2024-06-08 DIAGNOSIS — N1832 Chronic kidney disease, stage 3b: Secondary | ICD-10-CM | POA: Diagnosis present

## 2024-06-08 DIAGNOSIS — L089 Local infection of the skin and subcutaneous tissue, unspecified: Secondary | ICD-10-CM | POA: Diagnosis not present

## 2024-06-08 DIAGNOSIS — Z8249 Family history of ischemic heart disease and other diseases of the circulatory system: Secondary | ICD-10-CM | POA: Diagnosis not present

## 2024-06-08 DIAGNOSIS — Z515 Encounter for palliative care: Secondary | ICD-10-CM | POA: Diagnosis not present

## 2024-06-08 DIAGNOSIS — R52 Pain, unspecified: Secondary | ICD-10-CM | POA: Diagnosis not present

## 2024-06-08 DIAGNOSIS — Z7401 Bed confinement status: Secondary | ICD-10-CM | POA: Diagnosis not present

## 2024-06-08 DIAGNOSIS — Z7982 Long term (current) use of aspirin: Secondary | ICD-10-CM | POA: Diagnosis not present

## 2024-06-08 DIAGNOSIS — K219 Gastro-esophageal reflux disease without esophagitis: Secondary | ICD-10-CM | POA: Diagnosis present

## 2024-06-08 DIAGNOSIS — M65142 Other infective (teno)synovitis, left hand: Secondary | ICD-10-CM | POA: Diagnosis not present

## 2024-06-08 DIAGNOSIS — I10 Essential (primary) hypertension: Secondary | ICD-10-CM | POA: Diagnosis not present

## 2024-06-08 DIAGNOSIS — I5032 Chronic diastolic (congestive) heart failure: Secondary | ICD-10-CM | POA: Diagnosis present

## 2024-06-08 DIAGNOSIS — I959 Hypotension, unspecified: Secondary | ICD-10-CM | POA: Diagnosis not present

## 2024-06-08 DIAGNOSIS — E162 Hypoglycemia, unspecified: Secondary | ICD-10-CM | POA: Diagnosis not present

## 2024-06-08 DIAGNOSIS — E11649 Type 2 diabetes mellitus with hypoglycemia without coma: Secondary | ICD-10-CM | POA: Diagnosis present

## 2024-06-08 DIAGNOSIS — Z66 Do not resuscitate: Secondary | ICD-10-CM | POA: Diagnosis present

## 2024-06-08 DIAGNOSIS — R630 Anorexia: Secondary | ICD-10-CM | POA: Diagnosis present

## 2024-06-08 DIAGNOSIS — M869 Osteomyelitis, unspecified: Secondary | ICD-10-CM | POA: Diagnosis not present

## 2024-06-08 DIAGNOSIS — S62609D Fracture of unspecified phalanx of unspecified finger, subsequent encounter for fracture with routine healing: Secondary | ICD-10-CM | POA: Diagnosis not present

## 2024-06-08 DIAGNOSIS — M6281 Muscle weakness (generalized): Secondary | ICD-10-CM | POA: Diagnosis not present

## 2024-06-08 DIAGNOSIS — F0280 Dementia in other diseases classified elsewhere without behavioral disturbance: Secondary | ICD-10-CM | POA: Diagnosis not present

## 2024-06-08 DIAGNOSIS — Z7984 Long term (current) use of oral hypoglycemic drugs: Secondary | ICD-10-CM | POA: Diagnosis not present

## 2024-06-08 DIAGNOSIS — N179 Acute kidney failure, unspecified: Secondary | ICD-10-CM | POA: Diagnosis not present

## 2024-06-08 DIAGNOSIS — Z9049 Acquired absence of other specified parts of digestive tract: Secondary | ICD-10-CM | POA: Diagnosis not present

## 2024-06-08 DIAGNOSIS — R451 Restlessness and agitation: Secondary | ICD-10-CM | POA: Diagnosis not present

## 2024-06-08 DIAGNOSIS — F0393 Unspecified dementia, unspecified severity, with mood disturbance: Secondary | ICD-10-CM | POA: Diagnosis present

## 2024-06-08 DIAGNOSIS — R627 Adult failure to thrive: Secondary | ICD-10-CM | POA: Diagnosis present

## 2024-06-08 DIAGNOSIS — E669 Obesity, unspecified: Secondary | ICD-10-CM | POA: Diagnosis present

## 2024-06-08 DIAGNOSIS — E876 Hypokalemia: Secondary | ICD-10-CM | POA: Diagnosis present

## 2024-06-08 DIAGNOSIS — R638 Other symptoms and signs concerning food and fluid intake: Secondary | ICD-10-CM | POA: Diagnosis not present

## 2024-06-08 DIAGNOSIS — E785 Hyperlipidemia, unspecified: Secondary | ICD-10-CM | POA: Diagnosis present

## 2024-06-08 MED ORDER — DOXYCYCLINE HYCLATE 100 MG PO TABS: 100.0000 mg | ORAL_TABLET | Freq: Two times a day (BID) | ORAL | Status: DC

## 2024-06-08 MED ORDER — COLCHICINE 0.6 MG PO TABS: 0.3000 mg | ORAL_TABLET | Freq: Every day | ORAL | Status: DC

## 2024-06-08 MED ORDER — ENSURE PLUS HIGH PROTEIN PO LIQD: 237.0000 mL | Freq: Two times a day (BID) | ORAL | Status: DC

## 2024-06-08 MED ORDER — AMLODIPINE BESYLATE 5 MG PO TABS: 5.0000 mg | ORAL_TABLET | Freq: Every day | ORAL | Status: DC

## 2024-06-08 MED ORDER — DOXYCYCLINE HYCLATE 100 MG PO TABS
100.0000 mg | ORAL_TABLET | Freq: Two times a day (BID) | ORAL | Status: DC
  Administered 2024-06-08: 100 mg via ORAL
  Filled 2024-06-08: qty 1

## 2024-06-08 NOTE — Care Management Important Message (Signed)
 Important Message  Patient Details  Name: Yvonne Newton MRN: [id number] Date of Birth: [birth year]   Important Message Given:        Claretta Deed 06/08/2024, 4:09 PM

## 2024-06-08 NOTE — Plan of Care (Signed)
 Problem: Education: Goal: Knowledge of General Education information will improve Description: Including pain rating scale, medication(s)/side effects and non-pharmacologic comfort measures Outcome: Adequate for Discharge   Problem: Health Behavior/Discharge Planning: Goal: Ability to manage health-related needs will improve Outcome: Adequate for Discharge   Problem: Clinical Measurements: Goal: Ability to maintain clinical measurements within normal limits will improve Outcome: Adequate for Discharge Goal: Will remain free from infection Outcome: Adequate for Discharge Goal: Diagnostic test results will improve Outcome: Adequate for Discharge Goal: Respiratory complications will improve Outcome: Adequate for Discharge Goal: Cardiovascular complication will be avoided Outcome: Adequate for Discharge   Problem: Activity: Goal: Risk for activity intolerance will decrease Outcome: Adequate for Discharge   Problem: Nutrition: Goal: Adequate nutrition will be maintained Outcome: Adequate for Discharge   Problem: Coping: Goal: Level of anxiety will decrease Outcome: Adequate for Discharge   Problem: Elimination: Goal: Will not experience complications related to bowel motility Outcome: Adequate for Discharge Goal: Will not experience complications related to urinary retention Outcome: Adequate for Discharge   Problem: Pain Managment: Goal: General experience of comfort will improve and/or be controlled Outcome: Adequate for Discharge   Problem: Safety: Goal: Ability to remain free from injury will improve Outcome: Adequate for Discharge   Problem: Skin Integrity: Goal: Risk for impaired skin integrity will decrease Outcome: Adequate for Discharge   Problem: Education: Goal: Ability to describe self-care measures that may prevent or decrease complications (Diabetes Survival Skills Education) will improve Outcome: Adequate for Discharge Goal: Individualized Educational  Video(s) Outcome: Adequate for Discharge   Problem: Coping: Goal: Ability to adjust to condition or change in health will improve Outcome: Adequate for Discharge   Problem: Fluid Volume: Goal: Ability to maintain a balanced intake and output will improve Outcome: Adequate for Discharge   Problem: Health Behavior/Discharge Planning: Goal: Ability to identify and utilize available resources and services will improve Outcome: Adequate for Discharge Goal: Ability to manage health-related needs will improve Outcome: Adequate for Discharge   Problem: Metabolic: Goal: Ability to maintain appropriate glucose levels will improve Outcome: Adequate for Discharge   Problem: Nutritional: Goal: Maintenance of adequate nutrition will improve Outcome: Adequate for Discharge Goal: Progress toward achieving an optimal weight will improve Outcome: Adequate for Discharge   Problem: Skin Integrity: Goal: Risk for impaired skin integrity will decrease Outcome: Adequate for Discharge   Problem: Tissue Perfusion: Goal: Adequacy of tissue perfusion will improve Outcome: Adequate for Discharge   Problem: Acute Rehab OT Goals (only OT should resolve) Goal: Pt. Will Perform Upper Body Dressing Outcome: Adequate for Discharge Goal: Pt. Will Perform Lower Body Dressing Outcome: Adequate for Discharge Goal: Pt. Will Transfer To Toilet Outcome: Adequate for Discharge Goal: OT Additional ADL Goal #1 Outcome: Adequate for Discharge   Problem: Acute Rehab PT Goals(only PT should resolve) Goal: Pt Will Go Supine/Side To Sit Outcome: Adequate for Discharge Goal: Pt Will Go Sit To Supine/Side Outcome: Adequate for Discharge Goal: Patient Will Transfer Sit To/From Stand Outcome: Adequate for Discharge Goal: Pt Will Transfer Bed To Chair/Chair To Bed Outcome: Adequate for Discharge Goal: Pt Will Ambulate Outcome: Adequate for Discharge   Problem: Education: Goal: Knowledge of the prescribed  therapeutic regimen will improve Outcome: Adequate for Discharge   Problem: Coping: Goal: Ability to identify and develop effective coping behavior will improve Outcome: Adequate for Discharge   Problem: Clinical Measurements: Goal: Quality of life will improve Outcome: Adequate for Discharge   Problem: Respiratory: Goal: Verbalizations of increased ease of respirations will increase Outcome: Adequate for  Discharge   Problem: Role Relationship: Goal: Family's ability to cope with current situation will improve Outcome: Adequate for Discharge Goal: Ability to verbalize concerns, feelings, and thoughts to partner or family member will improve Outcome: Adequate for Discharge   Problem: Pain Management: Goal: Satisfaction with pain management regimen will improve Outcome: Adequate for Discharge

## 2024-06-08 NOTE — Care Management Important Message (Signed)
 Important Message  Patient Details  Name: Yvonne Newton MRN: [id number] Date of Birth: 09-05-1940   Important Message Given:  Yes - Medicare IM     Claretta Deed 06/08/2024, 4:10 PM

## 2024-06-08 NOTE — Discharge Summary (Signed)
 Physician Discharge Summary   Patient: Yvonne Newton MRN: [id number] DOB: 12-06-1940  Admit date:     05/26/2024  Discharge date: 06/08/24  Discharge Physician: Lebron JINNY Cage   PCP: [facility]   Recommendations at discharge:    Follow up with OP palliative/hospice team  Discharge Diagnoses: Principal Problem:   Acute kidney injury superimposed on chronic kidney disease Active Problems:   Hyperkalemia   Controlled type 2 diabetes mellitus with hypoglycemia, without long-term current use of insulin  (HCC)   Infected finger   Dementia without behavioral disturbance (HCC)   Acute metabolic encephalopathy   Fall at home, initial encounter   Metabolic acidosis   Lactic acidosis   Essential hypertension   Normocytic anemia   Hyperlipidemia, unspecified   Gout   GERD (gastroesophageal reflux disease)   Suppurative tenosynovitis of flexor tendon of left hand   Osteomyelitis of finger of left hand [facility])    Hospital Course: Yvonne Newton is an 83 y.o. female with a history of dementia DM2, HTN, HLD, CHF, stroke, vitamin B12 deficiency, osteoarthritis, presented to ED from home after a fall. Per report, patient's dementia has been progressively worsening.  For the last 2 days at home PTA, patient has not been eating or drinking, was more somnolent and also having hallucinations.  On 9/25, patient stood up and fell and was admitted. In the ED, hemodynamically stable, CT head and cervical spine unremarkable.  Did not note any acute intracranial or bony cervical abnormality. 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 Labs significant for potassium of 5.6, creatinine of 6.36, lactic acid 2.8. Chest x-ray showed cardiac enlargement with infiltration in right lower lobe lung with some bronchiectasis. X-rays of the left hand showed communicated intra-articular  fracture at the base of the second distal phalanx and associated soft tissue swelling with possible open wound.  Patient admitted for further management.  On 9/27, underwent I&D of the left index finger.  Due to overall poor prognosis, palliative care consulted.  Patient to discharge to SNF with outpatient palliative/hospice care.   Today, patient denied any new complaints.    Assessment and Plan: AKI on CKD 3B Resolved. Cr back to baseline ~1.8 as of 10/4 Presented with creatinine elevated to 6.36, lactic acid at 2.8 and bicarb low at 16 Pt/family do not wish for continued lab monitoring.   Pyogenic flexor tenosynovitis due to MSSA  S/p I&D, partial amputation of tip of finger 9/27 by Dr. Delene S/p IV daptomycin, recommend outpatient doxycycline thru Jul 09, 2024.  F/u at [facility] w/Dr. Overton 10/17 9:45 am  Goals of care counseling/discussion Palliative care involvement appreciated. Goals of care are largely toward comfort. She is DNR and will continue to have palliative care involvement at rehabilitation facility. However, she is not actively dying and we are continuing to treat the treatable with antibiotics.    Acute metabolic encephalopathy Underlying dementia, likely hospital induced delirium At baseline, disoriented.   Brain imaging without acute stroke Currently continued on Zoloft , recently prescribed risperidone Continue delirium precautions   Fall/syncope and collapse Patient was reported to have fallen/passed out after standing up from bed Suspect likely related to orthostatic hypotension given soft blood pressures   Type 2 diabetes mellitus A1c 5.4 from 05/28/2024 PTA meds-metformin glipizide    Essential hypertension Continue norvasc  and coreg  for now pending BP Continue to hold Cardizem , Lasix , losartan  pending BP  HLD Continue Crestor    Mild chronic anemia GERD    Gout Continue allopurinol  and colchicine      Procedures performed: Noted  above Disposition: Skilled nursing facility Diet recommendation: Modified carb     DISCHARGE MEDICATION: Allergies as of 06/08/2024       Reactions   Blueberry Flavoring Agent (non-screening) Hives, Shortness Of Breath   Influenza Vaccines    Penicillins Itching   Sulfa Antibiotics Itching   Sulfonamide Derivatives Itching   Other Swelling, Rash   METAL        Medication List     PAUSE taking these medications    diltiazem  120 MG 24 hr capsule Wait to take this until your doctor or other care provider tells you to start again. Commonly known as: CARDIZEM  CD TAKE 1 CAPSULE BY MOUTH EVERY DAY   furosemide  40 MG tablet Wait to take this until your doctor or other care provider tells you to start again. Commonly known as: LASIX  TAKE 2 TABLETS BY MOUTH EVERY DAY   losartan  25 MG tablet Wait to take this until your doctor or other care provider tells you to start again. Commonly known as: COZAAR  TAKE 1/2 TABLET BY MOUTH DAILY   potassium chloride  SA 20 MEQ tablet Wait to take this until your doctor or other care provider tells you to start again. Commonly known as: KLOR-CON  M TAKE 2 TABLETS BY MOUTH EVERY DAY What changed:  how much to take when to take this       STOP taking these medications    doxycycline 100 MG capsule Commonly known as: VIBRAMYCIN Replaced by: doxycycline 100 MG tablet   ibuprofen 800 MG tablet Commonly known as: ADVIL       TAKE these medications    allopurinol  300 MG tablet Commonly known as: ZYLOPRIM  Take 300 mg by mouth daily.   amLODipine  5 MG tablet Commonly known as: NORVASC  Take 1 tablet (5 mg total) by mouth daily.   aspirin  EC 81 MG tablet Take 1 tablet (81 mg total) by mouth daily.   carvedilol  12.5 MG tablet Commonly known as: COREG  Take 1 tablet (12.5 mg total) by mouth 2 (two) times daily with a meal.   colchicine  0.6 MG tablet Take 0.5 tablets (0.3 mg total) by mouth daily. What changed: how much to  take   cyanocobalamin  1000 MCG tablet Commonly known as: VITAMIN B12 Take 1,000 mcg by mouth daily.   doxycycline 100 MG tablet Commonly known as: VIBRA-TABS Take 1 tablet (100 mg total) by mouth every 12 (twelve) hours. Replaces: doxycycline 100 MG capsule   feeding supplement Liqd Take 237 mLs by mouth 2 (two) times daily between meals.   glipiZIDE 2.5 MG 24 hr tablet Commonly known as: GLUCOTROL XL Take 1 tablet by mouth daily.   metFORMIN 500 MG 24 hr tablet Commonly known as: GLUCOPHAGE-XR Take 500 mg by mouth 2 (two) times daily with a meal.   pantoprazole  20 MG tablet Commonly known as: PROTONIX  Take 20 mg by mouth daily.   risperiDONE 0.5 MG tablet Commonly known as: RISPERDAL TAKE 1 TAB BY MOUTH 2 TIMES DAILY FOR 30 DAYS START 1 IN AFTERNOON AROUND 2 PM AND TITRATE IF NEEDED   rosuvastatin  5 MG tablet Commonly known as: Crestor  Take 1 tablet (5 mg total) by mouth daily at 6 PM.   sertraline  25 MG tablet Commonly known as: ZOLOFT  Take 25 mg by mouth daily.        Contact information for follow-up providers  Overton Constance DASEN, MD Follow up on 06/17/2024.   Specialty: Infectious Diseases Why: Hospital Discharge Follow Up with Dr. Overton on 10/17 @ 9:45 am Contact information: [address] [name] [address] [phone]              Contact information for after-discharge care     Destination     [facility] .   Service: Skilled Nursing Contact information: [address] [place]  [address] [phone]                    Discharge Exam: Fredricka Weights   05/26/24 2126 05/28/24 0903 05/29/24 0545  Weight: 70.8 kg 70.8 kg 73 kg   General: NAD, chronically ill-appearing Cardiovascular: S1, S2 present Respiratory: CTAB Abdomen: Soft, nontender, nondistended, bowel sounds present Musculoskeletal: Trace bilateral pedal edema noted Skin: Normal Psychiatry: Unable to  assess  Condition at discharge: stable  The results of significant diagnostics from this hospitalization (including imaging, microbiology, ancillary and laboratory) are listed below for reference.   Imaging Studies: US  RENAL Result Date: 05/27/2024 CLINICAL DATA:  [id number] AKI (acute kidney injury) [id number]. EXAM: RENAL / URINARY TRACT ULTRASOUND COMPLETE COMPARISON:  None Available. FINDINGS: Right Kidney: Renal measurements: 4.1 x 5.9 x 11.1 cm = volume: 140 mL. Echogenicity within normal limits. No mass or hydronephrosis visualized. Left Kidney: Renal measurements: 4.3 x 6.2 x 12.2 cm = volume: 170.3 mL. Echogenicity within normal limits. No mass or hydronephrosis visualized. Bladder: Appears normal for degree of bladder distention. Other: None. IMPRESSION: Unremarkable renal ultrasound examination. Electronically Signed   By: Ree Molt M.D.   On: 05/27/2024 10:20   CT ANGIO HEAD NECK W WO CM (CODE STROKE) Result Date: 05/27/2024 CLINICAL DATA:  Initial evaluation for acute neuro deficit, stroke. EXAM: CT ANGIOGRAPHY HEAD AND NECK WITH AND WITHOUT CONTRAST CT VENOGRAM HEAD TECHNIQUE: Multidetector CT imaging of the head and neck was performed using the standard protocol during bolus administration of intravenous contrast. Multiplanar CT image reconstructions and MIPs were obtained to evaluate the vascular anatomy. Carotid stenosis measurements (when applicable) are obtained utilizing NASCET criteria, using the distal internal carotid diameter as the denominator. RADIATION DOSE REDUCTION: This exam was performed according to the departmental dose-optimization program which includes automated exposure control, adjustment of the mA and/or kV according to patient size and/or use of iterative reconstruction technique. CONTRAST:  75mL OMNIPAQUE  IOHEXOL  350 MG/ML SOLN COMPARISON:  Prior CT from earlier the same day. FINDINGS: CTA NECK FINDINGS Aortic arch: Visualized aortic arch within normal limits for  caliber with standard branch pattern. Aortic atherosclerosis. No visible stenosis about the origin the great vessels. Right carotid system: Right common and internal carotid arteries are patent without dissection. Calcified plaque about the proximal cervical right ICA without hemodynamically significant greater than 50% stenosis. Left carotid system: Left common and internal carotid arteries are patent without dissection. Mild atheromatous change about the left carotid bulb/proximal cervical left ICA without hemodynamically significant greater than 50% stenosis. Vertebral arteries: Both vertebral arteries arise from subclavian arteries. Focal 75% stenosis present at the origin of the right subclavian artery. Atheromatous change about the origins of the vertebral arteries with severe stenosis on the right (series 5, image 268). Vertebral arteries otherwise patent without stenosis or dissection. Skeleton: No worrisome osseous lesions.  Patient is edentulous. Other neck: No other acute finding. Upper chest: No other acute finding. Review of the MIP images confirms the above findings CTA HEAD FINDINGS Anterior  circulation: Atheromatous change about the carotid siphons without hemodynamically significant stenosis. A1 segments patent bilaterally. Normal anterior communicating complex. Anterior cerebral arteries patent without significant stenosis. No M1 stenosis or occlusion. No proximal MCA branch occlusion. Distal MCA branches perfused and symmetric. Posterior circulation: Left V4 segment dominant and widely patent without stenosis. Left PICA patent. Right vertebral artery widely patent to the takeoff of the right PICA of the largely occludes distally. Right PICA patent. Basilar patent without stenosis. Superior cerebellar arteries patent bilaterally. Both PCAs are patent to their distal aspects without hemodynamically significant stenosis. Venous sinuses: See below. Anatomic variants: As above.  No aneurysm. Review of  the MIP images confirms the above findings CT VENOGRAM FINDINGS Normal enhancement seen throughout the superior sagittal sinus to the torcula. Transverse and sigmoid sinuses are patent as are the jugular bulbs and proximal internal jugular veins. Straight sinus, vein of Galen, and internal cerebral veins are patent. No evidence for dural venous sinus thrombosis. No appreciable cortical vein abnormality. IMPRESSION: 1. Negative CTA for large vessel occlusion or other emergent finding. 2. Atheromatous change about the origin of the right vertebral artery with severe stenosis. Right V4 segment largely occludes beyond the takeoff of the right PICA. Dominant left vertebral artery widely patent. 3. 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 4. Atheromatous change about the carotid bifurcations and carotid siphons without hemodynamically significant stenosis. 5. Negative CT venogram. No evidence for dural venous sinus thrombosis. Aortic Atherosclerosis (ICD10-I70.0). Electronically Signed   By: Morene Hoard M.D.   On: 05/27/2024 01:28   CT VENOGRAM HEAD Result Date: 05/27/2024 CLINICAL DATA:  Initial evaluation for acute neuro deficit, stroke. EXAM: CT ANGIOGRAPHY HEAD AND NECK WITH AND WITHOUT CONTRAST CT VENOGRAM HEAD TECHNIQUE: Multidetector CT imaging of the head and neck was performed using the standard protocol during bolus administration of intravenous contrast. Multiplanar CT image reconstructions and MIPs were obtained to evaluate the vascular anatomy. Carotid stenosis measurements (when applicable) are obtained utilizing NASCET criteria, using the distal internal carotid diameter as the denominator. RADIATION DOSE REDUCTION: This exam was performed according to the departmental dose-optimization program which includes automated exposure control, adjustment of the mA and/or kV according to patient size and/or use of iterative reconstruction technique. CONTRAST:  75mL OMNIPAQUE  IOHEXOL   350 MG/ML SOLN COMPARISON:  Prior CT from earlier the same day. FINDINGS: CTA NECK FINDINGS Aortic arch: Visualized aortic arch within normal limits for caliber with standard branch pattern. Aortic atherosclerosis. No visible stenosis about the origin the great vessels. Right carotid system: Right common and internal carotid arteries are patent without dissection. Calcified plaque about the proximal cervical right ICA without hemodynamically significant greater than 50% stenosis. Left carotid system: Left common and internal carotid arteries are patent without dissection. Mild atheromatous change about the left carotid bulb/proximal cervical left ICA without hemodynamically significant greater than 50% stenosis. Vertebral arteries: Both vertebral arteries arise from subclavian arteries. Focal 75% stenosis present at the origin of the right subclavian artery. Atheromatous change about the origins of the vertebral arteries with severe stenosis on the right (series 5, image 268). Vertebral arteries otherwise patent without stenosis or dissection. Skeleton: No worrisome osseous lesions.  Patient is edentulous. Other neck: No other acute finding. Upper chest: No other acute finding. Review of the MIP images confirms the above findings CTA HEAD FINDINGS Anterior circulation: Atheromatous change about the carotid siphons without hemodynamically significant stenosis. A1 segments patent bilaterally. Normal anterior communicating complex. Anterior cerebral arteries patent without significant stenosis.  No M1 stenosis or occlusion. No proximal MCA branch occlusion. Distal MCA branches perfused and symmetric. Posterior circulation: Left V4 segment dominant and widely patent without stenosis. Left PICA patent. Right vertebral artery widely patent to the takeoff of the right PICA of the largely occludes distally. Right PICA patent. Basilar patent without stenosis. Superior cerebellar arteries patent bilaterally. Both PCAs are  patent to their distal aspects without hemodynamically significant stenosis. Venous sinuses: See below. Anatomic variants: As above.  No aneurysm. Review of the MIP images confirms the above findings CT VENOGRAM FINDINGS Normal enhancement seen throughout the superior sagittal sinus to the torcula. Transverse and sigmoid sinuses are patent as are the jugular bulbs and proximal internal jugular veins. Straight sinus, vein of Galen, and internal cerebral veins are patent. No evidence for dural venous sinus thrombosis. No appreciable cortical vein abnormality. IMPRESSION: 1. Negative CTA for large vessel occlusion or other emergent finding. 2. Atheromatous change about the origin of the right vertebral artery with severe stenosis. Right V4 segment largely occludes beyond the takeoff of the right PICA. Dominant left vertebral artery widely patent. 3. 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 4. Atheromatous change about the carotid bifurcations and carotid siphons without hemodynamically significant stenosis. 5. Negative CT venogram. No evidence for dural venous sinus thrombosis. Aortic Atherosclerosis (ICD10-I70.0). Electronically Signed   By: Morene Hoard M.D.   On: 05/27/2024 01:28   CT HEAD CODE STROKE WO CONTRAST` Result Date: 05/27/2024 CLINICAL DATA:  Code stroke. Initial evaluation for acute neuro deficit, stroke suspected. EXAM: CT HEAD WITHOUT CONTRAST TECHNIQUE: Contiguous axial images were obtained from the base of the skull through the vertex without intravenous contrast. RADIATION DOSE REDUCTION: This exam was performed according to the departmental dose-optimization program which includes automated exposure control, adjustment of the mA and/or kV according to patient size and/or use of iterative reconstruction technique. COMPARISON:  Initial evaluation FINDINGS: Brain: Age-related cerebral atrophy with chronic microvascular ischemic disease. Chronic left cerebellar infarct. No  acute intracranial hemorrhage. No acute large vessel territory infarct. No mass lesion or midline shift. No hydrocephalus or extra-axial fluid collection. Vascular: No abnormal hyperdense vessel. Scattered vascular calcifications noted within the carotid siphons. Skull: Scalp soft tissues and calvarium demonstrate no new finding. Sinuses/Orbits: Globes orbital soft tissues within normal limits. Paranasal sinuses are largely clear. No significant mastoid effusion. Other: None. ASPECTS [facility] Stroke Program Early CT Score) - Ganglionic level infarction (caudate, lentiform nuclei, internal capsule, insula, M1-M3 cortex): 7 - Supraganglionic infarction (M4-M6 cortex): 3 Total score (0-10 with 10 being normal): 10 IMPRESSION: 1. Stable head CT.  No acute intracranial abnormality. 2. ASPECTS is 10. 3. Age-related cerebral atrophy with chronic small vessel ischemic disease, with chronic left cerebellar infarct. These results were communicated to Dr. Vanessa at 12:25 am on 05/27/2024 by text page via the [facility] messaging system. Electronically Signed   By: Morene Hoard M.D.   On: 05/27/2024 00:25   DG Pelvis Portable Result Date: 05/26/2024 EXAM: 1 VIEW(S) XRAY OF THE PELVIS 05/26/2024 11:12:21 PM COMPARISON: None available. CLINICAL HISTORY: R index finger infx. FINDINGS: BONES AND JOINTS: Mild degenerative changes of the lower lumbar spine. No acute fracture. No focal osseous lesion. No joint dislocation. SOFT TISSUES: The soft tissues are unremarkable. IMPRESSION: 1. No acute abnormalities. Electronically signed by: Pinkie Pebbles MD 05/26/2024 11:21 PM EDT RP Workstation: [id number]   DG Hand Complete Left Result Date: 05/26/2024 EXAM: 3 or more VIEW(S) XRAY OF THE LEFT HAND 05/26/2024 11:12:21 PM COMPARISON:  None available. CLINICAL HISTORY: R index finger infx. R index finger infx. FINDINGS: BONES AND JOINTS: Comminuted, intraarticular fracture involving the base of the second distal phalanx with  mild ventral displacement of the dominant distal fracture fragment of the oblique view. Given overlying fragmentation, involvement of the middle phalanx is difficult to exclude, but not favored. Mild multifocal degenerative changes. No joint dislocation. SOFT TISSUES: Associated soft tissue swelling with possible open wound. IMPRESSION: 1. Comminuted intra-articular fracture at the base of the second distal phalanx, as above. 2. Associated soft tissue swelling with possible open wound. Electronically signed by: Pinkie Pebbles MD 05/26/2024 11:21 PM EDT RP Workstation: [id number]   CT Cervical Spine Wo Contrast Result Date: 05/26/2024 CLINICAL DATA:  Neck trauma (Age >= 65y) EXAM: CT CERVICAL SPINE WITHOUT CONTRAST TECHNIQUE: Multidetector CT imaging of the cervical spine was performed without intravenous contrast. Multiplanar CT image reconstructions were also generated. RADIATION DOSE REDUCTION: This exam was performed according to the departmental dose-optimization program which includes automated exposure control, adjustment of the mA and/or kV according to patient size and/or use of iterative reconstruction technique. COMPARISON:  None Available. FINDINGS: Alignment: Normal Skull base and vertebrae: No acute fracture. No primary bone lesion or focal pathologic process. Soft tissues and spinal canal: No prevertebral fluid or swelling. No visible canal hematoma. Disc levels: Degenerative disc disease most pronounced at C5-6 with disc space narrowing and spurring. Moderate bilateral degenerative facet disease. Upper chest: No acute findings Other: None IMPRESSION: Multilevel degenerative changes.  No acute bony abnormality. Electronically Signed   By: Franky Crease M.D.   On: 05/26/2024 22:53   CT Head Wo Contrast Result Date: 05/26/2024 CLINICAL DATA:  Head trauma, minor (Age >= 65y) EXAM: CT HEAD WITHOUT CONTRAST TECHNIQUE: Contiguous axial images were obtained from the base of the skull through the vertex  without intravenous contrast. RADIATION DOSE REDUCTION: This exam was performed according to the departmental dose-optimization program which includes automated exposure control, adjustment of the mA and/or kV according to patient size and/or use of iterative reconstruction technique. COMPARISON:  04/21/2024 FINDINGS: Brain: There is atrophy and chronic small vessel disease changes. No acute intracranial abnormality. Specifically, no hemorrhage, hydrocephalus, mass lesion, acute infarction, or significant intracranial injury. Old left cerebellar infarct, unchanged. Vascular: No hyperdense vessel or unexpected calcification. Skull: No acute calvarial abnormality. Sinuses/Orbits: No acute findings Other: None IMPRESSION: Atrophy, chronic microvascular disease. No acute intracranial abnormality. Chronic left cerebellar infarct. Electronically Signed   By: Franky Crease M.D.   On: 05/26/2024 22:51   DG Chest Portable 1 View Result Date: 05/26/2024 CLINICAL DATA:  Question of pneumonia. Slight confusion. Patient fell tonight. EXAM: PORTABLE CHEST 1 VIEW COMPARISON:  04/22/2024 FINDINGS: Cardiac enlargement. No vascular congestion or edema. Patient positioning limits evaluation but there appears to be infiltration in the right lower lung probably associated with some bronchiectasis. This could represent pneumonia or aspiration. Left lung is clear. No pleural effusion or pneumothorax. Mediastinal contours appear intact. Calcification of the aorta. Degenerative changes in the spine and shoulders. IMPRESSION: Cardiac enlargement. Infiltration suggested in the right lower lung with some bronchiectasis. This may be aspiration or pneumonia. Electronically Signed   By: Elsie Gravely M.D.   On: 05/26/2024 22:38    Microbiology: Results for orders placed or performed during the hospital encounter of 05/26/24  MRSA Next Gen by PCR, Nasal     Status: None   Collection Time: 05/28/24  8:01 AM   Specimen: Nasal Mucosa;  Nasal Swab  Result Value Ref  Range Status   MRSA by PCR Next Gen NOT DETECTED NOT DETECTED Final    Comment: (NOTE) The GeneXpert MRSA Assay (FDA approved for NASAL specimens only), is one component of a comprehensive MRSA colonization surveillance program. It is not intended to diagnose MRSA infection nor to guide or monitor treatment for MRSA infections. Test performance is not FDA approved in patients less than 64 years old. Performed at [facility], [address] [place], [name] [address]   Aerobic/Anaerobic Culture w Gram Stain (surgical/deep wound)     Status: None   Collection Time: 05/28/24 11:01 AM   Specimen: Wound  Result Value Ref Range Status   Specimen Description WOUND  Final   Special Requests LEFT INDEX FINGER A  Final   Gram Stain   Final    FEW WBC PRESENT, PREDOMINANTLY PMN MODERATE GRAM POSITIVE COCCI    Culture   Final    ABUNDANT STAPHYLOCOCCUS AUREUS NO ANAEROBES ISOLATED Performed at [facility], [address] [place], [name] [address]    Report Status 06/02/2024 FINAL  Final   Organism ID, Bacteria STAPHYLOCOCCUS AUREUS  Final      Susceptibility   Staphylococcus aureus - MIC*    CIPROFLOXACIN <=0.5 SENSITIVE Sensitive     ERYTHROMYCIN >=8 RESISTANT Resistant     GENTAMICIN <=0.5 SENSITIVE Sensitive     OXACILLIN 0.5 SENSITIVE Sensitive     TETRACYCLINE <=1 SENSITIVE Sensitive     VANCOMYCIN 1 SENSITIVE Sensitive     TRIMETH/SULFA <=10 SENSITIVE Sensitive     CLINDAMYCIN RESISTANT Resistant     RIFAMPIN <=0.5 SENSITIVE Sensitive     Inducible Clindamycin POSITIVE Resistant     LINEZOLID 2 SENSITIVE Sensitive     * ABUNDANT STAPHYLOCOCCUS AUREUS  Aerobic/Anaerobic Culture w Gram Stain (surgical/deep wound)     Status: None   Collection Time: 05/28/24 11:26 AM   Specimen: Wound  Result Value Ref Range Status   Specimen Description WOUND  Final   Special Requests LEFT INDEX FINGER B  Final   Gram Stain RARE WBC SEEN RARE  GRAM POSITIVE COCCI   Final   Culture   Final    RARE STAPHYLOCOCCUS AUREUS NO ANAEROBES ISOLATED Performed at [facility], [address] [place], [name] [address]    Report Status 06/02/2024 FINAL  Final   Organism ID, Bacteria STAPHYLOCOCCUS AUREUS  Final      Susceptibility   Staphylococcus aureus - MIC*    CIPROFLOXACIN <=0.5 SENSITIVE Sensitive     ERYTHROMYCIN >=8 RESISTANT Resistant     GENTAMICIN <=0.5 SENSITIVE Sensitive     OXACILLIN 0.5 SENSITIVE Sensitive     TETRACYCLINE <=1 SENSITIVE Sensitive     VANCOMYCIN 1 SENSITIVE Sensitive     TRIMETH/SULFA <=10 SENSITIVE Sensitive     CLINDAMYCIN RESISTANT Resistant     RIFAMPIN <=0.5 SENSITIVE Sensitive     Inducible Clindamycin POSITIVE Resistant     LINEZOLID 2 SENSITIVE Sensitive     * RARE STAPHYLOCOCCUS AUREUS  Aerobic/Anaerobic Culture w Gram Stain (surgical/deep wound)     Status: None   Collection Time: 05/28/24 11:45 AM   Specimen: Finger, Left; Tissue  Result Value Ref Range Status   Specimen Description TISSUE  Final   Special Requests LEFT FINGER C  Final   Gram Stain NO WBC SEEN RARE GRAM POSITIVE COCCI   Final   Culture   Final    FEW STAPHYLOCOCCUS AUREUS SUSCEPTIBILITIES PERFORMED ON PREVIOUS CULTURE WITHIN THE  LAST 5 DAYS. NO ANAEROBES ISOLATED Performed at [facility], [address], [place], [name] [address]    Report Status 06/02/2024 FINAL  Final    Labs: CBC: Recent Labs  Lab 06/02/24 0354 06/03/24 1540 06/04/24 0340  WBC 6.3 7.1 8.9  HGB 8.1* 8.5* 7.9*  HCT 24.7* 26.4* 24.4*  MCV 90.1 92.3 90.4  PLT 151 165 163   Basic Metabolic Panel: Recent Labs  Lab 06/01/24 1348 06/02/24 0354 06/03/24 1540 06/04/24 0340  NA 138 140 143 143  K 4.3 3.8 4.0 3.8  CL 109 110 114* 116*  CO2 19* 18* 19* 20*  GLUCOSE 114* 92 139* 103*  BUN 63* 57* 37* 31*  CREATININE 3.27* 2.82* 1.99* 1.80*  CALCIUM  8.7* 8.8* 8.9 8.7*   Liver Function Tests: Recent Labs  Lab  06/03/24 1540 06/04/24 0340  AST 37 32  ALT 29 28  ALKPHOS 65 62  BILITOT 0.7 0.7  PROT 5.6* 5.4*  ALBUMIN 3.0* 2.8*   CBG: Recent Labs  Lab 06/04/24 0608 06/04/24 1219 06/04/24 1700 06/04/24 2108 06/05/24 0615  GLUCAP 94 120* 149* 144* 89    Discharge time spent: greater than 30 minutes.  Signed: Lebron JINNY Cage, MD [facility] 06/08/2024

## 2024-06-08 NOTE — Progress Notes (Signed)
 Report given to Grayce at [facility].

## 2024-06-08 NOTE — Progress Notes (Signed)
   Palliative Medicine Inpatient Follow Up Note HPI: 83 y.o. female with PMH significant for dementia DM2, HTN, HLD, CHF, stroke, vitamin B12 deficiency, osteoarthritis 9/25, presented to ED from home after a fall. Per report, patient's dementia has been progressively worsening.  For the last 2 days at home, patient has not been eating or drinking, was more somnolent and also having hallucinations.  On 9/25, patient stood up and fell and is brought to ED. The PMT has been asked to support additional goals of care conversations.   Today's Discussion 06/08/2024  *Please note that this is a verbal dictation therefore any spelling or grammatical errors are due to the Dragon Medical One system interpretation.  Chart reviewed inclusive of vital signs, progress notes, laboratory results, and diagnostic images.  I met with Yvonne Newton at bedside this morning. She is awake and not in any distress. Denies pain, shortness of breath, nausea.  Plan for transition to [facility] with Op Palliative support today.  Questions and concerns addressed/Palliative Support Provided.   Objective Assessment: Vital Signs Vitals:   06/07/24 2038 06/08/24 0829  BP: (!) 157/56 (!) 155/47  Pulse: 76 63  Resp:  18  Temp:  98 F (36.7 C)  SpO2:  93%   No intake or output data in the 24 hours ending 06/08/24 1154  Last Weight  Most recent update: 05/29/2024  5:45 AM    Weight  73 kg (160 lb 15 oz)            Gen:  Elderly Caucasian F chronically ill appearing HEENT: Dry mucous membranes CV: Regular rate and rhythm  PULM:  On RA, breathing is even and nonlabored ABD: soft/nontender  EXT: (+) LE pedal edema Neuro: Alert and oriented x2 - forgetful  SUMMARY OF RECOMMENDATIONS   DNAR/DNI   Goals are for dignity, quality, and comfort  Minimize needle sticks and interventions   Continue IV antibiotics  Encourage oral intake  Will transition to SNF - Yvonne Newton  OP Palliative support will be offered  through [facility] ______________________________________________________________________________________ Yvonne Newton Palliative Medicine Team Team Cell Phone: [phone] Please utilize secure chat with additional questions, if there is no response within 30 minutes please call the above phone number  Time Spent: 25  Palliative Medicine Team providers are available by phone from 7am to 7pm daily and can be reached through the team cell phone.  Should this patient require assistance outside of these hours, please call the patient's attending physician.

## 2024-06-08 NOTE — Plan of Care (Signed)

## 2024-06-08 NOTE — TOC Progression Note (Signed)
 Transition of Care [facility]) - Progression Note    Patient Details  Name: Yvonne Newton MRN: [id number] Date of Birth: 18-Jan-1941  Transition of Care [facility]) CM/SW Contact  Lauraine FORBES Saa, LCSWA Phone Number: 06/08/2024, 10:15 AM  Clinical Narrative:     10:15 AM CSW was informed of patient's SNF insurance authorization and ambulance transport approval [id number] [facility] [phone] SNF). CSW informed [facility] SNF of approval. SNF confirmed they could admit patient. Medical team made aware.  Expected Discharge Plan: Skilled Nursing Facility Barriers to Discharge: English as a second language teacher, Continued Medical Work up               Expected Discharge Plan and Services In-house Referral: Clinical Social Work   Post Acute Care Choice: Skilled Nursing Facility Living arrangements for the past 2 months: Single Family Home                                       Social Drivers of Health (SDOH) Interventions SDOH Screenings   Food Insecurity: Patient Declined (05/27/2024)  Housing: Unknown (05/27/2024)  Transportation Needs: Patient Declined (05/27/2024)  Utilities: Patient Declined (05/27/2024)  Financial Resource Strain: Low Risk  (05/03/2024)   Received from [facility]  Social Connections: Patient Declined (05/27/2024)  Tobacco Use: Medium Risk (05/28/2024)    Readmission Risk Interventions     No data to display

## 2024-06-08 NOTE — Care Management Important Message (Signed)
 Important Message  Patient Details  Name: Yvonne Newton MRN: [id number] Date of Birth: 12-16-1940   Important Message Given:  Yes - Medicare IM     Claretta Deed 06/08/2024, 4:09 PM

## 2024-06-08 NOTE — Progress Notes (Signed)
 Mobility Specialist Progress Note:    06/08/24 1142  Mobility  Activity Ambulated with assistance (In room/ hallway)  Level of Assistance Contact guard assist, steadying assist  Assistive Device Front wheel walker  Distance Ambulated (ft) 20 ft  LUE Weight Bearing Per Provider Order NWB  Activity Response Tolerated well  Mobility Referral Yes  Mobility visit 1 Mobility  Mobility Specialist Start Time (ACUTE ONLY) 1113  Mobility Specialist Stop Time (ACUTE ONLY) 1125  Mobility Specialist Time Calculation (min) (ACUTE ONLY) 12 min   Received pt on [facility] w/ RN and agreeable to mobility. Pt required MinG for safety and RW management. Pt c/o fatigue, otherwise tolerated well. Returned to room without fault. Left pt in bed with alarm on. Personal belongings and call light within reach. All needs met.  Lavanda Pollack Mobility Specialist  Please contact via [employer] or  Rehab Office [phone]

## 2024-06-08 NOTE — TOC Transition Note (Signed)
 Transition of Care [facility]) - Discharge Note   Patient Details  Name: Yvonne Newton MRN: [id number] Date of Birth: 10/09/1940  Transition [facility]) CM/SW Contact:  Lauraine FORBES Saa, LCSWA Phone Number: 06/08/2024, 1:26 PM   Clinical Narrative:     Patient will DC to: [facility] SNF Anticipated DC date: 06/08/2024 Family notified: Koren Anon; Son; [phone] Transport by: [facility]   Per MD patient ready for DC to [facility] SNF. RN to call report prior to discharge [phone]). RN, patient, patient's family, and facility notified of DC. Discharge Summary and FL2 sent to facility. DC packet on chart. Ambulance transport requested for patient at 13:24.   CSW will sign off for now as social work intervention is no longer needed. Please consult us  again if new needs arise.    Final next level of care: Skilled Nursing Facility Barriers to Discharge: Barriers Resolved   Patient Goals and CMS Choice Patient states their goals for this hospitalization and ongoing recovery are:: SNF          Discharge Placement              Patient chooses bed at: [facility] Patient to be transferred to facility by: [facility] Name of family member notified: Loretto Belinsky; Son; [phone] Patient and family notified of of transfer: 06/08/24  Discharge Plan and Services Additional resources added to the After Visit Summary for   In-house Referral: Clinical Social Work   Post Acute Care Choice: Skilled Nursing Facility                               Social Drivers of Health (SDOH) Interventions SDOH Screenings   Food Insecurity: Patient Declined (05/27/2024)  Housing: Unknown (05/27/2024)  Transportation Needs: Patient Declined (05/27/2024)  Utilities: Patient Declined (05/27/2024)  Financial Resource Strain: Low Risk  (05/03/2024)   Received from [facility]  Social Connections: Patient Declined (05/27/2024)  Tobacco Use: Medium Risk (05/28/2024)      Readmission Risk Interventions     No data to display

## 2024-06-09 DIAGNOSIS — E785 Hyperlipidemia, unspecified: Secondary | ICD-10-CM | POA: Diagnosis not present

## 2024-06-09 DIAGNOSIS — D649 Anemia, unspecified: Secondary | ICD-10-CM | POA: Diagnosis not present

## 2024-06-09 DIAGNOSIS — M109 Gout, unspecified: Secondary | ICD-10-CM | POA: Diagnosis not present

## 2024-06-09 DIAGNOSIS — N183 Chronic kidney disease, stage 3 unspecified: Secondary | ICD-10-CM | POA: Diagnosis not present

## 2024-06-09 DIAGNOSIS — N179 Acute kidney failure, unspecified: Secondary | ICD-10-CM | POA: Diagnosis not present

## 2024-06-09 DIAGNOSIS — E1122 Type 2 diabetes mellitus with diabetic chronic kidney disease: Secondary | ICD-10-CM | POA: Diagnosis not present

## 2024-06-09 DIAGNOSIS — I509 Heart failure, unspecified: Secondary | ICD-10-CM | POA: Diagnosis not present

## 2024-06-09 DIAGNOSIS — S62609D Fracture of unspecified phalanx of unspecified finger, subsequent encounter for fracture with routine healing: Secondary | ICD-10-CM | POA: Diagnosis not present

## 2024-06-09 DIAGNOSIS — F0280 Dementia in other diseases classified elsewhere without behavioral disturbance: Secondary | ICD-10-CM | POA: Diagnosis not present

## 2024-06-09 DIAGNOSIS — I10 Essential (primary) hypertension: Secondary | ICD-10-CM | POA: Diagnosis not present

## 2024-06-13 ENCOUNTER — Encounter (HOSPITAL_COMMUNITY): Payer: Self-pay

## 2024-06-13 ENCOUNTER — Other Ambulatory Visit: Payer: Self-pay

## 2024-06-13 ENCOUNTER — Emergency Department (HOSPITAL_COMMUNITY)

## 2024-06-13 ENCOUNTER — Inpatient Hospital Stay (HOSPITAL_COMMUNITY)
Admission: EM | Admit: 2024-06-13 | Discharge: 2024-07-02 | DRG: 637 | Disposition: E | Attending: Internal Medicine | Admitting: Internal Medicine

## 2024-06-13 DIAGNOSIS — D649 Anemia, unspecified: Secondary | ICD-10-CM | POA: Diagnosis present

## 2024-06-13 DIAGNOSIS — Z91018 Allergy to other foods: Secondary | ICD-10-CM

## 2024-06-13 DIAGNOSIS — G9341 Metabolic encephalopathy: Secondary | ICD-10-CM | POA: Diagnosis present

## 2024-06-13 DIAGNOSIS — D72829 Elevated white blood cell count, unspecified: Secondary | ICD-10-CM | POA: Diagnosis present

## 2024-06-13 DIAGNOSIS — R627 Adult failure to thrive: Secondary | ICD-10-CM | POA: Diagnosis present

## 2024-06-13 DIAGNOSIS — Z89022 Acquired absence of left finger(s): Secondary | ICD-10-CM

## 2024-06-13 DIAGNOSIS — E1122 Type 2 diabetes mellitus with diabetic chronic kidney disease: Secondary | ICD-10-CM | POA: Diagnosis present

## 2024-06-13 DIAGNOSIS — Z880 Allergy status to penicillin: Secondary | ICD-10-CM

## 2024-06-13 DIAGNOSIS — R451 Restlessness and agitation: Secondary | ICD-10-CM

## 2024-06-13 DIAGNOSIS — I10 Essential (primary) hypertension: Secondary | ICD-10-CM | POA: Diagnosis present

## 2024-06-13 DIAGNOSIS — Z7189 Other specified counseling: Secondary | ICD-10-CM

## 2024-06-13 DIAGNOSIS — Z711 Person with feared health complaint in whom no diagnosis is made: Secondary | ICD-10-CM

## 2024-06-13 DIAGNOSIS — R630 Anorexia: Secondary | ICD-10-CM | POA: Diagnosis not present

## 2024-06-13 DIAGNOSIS — R52 Pain, unspecified: Secondary | ICD-10-CM

## 2024-06-13 DIAGNOSIS — E162 Hypoglycemia, unspecified: Secondary | ICD-10-CM | POA: Diagnosis not present

## 2024-06-13 DIAGNOSIS — F03918 Unspecified dementia, unspecified severity, with other behavioral disturbance: Secondary | ICD-10-CM | POA: Diagnosis present

## 2024-06-13 DIAGNOSIS — E11649 Type 2 diabetes mellitus with hypoglycemia without coma: Principal | ICD-10-CM | POA: Diagnosis present

## 2024-06-13 DIAGNOSIS — R638 Other symptoms and signs concerning food and fluid intake: Secondary | ICD-10-CM

## 2024-06-13 DIAGNOSIS — E876 Hypokalemia: Secondary | ICD-10-CM | POA: Diagnosis present

## 2024-06-13 DIAGNOSIS — F0392 Unspecified dementia, unspecified severity, with psychotic disturbance: Secondary | ICD-10-CM | POA: Diagnosis present

## 2024-06-13 DIAGNOSIS — I5032 Chronic diastolic (congestive) heart failure: Secondary | ICD-10-CM | POA: Diagnosis present

## 2024-06-13 DIAGNOSIS — Z8249 Family history of ischemic heart disease and other diseases of the circulatory system: Secondary | ICD-10-CM

## 2024-06-13 DIAGNOSIS — Z882 Allergy status to sulfonamides status: Secondary | ICD-10-CM

## 2024-06-13 DIAGNOSIS — Z87891 Personal history of nicotine dependence: Secondary | ICD-10-CM

## 2024-06-13 DIAGNOSIS — Z7984 Long term (current) use of oral hypoglycemic drugs: Secondary | ICD-10-CM

## 2024-06-13 DIAGNOSIS — M109 Gout, unspecified: Secondary | ICD-10-CM | POA: Diagnosis present

## 2024-06-13 DIAGNOSIS — N1832 Chronic kidney disease, stage 3b: Secondary | ICD-10-CM | POA: Diagnosis present

## 2024-06-13 DIAGNOSIS — I130 Hypertensive heart and chronic kidney disease with heart failure and stage 1 through stage 4 chronic kidney disease, or unspecified chronic kidney disease: Secondary | ICD-10-CM | POA: Diagnosis present

## 2024-06-13 DIAGNOSIS — Z888 Allergy status to other drugs, medicaments and biological substances status: Secondary | ICD-10-CM

## 2024-06-13 DIAGNOSIS — Z79899 Other long term (current) drug therapy: Secondary | ICD-10-CM

## 2024-06-13 DIAGNOSIS — E119 Type 2 diabetes mellitus without complications: Secondary | ICD-10-CM

## 2024-06-13 DIAGNOSIS — K219 Gastro-esophageal reflux disease without esophagitis: Secondary | ICD-10-CM | POA: Diagnosis present

## 2024-06-13 DIAGNOSIS — Z8673 Personal history of transient ischemic attack (TIA), and cerebral infarction without residual deficits: Secondary | ICD-10-CM

## 2024-06-13 DIAGNOSIS — F0393 Unspecified dementia, unspecified severity, with mood disturbance: Secondary | ICD-10-CM | POA: Diagnosis present

## 2024-06-13 DIAGNOSIS — E785 Hyperlipidemia, unspecified: Secondary | ICD-10-CM | POA: Diagnosis present

## 2024-06-13 DIAGNOSIS — E669 Obesity, unspecified: Secondary | ICD-10-CM | POA: Diagnosis present

## 2024-06-13 DIAGNOSIS — Z515 Encounter for palliative care: Secondary | ICD-10-CM

## 2024-06-13 DIAGNOSIS — Z887 Allergy status to serum and vaccine status: Secondary | ICD-10-CM

## 2024-06-13 DIAGNOSIS — Z9049 Acquired absence of other specified parts of digestive tract: Secondary | ICD-10-CM | POA: Diagnosis not present

## 2024-06-13 DIAGNOSIS — Z66 Do not resuscitate: Secondary | ICD-10-CM | POA: Diagnosis present

## 2024-06-13 DIAGNOSIS — R109 Unspecified abdominal pain: Secondary | ICD-10-CM | POA: Diagnosis not present

## 2024-06-13 DIAGNOSIS — R1114 Bilious vomiting: Secondary | ICD-10-CM | POA: Diagnosis not present

## 2024-06-13 DIAGNOSIS — Z7982 Long term (current) use of aspirin: Secondary | ICD-10-CM

## 2024-06-13 LAB — CBC WITH DIFFERENTIAL/PLATELET
Abs Immature Granulocytes: 0.04 K/uL (ref 0.00–0.07)
Basophils Absolute: 0 K/uL (ref 0.0–0.1)
Basophils Relative: 0 %
Eosinophils Absolute: 0 K/uL (ref 0.0–0.5)
Eosinophils Relative: 0 %
HCT: 30.4 % — ABNORMAL LOW (ref 36.0–46.0)
Hemoglobin: 9.9 g/dL — ABNORMAL LOW (ref 12.0–15.0)
Immature Granulocytes: 0 %
Lymphocytes Relative: 10 %
Lymphs Abs: 1.2 K/uL (ref 0.7–4.0)
MCH: 28.9 pg (ref 26.0–34.0)
MCHC: 32.6 g/dL (ref 30.0–36.0)
MCV: 88.9 fL (ref 80.0–100.0)
Monocytes Absolute: 0.8 K/uL (ref 0.1–1.0)
Monocytes Relative: 7 %
Neutro Abs: 9.3 K/uL — ABNORMAL HIGH (ref 1.7–7.7)
Neutrophils Relative %: 83 %
Platelets: 202 K/uL (ref 150–400)
RBC: 3.42 MIL/uL — ABNORMAL LOW (ref 3.87–5.11)
RDW: 16.9 % — ABNORMAL HIGH (ref 11.5–15.5)
WBC: 11.4 K/uL — ABNORMAL HIGH (ref 4.0–10.5)
nRBC: 0 % (ref 0.0–0.2)

## 2024-06-13 LAB — COMPREHENSIVE METABOLIC PANEL WITH GFR
ALT: 20 U/L (ref 0–44)
AST: 34 U/L (ref 15–41)
Albumin: 3.8 g/dL (ref 3.5–5.0)
Alkaline Phosphatase: 112 U/L (ref 38–126)
Anion gap: 12 (ref 5–15)
BUN: 22 mg/dL (ref 8–23)
CO2: 26 mmol/L (ref 22–32)
Calcium: 8.8 mg/dL — ABNORMAL LOW (ref 8.9–10.3)
Chloride: 101 mmol/L (ref 98–111)
Creatinine, Ser: 1.35 mg/dL — ABNORMAL HIGH (ref 0.44–1.00)
GFR, Estimated: 39 mL/min — ABNORMAL LOW (ref >60)
Glucose, Bld: 98 mg/dL (ref 70–99)
Potassium: 3.2 mmol/L — ABNORMAL LOW (ref 3.5–5.1)
Sodium: 139 mmol/L (ref 135–145)
Total Bilirubin: 1 mg/dL (ref 0.0–1.2)
Total Protein: 6 g/dL — ABNORMAL LOW (ref 6.5–8.1)

## 2024-06-13 LAB — CBG MONITORING, ED
Glucose-Capillary: 69 mg/dL — ABNORMAL LOW (ref 70–99)
Glucose-Capillary: 88 mg/dL (ref 70–99)
Glucose-Capillary: 78 mg/dL (ref 70–99)

## 2024-06-13 LAB — URINALYSIS, ROUTINE W REFLEX MICROSCOPIC
Bilirubin Urine: NEGATIVE
Glucose, UA: NEGATIVE mg/dL
Hgb urine dipstick: NEGATIVE
Ketones, ur: NEGATIVE mg/dL
Leukocytes,Ua: NEGATIVE
Nitrite: NEGATIVE
Protein, ur: NEGATIVE mg/dL
Specific Gravity, Urine: 1.013 (ref 1.005–1.030)
pH: 5 (ref 5.0–8.0)

## 2024-06-13 MED ORDER — IOHEXOL 300 MG/ML  SOLN
80.0000 mL | Freq: Once | INTRAMUSCULAR | Status: AC | PRN
Start: 2024-06-13 — End: 2024-06-13
  Administered 2024-06-13: 80 mL via INTRAVENOUS

## 2024-06-13 MED ORDER — DEXTROSE 10 % IV SOLN: INTRAVENOUS | Status: DC

## 2024-06-13 MED ORDER — ONDANSETRON HCL 4 MG/2ML IJ SOLN
4.0000 mg | Freq: Once | INTRAMUSCULAR | Status: AC
  Administered 2024-06-13: 4 mg via INTRAVENOUS
  Filled 2024-06-13: qty 2

## 2024-06-13 NOTE — ED Triage Notes (Signed)
 Pt bib EMS from This morning pt CBG dropped 38 gave glucagel and glucagon. CBG was 40 at later check. Altered more this week. 20 RAC 250 mL D10. CBG 176 dropped to 103 restarted D10 brought it up to 154 CBG at last check. Takes metformin and glipizide. Pt is still on doxycyline from finger amputation last month? Dementia at baseline. BP 154/  HR 78  O2 96% RA

## 2024-06-13 NOTE — ED Provider Notes (Addendum)
 [place] EMERGENCY DEPARTMENT AT [facility] Provider Note   CSN: [id number] Arrival date & time: 06/13/24  1850     Patient presents with: Hypoglycemia   Yvonne Newton is a 83 y.o. female.   Patient brought to the emergency department by EMS due to low glucose today.  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  Patient was discharged on 10/8 to a skilled nursing facility.  EMS reports patient's glucose was low today.  Facility reports patient's glucose was 38.  She was given GlucoGel.  EMS reports patient's glucose was low on the way here and she was given 250 mL of D10.  Patient's family reports that patient has not been eating since being discharged to the nursing home.  They report patient has complained of abdominal pain and reflux.  She has a past medical history of reflux and they are not sure if she is currently getting the Protonix  that she has been prescribed.  The history is provided by the patient and the EMS personnel. No language interpreter was used.  Hypoglycemia Initial blood sugar:  38 Progression:  Worsening Diabetic status:  Controlled with oral medications Context: decreased oral intake   Relieved by:  None tried Ineffective treatments:  Glucagon and oral glucose Associated symptoms: altered mental status        Prior to Admission medications   Medication Sig Start Date End Date Taking? Authorizing Provider  allopurinol  (ZYLOPRIM ) 300 MG tablet Take 300 mg by mouth daily. 05/03/24   [provider]  amLODipine  (NORVASC ) 5 MG tablet Take 1 tablet (5 mg total) by mouth daily. 06/08/24   Ezenduka, Nkeiruka J, MD  aspirin  EC 81 MG EC tablet Take 1 tablet (81 mg total) by mouth daily. 11/05/17   Rizwan, Saima, MD  carvedilol  (COREG ) 12.5 MG tablet Take 1 tablet (12.5 mg total) by mouth 2 (two) times daily with a meal. 09/16/23   Waddell Danelle ORN, MD  colchicine  0.6 MG tablet Take 0.5 tablets (0.3  mg total) by mouth daily. 06/08/24   Ezenduka, Nkeiruka J, MD  cyanocobalamin  (VITAMIN B12) 1000 MCG tablet Take 1,000 mcg by mouth daily.    [provider]  diltiazem  (CARDIZEM  CD) 120 MG 24 hr capsule TAKE 1 CAPSULE BY MOUTH EVERY DAY 12/02/23   Waddell Danelle ORN, MD  doxycycline (VIBRA-TABS) 100 MG tablet Take 1 tablet (100 mg total) by mouth every 12 (twelve) hours. 06/08/24 07/09/24  Ezenduka, Nkeiruka J, MD  feeding supplement (ENSURE PLUS HIGH PROTEIN) LIQD Take 237 mLs by mouth 2 (two) times daily between meals. 06/08/24   Ezenduka, Nkeiruka J, MD  furosemide  (LASIX ) 40 MG tablet TAKE 2 TABLETS BY MOUTH EVERY DAY 03/31/24   Lee, Jordan, NP  glipiZIDE (GLUCOTROL XL) 2.5 MG 24 hr tablet Take 1 tablet by mouth daily. 09/17/23   [provider]  losartan  (COZAAR ) 25 MG tablet TAKE 1/2 TABLET BY MOUTH DAILY 12/15/23   [place]ette [place] A, FNP  metFORMIN (GLUCOPHAGE-XR) 500 MG 24 hr tablet Take 500 mg by mouth 2 (two) times daily with a meal. 01/25/16   [provider]  pantoprazole  (PROTONIX ) 20 MG tablet Take 20 mg by mouth daily. 06/18/23   [provider]  potassium chloride  SA (KLOR-CON  M) 20 MEQ tablet TAKE 2 TABLETS BY MOUTH EVERY DAY Patient taking differently: Take 20 mEq by mouth 2 (two) times daily. 03/31/24   Lee, [place], NP  risperiDONE (RISPERDAL) 0.5  MG tablet TAKE 1 TAB BY MOUTH 2 TIMES DAILY FOR 30 DAYS START 1 IN AFTERNOON AROUND 2 PM AND TITRATE IF NEEDED Patient not taking: Reported on 05/27/2024 05/17/24   [provider]  rosuvastatin  (CRESTOR ) 5 MG tablet Take 1 tablet (5 mg total) by mouth daily at 6 PM. 01/19/18   Whitfield Raisin, NP  sertraline  (ZOLOFT ) 25 MG tablet Take 25 mg by mouth daily. 05/05/24 05/05/25  [provider]    Allergies: Blueberry flavoring agent (non-screening), Influenza vaccines, Penicillins, Sulfa antibiotics, Sulfonamide derivatives, and Other    Review of Systems  All other systems reviewed and are  negative.   Updated Vital Signs BP (!) 145/75 (BP Location: Left Arm)   Pulse 71   Temp 98.4 F (36.9 C) (Oral)   Resp 18   Ht 5' 5 (1.651 m)   Wt 73 kg   SpO2 99%   BMI 26.78 kg/m   Physical Exam Vitals and nursing note reviewed.  Constitutional:      Appearance: She is well-developed.  HENT:     Head: Normocephalic.     Mouth/Throat:     Mouth: Mucous membranes are moist.  Eyes:     Pupils: Pupils are equal, round, and reactive to light.  Cardiovascular:     Rate and Rhythm: Normal rate.  Pulmonary:     Effort: Pulmonary effort is normal.  Abdominal:     General: There is no distension.  Musculoskeletal:        General: Normal range of motion.     Cervical back: Normal range of motion.  Skin:    General: Skin is warm.  Neurological:     General: No focal deficit present.     Mental Status: She is alert and oriented to person, place, and time.  Psychiatric:        Mood and Affect: Mood normal.     (all labs ordered are listed, but only abnormal results are displayed) Labs Reviewed  CBC WITH DIFFERENTIAL/PLATELET - Abnormal; Notable for the following components:      Result Value   WBC 11.4 (*)    RBC 3.42 (*)    Hemoglobin 9.9 (*)    HCT 30.4 (*)    RDW 16.9 (*)    Neutro Abs 9.3 (*)    All other components within normal limits  COMPREHENSIVE METABOLIC PANEL WITH GFR - Abnormal; Notable for the following components:   Potassium 3.2 (*)    Creatinine, Ser 1.35 (*)    Calcium  8.8 (*)    Total Protein 6.0 (*)    GFR, Estimated 39 (*)    All other components within normal limits  CBG MONITORING, ED - Abnormal; Notable for the following components:   Glucose-Capillary 69 (*)    All other components within normal limits  URINALYSIS, ROUTINE W REFLEX MICROSCOPIC  CBG MONITORING, ED    EKG: None  Radiology: No results found.   Procedures   Medications Ordered in the ED  ondansetron  (ZOFRAN ) injection 4 mg (has no administration in time range)   dextrose  10 % infusion (has no administration in time range)                                    Medical Decision Making Patient was discharged from the hospital on 10 8 after being hospitalized on 926 for a finger infection which required a amputation.  Patient has not  been eating or drinking since going to the skilled nursing facility.  EMS was called out today because patient's glucose was low.  Amount and/or Complexity of Data Reviewed Independent Historian:     Details: Patient is here with her daughter and grandson.  They report patient has been having abdominal pain and indigestion.  They reports she is supposed to be taking Protonix  for reflux but they are unsure if she has been getting at the facility Labs: ordered. Decision-making details documented in ED Course.    Details: Labs ordered reviewed and interpreted.  Potassium is 3.2 creatinine is 1.35.  White blood cell count is 11.4 hemoglobin is 9.9. Radiology: ordered and independent interpretation performed. Decision-making details documented in ED Course.    Details: Ct abdomen no acute findings.  Discussion of management or test interpretation with external provider(s): Hospitalist consulted for admission.   Risk Prescription drug management. Risk Details: Pt encouraged to eat.  Family member reports pt ate 2 small bites of a sandwich.  Pt drank 4 ounces of juice.  Pt on D10 drip at 100cc an hour.  Repeat cbg at 10:00pm is 78.        Final diagnoses:  Hypoglycemia  Failure to thrive in adult  Anorexia    ED Discharge Orders     None          Flint Sonny MARLA DEVONNA 06/13/24 2154    Dyan Labarbera K, PA-C 06/13/24 2219    Cottie Donnice PARAS, MD 06/13/24 2303

## 2024-06-14 DIAGNOSIS — E162 Hypoglycemia, unspecified: Principal | ICD-10-CM | POA: Diagnosis present

## 2024-06-14 DIAGNOSIS — K219 Gastro-esophageal reflux disease without esophagitis: Secondary | ICD-10-CM

## 2024-06-14 DIAGNOSIS — N1832 Chronic kidney disease, stage 3b: Secondary | ICD-10-CM

## 2024-06-14 DIAGNOSIS — E119 Type 2 diabetes mellitus without complications: Secondary | ICD-10-CM

## 2024-06-14 DIAGNOSIS — E876 Hypokalemia: Secondary | ICD-10-CM

## 2024-06-14 DIAGNOSIS — I10 Essential (primary) hypertension: Secondary | ICD-10-CM

## 2024-06-14 DIAGNOSIS — F03918 Unspecified dementia, unspecified severity, with other behavioral disturbance: Secondary | ICD-10-CM

## 2024-06-14 DIAGNOSIS — E785 Hyperlipidemia, unspecified: Secondary | ICD-10-CM

## 2024-06-14 LAB — IRON AND TIBC
Iron: 94 ug/dL (ref 28–170)
Saturation Ratios: 36 % — ABNORMAL HIGH (ref 10.4–31.8)
TIBC: 262 ug/dL (ref 250–450)
UIBC: 168 ug/dL

## 2024-06-14 LAB — BASIC METABOLIC PANEL WITH GFR
Anion gap: 11 (ref 5–15)
BUN: 20 mg/dL (ref 8–23)
CO2: 25 mmol/L (ref 22–32)
Calcium: 8.6 mg/dL — ABNORMAL LOW (ref 8.9–10.3)
Chloride: 98 mmol/L (ref 98–111)
Creatinine, Ser: 1.13 mg/dL — ABNORMAL HIGH (ref 0.44–1.00)
GFR, Estimated: 48 mL/min — ABNORMAL LOW (ref >60)
Glucose, Bld: 140 mg/dL — ABNORMAL HIGH (ref 70–99)
Potassium: 3.3 mmol/L — ABNORMAL LOW (ref 3.5–5.1)
Sodium: 134 mmol/L — ABNORMAL LOW (ref 135–145)

## 2024-06-14 LAB — CBC
HCT: 29.2 % — ABNORMAL LOW (ref 36.0–46.0)
Hemoglobin: 9.4 g/dL — ABNORMAL LOW (ref 12.0–15.0)
MCH: 29.3 pg (ref 26.0–34.0)
MCHC: 32.2 g/dL (ref 30.0–36.0)
MCV: 91 fL (ref 80.0–100.0)
Platelets: 193 K/uL (ref 150–400)
RBC: 3.21 MIL/uL — ABNORMAL LOW (ref 3.87–5.11)
RDW: 16.9 % — ABNORMAL HIGH (ref 11.5–15.5)
WBC: 10.3 K/uL (ref 4.0–10.5)
nRBC: 0 % (ref 0.0–0.2)

## 2024-06-14 LAB — MAGNESIUM: Magnesium: 1.4 mg/dL — ABNORMAL LOW (ref 1.7–2.4)

## 2024-06-14 LAB — GLUCOSE, CAPILLARY
Glucose-Capillary: 153 mg/dL — ABNORMAL HIGH (ref 70–99)
Glucose-Capillary: 159 mg/dL — ABNORMAL HIGH (ref 70–99)
Glucose-Capillary: 166 mg/dL — ABNORMAL HIGH (ref 70–99)
Glucose-Capillary: 184 mg/dL — ABNORMAL HIGH (ref 70–99)
Glucose-Capillary: 236 mg/dL — ABNORMAL HIGH (ref 70–99)
Glucose-Capillary: 247 mg/dL — ABNORMAL HIGH (ref 70–99)

## 2024-06-14 LAB — FOLATE: Folate: 8.5 ng/mL (ref >5.9)

## 2024-06-14 LAB — VITAMIN B12: Vitamin B-12: 2187 pg/mL — ABNORMAL HIGH (ref 180–914)

## 2024-06-14 LAB — CBG MONITORING, ED: Glucose-Capillary: 133 mg/dL — ABNORMAL HIGH (ref 70–99)

## 2024-06-14 LAB — FERRITIN: Ferritin: 145 ng/mL (ref 11–307)

## 2024-06-14 MED ORDER — ALLOPURINOL 300 MG PO TABS
300.0000 mg | ORAL_TABLET | Freq: Every day | ORAL | Status: DC
  Administered 2024-06-14: 300 mg via ORAL
  Filled 2024-06-14 (×3): qty 1

## 2024-06-14 MED ORDER — MAGNESIUM SULFATE 4 GM/100ML IV SOLN
4.0000 g | Freq: Once | INTRAVENOUS | Status: AC
  Administered 2024-06-14: 4 g via INTRAVENOUS
  Filled 2024-06-14: qty 100

## 2024-06-14 MED ORDER — ASPIRIN 81 MG PO TBEC
81.0000 mg | DELAYED_RELEASE_TABLET | Freq: Every day | ORAL | Status: DC
  Administered 2024-06-14: 81 mg via ORAL
  Filled 2024-06-14 (×3): qty 1

## 2024-06-14 MED ORDER — ENOXAPARIN SODIUM 40 MG/0.4ML IJ SOSY
40.0000 mg | PREFILLED_SYRINGE | INTRAMUSCULAR | Status: DC
  Administered 2024-06-14 – 2024-06-19 (×6): 40 mg via SUBCUTANEOUS
  Filled 2024-06-14 (×7): qty 0.4

## 2024-06-14 MED ORDER — PANTOPRAZOLE SODIUM 40 MG PO TBEC
40.0000 mg | DELAYED_RELEASE_TABLET | Freq: Every day | ORAL | Status: DC
  Administered 2024-06-14: 40 mg via ORAL
  Filled 2024-06-14 (×2): qty 1

## 2024-06-14 MED ORDER — ROSUVASTATIN CALCIUM 5 MG PO TABS
5.0000 mg | ORAL_TABLET | Freq: Every day | ORAL | Status: DC
  Administered 2024-06-14: 5 mg via ORAL
  Filled 2024-06-14: qty 1

## 2024-06-14 MED ORDER — CARVEDILOL 12.5 MG PO TABS
12.5000 mg | ORAL_TABLET | Freq: Two times a day (BID) | ORAL | Status: DC
  Administered 2024-06-14 – 2024-06-21 (×7): 12.5 mg via ORAL
  Filled 2024-06-14 (×9): qty 1

## 2024-06-14 MED ORDER — DOXYCYCLINE HYCLATE 100 MG PO TABS
100.0000 mg | ORAL_TABLET | Freq: Two times a day (BID) | ORAL | Status: DC
  Administered 2024-06-14 (×3): 100 mg via ORAL
  Filled 2024-06-14 (×3): qty 1

## 2024-06-14 MED ORDER — ACETAMINOPHEN 325 MG PO TABS: 650.0000 mg | ORAL_TABLET | Freq: Four times a day (QID) | ORAL | Status: DC | PRN

## 2024-06-14 MED ORDER — RISPERIDONE 1 MG PO TABS
0.5000 mg | ORAL_TABLET | Freq: Two times a day (BID) | ORAL | Status: DC
  Administered 2024-06-14 – 2024-06-21 (×9): 0.5 mg via ORAL
  Filled 2024-06-14 (×11): qty 1

## 2024-06-14 MED ORDER — SODIUM CHLORIDE 0.9 % IV BOLUS
500.0000 mL | Freq: Once | INTRAVENOUS | Status: AC
  Administered 2024-06-14: 500 mL via INTRAVENOUS

## 2024-06-14 MED ORDER — DEXTROSE-SODIUM CHLORIDE 5-0.9 % IV SOLN
INTRAVENOUS | Status: AC
Start: 2024-06-14 — End: 2024-06-15

## 2024-06-14 MED ORDER — POTASSIUM CHLORIDE CRYS ER 20 MEQ PO TBCR
40.0000 meq | EXTENDED_RELEASE_TABLET | Freq: Once | ORAL | Status: AC
  Administered 2024-06-14: 40 meq via ORAL
  Filled 2024-06-14: qty 2

## 2024-06-14 MED ORDER — ACETAMINOPHEN 650 MG RE SUPP: 650.0000 mg | Freq: Four times a day (QID) | RECTAL | Status: DC | PRN

## 2024-06-14 MED ORDER — ENSURE PLUS HIGH PROTEIN PO LIQD
237.0000 mL | Freq: Two times a day (BID) | ORAL | Status: DC
  Administered 2024-06-20: 237 mL via ORAL

## 2024-06-14 MED ORDER — SERTRALINE HCL 25 MG PO TABS
25.0000 mg | ORAL_TABLET | Freq: Every day | ORAL | Status: DC
  Administered 2024-06-14 – 2024-06-17 (×2): 25 mg via ORAL
  Filled 2024-06-14 (×3): qty 1

## 2024-06-14 MED ORDER — AMLODIPINE BESYLATE 5 MG PO TABS
5.0000 mg | ORAL_TABLET | Freq: Every day | ORAL | Status: DC
  Administered 2024-06-14 – 2024-06-16 (×2): 5 mg via ORAL
  Filled 2024-06-14 (×2): qty 1

## 2024-06-14 MED ORDER — BOOST HIGH PROTEIN PO LIQD
237.0000 mL | Freq: Two times a day (BID) | ORAL | Status: DC
  Filled 2024-06-14 (×2): qty 237

## 2024-06-14 MED ORDER — ONDANSETRON HCL 4 MG/2ML IJ SOLN
4.0000 mg | Freq: Four times a day (QID) | INTRAMUSCULAR | Status: DC | PRN
  Administered 2024-06-14 – 2024-06-19 (×2): 4 mg via INTRAVENOUS
  Filled 2024-06-14 (×2): qty 2

## 2024-06-14 MED ORDER — POTASSIUM CHLORIDE CRYS ER 10 MEQ PO TBCR
40.0000 meq | EXTENDED_RELEASE_TABLET | Freq: Once | ORAL | Status: AC
Start: 2024-06-14 — End: 2024-06-14
  Administered 2024-06-14: 40 meq via ORAL
  Filled 2024-06-14: qty 4

## 2024-06-14 MED ORDER — VITAMIN B-12 1000 MCG PO TABS
1000.0000 ug | ORAL_TABLET | Freq: Every day | ORAL | Status: DC
  Administered 2024-06-14: 1000 ug via ORAL
  Filled 2024-06-14 (×3): qty 1

## 2024-06-14 NOTE — H&P (Signed)
 History and Physical    [name] FMW:[id number] DOB: 10-Aug-1941 DOA: 06/13/2024  PCP: [facility]  Patient coming from: Home  Chief Complaint: Low blood glucose  HPI: Yvonne Newton is a 83 y.o. female with medical history significant of CVA, HFpEF, dementia, type 2 diabetes on metformin and glipizide, CKD stage IIIb, hypertension, hyperlipidemia, gout, osteoarthritis, chronic anemia, GERD.  Recently admitted to the hospital 9/25-10/04/2024 for fall/syncope and collapse, progressively worsening dementia and not eating/drinking, more somnolent and having hallucinations.  She was found to have AKI, pyogenic flexor tenosynovitis due to MSSA status post I&D and partial amputation of left index finger on 9/27 treated with IV daptomycin and infectious disease had recommended outpatient doxycycline until 07/09/2024.  Palliative care had a goals of care discussion and CODE STATUS changed to DNR/DNI.  She was discharged to SNF.  Patient presents to the ED today via EMS from SNF for evaluation of hypoglycemia with glucose as low as 30s at her facility.  She was given glucose gel.  Remained hypoglycemic with EMS and was given 250 mL of D10.  Family reported patient has not been eating or drinking since being discharged to SNF.  They reported that patient has complained of abdominal pain and reflux. Afebrile.  Labs notable for WBC count 11.4, hemoglobin 9.9 (stable), potassium 3.2, creatinine 1.35 (improved), normal LFTs, UA not suggestive of infection.  CT abdomen pelvis showing no acute findings.  Glucose initially in the 90s but later dropped to 69.  Patient was started on D10 drip at 100 mL/hr.  Patient has dementia and not able to give a meaningful history.  Oriented to self only.  Resting comfortably watching television.  Review of Systems:  Review of Systems  All other systems reviewed and are negative.   Past Medical History:  Diagnosis Date   Arthritis 07/22/2017    Cerebellar stroke (HCC)    CHF (congestive heart failure) (HCC)    Dementia (HCC)    Diabetes mellitus type 2 in nonobese (HCC)    DM (diabetes mellitus) (HCC)    Dysarthria    Essential hypertension 07/22/2017   Hyperlipidemia, unspecified 07/22/2017   OA (osteoarthritis) of knee    right   Obesity    Stroke (HCC)    Tachycardia 07/22/2017   VENTRICULAR TACHYCARDIA 09/05/2009   Qualifier: Diagnosis of  By: Lawernce CMA, Jewel     Ventricular tachycardia (HCC)    Vitamin B 12 deficiency     Past Surgical History:  Procedure Laterality Date   AMPUTATION FINGER Left 05/28/2024   Procedure: AMPUTATION, FINGER;  Surgeon: Delene Marsa HERO, MD;  Location: [facility] OR;  Service: Orthopedics;  Laterality: Left;  PARTIAL   BREAST BIOPSY Left 2010   Negative   CORONARY PRESSURE/FFR STUDY N/A 07/14/2022   Procedure: INTRAVASCULAR PRESSURE WIRE/FFR STUDY;  Surgeon: Darron Deatrice LABOR, MD;  Location: [facility] INVASIVE CV LAB;  Service: Cardiovascular;  Laterality: N/A;   INCISION AND DRAINAGE OF WOUND Left 05/28/2024   Procedure: IRRIGATION AND DEBRIDEMENT WOUND;  Surgeon: Delene Marsa HERO, MD;  Location: [facility] OR;  Service: Orthopedics;  Laterality: Left;  LEFT INDEX FINGER and HAND   LEFT HEART CATH AND CORONARY ANGIOGRAPHY N/A 07/14/2022   Procedure: LEFT HEART CATH AND CORONARY ANGIOGRAPHY;  Surgeon: Darron Deatrice LABOR, MD;  Location: [facility] INVASIVE CV LAB;  Service: Cardiovascular;  Laterality: N/A;   LOOP RECORDER INSERTION N/A 11/03/2017   Procedure: LOOP RECORDER INSERTION;  Surgeon: Inocencio Soyla Lunger, MD;  Location: [facility];  Service: Cardiovascular;  Laterality: N/A;   TEE WITHOUT CARDIOVERSION N/A 11/03/2017   Procedure: TRANSESOPHAGEAL ECHOCARDIOGRAM (TEE);  Surgeon: Maranda Leim DEL, MD;  Location: [facility];  Service: Cardiovascular;  Laterality: N/A;     reports that she quit smoking about 25 years ago. Her smoking use included cigarettes. She has never used smokeless  tobacco. She reports that she does not drink alcohol and does not use drugs.  Allergies  Allergen Reactions   Blueberry Flavoring Agent (Non-Screening) Hives and Shortness Of Breath   Influenza Vaccines    Penicillins Itching   Sulfa Antibiotics Itching   Sulfonamide Derivatives Itching   Other Swelling and Rash    METAL    Family History  Problem Relation Age of Onset   Heart attack Father 7   Heart attack Brother 19   Heart attack Brother        had CABG   Breast cancer Neg Hx     Prior to Admission medications   Medication Sig Start Date End Date Taking? Authorizing Provider  allopurinol  (ZYLOPRIM ) 300 MG tablet Take 300 mg by mouth daily. 05/03/24  Yes [provider]  amLODipine  (NORVASC ) 5 MG tablet Take 1 tablet (5 mg total) by mouth daily. 06/08/24  Yes Donnamarie Lebron PARAS, MD  aspirin  EC 81 MG EC tablet Take 1 tablet (81 mg total) by mouth daily. 11/05/17  Yes Rizwan, Saima, MD  carvedilol  (COREG ) 12.5 MG tablet Take 1 tablet (12.5 mg total) by mouth 2 (two) times daily with a meal. 09/16/23  Yes Waddell Danelle ORN, MD  colchicine  0.6 MG tablet Take 0.5 tablets (0.3 mg total) by mouth daily. Patient taking differently: Take 0.6 mg by mouth daily. 06/08/24  Yes Donnamarie Lebron PARAS, MD  cyanocobalamin  (VITAMIN B12) 1000 MCG tablet Take 1,000 mcg by mouth daily.   Yes [provider]  doxycycline (VIBRA-TABS) 100 MG tablet Take 1 tablet (100 mg total) by mouth every 12 (twelve) hours. 06/08/24 07/09/24 Yes Ezenduka, Nkeiruka J, MD  feeding supplement (BOOST HIGH PROTEIN) LIQD Take 237 mLs by mouth 2 (two) times daily between meals.   Yes [provider]  glipiZIDE (GLUCOTROL XL) 2.5 MG 24 hr tablet Take 1 tablet by mouth daily. 09/17/23  Yes [provider]  metFORMIN (GLUCOPHAGE) 500 MG tablet Take 500 mg by mouth 2 (two) times daily with a meal.   Yes [provider]  pantoprazole  (PROTONIX ) 20 MG tablet Take 20 mg by mouth daily. 06/18/23   Yes [provider]  rosuvastatin  (CRESTOR ) 5 MG tablet Take 1 tablet (5 mg total) by mouth daily at 6 PM. 01/19/18  Yes McCue, Harlene, NP  sertraline  (ZOLOFT ) 25 MG tablet Take 25 mg by mouth daily. 05/05/24 05/05/25 Yes [provider]  diltiazem  (CARDIZEM  CD) 120 MG 24 hr capsule TAKE 1 CAPSULE BY MOUTH EVERY DAY 12/02/23   Waddell Danelle ORN, MD  feeding supplement (ENSURE PLUS HIGH PROTEIN) LIQD Take 237 mLs by mouth 2 (two) times daily between meals. Patient not taking: Reported on 06/13/2024 06/08/24   Ezenduka, Nkeiruka J, MD  furosemide  (LASIX ) 40 MG tablet TAKE 2 TABLETS BY MOUTH EVERY DAY 03/31/24   Lee, [place], NP  losartan  (COZAAR ) 25 MG tablet TAKE 1/2 TABLET BY MOUTH DAILY 12/15/23   Donette Ellouise LABOR, FNP  potassium chloride  SA (KLOR-CON  M) 20 MEQ tablet TAKE 2 TABLETS BY MOUTH EVERY DAY Patient taking differently: Take 20 mEq by mouth 2 (two) times daily. 03/31/24   Lee, [place], NP  risperiDONE (RISPERDAL) 0.5 MG tablet TAKE 1 TAB BY MOUTH 2 TIMES DAILY FOR 30 DAYS START 1 IN AFTERNOON AROUND 2 PM AND TITRATE IF NEEDED Patient not taking: Reported on 05/27/2024 05/17/24   [provider]    Physical Exam: Vitals:   06/13/24 1904 06/13/24 1908 06/13/24 2017 06/13/24 2329  BP: (!) 170/63  (!) 145/75 (!) 160/61  Pulse: 71  71 75  Resp: 18  18 20   Temp: 98.8 F (37.1 C)  98.4 F (36.9 C) 98.8 F (37.1 C)  TempSrc: Oral  Oral Oral  SpO2: 96%  99% 93%  Weight:  73 kg    Height:  5' 5 (1.651 m)      Physical Exam Vitals reviewed.  Constitutional:      General: She is not in acute distress. HENT:     Head: Normocephalic and atraumatic.  Eyes:     Extraocular Movements: Extraocular movements intact.  Cardiovascular:     Rate and Rhythm: Normal rate and regular rhythm.     Heart sounds: Normal heart sounds.  Pulmonary:     Effort: Pulmonary effort is normal. No respiratory distress.     Breath sounds: Normal breath sounds.  Abdominal:     General: Bowel  sounds are normal.     Palpations: Abdomen is soft.     Tenderness: There is no abdominal tenderness. There is no guarding.  Musculoskeletal:     Cervical back: Normal range of motion.     Right lower leg: No edema.     Left lower leg: No edema.  Skin:    General: Skin is warm and dry.  Neurological:     General: No focal deficit present.     Mental Status: She is alert and oriented to person, place, and time.     Labs on Admission: I have personally reviewed following labs and imaging studies  CBC: Recent Labs  Lab 06/13/24 1928  WBC 11.4*  NEUTROABS 9.3*  HGB 9.9*  HCT 30.4*  MCV 88.9  PLT 202   Basic Metabolic Panel: Recent Labs  Lab 06/13/24 1928  NA 139  K 3.2*  CL 101  CO2 26  GLUCOSE 98  BUN 22  CREATININE 1.35*  CALCIUM  8.8*   GFR: Estimated Creatinine Clearance: 31.6 mL/min (A) (by C-G formula based on SCr of 1.35 mg/dL (H)). Liver Function Tests: Recent Labs  Lab 06/13/24 1928  AST 34  ALT 20  ALKPHOS 112  BILITOT 1.0  PROT 6.0*  ALBUMIN 3.8   No results for input(s): LIPASE, AMYLASE in the last 168 hours. No results for input(s): AMMONIA in the last 168 hours. Coagulation Profile: No results for input(s): INR, PROTIME in the last 168 hours. Cardiac Enzymes: No results for input(s): CKTOTAL, CKMB, CKMBINDEX, TROPONINI in the last 168 hours. BNP (last 3 results) No results for input(s): PROBNP in the last 8760 hours. HbA1C: No results for input(s): HGBA1C in the last 72 hours. CBG: Recent Labs  Lab 06/13/24 1932 06/13/24 2015 06/13/24 2156 06/14/24 0009  GLUCAP 88 69* 78 133*   Lipid Profile: No results for input(s): CHOL, HDL, LDLCALC, TRIG, CHOLHDL, LDLDIRECT in the last 72 hours. Thyroid  Function Tests: No results for input(s): TSH, T4TOTAL, FREET4, T3FREE, THYROIDAB in the last 72 hours. Anemia Panel: No results for input(s): VITAMINB12, FOLATE, FERRITIN, TIBC, IRON,  RETICCTPCT in the last 72 hours. Urine analysis:    Component Value Date/Time   COLORURINE YELLOW 06/13/2024 1936   APPEARANCEUR CLEAR 06/13/2024 1936  LABSPEC 1.013 06/13/2024 1936   PHURINE 5.0 06/13/2024 1936   GLUCOSEU NEGATIVE 06/13/2024 1936   HGBUR NEGATIVE 06/13/2024 1936   BILIRUBINUR NEGATIVE 06/13/2024 1936   BILIRUBINUR negative 12/22/2023 1716   KETONESUR NEGATIVE 06/13/2024 1936   PROTEINUR NEGATIVE 06/13/2024 1936   UROBILINOGEN 1.0 12/22/2023 1716   NITRITE NEGATIVE 06/13/2024 1936   LEUKOCYTESUR NEGATIVE 06/13/2024 1936    Radiological Exams on Admission: CT ABDOMEN PELVIS W CONTRAST Result Date: 06/13/2024 CLINICAL DATA:  Abdominal pain EXAM: CT ABDOMEN AND PELVIS WITH CONTRAST TECHNIQUE: Multidetector CT imaging of the abdomen and pelvis was performed using the standard protocol following bolus administration of intravenous contrast. RADIATION DOSE REDUCTION: This exam was performed according to the departmental dose-optimization program which includes automated exposure control, adjustment of the mA and/or kV according to patient size and/or use of iterative reconstruction technique. CONTRAST:  80mL OMNIPAQUE  IOHEXOL  300 MG/ML  SOLN COMPARISON:  04/21/2024 FINDINGS: Lower chest: No acute abnormality. Hepatobiliary: No focal liver abnormality is seen. Status post cholecystectomy. No biliary dilatation. Pancreas: No focal abnormality or ductal dilatation. Spleen: No focal abnormality.  Normal size. Adrenals/Urinary Tract: No adrenal abnormality. No focal renal abnormality. No stones or hydronephrosis. Urinary bladder is unremarkable. Stomach/Bowel: Normal appendix. Stomach, large and small bowel grossly unremarkable. Vascular/Lymphatic: Aortic atherosclerosis. No evidence of aneurysm or adenopathy. Reproductive: Prior hysterectomy.  No adnexal masses. Other: No free fluid or free air. Musculoskeletal: No acute bony abnormality. IMPRESSION: No acute findings in the abdomen  or pelvis. Aortic atherosclerosis. Electronically Signed   By: Franky Crease M.D.   On: 06/13/2024 21:35    Assessment and Plan  Hypoglycemia Type 2 diabetes on oral hypoglycemic agents Patient is on metformin and glipizide.  Hemoglobin A1c 5.4 on 05/28/2024.  She is presenting with severe hypoglycemia in the setting of progressively worsening dementia and very poor p.o. intake.  Glucose as low as 30s at her nursing facility and was given glucose gel and 250 mL of D10 by EMS.  Remained hypoglycemic in the ED requiring D10 drip.  Patient drank 4 ounces of juice and had 2 small bites of a sandwich in the ED.  Most recent CBG 133.  Continue D10 drip and monitor CBGs every 4 hours.  Hold metformin at this time and glipizide has been discontinued.  Encourage p.o. intake/regular diet ordered.  Continue feeding supplement twice daily.  Recent history of pyogenic flexor tenosynovitis due to MSSA status post I&D and partial amputation of left index finger Continue doxycycline through 07/09/2024 as previously recommended by infectious disease.  Mild leukocytosis Afebrile.  No infectious source identified on UA and CT abdomen pelvis.  Lungs clear on exam and no respiratory symptoms reported to suggest pneumonia.  Repeat CBC in the morning.  Mild hypokalemia Monitor potassium and magnesium  levels, replace as needed.  History of CVA Continue aspirin  and statin.  Advanced dementia Mood disorder Continue Zoloft .  Delirium precautions.  Hypertension Continue amlodipine  and Coreg .  Hyperlipidemia Continue Crestor .  Gout Continue allopurinol .  Uncontrolled GERD Increase dose of Protonix  from 20 mg daily to 40 mg daily.  Chronic HFpEF Last echo done in November 2023 showing EF 50 to 55%, grade 1 diastolic dysfunction, and mild aortic regurgitation.  No signs of volume overload at this time.  Chronic anemia Hemoglobin stable, monitor labs.  CKD stage IIIb Creatinine improved and stable, monitor  labs.  DVT prophylaxis: Lovenox Code Status: DNR/DNI  Family Communication: No family available at this time. Level of care: Progressive Care Unit Admission status:  It is my clinical opinion that referral for OBSERVATION is reasonable and necessary in this patient based on the above information provided. The aforementioned taken together are felt to place the patient at high risk for further clinical deterioration. However, it is anticipated that the patient may be medically stable for discharge from the hospital within 24 to 48 hours.  Editha Ram MD [facility]  If 7PM-7AM, please contact night-coverage [email or web address]  06/14/2024, 12:14 AM

## 2024-06-14 NOTE — Care Management Obs Status (Signed)
 MEDICARE OBSERVATION STATUS NOTIFICATION   Patient Details  Name: Yvonne Newton MRN: [id number] Date of Birth: 1941/07/18   Medicare Observation Status Notification Given:  Yes    MahabirNathanel, RN 06/14/2024, 12:00 PM

## 2024-06-14 NOTE — Evaluation (Signed)
 Physical Therapy Evaluation Patient Details Name: Yvonne Newton MRN: [id number] DOB: [birth year] Today's Date: 06/14/2024  History of Present Illness  83 yo female admitted from SNF with hypoglycemia. Hx of gout, anemia, orthostatic hypotension, dementia, falls, 9/27 I&D, L index finger partial amputation, DM  Clinical Impression  On eval, pt required Mod A for mobility. Pt was lethargic, able to follow 1 step commands inconsistently, during session. Min-CGA for static sitting balance at EOB. Mod A to briefly stand at EOB-pt unable to maintain WBing-flexed knees, legs giving way. Pt presents with general weakness, decreased activity tolerance, and impaired gait/balance. Pt is at risk for falls when mobilizing. No family present during session. Patient will benefit from continued inpatient follow up therapy, <3 hours/day.        If plan is discharge home, recommend the following: Assistance with cooking/housework;Help with stairs or ramp for entrance;Supervision due to cognitive status;Assist for transportation;Two people to help with walking and/or transfers;A lot of help with bathing/dressing/bathroom   Can travel by private vehicle        Equipment Recommendations  (TBD at next venue)  Recommendations for Other Services       Functional Status Assessment Patient has had a recent decline in their functional status and demonstrates the ability to make significant improvements in function in a reasonable and predictable amount of time.     Precautions / Restrictions Precautions Precautions: Fall Recall of Precautions/Restrictions: Impaired Restrictions Weight Bearing Restrictions Per Provider Order: Yes LUE Weight Bearing Per Provider Order: Non weight bearing      Mobility  Bed Mobility Overal bed mobility: Needs Assistance Bed Mobility: Supine to Sit, Sit to Supine     Supine to sit: Mod assist, HOB elevated, Used rails Sit to supine: Mod assist, HOB elevated, Used  rails   General bed mobility comments: Assist for trunk and bil LEs. Increased time. Repeated multimodal cueing required. Once EOB, pt was Min-CGA for static sitting balance. Eaily drifts off to sleep/closes eyes.    Transfers Overall transfer level: Needs assistance   Transfers: Sit to/from Stand Sit to Stand: Mod assist           General transfer comment: Stood x 1 from bed with Mod A (2 attempts required)-pt unable to maintain WBing-knees flexed, legs giving way. Deferred transfer for safety reasons/fall risk.    Ambulation/Gait                  Stairs            Wheelchair Mobility     Tilt Bed    Modified Rankin (Stroke Patients Only)       Balance Overall balance assessment: Needs assistance Sitting-balance support: Feet supported, Single extremity supported   Sitting balance - Comments: posterior LOB. poor initially but progressed to fair with time and cues. Postural control: Posterior lean   Standing balance-Leahy Scale: Zero                               Pertinent Vitals/Pain Pain Assessment Pain Assessment: Faces Faces Pain Scale: Hurts a little bit Pain Location: L hand Pain Descriptors / Indicators: Discomfort Pain Intervention(s): Monitored during session, Repositioned    Home Living Family/patient expects to be discharged to:: Skilled nursing facility Living Arrangements: Children Available Help at Discharge: Family;Available PRN/intermittently Type of Home: House Home Access: Stairs to enter;Ramped entrance Entrance Stairs-Rails: Right;Left Entrance Stairs-Number of Steps: 2   Home Layout: One  level Home Equipment: Agricultural consultant (2 wheels)      Prior Function Prior Level of Function : Needs assist             Mobility Comments: at snf for rehab-was ambulatory with RW during last admission. (prior to last admit--ambulates without AD. At least 3 falls in the past 6 mo. Family provides sup for safety. Family  reports they have tried to get her to use RW, but she won't) ADLs Comments: at snf for rehab (prior to last admit-family reports on a good day pt is able to complete ADLs herself with modI to supervision and on a bad day needs assistance. Family manages medications, household chores, and transportation.     Extremity/Trunk Assessment   Upper Extremity Assessment Upper Extremity Assessment: Defer to OT evaluation LUE Deficits / Details: partial amputation, wrapped with ace, cues and assist needed to maintain NWB    Lower Extremity Assessment Lower Extremity Assessment: Generalized weakness    Cervical / Trunk Assessment Cervical / Trunk Assessment: Kyphotic  Communication   Communication Factors Affecting Communication: Difficulty expressing self (speech a bit slurred)    Cognition Arousal: Lethargic Behavior During Therapy: Flat affect   PT - Cognitive impairments: History of cognitive impairments                       PT - Cognition Comments: very drowsy but able to follow 1 step commands inconsistently   Following commands impaired: Only follows one step commands consistently     Cueing Cueing Techniques: Verbal cues, Gestural cues, Tactile cues, Visual cues     General Comments      Exercises     Assessment/Plan    PT Assessment Patient needs continued PT services  PT Problem List Decreased balance;Decreased mobility;Decreased cognition;Decreased knowledge of use of DME;Decreased safety awareness;Decreased knowledge of precautions;Decreased activity tolerance;Decreased strength       PT Treatment Interventions DME instruction;Gait training;Functional mobility training;Therapeutic activities;Therapeutic exercise;Balance training;Cognitive remediation;Patient/family education    PT Goals (Current goals can be found in the Care Plan section)  Acute Rehab PT Goals PT Goal Formulation: Patient unable to participate in goal setting Time For Goal Achievement:  06/28/24 Potential to Achieve Goals: Fair    Frequency Min 2X/week     Co-evaluation               AM-PAC PT 6 Clicks Mobility  Outcome Measure Help needed turning from your back to your side while in a flat bed without using bedrails?: A Lot Help needed moving from lying on your back to sitting on the side of a flat bed without using bedrails?: A Lot Help needed moving to and from a bed to a chair (including a wheelchair)?: A Lot Help needed standing up from a chair using your arms (e.g., wheelchair or bedside chair)?: A Lot Help needed to walk in hospital room?: Total Help needed climbing 3-5 steps with a railing? : Total 6 Click Score: 10    End of Session   Activity Tolerance: Patient tolerated treatment well;Patient limited by lethargy Patient left: in bed;with call bell/phone within reach;with bed alarm set   PT Visit Diagnosis: Muscle weakness (generalized) (M62.81);Difficulty in walking, not elsewhere classified (R26.2);History of falling (Z91.81);Other abnormalities of gait and mobility (R26.89)    Time: 8847-8787 PT Time Calculation (min) (ACUTE ONLY): 20 min   Charges:   PT Evaluation $PT Eval Low Complexity: 1 Low   PT General Charges $$ ACUTE PT VISIT: 1  Visit            Dannial SQUIBB, PT Acute Rehabilitation  Office: [phone]

## 2024-06-14 NOTE — Plan of Care (Signed)

## 2024-06-14 NOTE — Progress Notes (Signed)
 I have seen and assessed patient and agree with Dr. Lennie assessment and plan. Patient is a 83 year old female history of CVA, HFpEF, dementia, type 2 diabetes on metformin and glipizide, CKD stage IIIb, hypertension, hyperlipidemia, gout, osteoarthritis, chronic anemia, GERD recently hospitalized 9 25 through 10 8 for fall/syncope and collapse, progressive worsening dementia with decreased oral intake more somnolent and having hallucinations.  Patient during prior hospitalization noted to have AKI, pyogenic flexor tenosynovitis due to MSSA, and underwent I&D and partial amputation of left index finger on 9/27 treated IV daptomycin and outpatient doxycycline recommended until 07/09/2024.  Patient presenting to ED from SNF due to hypoglycemic spells with glucose in the 30s.  Assessment/plan 1.  Hypoglycemia/type 2 diabetes on oral hypoglycemic agents -Secondary to poor oral intake in the setting of metformin and glipizide. -Patient noted to have a hemoglobin A1c of 5.4 on 05/28/2024. -Patient noted to have presented with severe hypoglycemia in the setting of progressive worsening dementia and poor oral intake. -CBG noted at facility received glucose gel and D10 by EMS however remained hypoglycemic and placed on D10. -CBGs improving and starting to trend up, noted at 153 this morning and at 236 this afternoon. -Discontinue D10 and placed on D5 normal saline and monitor CBGs every 4 hours. -Continue to hold metformin and glipizide and likely will not resume on discharge. -Supportive care.  2.  Recent history of pyogenic flexor tenosynovitis due to MSSA status post I&D and partial amputation of left index finger -Continue home regimen doxycycline through 07/09/2024 as previously recommended during last hospitalization by ID.  3.  Mild leukocytosis -Likely reactive. -Patient with no signs of infection. -Urinalysis bland. -Patient afebrile. -Monitor off antibiotics.  4.   Hypokalemia/hypomagnesemia -Potassium at 3.3, magnesium  of 1.4. -K. Dur 40 mEq p.o. x 1. -Magnesium  sulfate 4 g IV x 1. -Repeat labs in the AM.  5.  History of CVA -Continue aspirin  and statin for secondary stroke prophylaxis. -PT/OT.  6.  Advanced dementia/mood disorder -Continue Zoloft , delirium precautions. - Resume home regimen Risperdal.  7.  Hyperlipidemia -Continue statin.  8.  Hypertension -Continue Norvasc , Coreg .  9.  Gout -Allopurinol .  10.  Uncontrolled GERD -Protonix  increased to 40 mg daily.  11.  Chronic HFpEF -Last 2D echo November 2023 with a EF of 50 to 55%, grade 1 DD, mild aortic regurgitation. -Patient looks more on the dry side.  12.  Chronic anemia -Hemoglobin currently stable at 9.4. -Follow H&H. -Transfusion threshold hemoglobin < 8.  13.  CKD stage IIIb -Stable.  No charge.

## 2024-06-14 NOTE — TOC Initial Note (Signed)
 Transition of Care [facility]) - Initial/Assessment Note    Patient Details  Name: Yvonne Newton MRN: [id number] Date of Birth: 03-28-1941  Transition of Care [facility]) CM/SW Contact:    Bascom Service, RN Phone Number: 06/14/2024, 12:08 PM  Clinical Narrative:  Patient w/dementia. Spoke to [employer]) about d/c plans-from [facility] rep Darrian aware-for return-noted from prior d/c summary-[facility] care otpt Palliative care services-DNR, MOST form in place.Await PT recc.                  Expected Discharge Plan: Skilled Nursing Facility Barriers to Discharge: Continued Medical Work up   Patient Goals and CMS Choice Patient states their goals for this hospitalization and ongoing recovery are:: Return back to [facility] CMS Medicare.gov Compare Post Acute Care list provided to:: Patient Represenative (must comment) (Tony(Son)) Choice offered to / list presented to : Adult Children [place] ownership interest in [facility].provided to:: Adult Children    Expected Discharge Plan and Services   Discharge Planning Services: CM Consult Post Acute Care Choice: Skilled Nursing Facility                                        Prior Living Arrangements/Services   Lives with:: Facility Resident                   Activities of Daily Living   ADL Screening (condition at time of admission) Independently performs ADLs?: No Does the patient have a NEW difficulty with bathing/dressing/toileting/self-feeding that is expected to last >3 days?: No Does the patient have a NEW difficulty with getting in/out of bed, walking, or climbing stairs that is expected to last >3 days?: No Does the patient have a NEW difficulty with communication that is expected to last >3 days?: No Is the patient deaf or have difficulty hearing?: No Does the patient have difficulty seeing, even when wearing glasses/contacts?: No Does the patient have difficulty concentrating, remembering, or  making decisions?: Yes  Permission Sought/Granted                  Emotional Assessment              Admission diagnosis:  Anorexia [R63.0] Hypoglycemia [E16.2] Failure to thrive in adult [R62.7] Patient Active Problem List   Diagnosis Date Noted   Hypoglycemia 06/14/2024   Hypokalemia 06/14/2024   Suppurative tenosynovitis of flexor tendon of left hand 05/28/2024   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) 05/28/2024   Infected finger 05/27/2024   Acute kidney injury superimposed on chronic kidney disease 05/27/2024   Hyperkalemia 05/27/2024   Controlled type 2 diabetes mellitus with hypoglycemia, without long-term current use of insulin  (HCC) 05/27/2024   Acute metabolic encephalopathy 05/27/2024   Fall at home, initial encounter 05/27/2024   Metabolic acidosis 05/27/2024   Lactic acidosis 05/27/2024   Normocytic anemia 05/27/2024   Gout 05/27/2024   GERD (gastroesophageal reflux disease) 05/27/2024   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   Coronary artery disease involving native coronary artery of native heart with unstable angina pectoris (HCC) 07/12/2022   NSTEMI (non-ST elevated myocardial infarction) (HCC) 07/11/2022   History of cerebellar stroke 2019 07/11/2022   History of ventricular tachycardia 07/11/2022   Chest pain 07/11/2022   Dementia without behavioral disturbance (HCC) 07/11/2022   Acute respiratory failure with hypoxia (HCC) 07/11/2022   Acute on chronic diastolic CHF (congestive heart failure) (  HCC) 07/11/2022   Cerebellar stroke (HCC)    Dysarthria    Diabetes mellitus type 2 in nonobese [facility])    Allergy 07/22/2017   Arthritis 07/22/2017   Type 2 diabetes mellitus (HCC) 07/22/2017   Hyperlipidemia, unspecified 07/22/2017   Essential hypertension 07/22/2017   VT (ventricular tachycardia) (HCC) 07/22/2017   OBESITY 09/05/2009   VENTRICULAR TACHYCARDIA 09/05/2009   PCP:  [facility]:   [facility] [phone] -  [address] [place] - [address] [name] [address] Phone: [phone] Fax: [phone]     Social Drivers of Health (SDOH) Social History: SDOH Screenings   Food Insecurity: Patient Declined (06/14/2024)  Housing: Unknown (06/14/2024)  Transportation Needs: Patient Declined (06/14/2024)  Utilities: Patient Declined (06/14/2024)  Financial Resource Strain: Low Risk  (05/03/2024)   Received from [facility]  Social Connections: Unknown (06/14/2024)  Tobacco Use: Medium Risk (06/13/2024)   SDOH Interventions:     Readmission Risk Interventions     No data to display

## 2024-06-15 DIAGNOSIS — Z7984 Long term (current) use of oral hypoglycemic drugs: Secondary | ICD-10-CM | POA: Diagnosis not present

## 2024-06-15 DIAGNOSIS — E162 Hypoglycemia, unspecified: Secondary | ICD-10-CM | POA: Diagnosis not present

## 2024-06-15 DIAGNOSIS — E669 Obesity, unspecified: Secondary | ICD-10-CM | POA: Diagnosis not present

## 2024-06-15 DIAGNOSIS — M109 Gout, unspecified: Secondary | ICD-10-CM | POA: Diagnosis not present

## 2024-06-15 DIAGNOSIS — F0393 Unspecified dementia, unspecified severity, with mood disturbance: Secondary | ICD-10-CM | POA: Diagnosis not present

## 2024-06-15 DIAGNOSIS — E785 Hyperlipidemia, unspecified: Secondary | ICD-10-CM | POA: Diagnosis not present

## 2024-06-15 DIAGNOSIS — E1122 Type 2 diabetes mellitus with diabetic chronic kidney disease: Secondary | ICD-10-CM | POA: Diagnosis not present

## 2024-06-15 DIAGNOSIS — I130 Hypertensive heart and chronic kidney disease with heart failure and stage 1 through stage 4 chronic kidney disease, or unspecified chronic kidney disease: Secondary | ICD-10-CM | POA: Diagnosis not present

## 2024-06-15 DIAGNOSIS — D72829 Elevated white blood cell count, unspecified: Secondary | ICD-10-CM | POA: Diagnosis not present

## 2024-06-15 DIAGNOSIS — R52 Pain, unspecified: Secondary | ICD-10-CM | POA: Diagnosis not present

## 2024-06-15 DIAGNOSIS — Z7982 Long term (current) use of aspirin: Secondary | ICD-10-CM | POA: Diagnosis not present

## 2024-06-15 DIAGNOSIS — Z515 Encounter for palliative care: Secondary | ICD-10-CM | POA: Diagnosis not present

## 2024-06-15 DIAGNOSIS — Z79899 Other long term (current) drug therapy: Secondary | ICD-10-CM | POA: Diagnosis not present

## 2024-06-15 DIAGNOSIS — R630 Anorexia: Secondary | ICD-10-CM | POA: Diagnosis present

## 2024-06-15 DIAGNOSIS — R451 Restlessness and agitation: Secondary | ICD-10-CM | POA: Diagnosis not present

## 2024-06-15 DIAGNOSIS — K219 Gastro-esophageal reflux disease without esophagitis: Secondary | ICD-10-CM | POA: Diagnosis not present

## 2024-06-15 DIAGNOSIS — F0392 Unspecified dementia, unspecified severity, with psychotic disturbance: Secondary | ICD-10-CM | POA: Diagnosis not present

## 2024-06-15 DIAGNOSIS — Z711 Person with feared health complaint in whom no diagnosis is made: Secondary | ICD-10-CM | POA: Diagnosis not present

## 2024-06-15 DIAGNOSIS — Z66 Do not resuscitate: Secondary | ICD-10-CM | POA: Diagnosis not present

## 2024-06-15 DIAGNOSIS — N1832 Chronic kidney disease, stage 3b: Secondary | ICD-10-CM | POA: Diagnosis not present

## 2024-06-15 DIAGNOSIS — I5032 Chronic diastolic (congestive) heart failure: Secondary | ICD-10-CM | POA: Diagnosis not present

## 2024-06-15 DIAGNOSIS — Z8249 Family history of ischemic heart disease and other diseases of the circulatory system: Secondary | ICD-10-CM | POA: Diagnosis not present

## 2024-06-15 DIAGNOSIS — D649 Anemia, unspecified: Secondary | ICD-10-CM | POA: Diagnosis not present

## 2024-06-15 DIAGNOSIS — E876 Hypokalemia: Secondary | ICD-10-CM | POA: Diagnosis not present

## 2024-06-15 DIAGNOSIS — F03918 Unspecified dementia, unspecified severity, with other behavioral disturbance: Secondary | ICD-10-CM | POA: Diagnosis not present

## 2024-06-15 DIAGNOSIS — E11649 Type 2 diabetes mellitus with hypoglycemia without coma: Secondary | ICD-10-CM | POA: Diagnosis not present

## 2024-06-15 DIAGNOSIS — R627 Adult failure to thrive: Secondary | ICD-10-CM | POA: Diagnosis not present

## 2024-06-15 DIAGNOSIS — G9341 Metabolic encephalopathy: Secondary | ICD-10-CM | POA: Diagnosis not present

## 2024-06-15 LAB — CBC
HCT: 30.3 % — ABNORMAL LOW (ref 36.0–46.0)
Hemoglobin: 9.7 g/dL — ABNORMAL LOW (ref 12.0–15.0)
MCH: 29.8 pg (ref 26.0–34.0)
MCHC: 32 g/dL (ref 30.0–36.0)
MCV: 92.9 fL (ref 80.0–100.0)
Platelets: 170 K/uL (ref 150–400)
RBC: 3.26 MIL/uL — ABNORMAL LOW (ref 3.87–5.11)
RDW: 16.8 % — ABNORMAL HIGH (ref 11.5–15.5)
WBC: 12.3 K/uL — ABNORMAL HIGH (ref 4.0–10.5)
nRBC: 0 % (ref 0.0–0.2)

## 2024-06-15 LAB — RENAL FUNCTION PANEL
Albumin: 3.6 g/dL (ref 3.5–5.0)
Anion gap: 12 (ref 5–15)
BUN: 14 mg/dL (ref 8–23)
CO2: 23 mmol/L (ref 22–32)
Calcium: 8.9 mg/dL (ref 8.9–10.3)
Chloride: 103 mmol/L (ref 98–111)
Creatinine, Ser: 1.03 mg/dL — ABNORMAL HIGH (ref 0.44–1.00)
GFR, Estimated: 54 mL/min — ABNORMAL LOW (ref >60)
Glucose, Bld: 131 mg/dL — ABNORMAL HIGH (ref 70–99)
Phosphorus: 2.4 mg/dL — ABNORMAL LOW (ref 2.5–4.6)
Potassium: 3.9 mmol/L (ref 3.5–5.1)
Sodium: 138 mmol/L (ref 135–145)

## 2024-06-15 LAB — HEPATIC FUNCTION PANEL
ALT: 18 U/L (ref 0–44)
AST: 28 U/L (ref 15–41)
Albumin: 3.3 g/dL — ABNORMAL LOW (ref 3.5–5.0)
Alkaline Phosphatase: 109 U/L (ref 38–126)
Bilirubin, Direct: 0.5 mg/dL — ABNORMAL HIGH (ref 0.0–0.2)
Indirect Bilirubin: 0.6 mg/dL (ref 0.3–0.9)
Total Bilirubin: 1.1 mg/dL (ref 0.0–1.2)
Total Protein: 5.3 g/dL — ABNORMAL LOW (ref 6.5–8.1)

## 2024-06-15 LAB — GLUCOSE, CAPILLARY
Glucose-Capillary: 107 mg/dL — ABNORMAL HIGH (ref 70–99)
Glucose-Capillary: 125 mg/dL — ABNORMAL HIGH (ref 70–99)
Glucose-Capillary: 131 mg/dL — ABNORMAL HIGH (ref 70–99)
Glucose-Capillary: 146 mg/dL — ABNORMAL HIGH (ref 70–99)
Glucose-Capillary: 152 mg/dL — ABNORMAL HIGH (ref 70–99)
Glucose-Capillary: 164 mg/dL — ABNORMAL HIGH (ref 70–99)

## 2024-06-15 LAB — MAGNESIUM: Magnesium: 2.1 mg/dL (ref 1.7–2.4)

## 2024-06-15 LAB — AMMONIA: Ammonia: 14 umol/L (ref 9–35)

## 2024-06-15 MED ORDER — POTASSIUM PHOSPHATES 15 MMOLE/5ML IV SOLN
15.0000 mmol | Freq: Once | INTRAVENOUS | Status: AC
  Administered 2024-06-15: 15 mmol via INTRAVENOUS
  Filled 2024-06-15: qty 5

## 2024-06-15 MED ORDER — SODIUM CHLORIDE 0.9 % IV SOLN
100.0000 mg | Freq: Two times a day (BID) | INTRAVENOUS | Status: DC
  Administered 2024-06-15 – 2024-06-22 (×16): 100 mg via INTRAVENOUS
  Filled 2024-06-15 (×19): qty 100

## 2024-06-15 MED ORDER — K PHOS MONO-SOD PHOS DI & MONO 155-852-130 MG PO TABS: 500.0000 mg | ORAL_TABLET | ORAL | Status: DC

## 2024-06-15 NOTE — Progress Notes (Signed)
 Assuming care for patient from off-going RN until 1900. Agree w/ previously charted shift assessment and daily documentation.

## 2024-06-15 NOTE — Evaluation (Signed)
 Clinical/Bedside Swallow Evaluation Patient Details  Name: Yvonne Newton MRN: [id number] Date of Birth: Jan 12, 1941  Today's Date: 06/15/2024 Time: SLP Start Time (ACUTE ONLY): 9078 SLP Stop Time (ACUTE ONLY): 0942 SLP Time Calculation (min) (ACUTE ONLY): 21 min  Past Medical History:  Past Medical History:  Diagnosis Date   Arthritis 07/22/2017   Cerebellar stroke (HCC)    CHF (congestive heart failure) (HCC)    Dementia (HCC)    Diabetes mellitus type 2 in nonobese (HCC)    DM (diabetes mellitus) (HCC)    Dysarthria    Essential hypertension 07/22/2017   Hyperlipidemia, unspecified 07/22/2017   OA (osteoarthritis) of knee    right   Obesity    Stroke (HCC)    Tachycardia 07/22/2017   VENTRICULAR TACHYCARDIA 09/05/2009   Qualifier: Diagnosis of  By: Lawernce CMA, Jewel     Ventricular tachycardia (HCC)    Vitamin B 12 deficiency    Past Surgical History:  Past Surgical History:  Procedure Laterality Date   AMPUTATION FINGER Left 05/28/2024   Procedure: AMPUTATION, FINGER;  Surgeon: Delene Marsa HERO, MD;  Location: [facility] OR;  Service: Orthopedics;  Laterality: Left;  PARTIAL   BREAST BIOPSY Left 2010   Negative   CORONARY PRESSURE/FFR STUDY N/A 07/14/2022   Procedure: INTRAVASCULAR PRESSURE WIRE/FFR STUDY;  Surgeon: Darron Deatrice LABOR, MD;  Location: [facility] INVASIVE CV LAB;  Service: Cardiovascular;  Laterality: N/A;   INCISION AND DRAINAGE OF WOUND Left 05/28/2024   Procedure: IRRIGATION AND DEBRIDEMENT WOUND;  Surgeon: Delene Marsa HERO, MD;  Location: [facility] OR;  Service: Orthopedics;  Laterality: Left;  LEFT INDEX FINGER and HAND   LEFT HEART CATH AND CORONARY ANGIOGRAPHY N/A 07/14/2022   Procedure: LEFT HEART CATH AND CORONARY ANGIOGRAPHY;  Surgeon: Darron Deatrice LABOR, MD;  Location: [facility] INVASIVE CV LAB;  Service: Cardiovascular;  Laterality: N/A;   LOOP RECORDER INSERTION N/A 11/03/2017   Procedure: LOOP RECORDER INSERTION;  Surgeon: Inocencio Soyla Lunger, MD;   Location: [facility] INVASIVE CV LAB;  Service: Cardiovascular;  Laterality: N/A;   TEE WITHOUT CARDIOVERSION N/A 11/03/2017   Procedure: TRANSESOPHAGEAL ECHOCARDIOGRAM (TEE);  Surgeon: Maranda Leim DEL, MD;  Location: [facility] ENDOSCOPY;  Service: Cardiovascular;  Laterality: N/A;   HPI:  Patient is an 83 year old female who was in SNF short term rehab s/p hospitalization for AKI on CKD and I & D with 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  Patient presented to the ED when glucose was found to be in the 30s at facility.  Dx with severe hypoglycemia.    Assessment / Plan / Recommendation  Clinical Impression   Pt presents with a mild-moderate oral dysphagia and concerns for a pharyngeal dysphagia. Pt's lethargy likely is the main contributing factor to her dysphagia symptoms. Per daughter and husband, pt has had poor PO intake for the past few weeks. She exhibited coughing previous evening with intake of liquids; no prior concerns for dysphagia.   Today, pt is very lethargic though does verbally respond to touch and voice. Her responses are mumbled and nonsensical. She did accept limited trials of thin water by straw, chocolate ice cream, and applesauce.   Oral deficits included reduced oral and lingual coordination for both straw use and extraction of ice cream and applesauce from spoon. Forward lingual propulsion noted during attempted stripping of PO items from spoon which limited the amount she took from spoon. Some inconsistent improvement with straw use observed when SLP initially used straw as a pipette.  Pharyngeal swallow initiation appeared delayed to observation with laryngeal movement observed. No overt or subtle s/s of aspiration observed across trials, though silent aspiration can not be ruled out at bedside.   SLP discussed diet modification options and pros/cons to modifications. If diet were to be downgraded to puree, items may be easier for pt to eat, though the  texture and taste may detur pt from eating.   Family agreeable to diet modification to mechanical altered diet (dysphagia 2) with continued thin liquids and PO meds crushed. It is doubtful that pt will have adequate PO intake. Recommend Palliative Care consult to discuss goals of care. Family reported pt does not want a feeding tube.   Plan: SLP will follow up to assess diet tolerance and offer further education on careful hand feeding strategies as needed or based on pt's goals of care.   SLP Visit Diagnosis: Dysphagia, oral phase (R13.11)    Aspiration Risk  Mild aspiration risk;Moderate aspiration risk;Risk for inadequate nutrition/hydration    Diet Recommendation Dysphagia 2 (Fine chop);Thin liquid    Liquid Administration via: Cup;Spoon;Straw Medication Administration: Crushed with puree Supervision: Full supervision/cueing for compensatory strategies;Staff to assist with self feeding (family can assist with feeding) Compensations: Slow rate;Small sips/bites Postural Changes: Seated upright at 90 degrees    Other  Recommendations Recommended Consults:  (Palliative Care Consult) Oral Care Recommendations: Oral care QID (as pt will permit)     Assistance Recommended at Discharge  24/7 supervision/assistance   Functional Status Assessment Patient has had a recent decline in their functional status and/or demonstrates limited ability to make significant improvements in function in a reasonable and predictable amount of time  Frequency and Duration min 1 x/week  1 week       Prognosis Prognosis for improved oropharyngeal function: Guarded Barriers to Reach Goals: Cognitive deficits;Severity of deficits Barriers/Prognosis Comment: failure to thrive      Swallow Study   General Date of Onset: 06/15/24 HPI: Patient is an 83 year old female who was in SNF short term rehab s/p hospitalization for AKI on CKD and I & D with partial amputation of left index finger secondary to MSSA  infection.  Patient presented to the ED when glucose was found to be in the 30s at facility.  Dx with severe hypoglycemia. Type of Study: Bedside Swallow Evaluation Previous Swallow Assessment: none per chart; family reported that RN noted some coughing with liquids last night; otherwise, no hx of dysphagia Diet Prior to this Study: Dysphagia 3 (mechanical soft);Thin liquids (Level 0) Temperature Spikes Noted: No Respiratory Status: Room air History of Recent Intubation: No Behavior/Cognition: Alert;Cooperative Oral Cavity Assessment: Within Functional Limits (limited assessment) Oral Cavity - Dentition: Edentulous (pt has dentures but does not wear them) Vision: Impaired for self-feeding Self-Feeding Abilities: Total assist Patient Positioning: Upright in bed Baseline Vocal Quality: Normal (limited assessment; though voice appeared clear) Volitional Cough: Cognitively unable to elicit Volitional Swallow: Unable to elicit    Oral/Motor/Sensory Function Overall Oral Motor/Sensory Function:  (unable to assess; appeard to have reduced lingual coordination with PO trials)   Ice Chips Ice chips: Not tested (attempted but pt did not accept)   Thin Liquid Thin Liquid: Impaired Presentation: Straw;Spoon Oral Phase Impairments: Reduced labial seal;Reduced lingual movement/coordination;Poor awareness of bolus (reduced coordination for efficient straw use) Pharyngeal  Phase Impairments: Multiple swallows;Suspected delayed Swallow    Nectar Thick Nectar Thick Liquid: Not tested   Honey Thick Honey Thick Liquid: Not tested   Puree Puree: Impaired Presentation: Spoon  Oral Phase Impairments: Reduced labial seal;Poor awareness of bolus;Reduced lingual movement/coordination Oral Phase Functional Implications: Oral holding;Prolonged oral transit Pharyngeal Phase Impairments: Suspected delayed Swallow   Solid     Solid: Not tested      Peyton JINNY Rummer 06/15/2024,10:19 AM

## 2024-06-15 NOTE — TOC Progression Note (Signed)
 Transition of Care [facility]) - Progression Note    Patient Details  Name: Yvonne Newton MRN: [id number] Date of Birth: [birth year]  Transition of Care [facility]) CM/SW Contact  Jalani Rominger, Nathanel, RN Phone Number: 06/15/2024, 2:26 PM  Clinical Narrative: [facility]-rep Darrian aware. [facility] care rep Melissa following -received hospice referral. Palliative care cons-await recc.    Expected Discharge Plan:  (TBD) Barriers to Discharge: Continued Medical Work up               Expected Discharge Plan and Services   Discharge Planning Services: CM Consult Post Acute Care Choice: Skilled Nursing Facility                                         Social Drivers of Health (SDOH) Interventions SDOH Screenings   Food Insecurity: Patient Declined (06/14/2024)  Housing: Unknown (06/14/2024)  Transportation Needs: Patient Declined (06/14/2024)  Utilities: Patient Declined (06/14/2024)  Financial Resource Strain: Low Risk  (05/03/2024)   Received from [facility]  Social Connections: Unknown (06/14/2024)  Tobacco Use: Medium Risk (06/13/2024)    Readmission Risk Interventions     No data to display

## 2024-06-15 NOTE — Progress Notes (Signed)
 Mobility Specialist - Progress Note   06/15/24 0837  Mobility  Activity Dangled on edge of bed  Level of Assistance +2 (takes two people)  Range of Motion/Exercises Active Assistive  Activity Response Tolerated fair  Mobility visit 1 Mobility  Mobility Specialist Start Time (ACUTE ONLY) 0800  Mobility Specialist Stop Time (ACUTE ONLY) 0837  Mobility Specialist Time Calculation (min) (ACUTE ONLY) 37 min   Pt was found in bed and agreeable to mobilize. NT in room and assisted during session. Able to sit EOB but unable to further mobilization. Found to be saturated and assisted with pericare. At EOS was left in bed with all needs met. Call bell in reach and granddaughter in room.   Erminio Leos,  Mobility Specialist Can be reached via Secure Chat

## 2024-06-15 NOTE — Progress Notes (Signed)
 PROGRESS NOTE    Yvonne Newton  FMW:[id number] DOB: 12-Feb-1941 DOA: 06/13/2024 PCP: [facility]   Brief Narrative: 83 year old with past medical history significant for CVA, heart failure preserved ejection fraction, dementia, diabetes type 2 on metformin and glipizide, CKD 3B, hypertension, hyperlipidemia, gout, osteoarthritis, chronic anemia, GERD recently hospitalized 9/25 through 10/8 for fall /syncope and collapse, progressive worsening dementia with decreased oral intake more somnolent and having hallucination.  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   Assessment & Plan:   Principal Problem:   Hypoglycemia Active Problems:   Essential hypertension   GERD (gastroesophageal reflux disease)   Type 2 diabetes mellitus (HCC)   Hypokalemia   Hypomagnesemia   CKD stage 3b, GFR 30-44 ml/min (HCC)   Dementia with behavioral disturbance (HCC)   1-Diabetes type 2, Hypoglycemia - Patient with poor oral intake.  Hypoglycemia in the setting of systemic agents, and poor oral intake. - Continue IV fluids currently on D5 -Will likely need to discontinue glipizide and metformin at discharge.  Acute metabolic encephalopathy Advanced dementia/ -Patient lethargic, keep eyes closed, she will say a few words -Son who was at bedside reports that patient has been declining for months but worse over the last week.  She has not been eating or drinking. - Continue home medication -Will check ammonia level, last hospitalization she had a CT that was unrevealing. -B12 was not low. -Palliative care consulted for goals of cares Recent history of pyogenic flexor tenosynovitis due to MSSA, Status post I&D and  partial amputation of the left index finger - Will change doxycycline to IV, she has not been able to take oral. -She needs to remain on doxycycline through 11 04/2024.  Mild leukocytosis - UA negative for infection. -Will check blood cultures  Hypokalemia/hypomagnesemia - Replete IV, unable to tolerates oral.   History CVA: - Continue with aspirin  and statin for secondary stroke prevention  Hyperlipidemia - On a statin  Hypertension On Norvasc  and Coreg   Gout -Allopurinol .   Uncontrolled GERD -Protonix  40 mg daily.   Chronic HFpEF -Last 2D echo November 2023 with a EF of 50 to 55%, grade 1 DD, mild aortic regurgitation. -Patient looks more on the dry side.   Chronic anemia -Hemoglobin currently stable at 9.4. -Follow H&H. -Transfusion threshold hemoglobin < 8.   CKD stage IIIb -Stable.       Estimated body mass index is 26.78 kg/m as calculated from the following:   Height as of this encounter: 5' 5 (1.651 m).   Weight as of this encounter: 73 kg.   DVT prophylaxis: Lovenox Code Status: DNR Family Communication: Son who was at bedside.  Plan to consult palliative care for goals of care conversation.  Patient has been declining Disposition Plan:  Status is: Observation The patient will require care spanning > 2 midnights and should be moved to inpatient because: Management of hyperglycemia and encephalopathy.    Consultants:  Palliative care  Procedures:  none  Antimicrobials:    Subjective: Patient keeps eyes closed, does say 4.  Wants to be go over she is cold So no I reported that she has been declining for the last couple of months.  Her dementia has been getting worse since she had the stent  in her hand.  She has not been eating well or drinking.  Definitely she has been worse over the last week  Objective: Vitals:   06/14/24 1355 06/14/24 1617 06/14/24 2001 06/15/24 0401  BP: (!) 128/50  (!) 153/58 (!) 154/58  Pulse: 62 (!) 56 63 69   Resp: 16  15 14   Temp: 98.5 F (36.9 C)  (!) 97.4 F (36.3 C) 97.6 F (36.4 C)  TempSrc: Oral  Oral   SpO2: 94%  95% 95%  Weight:      Height:        Intake/Output Summary (Last 24 hours) at 06/15/2024 0807 Last data filed at 06/15/2024 0600 Gross per 24 hour  Intake 1998.06 ml  Output 1150 ml  Net 848.06 ml   Filed Weights   06/13/24 1908  Weight: 73 kg    Examination:  General exam: Appears calm and comfortable  Respiratory system: Clear to auscultation. Respiratory effort normal. Cardiovascular system: S1 & S2 heard, RRR. No JVD, murmurs, rubs, gallops or clicks. No pedal edema. Gastrointestinal system: Abdomen is nondistended, soft and nontender. No organomegaly or masses felt. Normal bowel sounds heard. Central nervous system: Sleepy Extremities: Symmetric 5 x 5 power.    Data Reviewed: I have personally reviewed following labs and imaging studies  CBC: Recent Labs  Lab 06/13/24 1928 06/14/24 0520 06/15/24 0525  WBC 11.4* 10.3 12.3*  NEUTROABS 9.3*  --   --   HGB 9.9* 9.4* 9.7*  HCT 30.4* 29.2* 30.3*  MCV 88.9 91.0 92.9  PLT 202 193 170   Basic Metabolic Panel: Recent Labs  Lab 06/13/24 1928 06/14/24 0520 06/15/24 0525  NA 139 134* 138  K 3.2* 3.3* 3.9  CL 101 98 103  CO2 26 25 23   GLUCOSE 98 140* 131*  BUN 22 20 14   CREATININE 1.35* 1.13* 1.03*  CALCIUM  8.8* 8.6* 8.9  MG  --  1.4* 2.1  PHOS  --   --  2.4*   GFR: Estimated Creatinine Clearance: 41.4 mL/min (A) (by C-G formula based on SCr of 1.03 mg/dL (H)). Liver Function Tests: Recent Labs  Lab 06/13/24 1928 06/15/24 0525  AST 34  --   ALT 20  --   ALKPHOS 112  --   BILITOT 1.0  --   PROT 6.0*  --   ALBUMIN 3.8 3.6   No results for input(s): LIPASE, AMYLASE in the last 168 hours. No results for input(s): AMMONIA in the last 168 hours. Coagulation Profile: No results for input(s): INR, PROTIME in the last 168 hours. Cardiac Enzymes: No results for input(s):  CKTOTAL, CKMB, CKMBINDEX, TROPONINI in the last 168 hours. BNP (last 3 results) No results for input(s): PROBNP in the last 8760 hours. HbA1C: No results for input(s): HGBA1C in the last 72 hours. CBG: Recent Labs  Lab 06/14/24 1605 06/14/24 2002 06/14/24 2336 06/15/24 0401 06/15/24 0735  GLUCAP 236* 247* 166* 164* 146*   Lipid Profile: No results for input(s): CHOL, HDL, LDLCALC, TRIG, CHOLHDL, LDLDIRECT in the last 72 hours. Thyroid  Function Tests: No results for input(s): TSH, T4TOTAL, FREET4, T3FREE, THYROIDAB in the last 72 hours. Anemia Panel: Recent Labs    06/14/24 0520  VITAMINB12 2,187*  FOLATE 8.5  FERRITIN 145  TIBC 262  IRON 94   Sepsis Labs: No results for input(s): PROCALCITON, LATICACIDVEN in the last 168 hours.  No results found for this or any previous visit (from the past 240 hours).       Radiology Studies: CT  ABDOMEN PELVIS W CONTRAST Result Date: 06/13/2024 CLINICAL DATA:  Abdominal pain EXAM: CT ABDOMEN AND PELVIS WITH CONTRAST TECHNIQUE: Multidetector CT imaging of the abdomen and pelvis was performed using the standard protocol following bolus administration of intravenous contrast. RADIATION DOSE REDUCTION: This exam was performed according to the departmental dose-optimization program which includes automated exposure control, adjustment of the mA and/or kV according to patient size and/or use of iterative reconstruction technique. CONTRAST:  80mL OMNIPAQUE  IOHEXOL  300 MG/ML  SOLN COMPARISON:  04/21/2024 FINDINGS: Lower chest: No acute abnormality. Hepatobiliary: No focal liver abnormality is seen. Status post cholecystectomy. No biliary dilatation. Pancreas: No focal abnormality or ductal dilatation. Spleen: No focal abnormality.  Normal size. Adrenals/Urinary Tract: No adrenal abnormality. No focal renal abnormality. No stones or hydronephrosis. Urinary bladder is unremarkable. Stomach/Bowel: Normal appendix.  Stomach, large and small bowel grossly unremarkable. Vascular/Lymphatic: Aortic atherosclerosis. No evidence of aneurysm or adenopathy. Reproductive: Prior hysterectomy.  No adnexal masses. Other: No free fluid or free air. Musculoskeletal: No acute bony abnormality. IMPRESSION: No acute findings in the abdomen or pelvis. Aortic atherosclerosis. Electronically Signed   By: Franky Crease M.D.   On: 06/13/2024 21:35        Scheduled Meds:  allopurinol   300 mg Oral Daily   amLODipine   5 mg Oral Daily   aspirin  EC  81 mg Oral Daily   carvedilol   12.5 mg Oral BID WC   cyanocobalamin   1,000 mcg Oral Daily   doxycycline  100 mg Oral Q12H   enoxaparin (LOVENOX) injection  40 mg Subcutaneous Q24H   feeding supplement  237 mL Oral BID BM   pantoprazole   40 mg Oral Daily   risperiDONE  0.5 mg Oral BID   rosuvastatin   5 mg Oral q1800   sertraline   25 mg Oral Daily   Continuous Infusions:  dextrose  5 % and 0.9 % NaCl 100 mL/hr at 06/15/24 0526     LOS: 0 days    Time spent: 35 minutes    Deetta Siegmann A Sebastyan Snodgrass, MD [facility]   If 7PM-7AM, please contact night-coverage [email or web address]  06/15/2024, 8:07 AM

## 2024-06-15 NOTE — Progress Notes (Addendum)
 Yvonne Newton [phone] [facility] Liaison Note:   This patient has a pending hospice referral at [facility].   Hospital liaisons will follow while she remains in the hospital.   Please call with any hospice related questions.    Eleanor Nail, LPN [facility] Liaison [phone]

## 2024-06-15 NOTE — Plan of Care (Signed)
  Problem: SLP Dysphagia Goals Goal: Misc Dysphagia Goal Flowsheets (Taken 06/15/2024 0953) Misc Dysphagia Goal: SLP will support diet texture modifications and provide careful handfeeding strategies and support in effort to aid safe PO intake and reduce risks of aspiration.

## 2024-06-15 NOTE — Evaluation (Signed)
 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  Patient presented to the ED when glucose was found to be in the 30s at facility.  Dx with severe hypoglycemia.  PMHx includes DM II, HTN, HLD, V-tach, NSTEMI, CAD (cath), CHF, cerebellar CVA, dysarthria, Hx of smoking, CKD III, OA, dementia     Clinical Impressions PTA, patient was participating in short term rehab at an area SNF.  Prior to previous hospitalization, patient was living at home with children present and participating in self care activities with no to minimal assistance.  Patient has experienced a significant decline from PLOF due to acute conditions and presents with deficits in strength, ROM, balance, and cognition.  Patient will benefit from continued inpatient follow up therapy, < 3 hours/day.  Acute OT will continue to follow patient while in the hospital.  Thank you for allowing us  to participate in the care of this patient.      If plan is discharge home, recommend the following:   Two people to help with walking and/or transfers;Two people to help with bathing/dressing/bathroom;Direct supervision/assist for medications management;Direct supervision/assist for financial management;Assistance with cooking/housework;Assist for transportation;Supervision due to cognitive status     Functional Status Assessment   Patient has had a recent decline in their functional status and demonstrates the ability to make significant improvements in function in a reasonable and predictable amount of time.     Equipment Recommendations   None recommended by OT      Precautions/Restrictions   Precautions Precautions: Fall Recall  of Precautions/Restrictions: Impaired Precaution/Restrictions Comments: Decreased alertness this visit Restrictions Weight Bearing Restrictions Per Provider Order: Yes LUE Weight Bearing Per Provider Order: Non weight bearing     Mobility Bed Mobility Overal bed mobility: Needs Assistance Bed Mobility: Rolling Rolling: Max assist   General bed mobility comments: Patient required MinA to maintain side lying for peri care.       Balance Overall balance assessment: Needs assistance, History of Falls    Sitting balance - Comments: NT due to lethargy   Standing balance comment: NT due to lethargy     ADL either performed or assessed with clinical judgement   ADL Overall ADL's : Needs assistance/impaired Eating/Feeding: Set up;Sitting   Grooming: Minimal assistance;Sitting   Upper Body Bathing: Moderate assistance;Sitting   Lower Body Bathing: Maximal assistance;Sitting/lateral leans   Upper Body Dressing : Minimal assistance;Sitting   Lower Body Dressing: Maximal assistance;Sitting/lateral leans   Toilet Transfer: Minimal assistance;Stand-pivot   Toileting- Clothing Manipulation and Hygiene: Total assistance;Sit to/from stand   Functional mobility during ADLs: Maximal assistance;+2 for physical assistance       Vision Baseline Vision/History: 0 No visual deficits Ability to See in Adequate Light: 0 Adequate Additional Comments: Patient required max cues to open eyes            Pertinent Vitals/Pain Pain Assessment Pain Assessment: No/denies pain     Extremity/Trunk Assessment Upper Extremity Assessment Upper Extremity Assessment: Difficult to assess due to impaired cognition;Generalized weakness   Lower Extremity Assessment Lower Extremity Assessment: Defer to PT evaluation      Communication Communication Communication: Impaired Factors Affecting Communication: Reduced clarity of speech   Cognition Arousal: Lethargic Behavior During Therapy:  Flat  affect Cognition: History of cognitive impairments   OT - Cognition Comments: Patient was given multiple noxious stimuli including sternal rub & Babinski; would only alert inconsistently.   Following commands: Impaired Following commands impaired: Follows one step commands inconsistently       General Comments      Patient remained lethargic throughout session; was found to be soiled with stool and NTs provided hygiene while therapist assisted with bed mobility.           Home Living Family/patient expects to be discharged to:: Skilled nursing facility Living Arrangements: Children Available Help at Discharge: Family;Available PRN/intermittently Type of Home: House Home Access: Stairs to enter;Ramped entrance Entrance Stairs-Number of Steps: 2 Entrance Stairs-Rails: Right;Left Home Layout: One level    Bathroom Shower/Tub: Chief Strategy Officer: Standard    Home Equipment: Agricultural consultant (2 wheels)      Prior Functioning/Environment Prior Level of Function : Needs assist  Cognitive Assist : Mobility (cognitive) Mobility (Cognitive): Step by step cues ADLs (Cognitive): Step by step cues Physical Assist : Mobility (physical) Mobility (physical): Bed mobility   Mobility Comments: Patient was in short term rehab was ambulatory with RW during last admission. (prior to last admit--ambulates without AD. At least 3 falls in the past 6 mo. Family provides sup for safety. Family reports they have tried to get her to use RW, but she won't) ADLs Comments: Patient was in short term rehab (prior to last admit-family reports on a good day pt is able to complete ADLs herself with modI to supervision and on a bad day needs assistance. Family manages medications, household chores, and transportation.    OT Problem List: Decreased strength;Decreased activity tolerance;Impaired balance (sitting and/or standing);Decreased cognition;Decreased safety awareness;Decreased knowledge  of precautions;Impaired UE functional use   OT Treatment/Interventions: Self-care/ADL training;Therapeutic exercise;DME and/or AE instruction;Therapeutic activities;Patient/family education;Balance training;Neuromuscular education      OT Goals(Current goals can be found in the care plan section)   Acute Rehab OT Goals Patient Stated Goal: To feel better OT Goal Formulation: With patient Time For Goal Achievement: 06/29/24 Potential to Achieve Goals: Fair ADL Goals Pt Will Perform Grooming: with supervision;sitting Pt Will Transfer to Toilet: with contact guard assist;stand pivot transfer;bedside commode Pt Will Perform Toileting - Clothing Manipulation and hygiene: with min assist;sitting/lateral leans;sit to/from stand   OT Frequency:  Min 2X/week       AM-PAC OT 6 Clicks Daily Activity     Outcome Measure Help from another person eating meals?: A Little Help from another person taking care of personal grooming?: A Lot Help from another person toileting, which includes using toliet, bedpan, or urinal?: Total Help from another person bathing (including washing, rinsing, drying)?: Total Help from another person to put on and taking off regular upper body clothing?: Total Help from another person to put on and taking off regular lower body clothing?: Total 6 Click Score: 9   End of Session Nurse Communication: Other (comment) (Lethergy)  Activity Tolerance: Patient limited by lethargy Patient left: in bed;with call bell/phone within reach;with bed alarm set  OT Visit Diagnosis: Muscle weakness (generalized) (M62.81);Other symptoms and signs involving cognitive function;Cognitive communication deficit (R41.841);History of falling (Z91.81) Symptoms and signs involving cognitive functions: Other cerebrovascular disease                Time: 1406-1430 OT Time Calculation (min): 24 min Charges:  OT General Charges $OT Visit: 1 Visit OT Treatments $Self Care/Home Management :  8-22 mins  Belvie FURY Rickayla Wieland, MS, OTR/L 06/15/2024, 4:38 PM

## 2024-06-16 DIAGNOSIS — E162 Hypoglycemia, unspecified: Secondary | ICD-10-CM | POA: Diagnosis not present

## 2024-06-16 LAB — GLUCOSE, CAPILLARY
Glucose-Capillary: 106 mg/dL — ABNORMAL HIGH (ref 70–99)
Glucose-Capillary: 115 mg/dL — ABNORMAL HIGH (ref 70–99)
Glucose-Capillary: 119 mg/dL — ABNORMAL HIGH (ref 70–99)
Glucose-Capillary: 127 mg/dL — ABNORMAL HIGH (ref 70–99)
Glucose-Capillary: 128 mg/dL — ABNORMAL HIGH (ref 70–99)
Glucose-Capillary: 80 mg/dL (ref 70–99)

## 2024-06-16 LAB — BASIC METABOLIC PANEL WITH GFR
Anion gap: 8 (ref 5–15)
BUN: 10 mg/dL (ref 8–23)
CO2: 26 mmol/L (ref 22–32)
Calcium: 9 mg/dL (ref 8.9–10.3)
Chloride: 107 mmol/L (ref 98–111)
Creatinine, Ser: 1.05 mg/dL — ABNORMAL HIGH (ref 0.44–1.00)
GFR, Estimated: 52 mL/min — ABNORMAL LOW (ref >60)
Glucose, Bld: 112 mg/dL — ABNORMAL HIGH (ref 70–99)
Potassium: 4 mmol/L (ref 3.5–5.1)
Sodium: 141 mmol/L (ref 135–145)

## 2024-06-16 LAB — CBC
HCT: 30.5 % — ABNORMAL LOW (ref 36.0–46.0)
Hemoglobin: 9.7 g/dL — ABNORMAL LOW (ref 12.0–15.0)
MCH: 29.8 pg (ref 26.0–34.0)
MCHC: 31.8 g/dL (ref 30.0–36.0)
MCV: 93.6 fL (ref 80.0–100.0)
Platelets: 154 K/uL (ref 150–400)
RBC: 3.26 MIL/uL — ABNORMAL LOW (ref 3.87–5.11)
RDW: 17 % — ABNORMAL HIGH (ref 11.5–15.5)
WBC: 7.9 K/uL (ref 4.0–10.5)
nRBC: 0 % (ref 0.0–0.2)

## 2024-06-16 LAB — PHOSPHORUS: Phosphorus: 3.1 mg/dL (ref 2.5–4.6)

## 2024-06-16 MED ORDER — PANTOPRAZOLE SODIUM 40 MG IV SOLR
40.0000 mg | Freq: Every day | INTRAVENOUS | Status: DC
  Administered 2024-06-16 – 2024-06-22 (×7): 40 mg via INTRAVENOUS
  Filled 2024-06-16 (×7): qty 10

## 2024-06-16 MED ORDER — HYDRALAZINE HCL 20 MG/ML IJ SOLN
10.0000 mg | Freq: Four times a day (QID) | INTRAMUSCULAR | Status: DC | PRN
  Administered 2024-06-16: 10 mg via INTRAVENOUS
  Filled 2024-06-16: qty 1

## 2024-06-16 MED ORDER — DEXTROSE-SODIUM CHLORIDE 5-0.9 % IV SOLN: INTRAVENOUS | Status: AC

## 2024-06-16 NOTE — Progress Notes (Signed)
 Yvonne Newton [phone] [facility] Liaison Note:   This patient has a pending hospice referral at [facility].   Hospital liaisons will follow while she remains in the hospital.   Please call with any hospice related questions.    Eleanor Nail, LPN [facility] Liaison [phone]

## 2024-06-16 NOTE — Progress Notes (Signed)
 PROGRESS NOTE    Yvonne Newton  FMW:[id number] DOB: Apr 18, 1941 DOA: 06/13/2024 PCP: [facility]   Brief Narrative: 83 year old with past medical history significant for CVA, heart failure preserved ejection fraction, dementia, diabetes type 2 on metformin and glipizide, CKD 3B, hypertension, hyperlipidemia, gout, osteoarthritis, chronic anemia, GERD recently hospitalized 9/25 through 10/8 for fall /syncope and collapse, progressive worsening dementia with decreased oral intake more somnolent and having hallucination.  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   Assessment & Plan:   Principal Problem:   Hypoglycemia Active Problems:   Essential hypertension   GERD (gastroesophageal reflux disease)   Type 2 diabetes mellitus (HCC)   Hypokalemia   Hypomagnesemia   CKD stage 3b, GFR 30-44 ml/min (HCC)   Dementia with behavioral disturbance (HCC)   1-Diabetes type 2, Hypoglycemia - Patient with poor oral intake.  Hypoglycemia in the setting of systemic agents, and poor oral intake. - Continue IV fluids currently on D5 -Will likely need to discontinue glipizide and metformin at discharge. -no further hypoglycemia.   Acute metabolic encephalopathy Advanced dementia/ -Patient lethargic, keep eyes closed, she will say a few words -Son who was at bedside reports that patient has been declining for months but worse over the last week.  She has not been eating or drinking. - Continue home medication -ammonia level normal, last hospitalization she had a CT that was unrevealing. -B12 was not low. -Palliative care consulted for goals of care Continue to be sleepy. No interactive, no oral intake.    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 -She needs to remain on doxycycline through 11 04/2024. Ortho will follow up on patient.   Mild leukocytosis - UA negative for infection. -blood cultures: No growth to date  Hypokalemia/hypomagnesemia - Replaced  History CVA: - Continue with aspirin  and statin for secondary stroke prevention  Hyperlipidemia - On a statin  Hypertension On Norvasc  and Coreg   Gout -Allopurinol .   Uncontrolled GERD -Protonix  40 mg daily.   Chronic HFpEF -Last 2D echo November 2023 with a EF of 50 to 55%, grade 1 DD, mild aortic regurgitation. -Patient looks more on the dry side.   Chronic anemia -Hemoglobin currently stable at 9.4. -Follow H&H. -Transfusion threshold hemoglobin < 8.   CKD stage IIIb -Stable.       Estimated body mass index is 26.78 kg/m as calculated from the following:   Height as of this encounter: 5' 5 (1.651 m).   Weight as of this encounter: 73 kg.   DVT prophylaxis: Lovenox Code Status: DNR Family Communication: Son who was at bedside.  Plan to consult palliative care for goals of care conversation.  Patient has been declining Disposition Plan:  Status is: Observation The patient will require care spanning > 2 midnights and should be moved to inpatient because: Management of hyperglycemia and encephalopathy.    Consultants:  Palliative care  Procedures:  none  Antimicrobials:    Subjective: Keep eyes close, she says quit. Do it fast   Objective: Vitals:   06/15/24 2028 06/16/24 0443 06/16/24 0926 06/16/24 1402  BP: ROLLEN)  159/64 (!) 165/66 (!) 190/67 (!) 146/60  Pulse: 69 80 79 61  Resp:  20  20  Temp:  98.2 F (36.8 C) 98.5 F (36.9 C) 99.2 F (37.3 C)  TempSrc:  Oral Oral Oral  SpO2:  94%  95%  Weight:      Height:         Intake/Output Summary (Last 24 hours) at 06/16/2024 1443 Last data filed at 06/16/2024 0600 Gross per 24 hour  Intake 2074.23 ml  Output 500 ml  Net 1574.23 ml   Filed Weights   06/13/24 1908  Weight: 73 kg    Examination:  General exam: NAD Respiratory system: CTA Cardiovascular system: S 1, S 2 RRR Gastrointestinal system: BS present, soft, nt Central nervous system: lethargic Extremities: no edema    Data Reviewed: I have personally reviewed following labs and imaging studies  CBC: Recent Labs  Lab 06/13/24 1928 06/14/24 0520 06/15/24 0525 06/16/24 0921  WBC 11.4* 10.3 12.3* 7.9  NEUTROABS 9.3*  --   --   --   HGB 9.9* 9.4* 9.7* 9.7*  HCT 30.4* 29.2* 30.3* 30.5*  MCV 88.9 91.0 92.9 93.6  PLT 202 193 170 154   Basic Metabolic Panel: Recent Labs  Lab 06/13/24 1928 06/14/24 0520 06/15/24 0525 06/16/24 0921  NA 139 134* 138 141  K 3.2* 3.3* 3.9 4.0  CL 101 98 103 107  CO2 26 25 23 26   GLUCOSE 98 140* 131* 112*  BUN 22 20 14 10   CREATININE 1.35* 1.13* 1.03* 1.05*  CALCIUM  8.8* 8.6* 8.9 9.0  MG  --  1.4* 2.1  --   PHOS  --   --  2.4* 3.1   GFR: Estimated Creatinine Clearance: 40.6 mL/min (A) (by C-G formula based on SCr of 1.05 mg/dL (H)). Liver Function Tests: Recent Labs  Lab 06/13/24 1928 06/15/24 0525 06/15/24 1251  AST 34  --  28  ALT 20  --  18  ALKPHOS 112  --  109  BILITOT 1.0  --  1.1  PROT 6.0*  --  5.3*  ALBUMIN 3.8 3.6 3.3*   No results for input(s): LIPASE, AMYLASE in the last 168 hours. Recent Labs  Lab 06/15/24 1344  AMMONIA 14   Coagulation Profile: No results for input(s): INR, PROTIME in the last 168 hours. Cardiac Enzymes: No results for input(s): CKTOTAL, CKMB, CKMBINDEX, TROPONINI in the last 168 hours. BNP (last 3 results) No results for input(s): PROBNP in the last 8760 hours. HbA1C: No results for input(s): HGBA1C in the last 72 hours. CBG: Recent Labs  Lab 06/15/24 2002  06/15/24 2347 06/16/24 0432 06/16/24 0718 06/16/24 1200  GLUCAP 131* 107* 80 106* 127*   Lipid Profile: No results for input(s): CHOL, HDL, LDLCALC, TRIG, CHOLHDL, LDLDIRECT in the last 72 hours. Thyroid  Function Tests: No results for input(s): TSH, T4TOTAL, FREET4, T3FREE, THYROIDAB in the last 72 hours. Anemia Panel: Recent Labs    06/14/24 0520  VITAMINB12 2,187*  FOLATE 8.5  FERRITIN 145  TIBC 262  IRON 94   Sepsis Labs: No results for input(s): PROCALCITON, LATICACIDVEN in the last 168 hours.  Recent Results (from the past 240 hours)  Culture, blood (Routine X 2) w Reflex to ID Panel     Status: None (Preliminary result)   Collection Time: 06/15/24 12:50 PM   Specimen: BLOOD RIGHT ARM  Result Value Ref Range Status   Specimen Description   Final    BLOOD RIGHT ARM Performed at [facility]  [facility], [address], [place], [name] [address]    Special Requests   Final    BOTTLES DRAWN AEROBIC AND ANAEROBIC Blood Culture adequate volume Performed at [facility], [address], [place], [name] [address]    Culture   Final    NO GROWTH < 12 HOURS Performed at [facility], [address] [place], [name] [address]    Report Status PENDING  Incomplete  Culture, blood (Routine X 2) w Reflex to ID Panel     Status: None (Preliminary result)   Collection Time: 06/15/24 12:50 PM   Specimen: BLOOD  Result Value Ref Range Status   Specimen Description   Final    BLOOD BLOOD LEFT ARM Performed at [facility], [address], [place], [name] [address]    Special Requests   Final    BOTTLES DRAWN AEROBIC ONLY Blood Culture adequate volume Performed at [facility], [address] [place], [name] [address]    Culture   Final    NO GROWTH < 12 HOURS Performed at [facility], [address], [place], [name] [address]    Report Status PENDING  Incomplete          Radiology Studies: No results found.       Scheduled Meds:  allopurinol   300 mg Oral Daily   amLODipine   5 mg Oral Daily   aspirin  EC  81 mg Oral Daily   carvedilol   12.5 mg Oral BID WC   cyanocobalamin   1,000 mcg Oral Daily   enoxaparin (LOVENOX) injection  40 mg Subcutaneous Q24H   feeding supplement  237 mL Oral BID BM   pantoprazole  (PROTONIX ) IV  40 mg Intravenous QHS   risperiDONE  0.5 mg Oral BID   rosuvastatin   5 mg Oral q1800   sertraline   25 mg Oral Daily   Continuous Infusions:  dextrose  5 % and 0.9 % NaCl 100 mL/hr at 06/16/24 0503   doxycycline (VIBRAMYCIN) IV 100 mg (06/16/24 1143)     LOS: 1 day    Time spent: 35 minutes    Karoline Fleer A Kawthar Ennen, MD [facility]   If 7PM-7AM, please contact night-coverage [email or web address]  06/16/2024, 2:43 PM

## 2024-06-16 NOTE — TOC Progression Note (Addendum)
 Transition of Care [facility]) - Progression Note    Patient Details  Name: Yvonne Newton MRN: [id number] Date of Birth: Apr 10, 1941  Transition of Care [facility] CM/SW Contact  Narelle Schoening, Nathanel, RN Phone Number: 06/16/2024, 2:35 PM  Clinical Narrative:   Spoke to Tony(son) he prefers residential hospice since he is unable to afford $300 rm/board for [facility] w/hospice. [facility] can only accept back if has a skilled need for ST SNF need through insurance(unlikely w/HTA) Please advise if appropriate for Residential hospice.Please place referral for choice for residential hospice-Tony chose [facility]  -2:39p please place referral for choice for residential hospice-Tony chose [facility]-rep Melissa to eval in am no beds available today.    Expected Discharge Plan:  (TBD) Barriers to Discharge:  (continu)               Expected Discharge Plan and Services   Discharge Planning Services: CM Consult Post Acute Care Choice: Skilled Nursing Facility                                         Social Drivers of Health (SDOH) Interventions SDOH Screenings   Food Insecurity: Patient Declined (06/14/2024)  Housing: Unknown (06/14/2024)  Transportation Needs: Patient Declined (06/14/2024)  Utilities: Patient Declined (06/14/2024)  Financial Resource Strain: Low Risk  (05/03/2024)   Received from [facility]  Social Connections: Unknown (06/14/2024)  Tobacco Use: Medium Risk (06/13/2024)    Readmission Risk Interventions     No data to display

## 2024-06-16 NOTE — Consult Note (Signed)
 Palliative Care Consult Note                                  Date: 06/16/2024   Patient Name: Yvonne Newton  DOB:12/08/1940  FMW:[id number]  Age / Sex:83 y.o., female  PCP: Yvonne Newton Referring Physician: Madelyne Newton LABOR, MD  Reason for Consultation: Establishing goals of care  Past Medical History:  Diagnosis Date   Arthritis 07/22/2017   Cerebellar stroke [facility])    CHF (congestive heart failure) (HCC)    Dementia (HCC)    Diabetes mellitus type 2 in nonobese (HCC)    DM (diabetes mellitus) (HCC)    Dysarthria    Essential hypertension 07/22/2017   Hyperlipidemia, unspecified 07/22/2017   OA (osteoarthritis) of knee    right   Obesity    Stroke (HCC)    Tachycardia 07/22/2017   VENTRICULAR TACHYCARDIA 09/05/2009   Qualifier: Diagnosis of  By: Yvonne Newton CMA, Yvonne Newton     Ventricular tachycardia (HCC)    Vitamin B 12 deficiency      Assessment & Plan:   HPI/Patient Profile: 83 y.o. female  with past medical history of CVA, HFpEF (EF 50% in 2023), dementia, CKD IIIB,  admitted on 06/13/2024 with hypoglycemia. Palliative consulted for goals of care conversation.    Patient previously seen by palliative on 06/05/2024  during previous hospitalization for progressive dementia FTT and hallucinations. Living will states that she does not want prolonged aggressive interventions. Previous discharge plan was for palliative outpatient with [facility] to follow along with SNF.   Hypoglycemia improved after D10 administration. CT A/P no acute findings, UA not suggestive of UTI. Some hypokalemia and improving creatinine 1.05 from 1.8. Continues to have poor PO intake. Continues doxycyline for MSSA for left finger s/p partial amputation.   Continues Respirdal and zoloft .   Son Yvonne Newton [id number]  Patient seen and examined, resting comfortably in bed, does stir when her name is called loudly in the room, otherwise,  doesn't awaken or interact. Call placed and was able to reach son Newton.   Palliative medicine is specialized medical care for people living with serious illness. It focuses on providing relief from the symptoms and stress of a serious illness. The goal is to improve quality of life for both the patient and the family. Goals of care: Broad aims of medical therapy in relation to the patient's values and preferences. Our aim is to provide medical care aimed at enabling patients to achieve the goals that matter most to them, given the circumstances of their particular medical situation and their constraints.   Discussed with son about patient's condition. Goals of care discussions undertaken. No PEG. Hospice at [facility].   SUMMARY OF RECOMMENDATIONS   DNR DNI No PEG tube Hosp[facility] follow [facility].   Symptom Management:   As above   Code Status: DNR - Limited (DNR/DNI)  Prognosis:  < 6 months  Discharge Planning:  Skilled Nursing Facility with Hospice   Discussed with:  patient, son Newton on phone.   Subjective:   Reviewed medical records, received report from team, assessed the patient and then meet at the patient's bedside to discuss diagnosis, prognosis, GOC, EOL wishes disposition and options.  Before meeting with the patient/family, I spent time reviewing the chart notes including  prior inpatient palliative consult note. I also reviewed vital signs, nursing flowsheets, medication administrations record, labs, and imaging.  We meet to discuss diagnosis prognosis, GOC, EOL wishes, disposition and options.   Phone call with son Yvonne Newton:   Palliative Care is specialized medical care for people and their families living with serious illness.  If focuses on providing relief from the symptoms and stress of a serious illness.  The goal is to improve quality of life for both the patient and the family. Values and goals of care important to patient and family were attempted to be  elicited.  Created space and opportunity for patient  and family to explore thoughts and feelings regarding current medical situation   Natural trajectory and current clinical status were discussed. Questions and concerns addressed. Patient encouraged to call with questions or concerns.    Patient/Family Understanding of Illness:   - Discussed with son about current hospitalization and trajectory given requirement for dextrose  infusion - Son understands and accepting that the patient does not desire artificial feedings   - Was living at home prior to previous hospitalization - Was at [facility] after previous discharge   - Poor PO intake in the last couple of months,    Review of Systems  Objective:   Primary Diagnoses: Present on Admission:  Hypoglycemia  GERD (gastroesophageal reflux disease)  Essential hypertension   Vital Signs:  BP (!) 165/66 (BP Location: Left Arm)   Pulse 80   Temp 98.2 F (36.8 C) (Oral)   Resp 20   Ht 5' 5 (1.651 m)   Wt 73 kg   SpO2 94%   BMI 26.78 kg/m   Physical Exam Resting in bed No distress Does not verbalize Shallow regular breath sounds Abdomen not distended Trace edema  Palliative Assessment/Data:  30%     Thank you for allowing us  to participate in the care of Yvonne Newton PMT will continue to support holistically.  Time Total:  75 Billing based on MDM:  high MDM  Problems Addressed: One acute or chronic illness or injury that poses a threat to life or bodily function  Amount and/or Complexity of Data: Category 3:Discussion of management or test interpretation with external physician/other qualified health care professional/appropriate source (not separately reported)  Risks: Drug therapy requiring intensive monitoring for toxicity, Decision regarding hospitalization or escalation of hospital care, and Decision not to resuscitate or to de-escalate care because of poor prognosis  Detailed review of medical records  (labs, imaging, vital signs), medically appropriate exam, discussed with treatment team, counseling and education to patient, family, & staff, documenting clinical information, medication management, coordination of care.  Signed by: Lonia Serve MD.  Palliative Medicine Team  Team Phone # [phone] (Nights/Weekends)  06/16/2024, 8:09 AM

## 2024-06-17 ENCOUNTER — Ambulatory Visit: Admitting: Internal Medicine

## 2024-06-17 DIAGNOSIS — E162 Hypoglycemia, unspecified: Secondary | ICD-10-CM | POA: Diagnosis not present

## 2024-06-17 LAB — GLUCOSE, CAPILLARY
Glucose-Capillary: 122 mg/dL — ABNORMAL HIGH (ref 70–99)
Glucose-Capillary: 108 mg/dL — ABNORMAL HIGH (ref 70–99)
Glucose-Capillary: 129 mg/dL — ABNORMAL HIGH (ref 70–99)
Glucose-Capillary: 95 mg/dL (ref 70–99)
Glucose-Capillary: 115 mg/dL — ABNORMAL HIGH (ref 70–99)

## 2024-06-17 MED ORDER — HYDROMORPHONE HCL 1 MG/ML IJ SOLN
0.5000 mg | INTRAMUSCULAR | Status: DC | PRN
  Administered 2024-06-18 – 2024-06-21 (×3): 0.5 mg via INTRAVENOUS
  Filled 2024-06-17 (×3): qty 0.5

## 2024-06-17 MED ORDER — DEXTROSE-SODIUM CHLORIDE 5-0.9 % IV SOLN: INTRAVENOUS | Status: AC

## 2024-06-17 NOTE — Progress Notes (Signed)
 Secure chat from [place], RN stating that family is agreeable to IV.  Family is not at bedside on arrival to room, will place as conversation was had with bedside nurse.

## 2024-06-17 NOTE — Progress Notes (Signed)
 SLP Cancellation Note  Patient Details Name: Yvonne Newton MRN: [id number] DOB: [birth year]   Cancelled treatment:       Reason Eval/Treat Not Completed: Other (comment);Fatigue/lethargy limiting ability to participate (Pt lethargic at this time and RN reports she will not swallow her medications- spits them out; note hospice referral pending at [facility])  [name], MS [facility] SLP Acute Rehab Services Office [phone]  Nicolas Emmie Caldron 06/17/2024, 8:02 AM

## 2024-06-17 NOTE — Progress Notes (Signed)
 Orthopaedic Surgery Hand and Upper Extremity History and Physical Examination 06/17/2024   CC: Follow up after left hand I&D and index partial amputation  HPI: Yvonne Newton is a 83 y.o. female who was readmitted to the hospital from her SNF due to hypoglycemia a few days ago.  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   Past Medical History: Past Medical History:  Diagnosis Date   Arthritis 07/22/2017   Cerebellar stroke (HCC)    CHF (congestive heart failure) (HCC)    Dementia (HCC)    Diabetes mellitus type 2 in nonobese (HCC)    DM (diabetes mellitus) (HCC)    Dysarthria    Essential hypertension 07/22/2017   Hyperlipidemia, unspecified 07/22/2017   OA (osteoarthritis) of knee    right   Obesity    Stroke (HCC)    Tachycardia 07/22/2017   VENTRICULAR TACHYCARDIA 09/05/2009   Qualifier: Diagnosis of  By: Lawernce CMA, Jewel     Ventricular tachycardia (HCC)    Vitamin B 12 deficiency      Medications: Scheduled Meds:  carvedilol   12.5 mg Oral BID WC   enoxaparin (LOVENOX) injection  40 mg Subcutaneous Q24H   feeding supplement  237 mL Oral BID BM   pantoprazole  (PROTONIX ) IV  40 mg Intravenous QHS   risperiDONE  0.5 mg Oral BID   sertraline   25 mg Oral Daily   Continuous Infusions:  dextrose  5 % and 0.9 % NaCl 100 mL/hr at 06/17/24 1147   doxycycline (VIBRAMYCIN) IV 100 mg (06/17/24 1041)   PRN Meds:.acetaminophen  **OR** acetaminophen , hydrALAZINE, HYDROmorphone (DILAUDID) injection, ondansetron  (ZOFRAN ) IV  Allergies: Allergies as of 06/13/2024 - Review Complete 06/13/2024  Allergen Reaction Noted   Blueberry flavoring agent (non-screening) Hives and Shortness Of Breath 11/01/2017   Influenza vaccines  07/14/2022   Penicillins Itching 05/28/2024   Sulfa antibiotics Itching 03/12/2014   Sulfonamide derivatives Itching    Other Swelling and  Rash 02/01/2016    Past Surgical History: Past Surgical History:  Procedure Laterality Date   AMPUTATION FINGER Left 05/28/2024   Procedure: AMPUTATION, FINGER;  Surgeon: Delene Marsa HERO, MD;  Location: [facility] OR;  Service: Orthopedics;  Laterality: Left;  PARTIAL   BREAST BIOPSY Left 2010   Negative   CORONARY PRESSURE/FFR STUDY N/A 07/14/2022   Procedure: INTRAVASCULAR PRESSURE WIRE/FFR STUDY;  Surgeon: Darron Deatrice LABOR, MD;  Location: [facility] INVASIVE CV LAB;  Service: Cardiovascular;  Laterality: N/A;   INCISION AND DRAINAGE OF WOUND Left 05/28/2024   Procedure: IRRIGATION AND DEBRIDEMENT WOUND;  Surgeon: Delene Marsa HERO, MD;  Location: [facility] OR;  Service: Orthopedics;  Laterality: Left;  LEFT INDEX FINGER and HAND   LEFT HEART CATH AND CORONARY ANGIOGRAPHY N/A 07/14/2022   Procedure: LEFT HEART CATH AND CORONARY ANGIOGRAPHY;  Surgeon: Darron Deatrice LABOR, MD;  Location: [facility] INVASIVE CV LAB;  Service: Cardiovascular;  Laterality: N/A;   LOOP RECORDER INSERTION N/A 11/03/2017   Procedure: LOOP RECORDER INSERTION;  Surgeon: Inocencio Soyla Lunger, MD;  Location: [facility] INVASIVE CV LAB;  Service: Cardiovascular;  Laterality: N/A;   TEE WITHOUT CARDIOVERSION N/A 11/03/2017   Procedure: TRANSESOPHAGEAL ECHOCARDIOGRAM (TEE);  Surgeon: Maranda Leim DEL, MD;  Location: [facility] ENDOSCOPY;  Service: Cardiovascular;  Laterality: N/A;     Social History: Social History   Occupational History   Not on file  Tobacco Use   Smoking status: Former    Current packs/day: 0.00  Types: Cigarettes    Quit date: 09/01/1998    Years since quitting: 25.8   Smokeless tobacco: Never  Vaping Use   Vaping status: Never Used  Substance and Sexual Activity   Alcohol use: No   Drug use: No   Sexual activity: Not on file     Family History: Family History  Problem Relation Age of Onset   Heart attack Father 52   Heart attack Brother 19   Heart attack Brother        had CABG   Breast cancer Neg Hx     Otherwise, no relevant orthopaedic family history  ROS: Review of Systems: All systems reviewed and are negative except that mentioned in HPI  Work/Sport/Hobbies: See HPI  Physical Examination: Vitals:   06/16/24 2235 06/17/24 0620  BP: (!) 141/48 (!) 169/56  Pulse: 81   Resp:  18  Temp:  98.6 F (37 C)  SpO2:  96%   Constitutional: eyes remain closed.  Answers few questions but otherwise resting  Left Upper Extremity / Hand Sutures intact.  No erythema or drainage around incision sites. Reports intact sensation on radial and ulnar aspects of index stump.   Pertinent Labs: n/a  Imaging: I have personally reviewed the following studies: N/a  Additional Studies: n/a  Assessment/Plan: Yvonne Newton is a 83 y.o. female s/p left hand I&D and partial amputation of the index finger on 05/28/24.   -Plan for daily dressing changes: telfa, gauze, ACE  then just gauze and ACE after sutures removed. -sutures can be removed on Monday -abx per hospitalist/ID   Marsa Christen, MD Hand and Upper Extremity Surgery [facility] [phone] 06/17/2024 11:54 AM

## 2024-06-17 NOTE — Progress Notes (Signed)
 Arrived at bedside for IV team request for start.  Son states that she is going to hospice and does not want her to be stuck at this time.  Secure chat to Dr. Madelyne.  Per Dr. Regalado, okay to leave IV fluids off.

## 2024-06-17 NOTE — TOC Transition Note (Addendum)
 Transition of Care [facility]) - Discharge Note   Patient Details  Name: Yvonne Newton MRN: [id number] Date of Birth: [birth year]  Transition of Care [facility]) CM/SW Contact:  Bascom Service, RN Phone Number: 06/17/2024, 12:18 PM   Clinical Narrative: Spoke to Tony(son) agree to residential hospice-[facility]-rep Melissa following for acceptance, & bed availability. DNR.PTAR @ d/c.  -3p-Per Luz care rep Melissa-Tony(son) agreed- no [facility] bed available-agreed to [facility]osp[facility] Home in [place]-no bed available today-will assess tomorrow.MD updated.     Final next level of care: Hospice Medical Facility Barriers to Discharge: No Barriers Identified   Patient Goals and CMS Choice Patient states their goals for this hospitalization and ongoing recovery are:: Return back to [facility] CMS Medicare.gov Compare Post Acute Care list provided to:: Patient Represenative (must comment) (Tony(son)) Choice offered to / list presented to : Adult Children [place] ownership interest in [facility].provided to:: Adult Children    Discharge Placement                       Discharge Plan and Services Additional resources added to the After Visit Summary for     Discharge Planning Services: CM Consult Post Acute Care Choice: Residential Hospice Bed                               Social Drivers of Health (SDOH) Interventions SDOH Screenings   Food Insecurity: Patient Declined (06/14/2024)  Housing: Unknown (06/14/2024)  Transportation Needs: Patient Declined (06/14/2024)  Utilities: Patient Declined (06/14/2024)  Financial Resource Strain: Low Risk  (05/03/2024)   Received from [facility]  Social Connections: Unknown (06/14/2024)  Tobacco Use: Medium Risk (06/13/2024)     Readmission Risk Interventions     No data to display

## 2024-06-17 NOTE — Progress Notes (Signed)
 Daily Progress Note   Patient Name: Yvonne Newton       Date: 06/17/2024 DOB: [birth year]  Age: 83 y.o. MRN#: [id number] Attending Physician: Madelyne Owen LABOR, MD Primary Care Physician: [facility] Admit Date: 06/13/2024  Reason for Consultation/Follow-up: Establishing goals of care  Subjective: Not awake not alert  Length of Stay: 2  Current Medications: Scheduled Meds:   allopurinol   300 mg Oral Daily   amLODipine   5 mg Oral Daily   aspirin  EC  81 mg Oral Daily   carvedilol   12.5 mg Oral BID WC   cyanocobalamin   1,000 mcg Oral Daily   enoxaparin (LOVENOX) injection  40 mg Subcutaneous Q24H   feeding supplement  237 mL Oral BID BM   pantoprazole  (PROTONIX ) IV  40 mg Intravenous QHS   risperiDONE  0.5 mg Oral BID   rosuvastatin   5 mg Oral q1800   sertraline   25 mg Oral Daily    Continuous Infusions:  doxycycline (VIBRAMYCIN) IV Stopped (06/16/24 2322)    PRN Meds: acetaminophen  **OR** acetaminophen , hydrALAZINE, ondansetron  (ZOFRAN ) IV  Physical Exam         Resting in bed Appears with generalized weakness Shallow regular breath sounds No distress  Vital Signs: BP (!) 169/56 (BP Location: Left Arm) Comment: With resistance from pt  Pulse 81   Temp 98.6 F (37 C) (Oral)   Resp 18   Ht 5' 5 (1.651 m)   Wt 73 kg   SpO2 96%   BMI 26.78 kg/m  SpO2: SpO2: 96 % O2 Device: O2 Device: Room Air O2 Flow Rate:    Intake/output summary:  Intake/Output Summary (Last 24 hours) at 06/17/2024 1034 Last data filed at 06/17/2024 0615 Gross per 24 hour  Intake 2037.86 ml  Output 250 ml  Net 1787.86 ml   LBM: Last BM Date : 06/15/24 Baseline Weight: Weight: 73 kg Most recent weight: Weight: 73 kg       Palliative Assessment/Data:      Patient  Active Problem List   Diagnosis Date Noted   Hypoglycemia 06/14/2024   Hypokalemia 06/14/2024   Hypomagnesemia 06/14/2024   CKD stage 3b, GFR 30-44 ml/min (HCC) 06/14/2024   Dementia with behavioral disturbance (HCC) 06/14/2024   Suppurative tenosynovitis of flexor tendon of left hand 05/28/2024   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) 05/28/2024   Infected finger 05/27/2024   Acute kidney injury superimposed on chronic kidney disease 05/27/2024   Hyperkalemia 05/27/2024   Controlled type 2 diabetes mellitus with hypoglycemia, without long-term current use of insulin  (HCC) 05/27/2024   Acute metabolic encephalopathy 05/27/2024   Fall at home, initial encounter 05/27/2024   Metabolic acidosis 05/27/2024   Lactic acidosis 05/27/2024   Normocytic anemia 05/27/2024   Gout 05/27/2024   GERD (gastroesophageal reflux disease) 05/27/2024   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   Coronary artery disease involving native coronary artery of native heart with unstable angina pectoris (HCC) 07/12/2022   NSTEMI (non-ST elevated myocardial infarction) (HCC) 07/11/2022   History of cerebellar stroke 2019 07/11/2022   History of ventricular tachycardia 07/11/2022   Chest pain 07/11/2022   Dementia without behavioral disturbance (HCC) 07/11/2022   Acute respiratory failure with hypoxia (HCC) 07/11/2022   Acute on chronic diastolic CHF (congestive heart failure) (HCC) 07/11/2022   Cerebellar stroke (HCC)    Dysarthria    Diabetes mellitus type 2 in nonobese (HCC)    Allergy 07/22/2017   Arthritis 07/22/2017   Type 2 diabetes mellitus (HCC) 07/22/2017   Hyperlipidemia, unspecified 07/22/2017   Essential hypertension 07/22/2017   VT (ventricular tachycardia) (HCC) 07/22/2017   OBESITY 09/05/2009   VENTRICULAR TACHYCARDIA 09/05/2009    Palliative Care Assessment & Plan   Patient Profile:    Assessment:  83 y.o. female  with past medical history of CVA, HFpEF (EF 50% in 2023),  dementia, CKD IIIB,  admitted on 06/13/2024 with hypoglycemia. Palliative consulted for goals of care conversation.     Patient previously seen by palliative on 06/05/2024  during previous hospitalization for progressive dementia FTT and hallucinations. Living will states that she does not want prolonged aggressive interventions. Previous discharge plan was for palliative outpatient with [facility] to follow along with SNF.    Hypoglycemia improved after D10 administration. CT A/P no acute findings, UA not suggestive of UTI. Some hypokalemia and improving creatinine 1.05 from 1.8. Continues to have poor PO intake. Continues doxycyline for MSSA for left finger s/p partial amputation.    Continues Respirdal and zoloft .   Recommendations/Plan:  Discontinue PO medications no longer important, reduce pill burden Patient with essentially nil PO intake Residential hospice evaluation Add IV Dilaudid PRN.   Goals of Care and Additional Recommendations: Limitations on Scope of Treatment: No Artificial Feeding  Code Status:    Code Status Orders  (From admission, onward)           Start     Ordered   06/14/24 0111  Do not attempt resuscitation (DNR)- Limited -Do Not Intubate (DNI)  Continuous       Question Answer Comment  If pulseless and not breathing No CPR or chest compressions.   In Pre-Arrest Conditions (Patient Is Breathing and Has A Pulse) Do not intubate. Provide all appropriate non-invasive medical interventions. Avoid ICU transfer unless indicated or required.   Consent: Discussion documented in EHR or advanced directives reviewed      06/14/24 0112           Code Status History     Date Active Date Inactive Code Status Order ID Comments User Context   06/05/2024 0932 06/08/2024 2032 Do not attempt resuscitation (DNR) - Comfort care [id number]  Esequiel Rosaline GRADE, NP Inpatient   05/27/2024 0947 06/05/2024 0932 Full Code [id number]  Claudene Maximino LABOR, MD ED   07/11/2022 2043  07/15/2022 1823 Full Code [id number]  Cleatus Delayne GAILS, MD  ED   11/02/2017 0412 11/05/2017 2107 Full Code [id number]  Jonel Lonni SQUIBB, MD Inpatient       Prognosis:  < 2 weeks  Discharge Planning: Hospice facility  Care plan was discussed with son Koren on the phone.   Thank you for allowing the Palliative Medicine Team to assist in the care of this patient.  High MDM     Greater than 50%  of this time was spent counseling and coordinating care related to the above assessment and plan.  Lonia Serve, MD  Please contact Palliative Medicine Team phone at [phone] for questions and concerns.

## 2024-06-17 NOTE — Plan of Care (Incomplete)

## 2024-06-17 NOTE — Progress Notes (Signed)
 SLP Cancellation Note  Patient Details Name: Yvonne Newton MRN: [id number] DOB: Nov 14, 1940   Cancelled treatment:       Reason Eval/Treat Not Completed: Other (comment) (pt lethargic, now for residential hospice, please reconsult if desire)  [name], MS [facility] SLP Acute Rehab Services Office [phone]  Nicolas Emmie Caldron 06/17/2024, 3:23 PM

## 2024-06-17 NOTE — Discharge Summary (Signed)
 Physician Discharge Summary   Patient: Yvonne Newton MRN: [id number] DOB: December 01, 1940  Admit date:     06/13/2024  Discharge date:   Discharge Physician: Owen DELENA Lore   PCP: [facility]   Recommendations at discharge:    Patient to be transfer to residential Hospice.   Discharge Diagnoses: Principal Problem:   Hypoglycemia Active Problems:   Essential hypertension   GERD (gastroesophageal reflux disease)   Type 2 diabetes mellitus (HCC)   Hypokalemia   Hypomagnesemia   CKD stage 3b, GFR 30-44 ml/min (HCC)   Dementia with behavioral disturbance (HCC)  Resolved Problems:   * No resolved hospital problems. *  Hospital Course: 83 year old with past medical history significant for CVA, heart failure preserved ejection fraction, dementia, diabetes type 2 on metformin and glipizide, CKD 3B, hypertension, hyperlipidemia, gout, osteoarthritis, chronic anemia, GERD recently hospitalized 9/25 through 10/8 for fall /syncope and collapse, progressive worsening dementia with decreased oral intake more somnolent and having hallucination. 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   Assessment and Plan: 1-Diabetes type 2, Hypoglycemia - Patient with poor oral intake.  Hypoglycemia in the setting of systemic agents, and poor oral intake. - Continue IV fluids currently on D5 -Discontinue glipizide and metformin at discharge. -no further hypoglycemia.    Acute metabolic encephalopathy Advanced dementia/ -Patient lethargic, keep eyes closed, she will say a few words -Son who was at bedside reports that patient has been declining for months but worse over the last week.  She has not been eating or  drinking. - Continue home medication -ammonia level normal, last hospitalization she had a CT that was unrevealing. -B12 was not low. -Palliative care consulted for goals of care, plan to transition to residential hospice.  Continue to be sleepy. No interactive, no oral intake.    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 -She needs to remain on doxycycline through 11 04/2024. Dressing changes, suture remove on Monday    Mild leukocytosis - UA negative for infection. -blood cultures: No growth to date   Hypokalemia/hypomagnesemia - Replaced   History CVA: - Continue with aspirin  and statin for secondary stroke prevention   Hyperlipidemia - DC statins, transition to comfort care.    Hypertension On  Coreg    Gout -discontinue Allopurinol .   Uncontrolled GERD -Protonix  40 mg daily.   Chronic HFpEF -Last 2D echo November 2023 with a EF of 50 to 55%, grade 1 DD, mild aortic regurgitation.    Chronic anemia -Hemoglobin currently stable at 9.4. -Follow H&H.    CKD stage IIIb -Stable.           Consultants: Palliative care Procedures performed: none Disposition: Residential Hospice Diet recommendation:  Discharge Diet Orders (From admission, onward)     Start     Ordered   06/17/24 0000  Diet general        06/17/24 1327           Regular diet DISCHARGE MEDICATION: Allergies as of 06/17/2024       Reactions   Blueberry Flavoring Agent (non-screening) Hives, Shortness Of Breath  Influenza Vaccines    Penicillins Itching   Sulfa Antibiotics Itching   Sulfonamide Derivatives Itching   Other Swelling, Rash   METAL        Medication List     STOP taking these medications    allopurinol  300 MG tablet Commonly known as: ZYLOPRIM    amLODipine  5 MG tablet Commonly known as: NORVASC     aspirin  EC 81 MG tablet   colchicine  0.6 MG tablet   cyanocobalamin  1000 MCG tablet Commonly known as: VITAMIN B12   diltiazem  120 MG 24 hr capsule Commonly known as: CARDIZEM  CD   doxycycline 100 MG tablet Commonly known as: VIBRA-TABS   feeding supplement Liqd   furosemide  40 MG tablet Commonly known as: LASIX    losartan  25 MG tablet Commonly known as: COZAAR    metFORMIN 500 MG tablet Commonly known as: GLUCOPHAGE   potassium chloride  SA 20 MEQ tablet Commonly known as: KLOR-CON  M   rosuvastatin  5 MG tablet Commonly known as: Crestor    sertraline  25 MG tablet Commonly known as: ZOLOFT        TAKE these medications    carvedilol  12.5 MG tablet Commonly known as: COREG  Take 1 tablet (12.5 mg total) by mouth 2 (two) times daily with a meal.   pantoprazole  20 MG tablet Commonly known as: PROTONIX  Take 20 mg by mouth daily.   risperiDONE 0.5 MG tablet Commonly known as: RISPERDAL TAKE 1 TAB BY MOUTH 2 TIMES DAILY FOR 30 DAYS START 1 IN AFTERNOON AROUND 2 PM AND TITRATE IF NEEDED        Discharge Exam: Filed Weights   06/13/24 1908  Weight: 73 kg   General; Sleepy, no distress  Condition at discharge: poor  The results of significant diagnostics from this hospitalization (including imaging, microbiology, ancillary and laboratory) are listed below for reference.   Imaging Studies: CT ABDOMEN PELVIS W CONTRAST Result Date: 06/13/2024 CLINICAL DATA:  Abdominal pain EXAM: CT ABDOMEN AND PELVIS WITH CONTRAST TECHNIQUE: Multidetector CT imaging of the abdomen and pelvis was performed using the standard protocol following bolus administration of intravenous contrast. RADIATION DOSE REDUCTION: This exam was performed according to the departmental dose-optimization program which includes automated exposure control, adjustment of the mA and/or kV according to patient size and/or use of iterative reconstruction technique. CONTRAST:  80mL OMNIPAQUE  IOHEXOL  300  MG/ML  SOLN COMPARISON:  04/21/2024 FINDINGS: Lower chest: No acute abnormality. Hepatobiliary: No focal liver abnormality is seen. Status post cholecystectomy. No biliary dilatation. Pancreas: No focal abnormality or ductal dilatation. Spleen: No focal abnormality.  Normal size. Adrenals/Urinary Tract: No adrenal abnormality. No focal renal abnormality. No stones or hydronephrosis. Urinary bladder is unremarkable. Stomach/Bowel: Normal appendix. Stomach, large and small bowel grossly unremarkable. Vascular/Lymphatic: Aortic atherosclerosis. No evidence of aneurysm or adenopathy. Reproductive: Prior hysterectomy.  No adnexal masses. Other: No free fluid or free air. Musculoskeletal: No acute bony abnormality. IMPRESSION: No acute findings in the abdomen or pelvis. Aortic atherosclerosis. Electronically Signed   By: Franky Crease M.D.   On: 06/13/2024 21:35   US  RENAL Result Date: 05/27/2024 CLINICAL DATA:  [id number] AKI (acute kidney injury) [id number]. EXAM: RENAL / URINARY TRACT ULTRASOUND COMPLETE COMPARISON:  None Available. FINDINGS: Right Kidney: Renal measurements: 4.1 x 5.9 x 11.1 cm = volume: 140 mL. Echogenicity within normal limits. No mass or hydronephrosis visualized. Left Kidney: Renal measurements: 4.3 x 6.2 x 12.2 cm = volume: 170.3 mL. Echogenicity within normal limits. No mass or hydronephrosis visualized. Bladder: Appears normal for degree of bladder distention. Other:  None. IMPRESSION: Unremarkable renal ultrasound examination. Electronically Signed   By: Ree Molt M.D.   On: 05/27/2024 10:20   CT ANGIO HEAD NECK W WO CM (CODE STROKE) Result Date: 05/27/2024 CLINICAL DATA:  Initial evaluation for acute neuro deficit, stroke. EXAM: CT ANGIOGRAPHY HEAD AND NECK WITH AND WITHOUT CONTRAST CT VENOGRAM HEAD TECHNIQUE: Multidetector CT imaging of the head and neck was performed using the standard protocol during bolus administration of intravenous contrast. Multiplanar CT image reconstructions and  MIPs were obtained to evaluate the vascular anatomy. Carotid stenosis measurements (when applicable) are obtained utilizing NASCET criteria, using the distal internal carotid diameter as the denominator. RADIATION DOSE REDUCTION: This exam was performed according to the departmental dose-optimization program which includes automated exposure control, adjustment of the mA and/or kV according to patient size and/or use of iterative reconstruction technique. CONTRAST:  75mL OMNIPAQUE  IOHEXOL  350 MG/ML SOLN COMPARISON:  Prior CT from earlier the same day. FINDINGS: CTA NECK FINDINGS Aortic arch: Visualized aortic arch within normal limits for caliber with standard branch pattern. Aortic atherosclerosis. No visible stenosis about the origin the great vessels. Right carotid system: Right common and internal carotid arteries are patent without dissection. Calcified plaque about the proximal cervical right ICA without hemodynamically significant greater than 50% stenosis. Left carotid system: Left common and internal carotid arteries are patent without dissection. Mild atheromatous change about the left carotid bulb/proximal cervical left ICA without hemodynamically significant greater than 50% stenosis. Vertebral arteries: Both vertebral arteries arise from subclavian arteries. Focal 75% stenosis present at the origin of the right subclavian artery. Atheromatous change about the origins of the vertebral arteries with severe stenosis on the right (series 5, image 268). Vertebral arteries otherwise patent without stenosis or dissection. Skeleton: No worrisome osseous lesions.  Patient is edentulous. Other neck: No other acute finding. Upper chest: No other acute finding. Review of the MIP images confirms the above findings CTA HEAD FINDINGS Anterior circulation: Atheromatous change about the carotid siphons without hemodynamically significant stenosis. A1 segments patent bilaterally. Normal anterior communicating complex.  Anterior cerebral arteries patent without significant stenosis. No M1 stenosis or occlusion. No proximal MCA branch occlusion. Distal MCA branches perfused and symmetric. Posterior circulation: Left V4 segment dominant and widely patent without stenosis. Left PICA patent. Right vertebral artery widely patent to the takeoff of the right PICA of the largely occludes distally. Right PICA patent. Basilar patent without stenosis. Superior cerebellar arteries patent bilaterally. Both PCAs are patent to their distal aspects without hemodynamically significant stenosis. Venous sinuses: See below. Anatomic variants: As above.  No aneurysm. Review of the MIP images confirms the above findings CT VENOGRAM FINDINGS Normal enhancement seen throughout the superior sagittal sinus to the torcula. Transverse and sigmoid sinuses are patent as are the jugular bulbs and proximal internal jugular veins. Straight sinus, vein of Galen, and internal cerebral veins are patent. No evidence for dural venous sinus thrombosis. No appreciable cortical vein abnormality. IMPRESSION: 1. Negative CTA for large vessel occlusion or other emergent finding. 2. Atheromatous change about the origin of the right vertebral artery with severe stenosis. Right V4 segment largely occludes beyond the takeoff of the right PICA. Dominant left vertebral artery widely patent. 3. 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 4. Atheromatous change about the carotid bifurcations and carotid siphons without hemodynamically significant stenosis. 5. Negative CT venogram. No evidence for dural venous sinus thrombosis. Aortic Atherosclerosis (ICD10-I70.0). Electronically Signed   By: Morene Hoard M.D.   On: 05/27/2024 01:28  CT VENOGRAM HEAD Result Date: 05/27/2024 CLINICAL DATA:  Initial evaluation for acute neuro deficit, stroke. EXAM: CT ANGIOGRAPHY HEAD AND NECK WITH AND WITHOUT CONTRAST CT VENOGRAM HEAD TECHNIQUE: Multidetector CT imaging of  the head and neck was performed using the standard protocol during bolus administration of intravenous contrast. Multiplanar CT image reconstructions and MIPs were obtained to evaluate the vascular anatomy. Carotid stenosis measurements (when applicable) are obtained utilizing NASCET criteria, using the distal internal carotid diameter as the denominator. RADIATION DOSE REDUCTION: This exam was performed according to the departmental dose-optimization program which includes automated exposure control, adjustment of the mA and/or kV according to patient size and/or use of iterative reconstruction technique. CONTRAST:  75mL OMNIPAQUE  IOHEXOL  350 MG/ML SOLN COMPARISON:  Prior CT from earlier the same day. FINDINGS: CTA NECK FINDINGS Aortic arch: Visualized aortic arch within normal limits for caliber with standard branch pattern. Aortic atherosclerosis. No visible stenosis about the origin the great vessels. Right carotid system: Right common and internal carotid arteries are patent without dissection. Calcified plaque about the proximal cervical right ICA without hemodynamically significant greater than 50% stenosis. Left carotid system: Left common and internal carotid arteries are patent without dissection. Mild atheromatous change about the left carotid bulb/proximal cervical left ICA without hemodynamically significant greater than 50% stenosis. Vertebral arteries: Both vertebral arteries arise from subclavian arteries. Focal 75% stenosis present at the origin of the right subclavian artery. Atheromatous change about the origins of the vertebral arteries with severe stenosis on the right (series 5, image 268). Vertebral arteries otherwise patent without stenosis or dissection. Skeleton: No worrisome osseous lesions.  Patient is edentulous. Other neck: No other acute finding. Upper chest: No other acute finding. Review of the MIP images confirms the above findings CTA HEAD FINDINGS Anterior circulation:  Atheromatous change about the carotid siphons without hemodynamically significant stenosis. A1 segments patent bilaterally. Normal anterior communicating complex. Anterior cerebral arteries patent without significant stenosis. No M1 stenosis or occlusion. No proximal MCA branch occlusion. Distal MCA branches perfused and symmetric. Posterior circulation: Left V4 segment dominant and widely patent without stenosis. Left PICA patent. Right vertebral artery widely patent to the takeoff of the right PICA of the largely occludes distally. Right PICA patent. Basilar patent without stenosis. Superior cerebellar arteries patent bilaterally. Both PCAs are patent to their distal aspects without hemodynamically significant stenosis. Venous sinuses: See below. Anatomic variants: As above.  No aneurysm. Review of the MIP images confirms the above findings CT VENOGRAM FINDINGS Normal enhancement seen throughout the superior sagittal sinus to the torcula. Transverse and sigmoid sinuses are patent as are the jugular bulbs and proximal internal jugular veins. Straight sinus, vein of Galen, and internal cerebral veins are patent. No evidence for dural venous sinus thrombosis. No appreciable cortical vein abnormality. IMPRESSION: 1. Negative CTA for large vessel occlusion or other emergent finding. 2. Atheromatous change about the origin of the right vertebral artery with severe stenosis. Right V4 segment largely occludes beyond the takeoff of the right PICA. Dominant left vertebral artery widely patent. 3. 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 4. Atheromatous change about the carotid bifurcations and carotid siphons without hemodynamically significant stenosis. 5. Negative CT venogram. No evidence for dural venous sinus thrombosis. Aortic Atherosclerosis (ICD10-I70.0). Electronically Signed   By: Morene Hoard M.D.   On: 05/27/2024 01:28   CT HEAD CODE STROKE WO CONTRAST` Result Date:  05/27/2024 CLINICAL DATA:  Code stroke. Initial evaluation for acute neuro deficit, stroke suspected. EXAM: CT HEAD  WITHOUT CONTRAST TECHNIQUE: Contiguous axial images were obtained from the base of the skull through the vertex without intravenous contrast. RADIATION DOSE REDUCTION: This exam was performed according to the departmental dose-optimization program which includes automated exposure control, adjustment of the mA and/or kV according to patient size and/or use of iterative reconstruction technique. COMPARISON:  Initial evaluation FINDINGS: Brain: Age-related cerebral atrophy with chronic microvascular ischemic disease. Chronic left cerebellar infarct. No acute intracranial hemorrhage. No acute large vessel territory infarct. No mass lesion or midline shift. No hydrocephalus or extra-axial fluid collection. Vascular: No abnormal hyperdense vessel. Scattered vascular calcifications noted within the carotid siphons. Skull: Scalp soft tissues and calvarium demonstrate no new finding. Sinuses/Orbits: Globes orbital soft tissues within normal limits. Paranasal sinuses are largely clear. No significant mastoid effusion. Other: None. ASPECTS [facility] Stroke Program Early CT Score) - Ganglionic level infarction (caudate, lentiform nuclei, internal capsule, insula, M1-M3 cortex): 7 - Supraganglionic infarction (M4-M6 cortex): 3 Total score (0-10 with 10 being normal): 10 IMPRESSION: 1. Stable head CT.  No acute intracranial abnormality. 2. ASPECTS is 10. 3. Age-related cerebral atrophy with chronic small vessel ischemic disease, with chronic left cerebellar infarct. These results were communicated to Dr. Vanessa at 12:25 am on 05/27/2024 by text page via the [facility] messaging system. Electronically Signed   By: Morene Hoard M.D.   On: 05/27/2024 00:25   DG Pelvis Portable Result Date: 05/26/2024 EXAM: 1 VIEW(S) XRAY OF THE PELVIS 05/26/2024 11:12:21 PM COMPARISON: None available. CLINICAL HISTORY: R index  finger infx. FINDINGS: BONES AND JOINTS: Mild degenerative changes of the lower lumbar spine. No acute fracture. No focal osseous lesion. No joint dislocation. SOFT TISSUES: The soft tissues are unremarkable. IMPRESSION: 1. No acute abnormalities. Electronically signed by: Pinkie Pebbles MD 05/26/2024 11:21 PM EDT RP Workstation: [id number]   DG Hand Complete Left Result Date: 05/26/2024 EXAM: 3 or more VIEW(S) XRAY OF THE LEFT HAND 05/26/2024 11:12:21 PM COMPARISON: None available. CLINICAL HISTORY: R index finger infx. R index finger infx. FINDINGS: BONES AND JOINTS: Comminuted, intraarticular fracture involving the base of the second distal phalanx with mild ventral displacement of the dominant distal fracture fragment of the oblique view. Given overlying fragmentation, involvement of the middle phalanx is difficult to exclude, but not favored. Mild multifocal degenerative changes. No joint dislocation. SOFT TISSUES: Associated soft tissue swelling with possible open wound. IMPRESSION: 1. Comminuted intra-articular fracture at the base of the second distal phalanx, as above. 2. Associated soft tissue swelling with possible open wound. Electronically signed by: Pinkie Pebbles MD 05/26/2024 11:21 PM EDT RP Workstation: [id number]   CT Cervical Spine Wo Contrast Result Date: 05/26/2024 CLINICAL DATA:  Neck trauma (Age >= 65y) EXAM: CT CERVICAL SPINE WITHOUT CONTRAST TECHNIQUE: Multidetector CT imaging of the cervical spine was performed without intravenous contrast. Multiplanar CT image reconstructions were also generated. RADIATION DOSE REDUCTION: This exam was performed according to the departmental dose-optimization program which includes automated exposure control, adjustment of the mA and/or kV according to patient size and/or use of iterative reconstruction technique. COMPARISON:  None Available. FINDINGS: Alignment: Normal Skull base and vertebrae: No acute fracture. No primary bone lesion or focal  pathologic process. Soft tissues and spinal canal: No prevertebral fluid or swelling. No visible canal hematoma. Disc levels: Degenerative disc disease most pronounced at C5-6 with disc space narrowing and spurring. Moderate bilateral degenerative facet disease. Upper chest: No acute findings Other: None IMPRESSION: Multilevel degenerative changes.  No acute bony abnormality. Electronically Signed   By: Franky Crease  M.D.   On: 05/26/2024 22:53   CT Head Wo Contrast Result Date: 05/26/2024 CLINICAL DATA:  Head trauma, minor (Age >= 65y) EXAM: CT HEAD WITHOUT CONTRAST TECHNIQUE: Contiguous axial images were obtained from the base of the skull through the vertex without intravenous contrast. RADIATION DOSE REDUCTION: This exam was performed according to the departmental dose-optimization program which includes automated exposure control, adjustment of the mA and/or kV according to patient size and/or use of iterative reconstruction technique. COMPARISON:  04/21/2024 FINDINGS: Brain: There is atrophy and chronic small vessel disease changes. No acute intracranial abnormality. Specifically, no hemorrhage, hydrocephalus, mass lesion, acute infarction, or significant intracranial injury. Old left cerebellar infarct, unchanged. Vascular: No hyperdense vessel or unexpected calcification. Skull: No acute calvarial abnormality. Sinuses/Orbits: No acute findings Other: None IMPRESSION: Atrophy, chronic microvascular disease. No acute intracranial abnormality. Chronic left cerebellar infarct. Electronically Signed   By: Franky Crease M.D.   On: 05/26/2024 22:51   DG Chest Portable 1 View Result Date: 05/26/2024 CLINICAL DATA:  Question of pneumonia. Slight confusion. Patient fell tonight. EXAM: PORTABLE CHEST 1 VIEW COMPARISON:  04/22/2024 FINDINGS: Cardiac enlargement. No vascular congestion or edema. Patient positioning limits evaluation but there appears to be infiltration in the right lower lung probably associated with  some bronchiectasis. This could represent pneumonia or aspiration. Left lung is clear. No pleural effusion or pneumothorax. Mediastinal contours appear intact. Calcification of the aorta. Degenerative changes in the spine and shoulders. IMPRESSION: Cardiac enlargement. Infiltration suggested in the right lower lung with some bronchiectasis. This may be aspiration or pneumonia. Electronically Signed   By: Elsie Gravely M.D.   On: 05/26/2024 22:38    Microbiology: Results for orders placed or performed during the hospital encounter of 06/13/24  Culture, blood (Routine X 2) w Reflex to ID Panel     Status: None (Preliminary result)   Collection Time: 06/15/24 12:50 PM   Specimen: BLOOD RIGHT ARM  Result Value Ref Range Status   Specimen Description   Final    BLOOD RIGHT ARM Performed at [facility], [address] [place], [name] [address]    Special Requests   Final    BOTTLES DRAWN AEROBIC AND ANAEROBIC Blood Culture adequate volume Performed at [facility], [address], [place], [name] [address]    Culture   Final    NO GROWTH 2 DAYS Performed at [facility], [address], [place], [name] [address]    Report Status PENDING  Incomplete  Culture, blood (Routine X 2) w Reflex to ID Panel     Status: None (Preliminary result)   Collection Time: 06/15/24 12:50 PM   Specimen: BLOOD LEFT ARM  Result Value Ref Range Status   Specimen Description   Final    BLOOD LEFT ARM Performed at [facility], [address] [place], [name] [address]    Special Requests   Final    BOTTLES DRAWN AEROBIC ONLY Blood Culture adequate volume Performed at [facility], [address] [place], [name] [address]    Culture   Final    NO GROWTH 2 DAYS Performed at [facility], [address], [place], [name] [address]    Report Status PENDING  Incomplete    Labs: CBC: Recent Labs  Lab 06/13/24 1928  06/14/24 0520 06/15/24 0525 06/16/24 0921  WBC 11.4* 10.3 12.3* 7.9  NEUTROABS 9.3*  --   --   --   HGB 9.9*  9.4* 9.7* 9.7*  HCT 30.4* 29.2* 30.3* 30.5*  MCV 88.9 91.0 92.9 93.6  PLT 202 193 170 154   Basic Metabolic Panel: Recent Labs  Lab 06/13/24 1928 06/14/24 0520 06/15/24 0525 06/16/24 0921  NA 139 134* 138 141  K 3.2* 3.3* 3.9 4.0  CL 101 98 103 107  CO2 26 25 23 26   GLUCOSE 98 140* 131* 112*  BUN 22 20 14 10   CREATININE 1.35* 1.13* 1.03* 1.05*  CALCIUM  8.8* 8.6* 8.9 9.0  MG  --  1.4* 2.1  --   PHOS  --   --  2.4* 3.1   Liver Function Tests: Recent Labs  Lab 06/13/24 1928 06/15/24 0525 06/15/24 1251  AST 34  --  28  ALT 20  --  18  ALKPHOS 112  --  109  BILITOT 1.0  --  1.1  PROT 6.0*  --  5.3*  ALBUMIN 3.8 3.6 3.3*   CBG: Recent Labs  Lab 06/16/24 2002 06/16/24 2331 06/17/24 0348 06/17/24 0740 06/17/24 1114  GLUCAP 115* 119* 122* 108* 129*    Discharge time spent: greater than 30 minutes.  Signed: Owen DELENA Lore, MD [facility] 06/17/2024

## 2024-06-17 NOTE — Plan of Care (Signed)

## 2024-06-17 NOTE — Progress Notes (Signed)
 [facility] Liaison Note  Received request from [facility] manager for family interest in [facility] in [place]. Eligibility confirmed. Visited patient and spoke with family to confirm interest and explain services.   Unfortunately, hospice home is not able to offer a bed today. Hospital liaison will follow up tomorrow or sooner if a room becomes available.   Thank you for allowing us  to participate in this patient's care.  Eleanor Nail, LPN [facility] Liaison [phone]

## 2024-06-18 DIAGNOSIS — E162 Hypoglycemia, unspecified: Secondary | ICD-10-CM | POA: Diagnosis not present

## 2024-06-18 LAB — GLUCOSE, CAPILLARY
Glucose-Capillary: 100 mg/dL — ABNORMAL HIGH (ref 70–99)
Glucose-Capillary: 101 mg/dL — ABNORMAL HIGH (ref 70–99)
Glucose-Capillary: 109 mg/dL — ABNORMAL HIGH (ref 70–99)
Glucose-Capillary: 90 mg/dL (ref 70–99)
Glucose-Capillary: 93 mg/dL (ref 70–99)
Glucose-Capillary: 97 mg/dL (ref 70–99)

## 2024-06-18 MED ORDER — THIAMINE MONONITRATE 100 MG PO TABS
100.0000 mg | ORAL_TABLET | Freq: Every day | ORAL | Status: DC
  Administered 2024-06-20: 100 mg via ORAL
  Filled 2024-06-18: qty 1

## 2024-06-18 MED ORDER — DEXTROSE 5 % IV SOLN: INTRAVENOUS | Status: AC

## 2024-06-18 NOTE — Progress Notes (Signed)
 PROGRESS NOTE    Yvonne Newton  FMW:[id number] DOB: Sep 11, 1940 DOA: 06/13/2024 PCP: [facility]   Brief Narrative: 83 year old with past medical history significant for CVA, heart failure preserved ejection fraction, dementia, diabetes type 2 on metformin and glipizide, CKD 3B, hypertension, hyperlipidemia, gout, osteoarthritis, chronic anemia, GERD recently hospitalized 9/25 through 10/8 for fall /syncope and collapse, progressive worsening dementia with decreased oral intake more somnolent and having hallucination.  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  Awaiting residential hospice.   Assessment & Plan:   Principal Problem:   Hypoglycemia Active Problems:   Essential hypertension   GERD (gastroesophageal reflux disease)   Type 2 diabetes mellitus (HCC)   Hypokalemia   Hypomagnesemia   CKD stage 3b, GFR 30-44 ml/min (HCC)   Dementia with behavioral disturbance (HCC)   1-Diabetes type 2, Hypoglycemia - Patient with poor oral intake.  Hypoglycemia in the setting of systemic agents, and poor oral intake. - Continue IV fluids currently on D5 -Will likely need to discontinue glipizide and metformin at discharge. -no further hypoglycemia.  Plan to discharge to residential hospice.   Acute metabolic encephalopathy Advanced dementia/ -Patient lethargic, keep eyes closed, she will say a few words -Son who was at bedside reports that patient has been declining for months but worse over the last week.  She has not been eating or drinking. - Continue home medication -ammonia level normal, last hospitalization she had a CT that was unrevealing. -B12 was not low. -Palliative care consulted for  goals of care, plan to transition to residential hospice.  Remain with no oral intake, sleepy most of day.   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 -She needs to remain on doxycycline through 11 04/2024. Ortho will follow up on patient.   Mild leukocytosis - UA negative for infection. -blood cultures: No growth to date  Hypokalemia/hypomagnesemia - Replaced  History CVA: - Continue with aspirin  and statin for secondary stroke prevention  Hyperlipidemia - On a statin  Hypertension On Norvasc  and Coreg   Gout -Allopurinol .   Uncontrolled GERD -Protonix  40 mg daily.   Chronic HFpEF -Last 2D echo November 2023 with a EF of 50 to 55%, grade 1 DD, mild aortic regurgitation. -Patient looks more on the dry side.   Chronic anemia -Hemoglobin currently stable at 9.4. -Follow H&H. -Transfusion threshold hemoglobin < 8.   CKD stage IIIb -Stable.       Estimated body mass index is 26.78 kg/m as calculated from the following:   Height as of this encounter: 5' 5 (1.651 m).   Weight as of this encounter: 73 kg.   DVT prophylaxis: Lovenox Code Status: DNR Family Communication: Disposition Plan:  Status is: inpatient.  The patient will require care spanning > 2 midnights and should be moved to inpatient because: Awaiting Residential hospice.     Consultants:  Palliative care  Procedures:  none  Antimicrobials:    Subjective: She had her eyes opens. I offer something to eat and drink and she decline.   Objective: Vitals:   06/16/24 2235 06/17/24 9379 06/17/24 2104 06/18/24 0423  BP: (!) 141/48 (!) 169/56 (!) 141/62 (!) 167/62  Pulse: 81  77 77  Resp:  18 16 18   Temp:  98.6 F (37 C) 98.2 F (36.8 C) 98.9 F (37.2 C)  TempSrc:  Oral Axillary   SpO2:  96% 95% 96%  Weight:      Height:         Intake/Output Summary (Last 24 hours) at 06/18/2024 1359 Last data filed at 06/18/2024 1024 Gross per 24 hour  Intake 1647.84 ml  Output 400 ml  Net 1247.84 ml   Filed Weights   06/13/24 1908  Weight: 73 kg    Examination:  General exam: NAD Respiratory system:  CTA Cardiovascular system: SS 1., S 2 RRR Gastrointestinal system: BS present, soft nt Central nervous system: lethargic Extremities: no edema    Data Reviewed: I have personally reviewed following labs and imaging studies  CBC: Recent Labs  Lab 06/13/24 1928 06/14/24 0520 06/15/24 0525 06/16/24 0921  WBC 11.4* 10.3 12.3* 7.9  NEUTROABS 9.3*  --   --   --   HGB 9.9* 9.4* 9.7* 9.7*  HCT 30.4* 29.2* 30.3* 30.5*  MCV 88.9 91.0 92.9 93.6  PLT 202 193 170 154   Basic Metabolic Panel: Recent Labs  Lab 06/13/24 1928 06/14/24 0520 06/15/24 0525 06/16/24 0921  NA 139 134* 138 141  K 3.2* 3.3* 3.9 4.0  CL 101 98 103 107  CO2 26 25 23 26   GLUCOSE 98 140* 131* 112*  BUN 22 20 14 10   CREATININE 1.35* 1.13* 1.03* 1.05*  CALCIUM  8.8* 8.6* 8.9 9.0  MG  --  1.4* 2.1  --   PHOS  --   --  2.4* 3.1   GFR: Estimated Creatinine Clearance: 40.6 mL/min (A) (by C-G formula based on SCr of 1.05 mg/dL (H)). Liver Function Tests: Recent Labs  Lab 06/13/24 1928 06/15/24 0525 06/15/24 1251  AST 34  --  28  ALT 20  --  18  ALKPHOS 112  --  109  BILITOT 1.0  --  1.1  PROT 6.0*  --  5.3*  ALBUMIN 3.8 3.6 3.3*   No results for input(s): LIPASE, AMYLASE in the last 168 hours. Recent Labs  Lab 06/15/24 1344  AMMONIA 14   Coagulation Profile: No results for input(s): INR, PROTIME in the last 168 hours. Cardiac Enzymes: No results for input(s): CKTOTAL, CKMB, CKMBINDEX, TROPONINI in the last 168 hours. BNP (last 3 results) No results for input(s): PROBNP in the last 8760 hours. HbA1C: No results for input(s): HGBA1C in the last 72 hours. CBG: Recent Labs  Lab 06/17/24 1658  06/17/24 2059 06/18/24 0425 06/18/24 0743 06/18/24 1158  GLUCAP 95 115* 93 97 101*   Lipid Profile: No results for input(s): CHOL, HDL, LDLCALC, TRIG, CHOLHDL, LDLDIRECT in the last 72 hours. Thyroid  Function Tests: No results for input(s): TSH, T4TOTAL, FREET4, T3FREE, THYROIDAB in the last 72 hours. Anemia Panel: No results for input(s): VITAMINB12, FOLATE, FERRITIN, TIBC, IRON, RETICCTPCT in the last 72 hours.  Sepsis Labs: No results for input(s): PROCALCITON, LATICACIDVEN in the last 168 hours.  Recent Results (from the past 240 hours)  Culture, blood (Routine X 2) w Reflex to ID Panel     Status: None (Preliminary result)   Collection Time: 06/15/24 12:50 PM   Specimen: BLOOD RIGHT ARM  Result Value Ref Range Status   Specimen Description   Final    BLOOD RIGHT ARM Performed at [facility], [address] [place], [name] [address]    Special Requests   Final    BOTTLES DRAWN AEROBIC AND ANAEROBIC Blood Culture adequate volume Performed at [facility], [address], [place], [name] [address]    Culture   Final    NO GROWTH 3 DAYS Performed at [facility], [address], [place], [name] [address]    Report Status PENDING  Incomplete  Culture, blood (Routine X 2) w Reflex to ID Panel     Status: None (Preliminary result)   Collection Time: 06/15/24 12:50 PM   Specimen: BLOOD LEFT ARM  Result Value Ref Range Status   Specimen Description   Final    BLOOD LEFT ARM Performed at [facility], [address] [place], [name] [address]    Special Requests   Final    BOTTLES DRAWN AEROBIC ONLY Blood Culture adequate volume Performed at [facility], [address] [place], [name] [address]    Culture   Final    NO GROWTH 3 DAYS Performed at [facility], [address] [place], [name] [address]    Report Status PENDING  Incomplete          Radiology Studies: No results found.       Scheduled Meds:  carvedilol   12.5 mg Oral BID WC   enoxaparin (LOVENOX) injection  40 mg Subcutaneous Q24H   feeding supplement  237 mL Oral BID BM   pantoprazole  (PROTONIX ) IV  40 mg Intravenous QHS   risperiDONE  0.5 mg Oral BID   thiamine   100 mg Oral Daily   Continuous Infusions:  dextrose  75 mL/hr at 06/18/24 1024   doxycycline (VIBRAMYCIN) IV 100 mg (06/18/24 1130)     LOS: 3 days    Time spent: 35 minutes    Ein Rijo A [place], MD [facility]   If 7PM-7AM, please contact night-coverage [email or web address]  06/18/2024, 1:59 PM

## 2024-06-18 NOTE — Progress Notes (Signed)
 Daily Progress Note   Patient Name: Yvonne Newton       Date: 06/18/2024 DOB: [birth year]  Age: 83 y.o. MRN#: [id number] Attending Physician: Madelyne Owen LABOR, MD Primary Care Physician: Cletus Glenn Admit Date: 06/13/2024  Reason for Consultation/Follow-up: Establishing goals of care  Subjective: Not awake not alert now on 6th floor, off mittens On comfort measures Awaiting hospice facility evaluation and transfer  Length of Stay: 3  Current Medications: Scheduled Meds:   carvedilol   12.5 mg Oral BID WC   enoxaparin (LOVENOX) injection  40 mg Subcutaneous Q24H   feeding supplement  237 mL Oral BID BM   pantoprazole  (PROTONIX ) IV  40 mg Intravenous QHS   risperiDONE  0.5 mg Oral BID   thiamine   100 mg Oral Daily    Continuous Infusions:  dextrose      doxycycline (VIBRAMYCIN) IV 100 mg (06/17/24 2200)    PRN Meds: acetaminophen  **OR** acetaminophen , hydrALAZINE, HYDROmorphone (DILAUDID) injection, ondansetron  (ZOFRAN ) IV  Physical Exam         Resting in bed Appears with generalized weakness Shallow regular breath sounds No distress  Vital Signs: BP (!) 167/62 (BP Location: Right Arm)   Pulse 77   Temp 98.9 F (37.2 C)   Resp 18   Ht 5' 5 (1.651 m)   Wt 73 kg   SpO2 96%   BMI 26.78 kg/m  SpO2: SpO2: 96 % O2 Device: O2 Device: Room Air O2 Flow Rate:    Intake/output summary:  Intake/Output Summary (Last 24 hours) at 06/18/2024 1017 Last data filed at 06/18/2024 0244 Gross per 24 hour  Intake 1647.84 ml  Output 400 ml  Net 1247.84 ml   LBM: Last BM Date : 06/18/24 Baseline Weight: Weight: 73 kg Most recent weight: Weight: 73 kg       Palliative Assessment/Data:      Patient Active Problem List   Diagnosis Date Noted    Hypoglycemia 06/14/2024   Hypokalemia 06/14/2024   Hypomagnesemia 06/14/2024   CKD stage 3b, GFR 30-44 ml/min (HCC) 06/14/2024   Dementia with behavioral disturbance (HCC) 06/14/2024   Suppurative tenosynovitis of flexor tendon of left hand 05/28/2024   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) 05/28/2024   Infected finger 05/27/2024   Acute kidney injury superimposed on chronic kidney disease 05/27/2024   Hyperkalemia  05/27/2024   Controlled type 2 diabetes mellitus with hypoglycemia, without long-term current use of insulin  (HCC) 05/27/2024   Acute metabolic encephalopathy 05/27/2024   Fall at home, initial encounter 05/27/2024   Metabolic acidosis 05/27/2024   Lactic acidosis 05/27/2024   Normocytic anemia 05/27/2024   Gout 05/27/2024   GERD (gastroesophageal reflux disease) 05/27/2024   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   Coronary artery disease involving native coronary artery of native heart with unstable angina pectoris (HCC) 07/12/2022   NSTEMI (non-ST elevated myocardial infarction) (HCC) 07/11/2022   History of cerebellar stroke 2019 07/11/2022   History of ventricular tachycardia 07/11/2022   Chest pain 07/11/2022   Dementia without behavioral disturbance (HCC) 07/11/2022   Acute respiratory failure with hypoxia (HCC) 07/11/2022   Acute on chronic diastolic CHF (congestive heart failure) (HCC) 07/11/2022   Cerebellar stroke (HCC)    Dysarthria    Diabetes mellitus type 2 in nonobese (HCC)    Allergy 07/22/2017   Arthritis 07/22/2017   Type 2 diabetes mellitus (HCC) 07/22/2017   Hyperlipidemia, unspecified 07/22/2017   Essential hypertension 07/22/2017   VT (ventricular tachycardia) (HCC) 07/22/2017   OBESITY 09/05/2009   VENTRICULAR TACHYCARDIA 09/05/2009    Palliative Care Assessment & Plan   Patient Profile:    Assessment:  84 y.o. female  with past medical history of CVA, HFpEF (EF 50% in 2023), dementia, CKD IIIB,  admitted on 06/13/2024 with  hypoglycemia. Palliative consulted for goals of care conversation.     Patient previously seen by palliative on 06/05/2024  during previous hospitalization for progressive dementia FTT and hallucinations. Living will states that she does not want prolonged aggressive interventions. Previous discharge plan was for palliative outpatient with [facility] to follow along with SNF.    Hypoglycemia improved after D10 administration. CT A/P no acute findings, UA not suggestive of UTI. Some hypokalemia and improving creatinine 1.05 from 1.8. Continues to have poor PO intake. Continues doxycyline for MSSA for left finger s/p partial amputation.    Continues Respirdal and zoloft .   Recommendations/Plan:  Discontinue PO medications no longer important, reduce pill burden Patient with essentially nil PO intake Residential hospice evaluation Add IV Dilaudid PRN.  Comfort measures.   Goals of Care and Additional Recommendations: Limitations on Scope of Treatment: No Artificial Feeding  Code Status: now DNR DNI comfort care.     Code Status Orders  (From admission, onward)           Start     Ordered   06/14/24 0111  Do not attempt resuscitation (DNR)- Limited -Do Not Intubate (DNI)  Continuous       Question Answer Comment  If pulseless and not breathing No CPR or chest compressions.   In Pre-Arrest Conditions (Patient Is Breathing and Has A Pulse) Do not intubate. Provide all appropriate non-invasive medical interventions. Avoid ICU transfer unless indicated or required.   Consent: Discussion documented in EHR or advanced directives reviewed      06/14/24 0112           Code Status History     Date Active Date Inactive Code Status Order ID Comments User Context   06/05/2024 0932 06/08/2024 2032 Do not attempt resuscitation (DNR) - Comfort care [id number]  Esequiel Rosaline GRADE, NP Inpatient   05/27/2024 0947 06/05/2024 0932 Full Code [id number]  Claudene Maximino LABOR, MD ED   07/11/2022 2043  07/15/2022 1823 Full Code [id number]  Cleatus Delayne GAILS, MD ED   11/02/2017 0412 11/05/2017 2107 Full Code [id number]  Jonel Bruckner  P, MD Inpatient       Prognosis:  < 2 weeks  Discharge Planning: Hospice facility  Care plan was discussed with son Koren on the phone on 06-17-24.   Thank you for allowing the Palliative Medicine Team to assist in the care of this patient.   Mod MDM     Greater than 50%  of this time was spent counseling and coordinating care related to the above assessment and plan.  Lonia Serve, MD  Please contact Palliative Medicine Team phone at [phone] for questions and concerns.

## 2024-06-18 NOTE — TOC Progression Note (Signed)
 Transition of Care [facility]) - Progression Note    Patient Details  Name: Yvonne Newton MRN: [id number] Date of Birth: Nov 29, 1940  Transition of Care [facility]) CM/SW Contact  Sonda Manuella Quill, RN Phone Number: 06/18/2024, 12:02 PM  Clinical Narrative:    Notified by Nat Babe, [facility] for [facility], no beds available at [facility]; she will notify IP CM when bed becomes available.   Expected Discharge Plan: Hospice Medical Facility Barriers to Discharge: No Barriers Identified               Expected Discharge Plan and Services   Discharge Planning Services: CM Consult Post Acute Care Choice: Residential Hospice Bed   Expected Discharge Date: 06/17/24                                     Social Drivers of Health (SDOH) Interventions SDOH Screenings   Food Insecurity: Patient Declined (06/14/2024)  Housing: Unknown (06/14/2024)  Transportation Needs: Patient Declined (06/14/2024)  Utilities: Patient Declined (06/14/2024)  Financial Resource Strain: Low Risk  (05/03/2024)   Received from [facility]  Social Connections: Unknown (06/14/2024)  Tobacco Use: Medium Risk (06/13/2024)    Readmission Risk Interventions     No data to display

## 2024-06-18 NOTE — Progress Notes (Signed)
 Refusing oral morning medications. MD Regalado made aware.

## 2024-06-18 NOTE — Progress Notes (Signed)
 Pt. transferred to 1618. Pt stable VS WNL, no concerns voiced at this time. All needs met, report given at bedside, bed linen changed and pt cleaned up.

## 2024-06-18 NOTE — Progress Notes (Signed)
 [facility]18 [facility] Liaison Note   Received previous request from [facility] for family interest in [facility] in [place]. Eligibility has been confirmed.     Unfortunately, Hospice Home is not able to offer a bed today. Hospital Liaison will follow up tomorrow or sooner if a room becomes available.    Thank you for allowing us  to participate in this patient's care.   Nat Babe, BSN, RN [employer] [phone]

## 2024-06-19 DIAGNOSIS — E162 Hypoglycemia, unspecified: Secondary | ICD-10-CM | POA: Diagnosis not present

## 2024-06-19 LAB — GLUCOSE, CAPILLARY
Glucose-Capillary: 114 mg/dL — ABNORMAL HIGH (ref 70–99)
Glucose-Capillary: 126 mg/dL — ABNORMAL HIGH (ref 70–99)
Glucose-Capillary: 99 mg/dL (ref 70–99)
Glucose-Capillary: 107 mg/dL — ABNORMAL HIGH (ref 70–99)
Glucose-Capillary: 93 mg/dL (ref 70–99)

## 2024-06-19 MED ORDER — HALOPERIDOL LACTATE 5 MG/ML IJ SOLN
3.0000 mg | Freq: Four times a day (QID) | INTRAMUSCULAR | Status: DC | PRN
  Administered 2024-06-23: 3 mg via INTRAVENOUS
  Filled 2024-06-19: qty 1

## 2024-06-19 NOTE — Progress Notes (Signed)
 Daily Progress Note   Patient Name: Yvonne Newton       Date: 06/19/2024 DOB: 10-03-1940  Age: 83 y.o. MRN#: [id number] Attending Physician: Madelyne Owen LABOR, MD Primary Care Physician: [facility] Admit Date: 06/13/2024  Reason for Consultation/Follow-up: Establishing goals of care  Subjective: Not awake not alert   On comfort measures Awaiting hospice facility evaluation and transfer 0.5 mg IV Dilaudid given earlier this am.   Length of Stay: 4  Current Medications: Scheduled Meds:   carvedilol   12.5 mg Oral BID WC   enoxaparin (LOVENOX) injection  40 mg Subcutaneous Q24H   feeding supplement  237 mL Oral BID BM   pantoprazole  (PROTONIX ) IV  40 mg Intravenous QHS   risperiDONE  0.5 mg Oral BID   thiamine   100 mg Oral Daily    Continuous Infusions:  dextrose  75 mL/hr at 06/19/24 0500   doxycycline (VIBRAMYCIN) IV Stopped (06/19/24 0004)    PRN Meds: acetaminophen  **OR** acetaminophen , hydrALAZINE, HYDROmorphone (DILAUDID) injection, ondansetron  (ZOFRAN ) IV  Physical Exam         Resting in bed Appears with generalized weakness Shallow regular breath sounds No distress One of the upper extremity is in a dressing, stirs some when name is called, but doesn't awaken, doesn't verbalize  Vital Signs: BP (!) 151/82 (BP Location: Right Arm)   Pulse 89   Temp 98.7 F (37.1 C) (Oral)   Resp 16   Ht 5' 5 (1.651 m)   Wt 73 kg   SpO2 98%   BMI 26.78 kg/m  SpO2: SpO2: 98 % O2 Device: O2 Device: Room Air O2 Flow Rate:    Intake/output summary:  Intake/Output Summary (Last 24 hours) at 06/19/2024 0917 Last data filed at 06/19/2024 0500 Gross per 24 hour  Intake 1588.26 ml  Output 550 ml  Net 1038.26 ml   LBM: Last BM Date : 06/18/24 Baseline  Weight: Weight: 73 kg Most recent weight: Weight: 73 kg       Palliative Assessment/Data:      Patient Active Problem List   Diagnosis Date Noted   Hypoglycemia 06/14/2024   Hypokalemia 06/14/2024   Hypomagnesemia 06/14/2024   CKD stage 3b, GFR 30-44 ml/min (HCC) 06/14/2024   Dementia with behavioral disturbance (HCC) 06/14/2024   Suppurative tenosynovitis of flexor tendon of left hand 05/28/2024  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) 05/28/2024   Infected finger 05/27/2024   Acute kidney injury superimposed on chronic kidney disease 05/27/2024   Hyperkalemia 05/27/2024   Controlled type 2 diabetes mellitus with hypoglycemia, without long-term current use of insulin  (HCC) 05/27/2024   Acute metabolic encephalopathy 05/27/2024   Fall at home, initial encounter 05/27/2024   Metabolic acidosis 05/27/2024   Lactic acidosis 05/27/2024   Normocytic anemia 05/27/2024   Gout 05/27/2024   GERD (gastroesophageal reflux disease) 05/27/2024   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   Coronary artery disease involving native coronary artery of native heart with unstable angina pectoris (HCC) 07/12/2022   NSTEMI (non-ST elevated myocardial infarction) (HCC) 07/11/2022   History of cerebellar stroke 2019 07/11/2022   History of ventricular tachycardia 07/11/2022   Chest pain 07/11/2022   Dementia without behavioral disturbance (HCC) 07/11/2022   Acute respiratory failure with hypoxia (HCC) 07/11/2022   Acute on chronic diastolic CHF (congestive heart failure) (HCC) 07/11/2022   Cerebellar stroke (HCC)    Dysarthria    Diabetes mellitus type 2 in nonobese (HCC)    Allergy 07/22/2017   Arthritis 07/22/2017   Type 2 diabetes mellitus (HCC) 07/22/2017   Hyperlipidemia, unspecified 07/22/2017   Essential hypertension 07/22/2017   VT (ventricular tachycardia) (HCC) 07/22/2017   OBESITY 09/05/2009   VENTRICULAR TACHYCARDIA 09/05/2009    Palliative Care Assessment & Plan    Patient Profile:    Assessment:  83 y.o. female  with past medical history of CVA, HFpEF (EF 50% in 2023), dementia, CKD IIIB,  admitted on 06/13/2024 with hypoglycemia. Palliative consulted for goals of care conversation.     Patient previously seen by palliative on 06/05/2024  during previous hospitalization for progressive dementia FTT and hallucinations. Living will states that she does not want prolonged aggressive interventions. Previous discharge plan was for palliative outpatient with [facility] to follow along with SNF.    Hypoglycemia improved after D10 administration. CT A/P no acute findings, UA not suggestive of UTI. Some hypokalemia and improving creatinine 1.05 from 1.8. Continues to have poor PO intake. Continues doxycyline for MSSA for left finger s/p partial amputation.    Continues Respirdal and zoloft .   Recommendations/Plan: Off of PO medications no longer important for comfort measures.  Patient with essentially nil PO intake Residential hospice evaluation   IV Dilaudid PRN.  Comfort measures.   Goals of Care and Additional Recommendations: Limitations on Scope of Treatment: No Artificial Feeding  Code Status: now DNR DNI comfort care.     Code Status Orders  (From admission, onward)           Start     Ordered   06/14/24 0111  Do not attempt resuscitation (DNR)- Limited -Do Not Intubate (DNI)  Continuous       Question Answer Comment  If pulseless and not breathing No CPR or chest compressions.   In Pre-Arrest Conditions (Patient Is Breathing and Has A Pulse) Do not intubate. Provide all appropriate non-invasive medical interventions. Avoid ICU transfer unless indicated or required.   Consent: Discussion documented in EHR or advanced directives reviewed      06/14/24 0112           Code Status History     Date Active Date Inactive Code Status Order ID Comments User Context   06/05/2024 0932 06/08/2024 2032 Do not attempt resuscitation (DNR) -  Comfort care [id number]  Esequiel Rosaline GRADE, NP Inpatient   05/27/2024 0947 06/05/2024 0932 Full Code [id number]  Claudene Maximino LABOR,  MD ED   07/11/2022 2043 07/15/2022 1823 Full Code [id number]  Cleatus Delayne GAILS, MD ED   11/02/2017 0412 11/05/2017 2107 Full Code [id number]  Danford, Lonni SQUIBB, MD Inpatient       Prognosis:  < 2 weeks  Discharge Planning: Hospice facility  Care plan was discussed with son Koren on the phone on 06-17-24.   Thank you for allowing the Palliative Medicine Team to assist in the care of this patient.   Mod MDM     Greater than 50%  of this time was spent counseling and coordinating care related to the above assessment and plan.  Lonia Serve, MD  Please contact Palliative Medicine Team phone at [phone] for questions and concerns.

## 2024-06-19 NOTE — Progress Notes (Signed)
 PT Cancellation Note  Patient Details Name: Yvonne Newton MRN: [id number] DOB: 1941/07/01   Cancelled Treatment:    Reason Eval/Treat Not Completed:  Pt is now comfort care with plans for residential hospice. Will sign off.    Dannial SQUIBB, PT Acute Rehabilitation  Office: [phone]

## 2024-06-19 NOTE — Progress Notes (Signed)
 PROGRESS NOTE    Yvonne Newton  FMW:[id number] DOB: 1941-01-26 DOA: 06/13/2024 PCP: [facility]   Brief Narrative: 83 year old with past medical history significant for CVA, heart failure preserved ejection fraction, dementia, diabetes type 2 on metformin and glipizide, CKD 3B, hypertension, hyperlipidemia, gout, osteoarthritis, chronic anemia, GERD recently hospitalized 9/25 through 10/8 for fall /syncope and collapse, progressive worsening dementia with decreased oral intake more somnolent and having hallucination.  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  Awaiting residential hospice.   Assessment & Plan:   Principal Problem:   Hypoglycemia Active Problems:   Essential hypertension   GERD (gastroesophageal reflux disease)   Type 2 diabetes mellitus (HCC)   Hypokalemia   Hypomagnesemia   CKD stage 3b, GFR 30-44 ml/min (HCC)   Dementia with behavioral disturbance (HCC)   1-Diabetes type 2, Hypoglycemia - Patient with poor oral intake.  Hypoglycemia in the setting of systemic agents, and poor oral intake. - Continue IV fluids currently on D5 -Will likely need to discontinue glipizide and metformin at discharge. -no further hypoglycemia.  Plan to discharge to residential hospice.   Acute metabolic encephalopathy Advanced dementia/ -Patient lethargic, keep eyes closed, she will say a few words -Son who was at bedside reports that patient has been declining for months but worse over the last week.  She has not been eating or drinking. - Continue home medication -ammonia level normal, last hospitalization she had a CT that was unrevealing. -B12 was not low. -Palliative care consulted for  goals of care, plan to transition to residential hospice.  Remain with no oral intake, sleepy most of day.   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 -She needs to remain on doxycycline through 11 04/2024. Ortho will follow up on patient.   Mild leukocytosis - UA negative for infection. -blood cultures: No growth to date  Hypokalemia/hypomagnesemia - Replaced  History CVA: - Continue with aspirin  and statin for secondary stroke prevention  Hyperlipidemia - On a statin  Hypertension On Norvasc  and Coreg   Gout -Allopurinol .   Uncontrolled GERD -Protonix  40 mg daily.   Chronic HFpEF -Last 2D echo November 2023 with a EF of 50 to 55%, grade 1 DD, mild aortic regurgitation. -Patient looks more on the dry side.   Chronic anemia -Hemoglobin currently stable at 9.4. -Follow H&H. -Transfusion threshold hemoglobin < 8.   CKD stage IIIb -Stable.       Estimated body mass index is 26.78 kg/m as calculated from the following:   Height as of this encounter: 5' 5 (1.651 m).   Weight as of this encounter: 73 kg.   DVT prophylaxis: Lovenox Code Status: DNR Family Communication: Disposition Plan:  Status is: inpatient.  The patient will require care spanning > 2 midnights and should be moved to inpatient because: Awaiting Residential hospice.     Consultants:  Palliative care  Procedures:  none  Antimicrobials:    Subjective: Alert this am, refuse oral intake.   Objective: Vitals:   06/17/24 0620 06/17/24 2104 06/18/24 0423 06/19/24 0426  BP: (!) 169/56 (!) 141/62 (!) 167/62 ROLLEN)  151/82  Pulse:  77 77 89  Resp: 18 16 18 16   Temp: 98.6 F (37 C) 98.2 F (36.8 C) 98.9 F (37.2 C) 98.7 F (37.1 C)  TempSrc: Oral Axillary  Oral  SpO2: 96% 95% 96% 98%  Weight:      Height:        Intake/Output  Summary (Last 24 hours) at 06/19/2024 1353 Last data filed at 06/19/2024 0500 Gross per 24 hour  Intake 1588.26 ml  Output 550 ml  Net 1038.26 ml   Filed Weights   06/13/24 1908  Weight: 73 kg    Examination: General exam: NAD Respiratory system:  CTA Cardiovascular system: S 1, S 2 RRR Gastrointestinal system: BS present, soft nt Central nervous system:alert Extremities: no edema    Data Reviewed: I have personally reviewed following labs and imaging studies  CBC: Recent Labs  Lab 06/13/24 1928 06/14/24 0520 06/15/24 0525 06/16/24 0921  WBC 11.4* 10.3 12.3* 7.9  NEUTROABS 9.3*  --   --   --   HGB 9.9* 9.4* 9.7* 9.7*  HCT 30.4* 29.2* 30.3* 30.5*  MCV 88.9 91.0 92.9 93.6  PLT 202 193 170 154   Basic Metabolic Panel: Recent Labs  Lab 06/13/24 1928 06/14/24 0520 06/15/24 0525 06/16/24 0921  NA 139 134* 138 141  K 3.2* 3.3* 3.9 4.0  CL 101 98 103 107  CO2 26 25 23 26   GLUCOSE 98 140* 131* 112*  BUN 22 20 14 10   CREATININE 1.35* 1.13* 1.03* 1.05*  CALCIUM  8.8* 8.6* 8.9 9.0  MG  --  1.4* 2.1  --   PHOS  --   --  2.4* 3.1   GFR: Estimated Creatinine Clearance: 40.6 mL/min (A) (by C-G formula based on SCr of 1.05 mg/dL (H)). Liver Function Tests: Recent Labs  Lab 06/13/24 1928 06/15/24 0525 06/15/24 1251  AST 34  --  28  ALT 20  --  18  ALKPHOS 112  --  109  BILITOT 1.0  --  1.1  PROT 6.0*  --  5.3*  ALBUMIN 3.8 3.6 3.3*   No results for input(s): LIPASE, AMYLASE in the last 168 hours. Recent Labs  Lab 06/15/24 1344  AMMONIA 14   Coagulation Profile: No results for input(s): INR, PROTIME in the last 168 hours. Cardiac Enzymes: No results for input(s): CKTOTAL, CKMB, CKMBINDEX, TROPONINI in the last 168 hours. BNP (last 3 results) No results for input(s): PROBNP in the last 8760 hours. HbA1C: No results for input(s): HGBA1C in the last 72 hours. CBG: Recent Labs  Lab 06/18/24 2013 06/18/24 2352 06/19/24 0413  06/19/24 0741 06/19/24 1215  GLUCAP 109* 90 114* 126* 99   Lipid Profile: No results for input(s): CHOL, HDL, LDLCALC, TRIG, CHOLHDL, LDLDIRECT in the last 72 hours. Thyroid  Function Tests: No results for input(s): TSH, T4TOTAL, FREET4, T3FREE, THYROIDAB in the last 72 hours. Anemia Panel: No results for input(s): VITAMINB12, FOLATE, FERRITIN, TIBC, IRON, RETICCTPCT in the last 72 hours.  Sepsis Labs: No results for input(s): PROCALCITON, LATICACIDVEN in the last 168 hours.  Recent Results (from the past 240 hours)  Culture, blood (Routine X 2) w Reflex to ID Panel     Status: None (Preliminary result)   Collection Time: 06/15/24 12:50 PM   Specimen: BLOOD RIGHT ARM  Result Value Ref Range Status   Specimen Description   Final    BLOOD RIGHT ARM Performed at [facility], [address], [place], [name] [address]  Special Requests   Final    BOTTLES DRAWN AEROBIC AND ANAEROBIC Blood Culture adequate volume Performed at [facility], [address], [place], [name] [address]    Culture   Final    NO GROWTH 4 DAYS Performed at [facility], [address] [place], [name] [address]    Report Status PENDING  Incomplete  Culture, blood (Routine X 2) w Reflex to ID Panel     Status: None (Preliminary result)   Collection Time: 06/15/24 12:50 PM   Specimen: BLOOD LEFT ARM  Result Value Ref Range Status   Specimen Description   Final    BLOOD LEFT ARM Performed at [facility], [address], [place], [name] [address]    Special Requests   Final    BOTTLES DRAWN AEROBIC ONLY Blood Culture adequate volume Performed at [facility], [address] [place], [name] [address]    Culture   Final    NO GROWTH 4 DAYS Performed at [facility], [address], [place], [name] [address]    Report Status PENDING  Incomplete         Radiology Studies: No  results found.       Scheduled Meds:  carvedilol   12.5 mg Oral BID WC   enoxaparin (LOVENOX) injection  40 mg Subcutaneous Q24H   feeding supplement  237 mL Oral BID BM   pantoprazole  (PROTONIX ) IV  40 mg Intravenous QHS   risperiDONE  0.5 mg Oral BID   thiamine   100 mg Oral Daily   Continuous Infusions:  doxycycline (VIBRAMYCIN) IV 100 mg (06/19/24 0932)     LOS: 4 days    Time spent: 35 minutes    Enrika Aguado A Zyire Eidson, MD [facility]   If 7PM-7AM, please contact night-coverage [email or web address]  06/19/2024, 1:53 PM

## 2024-06-19 NOTE — TOC Progression Note (Addendum)
 Transition of Care [facility]) - Progression Note    Patient Details  Name: Nidia Grogan MRN: [id number] Date of Birth: [birth year]  [facility]) CM/SW Contact  Sonda Manuella Quill, RN Phone Number: 06/19/2024, 1:29 PM  Clinical Narrative:    Notified by Nat Babe, [facility] Liaison, no bed available at [facility] today; IP CM will be notified when bed becomes available; Dr Madelyne notified via secure chat.   Expected Discharge Plan: Hospice Medical Facility Barriers to Discharge: No Barriers Identified               Expected Discharge Plan and Services   Discharge Planning Services: CM Consult Post Acute Care Choice: Residential Hospice Bed   Expected Discharge Date: 06/17/24                                     Social Drivers of Health (SDOH) Interventions SDOH Screenings   Food Insecurity: Patient Declined (06/14/2024)  Housing: Unknown (06/14/2024)  Transportation Needs: Patient Declined (06/14/2024)  Utilities: Patient Declined (06/14/2024)  Financial Resource Strain: Low Risk  (05/03/2024)   Received from [facility]  Social Connections: Unknown (06/14/2024)  Tobacco Use: Medium Risk (06/13/2024)    Readmission Risk Interventions     No data to display

## 2024-06-19 NOTE — Plan of Care (Signed)

## 2024-06-20 DIAGNOSIS — E162 Hypoglycemia, unspecified: Secondary | ICD-10-CM | POA: Diagnosis not present

## 2024-06-20 LAB — CULTURE, BLOOD (ROUTINE X 2)
Culture: NO GROWTH
Culture: NO GROWTH
Special Requests: ADEQUATE
Special Requests: ADEQUATE

## 2024-06-20 LAB — GLUCOSE, CAPILLARY
Glucose-Capillary: 108 mg/dL — ABNORMAL HIGH (ref 70–99)
Glucose-Capillary: 125 mg/dL — ABNORMAL HIGH (ref 70–99)
Glucose-Capillary: 88 mg/dL (ref 70–99)
Glucose-Capillary: 88 mg/dL (ref 70–99)
Glucose-Capillary: 88 mg/dL (ref 70–99)
Glucose-Capillary: 97 mg/dL (ref 70–99)

## 2024-06-20 NOTE — Progress Notes (Signed)
 Patient ID: Yvonne Newton, female   DOB: 1940/12/30, 83 y.o.   MRN: [id number]   LOS: 5 days   Subjective: Confused but more alert.   Objective: Vital signs in last 24 hours: Temp:  [98.4 F (36.9 C)-99.8 F (37.7 C)] 98.4 F (36.9 C) (10/20 0521) Pulse Rate:  [80-94] 87 (10/20 1123) Resp:  [18] 18 (10/20 0521) BP: (132-157)/(54-86) 151/86 (10/20 1123) SpO2:  [95 %-96 %] 96 % (10/20 0521) Last BM Date : 06/18/24    Physical Exam General appearance: alert and no distress Left hand -- Incisions C/D/I   Assessment/Plan: S/p I&D left hand -- D/C sutures.    [name], PA-C Orthopedic Surgery [phone] 06/20/2024

## 2024-06-20 NOTE — Progress Notes (Signed)
 [facility] Liaison Note   Received previous request from [facility] for family interest in [facility] in [place]. Eligibility has been confirmed.     Unfortunately, Hospice Home is not able to offer a bed today. Hospital Liaison will follow up tomorrow or sooner if a room becomes available.    Thank you for allowing us  to participate in this patient's care.   Eleanor Nail, LPN [facility] Liaison [phone]

## 2024-06-20 NOTE — TOC Progression Note (Signed)
 Transition of Care [facility]) - Progression Note    Patient Details  Name: Yvonne Newton MRN: [id number] Date of Birth: Jan 06, 1941  [facility] CM/SW Contact  Yvonne LITTIE Agar, RN Phone Number:[phone]  06/20/2024, 10:56 AM  Clinical Narrative:    CM received message from Yvonne Newton in the CM office. Per Yvonne Newton son inquiring about status of his mothers bed at residential office. CM has called son Yvonne Newton and reiterated that [facility] continues to follow for placement and that there is no bed availability in [place] or [facility] at this time. Son verbalized understanding. CM has informed son that [facility] and CM will notify him once bed is available.    Expected Discharge Plan: Hospice Medical Facility Barriers to Discharge: No Barriers Identified               Expected Discharge Plan and Services   Discharge Planning Services: CM Consult Post Acute Care Choice: Residential Hospice Bed   Expected Discharge Date: 06/17/24                                     Social Drivers of Health (SDOH) Interventions SDOH Screenings   Food Insecurity: Patient Declined (06/14/2024)  Housing: Unknown (06/14/2024)  Transportation Needs: Patient Declined (06/14/2024)  Utilities: Patient Declined (06/14/2024)  Financial Resource Strain: Low Risk  (05/03/2024)   Received from [facility]  Social Connections: Unknown (06/14/2024)  Tobacco Use: Medium Risk (06/13/2024)    Readmission Risk Interventions     No data to display

## 2024-06-20 NOTE — Plan of Care (Signed)
  Problem: Clinical Measurements: Goal: Will remain free from infection Outcome: Progressing Goal: Diagnostic test results will improve Outcome: Progressing   Problem: Pain Managment: Goal: General experience of comfort will improve and/or be controlled Outcome: Progressing   Problem: Safety: Goal: Ability to remain free from injury will improve Outcome: Progressing   Problem: Skin Integrity: Goal: Risk for impaired skin integrity will decrease Outcome: Progressing

## 2024-06-20 NOTE — Progress Notes (Signed)
 Daily Progress Note   Patient Name: Yvonne Newton       Date: 06/20/2024 DOB: [birth year]  Age: 83 y.o. MRN#: [id number] Attending Physician: Madelyne Owen LABOR, MD Primary Care Physician: [facility] Admit Date: 06/13/2024  Reason for Consultation/Follow-up: Establishing goals of care  Subjective: Not awake not alert   On comfort measures Awaiting hospice facility evaluation and transfer 0.5 mg IV Dilaudid PRN pain.   Length of Stay: 5  Current Medications: Scheduled Meds:   carvedilol   12.5 mg Oral BID WC   enoxaparin (LOVENOX) injection  40 mg Subcutaneous Q24H   feeding supplement  237 mL Oral BID BM   pantoprazole  (PROTONIX ) IV  40 mg Intravenous QHS   risperiDONE  0.5 mg Oral BID   thiamine   100 mg Oral Daily    Continuous Infusions:  doxycycline (VIBRAMYCIN) IV 100 mg (06/19/24 2149)    PRN Meds: acetaminophen  **OR** acetaminophen , haloperidol lactate, hydrALAZINE, HYDROmorphone (DILAUDID) injection, ondansetron  (ZOFRAN ) IV  Physical Exam         Resting in bed Appears with generalized weakness Shallow regular breath sounds No distress One of the upper extremity is in a dressing, stirs some when name is called, but doesn't awaken, doesn't verbalize  Vital Signs: BP (!) 132/54 (BP Location: Left Arm)   Pulse 88   Temp 98.4 F (36.9 C) (Oral)   Resp 18   Ht 5' 5 (1.651 m)   Wt 73 kg   SpO2 96%   BMI 26.78 kg/m  SpO2: SpO2: 96 % O2 Device: O2 Device: Room Air O2 Flow Rate:    Intake/output summary:  Intake/Output Summary (Last 24 hours) at 06/20/2024 1026 Last data filed at 06/20/2024 0600 Gross per 24 hour  Intake 250 ml  Output 900 ml  Net -650 ml   LBM: Last BM Date : 06/18/24 Baseline Weight: Weight: 73 kg Most recent weight:  Weight: 73 kg       Palliative Assessment/Data:      Patient Active Problem List   Diagnosis Date Noted   Hypoglycemia 06/14/2024   Hypokalemia 06/14/2024   Hypomagnesemia 06/14/2024   CKD stage 3b, GFR 30-44 ml/min (HCC) 06/14/2024   Dementia with behavioral disturbance (HCC) 06/14/2024   Suppurative tenosynovitis of flexor tendon of left hand 05/28/2024   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) 05/28/2024   Infected finger 05/27/2024   Acute kidney injury superimposed on chronic kidney disease 05/27/2024   Hyperkalemia 05/27/2024   Controlled type 2 diabetes mellitus with hypoglycemia, without long-term current use of insulin  (HCC) 05/27/2024   Acute metabolic encephalopathy 05/27/2024   Fall at home, initial encounter 05/27/2024   Metabolic acidosis 05/27/2024   Lactic acidosis 05/27/2024   Normocytic anemia 05/27/2024   Gout 05/27/2024   GERD (gastroesophageal reflux disease) 05/27/2024   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   Coronary artery disease involving native coronary artery of native heart with unstable angina pectoris (HCC) 07/12/2022   NSTEMI (non-ST elevated myocardial infarction) (HCC) 07/11/2022   History of cerebellar stroke 2019 07/11/2022   History of ventricular tachycardia 07/11/2022   Chest pain 07/11/2022   Dementia without behavioral disturbance (HCC) 07/11/2022   Acute respiratory failure with hypoxia (HCC) 07/11/2022   Acute on chronic diastolic CHF (congestive heart failure) (HCC) 07/11/2022   Cerebellar stroke (HCC)    Dysarthria    Diabetes mellitus type 2 in nonobese (HCC)    Allergy 07/22/2017   Arthritis 07/22/2017   Type 2 diabetes mellitus (HCC) 07/22/2017   Hyperlipidemia, unspecified 07/22/2017   Essential hypertension 07/22/2017   VT (ventricular tachycardia) (HCC) 07/22/2017   OBESITY 09/05/2009   VENTRICULAR TACHYCARDIA 09/05/2009    Palliative Care Assessment & Plan   Patient Profile:    Assessment:  83 y.o.  female  with past medical history of CVA, HFpEF (EF 50% in 2023), dementia, CKD IIIB,  admitted on 06/13/2024 with hypoglycemia. Palliative consulted for goals of care conversation.     Patient previously seen by palliative on 06/05/2024  during previous hospitalization for progressive dementia FTT and hallucinations. Living will states that she does not want prolonged aggressive interventions. Previous discharge plan was for palliative outpatient with [facility] to follow along with SNF.    Hypoglycemia improved after D10 administration. CT A/P no acute findings, UA not suggestive of UTI. Some hypokalemia and improving creatinine 1.05 from 1.8. Continues to have poor PO intake. Continues doxycyline for MSSA for left finger s/p partial amputation.    Continues Respirdal and zoloft .   Recommendations/Plan: Off of PO medications no longer important for comfort measures.  Patient with essentially nil PO intake Residential hospice evaluation - awaiting bed availability, TOC note reviewed.    IV Dilaudid PRN.  Comfort measures.   Goals of Care and Additional Recommendations: Limitations on Scope of Treatment: No Artificial Feeding  Code Status: now DNR DNI comfort care.     Code Status Orders  (From admission, onward)           Start     Ordered   06/14/24 0111  Do not attempt resuscitation (DNR)- Limited -Do Not Intubate (DNI)  Continuous       Question Answer Comment  If pulseless and not breathing No CPR or chest compressions.   In Pre-Arrest Conditions (Patient Is Breathing and Has A Pulse) Do not intubate. Provide all appropriate non-invasive medical interventions. Avoid ICU transfer unless indicated or required.   Consent: Discussion documented in EHR or advanced directives reviewed      06/14/24 0112           Code Status History     Date Active Date Inactive Code Status Order ID Comments User Context   06/05/2024 0932 06/08/2024 2032 Do not attempt resuscitation (DNR) -  Comfort care [id number]  Esequiel Rosaline GRADE, NP Inpatient   05/27/2024 0947 06/05/2024 0932 Full Code [id number]  Claudene,  Maximino LABOR, MD ED   07/11/2022 2043 07/15/2022 1823 Full Code [id number]  Cleatus Delayne GAILS, MD ED   11/02/2017 [phone] 11/05/2017 2107 Full Code [id number]  Danford, Lonni SQUIBB, MD Inpatient       Prognosis:  < 2 weeks  Discharge Planning: Hospice facility  Care plan was discussed with IDT, discussed with RN.   Thank you for allowing the Palliative Medicine Team to assist in the care of this patient.   Mod MDM     Greater than 50%  of this time was spent counseling and coordinating care related to the above assessment and plan.  Lonia Serve, MD  Please contact Palliative Medicine Team phone at [phone] for questions and concerns.

## 2024-06-20 NOTE — Progress Notes (Signed)
 PROGRESS NOTE    Yvonne Newton  FMW:[id number] DOB: 1941/03/13 DOA: 06/13/2024 PCP: [facility]   Brief Narrative: 83 year old with past medical history significant for CVA, heart failure preserved ejection fraction, dementia, diabetes type 2 on metformin and glipizide, CKD 3B, hypertension, hyperlipidemia, gout, osteoarthritis, chronic anemia, GERD recently hospitalized 9/25 through 10/8 for fall /syncope and collapse, progressive worsening dementia with decreased oral intake more somnolent and having hallucination.  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  Awaiting residential hospice.   Assessment & Plan:   Principal Problem:   Hypoglycemia Active Problems:   Essential hypertension   GERD (gastroesophageal reflux disease)   Type 2 diabetes mellitus (HCC)   Hypokalemia   Hypomagnesemia   CKD stage 3b, GFR 30-44 ml/min (HCC)   Dementia with behavioral disturbance (HCC)   1-Diabetes type 2, Hypoglycemia - Patient with poor oral intake.  Hypoglycemia in the setting of systemic agents, and poor oral intake. -treated with  IV fluids currently on D5 -Will likely need to discontinue glipizide and metformin at discharge. -no further hypoglycemia.  Plan to discharge to residential hospice.   Acute metabolic encephalopathy Advanced dementia/ -Patient lethargic, keep eyes closed, she will say a few words -Son who was at bedside reports that patient has been declining for months but worse over the last week.  She has not been eating or drinking. - Continue home medication -ammonia level normal, last hospitalization she had a CT that was unrevealing. -B12 was not low. -Palliative care consulted for  goals of care, plan to transition to residential hospice.  Remain with no oral intake, sleepy most of day.   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 -She needs to remain on doxycycline through 11 04/2024. -suture remove today   Mild leukocytosis - UA negative for infection. -blood cultures: No growth to date  Hypokalemia/hypomagnesemia - Replaced  History CVA: - Continue with aspirin  and statin for secondary stroke prevention  Hyperlipidemia - On a statin  Hypertension On Norvasc  and Coreg   Gout -Allopurinol .   Uncontrolled GERD -Protonix  40 mg daily.   Chronic HFpEF -Last 2D echo November 2023 with a EF of 50 to 55%, grade 1 DD, mild aortic regurgitation. -Patient looks more on the dry side.   Chronic anemia -Hemoglobin currently stable at 9.4. -Follow H&H. -Transfusion threshold hemoglobin < 8.   CKD stage IIIb -Stable.       Estimated body mass index is 26.78 kg/m as calculated from the following:   Height as of this encounter: 5' 5 (1.651 m).   Weight as of this encounter: 73 kg.   DVT prophylaxis: Lovenox Code Status: DNR Family Communication: Disposition Plan:  Status is: inpatient.  The patient will require care spanning > 2 midnights and should be moved to inpatient because: Awaiting Residential hospice.     Consultants:  Palliative care  Procedures:  none  Antimicrobials:    Subjective: No oral intake, sleepy   Objective: Vitals:   06/19/24 1532 06/19/24 2000 06/20/24 0521 06/20/24 1123  BP: (!) 157/58 (!) 148/58 (!) 132/54 (!) 151/86  Pulse: 80  94 88 87  Resp: 18 18 18    Temp: 99.8 F (37.7 C) 98.5 F (36.9 C) 98.4 F (36.9 C)   TempSrc: Oral Oral Oral   SpO2: 95% 95% 96%   Weight:      Height:        Intake/Output Summary (Last 24 hours) at 06/20/2024 1246 Last  data filed at 06/20/2024 0600 Gross per 24 hour  Intake 250 ml  Output 900 ml  Net -650 ml   Filed Weights   06/13/24 1908  Weight: 73 kg    Examination: General exam: NAD Respiratory system:  CTA Cardiovascular system: SS 1, S 2 RRR Gastrointestinal system: BS present, soft, nt Central nervous system: Sleepy Extremities: no edema    Data Reviewed: I have personally reviewed following labs and imaging studies  CBC: Recent Labs  Lab 06/13/24 1928 06/14/24 0520 06/15/24 0525 06/16/24 0921  WBC 11.4* 10.3 12.3* 7.9  NEUTROABS 9.3*  --   --   --   HGB 9.9* 9.4* 9.7* 9.7*  HCT 30.4* 29.2* 30.3* 30.5*  MCV 88.9 91.0 92.9 93.6  PLT 202 193 170 154   Basic Metabolic Panel: Recent Labs  Lab 06/13/24 1928 06/14/24 0520 06/15/24 0525 06/16/24 0921  NA 139 134* 138 141  K 3.2* 3.3* 3.9 4.0  CL 101 98 103 107  CO2 26 25 23 26   GLUCOSE 98 140* 131* 112*  BUN 22 20 14 10   CREATININE 1.35* 1.13* 1.03* 1.05*  CALCIUM  8.8* 8.6* 8.9 9.0  MG  --  1.4* 2.1  --   PHOS  --   --  2.4* 3.1   GFR: Estimated Creatinine Clearance: 40.6 mL/min (A) (by C-G formula based on SCr of 1.05 mg/dL (H)). Liver Function Tests: Recent Labs  Lab 06/13/24 1928 06/15/24 0525 06/15/24 1251  AST 34  --  28  ALT 20  --  18  ALKPHOS 112  --  109  BILITOT 1.0  --  1.1  PROT 6.0*  --  5.3*  ALBUMIN 3.8 3.6 3.3*   No results for input(s): LIPASE, AMYLASE in the last 168 hours. Recent Labs  Lab 06/15/24 1344  AMMONIA 14   Coagulation Profile: No results for input(s): INR, PROTIME in the last 168 hours. Cardiac Enzymes: No results for input(s): CKTOTAL, CKMB, CKMBINDEX, TROPONINI in the last 168 hours. BNP (last 3 results) No results for input(s): PROBNP in the last 8760 hours. HbA1C: No results for input(s): HGBA1C in the last 72 hours. CBG: Recent Labs  Lab 06/19/24 2023 06/20/24 0016 06/20/24 0428 06/20/24 0814 06/20/24 1128  GLUCAP 93 88 97 88 88    Lipid Profile: No results for input(s): CHOL, HDL, LDLCALC, TRIG, CHOLHDL, LDLDIRECT in the last 72 hours. Thyroid  Function Tests: No results for input(s): TSH, T4TOTAL, FREET4, T3FREE, THYROIDAB in the last 72 hours. Anemia Panel: No results for input(s): VITAMINB12, FOLATE, FERRITIN, TIBC, IRON, RETICCTPCT in the last 72 hours.  Sepsis Labs: No results for input(s): PROCALCITON, LATICACIDVEN in the last 168 hours.  Recent Results (from the past 240 hours)  Culture, blood (Routine X 2) w Reflex to ID Panel     Status: None   Collection Time: 06/15/24 12:50 PM   Specimen: BLOOD RIGHT ARM  Result Value Ref Range Status   Specimen Description   Final    BLOOD RIGHT ARM Performed at [facility], [address] [place], [name] [address]    Special Requests   Final  BOTTLES DRAWN AEROBIC AND ANAEROBIC Blood Culture adequate volume Performed at [facility], [address] [place], [name] [address]    Culture   Final    NO GROWTH 5 DAYS Performed at [facility], [address], [place], [name] [address]    Report Status 06/20/2024 FINAL  Final  Culture, blood (Routine X 2) w Reflex to ID Panel     Status: None   Collection Time: 06/15/24 12:50 PM   Specimen: BLOOD LEFT ARM  Result Value Ref Range Status   Specimen Description   Final    BLOOD LEFT ARM Performed at [facility], [address], [place], [name] [address]    Special Requests   Final    BOTTLES DRAWN AEROBIC ONLY Blood Culture adequate volume Performed at [facility], [address] [place], [name] [address]    Culture   Final    NO GROWTH 5 DAYS Performed at [facility], [address], [place], [name] [address]    Report Status 06/20/2024 FINAL  Final         Radiology Studies: No results found.       Scheduled Meds:  carvedilol   12.5 mg Oral BID WC   enoxaparin  (LOVENOX) injection  40 mg Subcutaneous Q24H   feeding supplement  237 mL Oral BID BM   pantoprazole  (PROTONIX ) IV  40 mg Intravenous QHS   risperiDONE  0.5 mg Oral BID   thiamine   100 mg Oral Daily   Continuous Infusions:  doxycycline (VIBRAMYCIN) IV 100 mg (06/20/24 1216)     LOS: 5 days    Time spent: 35 minutes    Emanuelle Hammerstrom A Lillie Bollig, MD [facility]   If 7PM-7AM, please contact night-coverage [email or web address]  06/20/2024, 12:46 PM

## 2024-06-20 NOTE — Plan of Care (Signed)
   Problem: Education: Goal: Knowledge of General Education information will improve Description: Including pain rating scale, medication(s)/side effects and non-pharmacologic comfort measures Outcome: Progressing   Problem: Pain Managment: Goal: General experience of comfort will improve and/or be controlled Outcome: Progressing   Problem: Safety: Goal: Ability to remain free from injury will improve Outcome: Progressing

## 2024-06-21 DIAGNOSIS — E162 Hypoglycemia, unspecified: Secondary | ICD-10-CM | POA: Diagnosis not present

## 2024-06-21 LAB — GLUCOSE, CAPILLARY
Glucose-Capillary: 104 mg/dL — ABNORMAL HIGH (ref 70–99)
Glucose-Capillary: 106 mg/dL — ABNORMAL HIGH (ref 70–99)
Glucose-Capillary: 109 mg/dL — ABNORMAL HIGH (ref 70–99)
Glucose-Capillary: 82 mg/dL (ref 70–99)
Glucose-Capillary: 99 mg/dL (ref 70–99)

## 2024-06-21 MED ORDER — HYDROMORPHONE HCL 1 MG/ML IJ SOLN
0.5000 mg | INTRAMUSCULAR | Status: DC | PRN
Start: 2024-06-21 — End: 2024-06-24
  Administered 2024-06-23 – 2024-06-24 (×2): 0.5 mg via INTRAVENOUS
  Filled 2024-06-21 (×2): qty 0.5

## 2024-06-21 MED ORDER — ORAL CARE MOUTH RINSE: 15.0000 mL | OROMUCOSAL | Status: DC | PRN

## 2024-06-21 MED ORDER — DEXTROSE 5 % IV SOLN: INTRAVENOUS | Status: DC

## 2024-06-21 NOTE — Progress Notes (Signed)
 [facility] Liaison Note   Received previous request from [facility] for family interest in [facility] in [place]. Eligibility has been confirmed.     Unfortunately, Hospice Home is not able to offer a bed today. Hospital Liaison will follow up tomorrow or sooner if a room becomes available.    Thank you for allowing us  to participate in this patient's care.   Eleanor Nail, LPN [facility] Liaison [phone]

## 2024-06-21 NOTE — TOC Progression Note (Signed)
 Transition of Care [facility]) - Progression Note    Patient Details  Name: Yvonne Newton MRN: [id number] Date of Birth: July 22, 1941  Transition of Care [facility]) CM/SW Contact  Heather DELENA Saltness, LCSW Phone Number: 06/21/2024, 12:46 PM  Clinical Narrative:    Pt currently waiting for bed to become available at [facility] in [place]. [facility] following. Palliative care team following. Pt is now comfort care. TOC will continue to follow.   Expected Discharge Plan: Hospice Medical Facility Barriers to Discharge: Hospice Bed not available   Expected Discharge Plan and Services   Discharge Planning Services: CM Consult Post Acute Care Choice: Residential Hospice Bed   Expected Discharge Date: 06/17/24               DME Arranged: N/A Social Drivers of Health (SDOH) Interventions SDOH Screenings   Food Insecurity: Patient Declined (06/14/2024)  Housing: Unknown (06/14/2024)  Transportation Needs: Patient Declined (06/14/2024)  Utilities: Patient Declined (06/14/2024)  Financial Resource Strain: Low Risk  (05/03/2024)   Received from [facility]  Social Connections: Unknown (06/14/2024)  Tobacco Use: Medium Risk (06/13/2024)    Readmission Risk Interventions    06/21/2024   12:45 PM  Readmission Risk Prevention Plan  Transportation Screening Complete  PCP or Specialist Appt within 3-5 Days Complete  HRI or Home Care Consult Complete  Social Work Consult for Recovery Care Planning/Counseling Complete  Palliative Care Screening Complete  Medication Review Oceanographer) Complete    Signed: Heather Saltness, MSW, LCSW Clinical Social Worker Inpatient Care Management 06/21/2024 12:46 PM

## 2024-06-21 NOTE — Progress Notes (Signed)
 OT Cancellation Note  Patient Details Name: Shantinique Picazo MRN: [id number] DOB: 1940/11/10   Cancelled Treatment:    Reason Eval/Treat Not Completed: Other (comment) (Patient has transitioned to comfort measures.  OT to sign off.)  Belvie B. Rahma Meller, MS, OTR/L  06/21/2024, 10:44 AM

## 2024-06-21 NOTE — Plan of Care (Signed)
   Problem: Nutrition: Goal: Adequate nutrition will be maintained Outcome: Progressing   Problem: Pain Managment: Goal: General experience of comfort will improve and/or be controlled Outcome: Progressing   Problem: Safety: Goal: Ability to remain free from injury will improve Outcome: Progressing

## 2024-06-21 NOTE — Progress Notes (Signed)
 Sutures were removed from the left hand, 7 from the left index stump and 12 from the palmar area. After removal, the suture sites were covered with Steri-Strips. The patient tolerated well, and the wound edges appeared approximated.

## 2024-06-21 NOTE — Discharge Summary (Signed)
 Physician Discharge Summary   Patient: Yvonne Newton MRN: [id number] DOB: 1941/03/20  Admit date:     06/13/2024  Discharge date:   Discharge Physician: Owen DELENA Lore   PCP: [facility]   Recommendations at discharge:    Patient to be transfer to residential Hospice.   Discharge Diagnoses: Principal Problem:   Hypoglycemia Active Problems:   Essential hypertension   GERD (gastroesophageal reflux disease)   Type 2 diabetes mellitus (HCC)   Hypokalemia   Hypomagnesemia   CKD stage 3b, GFR 30-44 ml/min (HCC)   Dementia with behavioral disturbance (HCC)  Resolved Problems:   * No resolved hospital problems. *  Hospital Course: 83 year old with past medical history significant for CVA, heart failure preserved ejection fraction, dementia, diabetes type 2 on metformin and glipizide, CKD 3B, hypertension, hyperlipidemia, gout, osteoarthritis, chronic anemia, GERD recently hospitalized 9/25 through 10/8 for fall /syncope and collapse, progressive worsening dementia with decreased oral intake more somnolent and having hallucination. 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   She was treated with IV fluids, IV antibiotics. She continue to be lethargic, not taking orals. Family discussed with palliative care, plan to transition to residential Hospice care.   Assessment and Plan: 1-Diabetes type 2, Hypoglycemia - Patient with poor oral intake.  Hypoglycemia in the setting of systemic agents, and poor oral intake. - Continue IV fluids currently on D5 -Discontinue glipizide and metformin at discharge. -no further hypoglycemia.    Acute metabolic encephalopathy Advanced dementia/ -Patient  lethargic, keep eyes closed, she will say a few words -Son who was at bedside reports that patient has been declining for months but worse over the last week.  She has not been eating or drinking. - Continue home medication -ammonia level normal, last hospitalization she had a CT that was unrevealing. -B12 was not low. -Palliative care consulted for goals of care, plan to transition to residential hospice.  Continue to be sleepy. No interactive, no oral intake.    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 -She needs to remain on doxycycline through 11 04/2024. Dressing changes, suture remove on Monday    Mild leukocytosis - UA negative for infection. -blood cultures: No growth to date   Hypokalemia/hypomagnesemia - Replaced   History CVA: - Continue with aspirin  and statin for secondary stroke prevention   Hyperlipidemia - DC statins, transition to comfort care.    Hypertension On  Coreg    Gout -discontinue Allopurinol .   Uncontrolled GERD -Protonix  40 mg daily.   Chronic HFpEF -Last 2D echo November 2023 with a EF of 50 to 55%, grade 1 DD, mild aortic regurgitation.    Chronic anemia -Hemoglobin currently stable at 9.4. -Follow H&H.    CKD stage IIIb -Stable.           Consultants: Palliative care Procedures performed: none Disposition: Residential Hospice Diet recommendation:  Discharge Diet Orders (From admission, onward)     Start     Ordered   06/17/24 0000  Diet general        06/17/24 1327  Regular diet DISCHARGE MEDICATION: Allergies as of 06/21/2024       Reactions   Blueberry Flavoring Agent (non-screening) Hives, Shortness Of Breath   Influenza Vaccines    Penicillins Itching   Sulfa Antibiotics Itching   Sulfonamide Derivatives Itching   Other Swelling, Rash   METAL         Medication List     STOP taking these medications    allopurinol  300 MG tablet Commonly known as: ZYLOPRIM    amLODipine  5 MG tablet Commonly known as: NORVASC    aspirin  EC 81 MG tablet   colchicine  0.6 MG tablet   cyanocobalamin  1000 MCG tablet Commonly known as: VITAMIN B12   diltiazem  120 MG 24 hr capsule Commonly known as: CARDIZEM  CD   doxycycline 100 MG tablet Commonly known as: VIBRA-TABS   feeding supplement Liqd   furosemide  40 MG tablet Commonly known as: LASIX    losartan  25 MG tablet Commonly known as: COZAAR    metFORMIN 500 MG tablet Commonly known as: GLUCOPHAGE   potassium chloride  SA 20 MEQ tablet Commonly known as: KLOR-CON  M   risperiDONE 0.5 MG tablet Commonly known as: RISPERDAL   rosuvastatin  5 MG tablet Commonly known as: Crestor    sertraline  25 MG tablet Commonly known as: ZOLOFT        TAKE these medications    carvedilol  12.5 MG tablet Commonly known as: COREG  Take 1 tablet (12.5 mg total) by mouth 2 (two) times daily with a meal.   pantoprazole  20 MG tablet Commonly known as: PROTONIX  Take 20 mg by mouth daily.         Discharge Exam: Filed Weights   06/13/24 1908  Weight: 73 kg   General; Sleepy, no distress  Condition at discharge: poor  The results of significant diagnostics from this hospitalization (including imaging, microbiology, ancillary and laboratory) are listed below for reference.   Imaging Studies: CT ABDOMEN PELVIS W CONTRAST Result Date: 06/13/2024 CLINICAL DATA:  Abdominal pain EXAM: CT ABDOMEN AND PELVIS WITH CONTRAST TECHNIQUE: Multidetector CT imaging of the abdomen and pelvis was performed using the standard protocol following bolus administration of intravenous contrast. RADIATION DOSE REDUCTION: This exam was performed according to the departmental dose-optimization program which includes automated exposure control, adjustment of the mA and/or kV according to patient size and/or  use of iterative reconstruction technique. CONTRAST:  80mL OMNIPAQUE  IOHEXOL  300 MG/ML  SOLN COMPARISON:  04/21/2024 FINDINGS: Lower chest: No acute abnormality. Hepatobiliary: No focal liver abnormality is seen. Status post cholecystectomy. No biliary dilatation. Pancreas: No focal abnormality or ductal dilatation. Spleen: No focal abnormality.  Normal size. Adrenals/Urinary Tract: No adrenal abnormality. No focal renal abnormality. No stones or hydronephrosis. Urinary bladder is unremarkable. Stomach/Bowel: Normal appendix. Stomach, large and small bowel grossly unremarkable. Vascular/Lymphatic: Aortic atherosclerosis. No evidence of aneurysm or adenopathy. Reproductive: Prior hysterectomy.  No adnexal masses. Other: No free fluid or free air. Musculoskeletal: No acute bony abnormality. IMPRESSION: No acute findings in the abdomen or pelvis. Aortic atherosclerosis. Electronically Signed   By: Franky Crease M.D.   On: 06/13/2024 21:35   US  RENAL Result Date: 05/27/2024 CLINICAL DATA:  [id number] AKI (acute kidney injury) [id number]. EXAM: RENAL / URINARY TRACT ULTRASOUND COMPLETE COMPARISON:  None Available. FINDINGS: Right Kidney: Renal measurements: 4.1 x 5.9 x 11.1 cm = volume: 140 mL. Echogenicity within normal limits. No mass or hydronephrosis visualized. Left Kidney: Renal measurements: 4.3 x 6.2 x 12.2 cm = volume: 170.3 mL. Echogenicity within normal limits. No mass or hydronephrosis visualized. Bladder: Appears normal  for degree of bladder distention. Other: None. IMPRESSION: Unremarkable renal ultrasound examination. Electronically Signed   By: Ree Molt M.D.   On: 05/27/2024 10:20   CT ANGIO HEAD NECK W WO CM (CODE STROKE) Result Date: 05/27/2024 CLINICAL DATA:  Initial evaluation for acute neuro deficit, stroke. EXAM: CT ANGIOGRAPHY HEAD AND NECK WITH AND WITHOUT CONTRAST CT VENOGRAM HEAD TECHNIQUE: Multidetector CT imaging of the head and neck was performed using the standard protocol during bolus  administration of intravenous contrast. Multiplanar CT image reconstructions and MIPs were obtained to evaluate the vascular anatomy. Carotid stenosis measurements (when applicable) are obtained utilizing NASCET criteria, using the distal internal carotid diameter as the denominator. RADIATION DOSE REDUCTION: This exam was performed according to the departmental dose-optimization program which includes automated exposure control, adjustment of the mA and/or kV according to patient size and/or use of iterative reconstruction technique. CONTRAST:  75mL OMNIPAQUE  IOHEXOL  350 MG/ML SOLN COMPARISON:  Prior CT from earlier the same day. FINDINGS: CTA NECK FINDINGS Aortic arch: Visualized aortic arch within normal limits for caliber with standard branch pattern. Aortic atherosclerosis. No visible stenosis about the origin the great vessels. Right carotid system: Right common and internal carotid arteries are patent without dissection. Calcified plaque about the proximal cervical right ICA without hemodynamically significant greater than 50% stenosis. Left carotid system: Left common and internal carotid arteries are patent without dissection. Mild atheromatous change about the left carotid bulb/proximal cervical left ICA without hemodynamically significant greater than 50% stenosis. Vertebral arteries: Both vertebral arteries arise from subclavian arteries. Focal 75% stenosis present at the origin of the right subclavian artery. Atheromatous change about the origins of the vertebral arteries with severe stenosis on the right (series 5, image 268). Vertebral arteries otherwise patent without stenosis or dissection. Skeleton: No worrisome osseous lesions.  Patient is edentulous. Other neck: No other acute finding. Upper chest: No other acute finding. Review of the MIP images confirms the above findings CTA HEAD FINDINGS Anterior circulation: Atheromatous change about the carotid siphons without hemodynamically significant  stenosis. A1 segments patent bilaterally. Normal anterior communicating complex. Anterior cerebral arteries patent without significant stenosis. No M1 stenosis or occlusion. No proximal MCA branch occlusion. Distal MCA branches perfused and symmetric. Posterior circulation: Left V4 segment dominant and widely patent without stenosis. Left PICA patent. Right vertebral artery widely patent to the takeoff of the right PICA of the largely occludes distally. Right PICA patent. Basilar patent without stenosis. Superior cerebellar arteries patent bilaterally. Both PCAs are patent to their distal aspects without hemodynamically significant stenosis. Venous sinuses: See below. Anatomic variants: As above.  No aneurysm. Review of the MIP images confirms the above findings CT VENOGRAM FINDINGS Normal enhancement seen throughout the superior sagittal sinus to the torcula. Transverse and sigmoid sinuses are patent as are the jugular bulbs and proximal internal jugular veins. Straight sinus, vein of Galen, and internal cerebral veins are patent. No evidence for dural venous sinus thrombosis. No appreciable cortical vein abnormality. IMPRESSION: 1. Negative CTA for large vessel occlusion or other emergent finding. 2. Atheromatous change about the origin of the right vertebral artery with severe stenosis. Right V4 segment largely occludes beyond the takeoff of the right PICA. Dominant left vertebral artery widely patent. 3. 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 4. Atheromatous change about the carotid bifurcations and carotid siphons without hemodynamically significant stenosis. 5. Negative CT venogram. No evidence for dural venous sinus thrombosis. Aortic Atherosclerosis (ICD10-I70.0). Electronically Signed   By: Morene Hoard  M.D.   On: 05/27/2024 01:28   CT VENOGRAM HEAD Result Date: 05/27/2024 CLINICAL DATA:  Initial evaluation for acute neuro deficit, stroke. EXAM: CT ANGIOGRAPHY HEAD AND NECK  WITH AND WITHOUT CONTRAST CT VENOGRAM HEAD TECHNIQUE: Multidetector CT imaging of the head and neck was performed using the standard protocol during bolus administration of intravenous contrast. Multiplanar CT image reconstructions and MIPs were obtained to evaluate the vascular anatomy. Carotid stenosis measurements (when applicable) are obtained utilizing NASCET criteria, using the distal internal carotid diameter as the denominator. RADIATION DOSE REDUCTION: This exam was performed according to the departmental dose-optimization program which includes automated exposure control, adjustment of the mA and/or kV according to patient size and/or use of iterative reconstruction technique. CONTRAST:  75mL OMNIPAQUE  IOHEXOL  350 MG/ML SOLN COMPARISON:  Prior CT from earlier the same day. FINDINGS: CTA NECK FINDINGS Aortic arch: Visualized aortic arch within normal limits for caliber with standard branch pattern. Aortic atherosclerosis. No visible stenosis about the origin the great vessels. Right carotid system: Right common and internal carotid arteries are patent without dissection. Calcified plaque about the proximal cervical right ICA without hemodynamically significant greater than 50% stenosis. Left carotid system: Left common and internal carotid arteries are patent without dissection. Mild atheromatous change about the left carotid bulb/proximal cervical left ICA without hemodynamically significant greater than 50% stenosis. Vertebral arteries: Both vertebral arteries arise from subclavian arteries. Focal 75% stenosis present at the origin of the right subclavian artery. Atheromatous change about the origins of the vertebral arteries with severe stenosis on the right (series 5, image 268). Vertebral arteries otherwise patent without stenosis or dissection. Skeleton: No worrisome osseous lesions.  Patient is edentulous. Other neck: No other acute finding. Upper chest: No other acute finding. Review of the MIP  images confirms the above findings CTA HEAD FINDINGS Anterior circulation: Atheromatous change about the carotid siphons without hemodynamically significant stenosis. A1 segments patent bilaterally. Normal anterior communicating complex. Anterior cerebral arteries patent without significant stenosis. No M1 stenosis or occlusion. No proximal MCA branch occlusion. Distal MCA branches perfused and symmetric. Posterior circulation: Left V4 segment dominant and widely patent without stenosis. Left PICA patent. Right vertebral artery widely patent to the takeoff of the right PICA of the largely occludes distally. Right PICA patent. Basilar patent without stenosis. Superior cerebellar arteries patent bilaterally. Both PCAs are patent to their distal aspects without hemodynamically significant stenosis. Venous sinuses: See below. Anatomic variants: As above.  No aneurysm. Review of the MIP images confirms the above findings CT VENOGRAM FINDINGS Normal enhancement seen throughout the superior sagittal sinus to the torcula. Transverse and sigmoid sinuses are patent as are the jugular bulbs and proximal internal jugular veins. Straight sinus, vein of Galen, and internal cerebral veins are patent. No evidence for dural venous sinus thrombosis. No appreciable cortical vein abnormality. IMPRESSION: 1. Negative CTA for large vessel occlusion or other emergent finding. 2. Atheromatous change about the origin of the right vertebral artery with severe stenosis. Right V4 segment largely occludes beyond the takeoff of the right PICA. Dominant left vertebral artery widely patent. 3. 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 4. Atheromatous change about the carotid bifurcations and carotid siphons without hemodynamically significant stenosis. 5. Negative CT venogram. No evidence for dural venous sinus thrombosis. Aortic Atherosclerosis (ICD10-I70.0). Electronically Signed   By: Morene Hoard M.D.   On: 05/27/2024  01:28   CT HEAD CODE STROKE WO CONTRAST` Result Date: 05/27/2024 CLINICAL DATA:  Code stroke. Initial evaluation for  acute neuro deficit, stroke suspected. EXAM: CT HEAD WITHOUT CONTRAST TECHNIQUE: Contiguous axial images were obtained from the base of the skull through the vertex without intravenous contrast. RADIATION DOSE REDUCTION: This exam was performed according to the departmental dose-optimization program which includes automated exposure control, adjustment of the mA and/or kV according to patient size and/or use of iterative reconstruction technique. COMPARISON:  Initial evaluation FINDINGS: Brain: Age-related cerebral atrophy with chronic microvascular ischemic disease. Chronic left cerebellar infarct. No acute intracranial hemorrhage. No acute large vessel territory infarct. No mass lesion or midline shift. No hydrocephalus or extra-axial fluid collection. Vascular: No abnormal hyperdense vessel. Scattered vascular calcifications noted within the carotid siphons. Skull: Scalp soft tissues and calvarium demonstrate no new finding. Sinuses/Orbits: Globes orbital soft tissues within normal limits. Paranasal sinuses are largely clear. No significant mastoid effusion. Other: None. ASPECTS [facility] Stroke Program Early CT Score) - Ganglionic level infarction (caudate, lentiform nuclei, internal capsule, insula, M1-M3 cortex): 7 - Supraganglionic infarction (M4-M6 cortex): 3 Total score (0-10 with 10 being normal): 10 IMPRESSION: 1. Stable head CT.  No acute intracranial abnormality. 2. ASPECTS is 10. 3. Age-related cerebral atrophy with chronic small vessel ischemic disease, with chronic left cerebellar infarct. These results were communicated to Dr. Vanessa at 12:25 am on 05/27/2024 by text page via the [facility] messaging system. Electronically Signed   By: Morene Hoard M.D.   On: 05/27/2024 00:25   DG Pelvis Portable Result Date: 05/26/2024 EXAM: 1 VIEW(S) XRAY OF THE PELVIS 05/26/2024 11:12:21  PM COMPARISON: None available. CLINICAL HISTORY: R index finger infx. FINDINGS: BONES AND JOINTS: Mild degenerative changes of the lower lumbar spine. No acute fracture. No focal osseous lesion. No joint dislocation. SOFT TISSUES: The soft tissues are unremarkable. IMPRESSION: 1. No acute abnormalities. Electronically signed by: Pinkie Pebbles MD 05/26/2024 11:21 PM EDT RP Workstation: [id number]   DG Hand Complete Left Result Date: 05/26/2024 EXAM: 3 or more VIEW(S) XRAY OF THE LEFT HAND 05/26/2024 11:12:21 PM COMPARISON: None available. CLINICAL HISTORY: R index finger infx. R index finger infx. FINDINGS: BONES AND JOINTS: Comminuted, intraarticular fracture involving the base of the second distal phalanx with mild ventral displacement of the dominant distal fracture fragment of the oblique view. Given overlying fragmentation, involvement of the middle phalanx is difficult to exclude, but not favored. Mild multifocal degenerative changes. No joint dislocation. SOFT TISSUES: Associated soft tissue swelling with possible open wound. IMPRESSION: 1. Comminuted intra-articular fracture at the base of the second distal phalanx, as above. 2. Associated soft tissue swelling with possible open wound. Electronically signed by: Pinkie Pebbles MD 05/26/2024 11:21 PM EDT RP Workstation: [id number]   CT Cervical Spine Wo Contrast Result Date: 05/26/2024 CLINICAL DATA:  Neck trauma (Age >= 65y) EXAM: CT CERVICAL SPINE WITHOUT CONTRAST TECHNIQUE: Multidetector CT imaging of the cervical spine was performed without intravenous contrast. Multiplanar CT image reconstructions were also generated. RADIATION DOSE REDUCTION: This exam was performed according to the departmental dose-optimization program which includes automated exposure control, adjustment of the mA and/or kV according to patient size and/or use of iterative reconstruction technique. COMPARISON:  None Available. FINDINGS: Alignment: Normal Skull base and  vertebrae: No acute fracture. No primary bone lesion or focal pathologic process. Soft tissues and spinal canal: No prevertebral fluid or swelling. No visible canal hematoma. Disc levels: Degenerative disc disease most pronounced at C5-6 with disc space narrowing and spurring. Moderate bilateral degenerative facet disease. Upper chest: No acute findings Other: None IMPRESSION: Multilevel degenerative changes.  No acute bony abnormality.  Electronically Signed   By: Franky Crease M.D.   On: 05/26/2024 22:53   CT Head Wo Contrast Result Date: 05/26/2024 CLINICAL DATA:  Head trauma, minor (Age >= 65y) EXAM: CT HEAD WITHOUT CONTRAST TECHNIQUE: Contiguous axial images were obtained from the base of the skull through the vertex without intravenous contrast. RADIATION DOSE REDUCTION: This exam was performed according to the departmental dose-optimization program which includes automated exposure control, adjustment of the mA and/or kV according to patient size and/or use of iterative reconstruction technique. COMPARISON:  04/21/2024 FINDINGS: Brain: There is atrophy and chronic small vessel disease changes. No acute intracranial abnormality. Specifically, no hemorrhage, hydrocephalus, mass lesion, acute infarction, or significant intracranial injury. Old left cerebellar infarct, unchanged. Vascular: No hyperdense vessel or unexpected calcification. Skull: No acute calvarial abnormality. Sinuses/Orbits: No acute findings Other: None IMPRESSION: Atrophy, chronic microvascular disease. No acute intracranial abnormality. Chronic left cerebellar infarct. Electronically Signed   By: Franky Crease M.D.   On: 05/26/2024 22:51   DG Chest Portable 1 View Result Date: 05/26/2024 CLINICAL DATA:  Question of pneumonia. Slight confusion. Patient fell tonight. EXAM: PORTABLE CHEST 1 VIEW COMPARISON:  04/22/2024 FINDINGS: Cardiac enlargement. No vascular congestion or edema. Patient positioning limits evaluation but there appears to be  infiltration in the right lower lung probably associated with some bronchiectasis. This could represent pneumonia or aspiration. Left lung is clear. No pleural effusion or pneumothorax. Mediastinal contours appear intact. Calcification of the aorta. Degenerative changes in the spine and shoulders. IMPRESSION: Cardiac enlargement. Infiltration suggested in the right lower lung with some bronchiectasis. This may be aspiration or pneumonia. Electronically Signed   By: Elsie Gravely M.D.   On: 05/26/2024 22:38    Microbiology: Results for orders placed or performed during the hospital encounter of 06/13/24  Culture, blood (Routine X 2) w Reflex to ID Panel     Status: None   Collection Time: 06/15/24 12:50 PM   Specimen: BLOOD RIGHT ARM  Result Value Ref Range Status   Specimen Description   Final    BLOOD RIGHT ARM Performed at [facility], [address], [place], [name] [address]    Special Requests   Final    BOTTLES DRAWN AEROBIC AND ANAEROBIC Blood Culture adequate volume Performed at [facility], [address], [place], [name] [address]    Culture   Final    NO GROWTH 5 DAYS Performed at [facility], [address], [place], [name] [address]    Report Status 06/20/2024 FINAL  Final  Culture, blood (Routine X 2) w Reflex to ID Panel     Status: None   Collection Time: 06/15/24 12:50 PM   Specimen: BLOOD LEFT ARM  Result Value Ref Range Status   Specimen Description   Final    BLOOD LEFT ARM Performed at [facility], [address] [place], [name] [address]    Special Requests   Final    BOTTLES DRAWN AEROBIC ONLY Blood Culture adequate volume Performed at [facility], [address], [place], [name] [address]    Culture   Final    NO GROWTH 5 DAYS Performed at [facility], [address] [place], [name] [address]    Report Status 06/20/2024 FINAL  Final    Labs: CBC: Recent Labs   Lab 06/15/24 0525 06/16/24 0921  WBC 12.3* 7.9  HGB 9.7* 9.7*  HCT 30.3* 30.5*  MCV 92.9 93.6  PLT 170  154   Basic Metabolic Panel: Recent Labs  Lab 06/15/24 0525 06/16/24 0921  NA 138 141  K 3.9 4.0  CL 103 107  CO2 23 26  GLUCOSE 131* 112*  BUN 14 10  CREATININE 1.03* 1.05*  CALCIUM  8.9 9.0  MG 2.1  --   PHOS 2.4* 3.1   Liver Function Tests: Recent Labs  Lab 06/15/24 0525 06/15/24 1251  AST  --  28  ALT  --  18  ALKPHOS  --  109  BILITOT  --  1.1  PROT  --  5.3*  ALBUMIN 3.6 3.3*   CBG: Recent Labs  Lab 06/20/24 2043 06/21/24 0002 06/21/24 0511 06/21/24 0819 06/21/24 1151  GLUCAP 108* 109* 104* 99 106*    Discharge time spent: greater than 30 minutes.  Signed: Owen DELENA Lore, MD [facility] 06/21/2024

## 2024-06-21 NOTE — Plan of Care (Signed)
  Problem: Clinical Measurements: Goal: Will remain free from infection Outcome: Progressing Goal: Diagnostic test results will improve Outcome: Progressing   Problem: Nutrition: Goal: Adequate nutrition will be maintained Outcome: Progressing   Problem: Coping: Goal: Level of anxiety will decrease Outcome: Progressing   Problem: Pain Managment: Goal: General experience of comfort will improve and/or be controlled Outcome: Progressing   Problem: Safety: Goal: Ability to remain free from injury will improve Outcome: Progressing   Problem: Skin Integrity: Goal: Risk for impaired skin integrity will decrease Outcome: Progressing

## 2024-06-21 NOTE — Progress Notes (Signed)
 Daily Progress Note   Patient Name: Yvonne Newton       Date: 06/21/2024 DOB: 1941-08-12  Age: 83 y.o. MRN#: [id number] Attending Physician: Madelyne Owen LABOR, MD Primary Care Physician: [facility] Admit Date: 06/13/2024 Length of Stay: 6 days  Reason for Consultation/Follow-up: Establishing goals of care  Subjective:   Reviewed EMR including recent documentation from hospitalist.  Discussed care with bedside RN for medical updates.  Presented to bedside to see patient.  Patient sleeping comfortably in bed and not easily awakened.  Patient's daughter, Yvonne Newton, present at bedside.  Able to introduce myself as a member of the palliative medicine team my role in patient's medical journey.  Spent time answering questions regarding patient's underlying medical status.  Discussed focusing on comfort at this time as had been previously discussed.  Spent time educating about expectations at end-of-life.  Also discussed how continuation of IV fluids at end-of-life can cause more burdensome symptoms such as edema and shortness of breath which is why these are not continued.  Daughter acknowledging this.  Discussed prognosis with patient not eating and drinking.  Normalized end-of-life care.  Spent time providing emotional support via active listening.  All questions answered at that time.  Noted palliative medicine team to continue to follow along with patient's medical journey.  Discussed care with hospitalist, pharmacist, bedside RN, TOC, and ACC liaison to coordinate.  Also personally reviewed patient's ACP documentation found in EMR.  Objective:   Vital Signs:  BP (!) 164/65 (BP Location: Left Arm)   Pulse 76   Temp 97.7 F (36.5 C) (Oral)   Resp 16   Ht 5' 5 (1.651 m)   Wt 73 kg   SpO2 95%   BMI 26.78 kg/m   Physical Exam: General: NAD, chronically ill-appearing, sleeping Cardiovascular: RRR, slight edema in LE b/l Respiratory: no increased work of breathing noted,  not in respiratory distress Abdomen: not distended Neuro: Sleeping comfortably  Assessment & Plan:   Assessment: Patient is a 34-year-old female with past medical history of CVA, HFpEF, dementia, diabetes mellitus type 2, CKD stage IIIb, hypertension, hyperlipidemia, gout, osteoarthritis, chronic anemia, and GERD who was admitted on 06/13/2024 due to hypoglycemia with glucose in 30s.  Patient had recently been hospitalized from 9/25-10/8 for management of fall/syncope, progressive worsening of dementia with decreased oral intake, increased somnolence, and hallucinations.  During that admission patient was noted to have AKI and pyogenic flexor tendon synovitis due to MSSA and underwent I&D and partial amputation.  Orthopedics consulted for recommendations.  Palliative medicine team consulted to assist with complex medical decision making.  Recommendations/Plan: # Complex medical decision making/goals of care:  - Patient unable to participate in complex medical decision making.  - Discussed care with patient's daughter, Yvonne Newton, at bedside as detailed above in HPI.  Currently seeking inpatient hospice at [facility] facility through [facility] or care.  Continuing comfort focused measures at this time.  Spent time explaining end-of-life expectations and how IV fluids can cause worsening symptom burden at end-of-life.  Daughter acknowledging this; discontinuing IV fluids appropriately.  Patient also has ACP documentation which was reviewed and noted did not want life prolonged in this setting.  Continuing focus on comfort while awaiting hospice bed at facility.  Palliative medicine team continuing to follow along with patient's medical journey.  -  Code Status: Do not attempt resuscitation (DNR) - Comfort care  # Symptom management: Patient is receiving these palliative interventions for symptom management with an intent to improve quality of life.   -  Pain   - Change IV Dilaudid 0.5 mg every 1 hour as  needed.  If needing frequent dosing, may need to consider continuous infusion.  # Psychosocial Support:  -Son, daughter  # Discharge Planning: Hospice facility -Patient has been approved for inpatient hospice at [facility] facility.  Awaiting bed.  Discussed with: Patient's daughter at bedside, hospitalist, bedside RN, pharmacist, TOC, ACC liaison  Thank you for allowing the palliative care team to participate in the care [facility].  Tinnie Radar, DO Palliative Care Provider PMT # [phone]  If patient remains symptomatic despite maximum doses, please call PMT at [phone] between 0700 and 1900. Outside of these hours, please call attending, as PMT does not have night coverage.  Billing based on MDM: High  Problems Addressed: One or more chronic illnesses with severe exacerbation, progression, or side effects of treatment.  Risks: Parenteral controlled substances

## 2024-06-22 DIAGNOSIS — E162 Hypoglycemia, unspecified: Secondary | ICD-10-CM | POA: Diagnosis not present

## 2024-06-22 DIAGNOSIS — Z515 Encounter for palliative care: Secondary | ICD-10-CM

## 2024-06-22 DIAGNOSIS — Z711 Person with feared health complaint in whom no diagnosis is made: Secondary | ICD-10-CM

## 2024-06-22 DIAGNOSIS — R630 Anorexia: Secondary | ICD-10-CM | POA: Diagnosis not present

## 2024-06-22 DIAGNOSIS — R638 Other symptoms and signs concerning food and fluid intake: Secondary | ICD-10-CM

## 2024-06-22 DIAGNOSIS — F03918 Unspecified dementia, unspecified severity, with other behavioral disturbance: Secondary | ICD-10-CM | POA: Diagnosis not present

## 2024-06-22 DIAGNOSIS — Z66 Do not resuscitate: Secondary | ICD-10-CM

## 2024-06-22 DIAGNOSIS — R627 Adult failure to thrive: Secondary | ICD-10-CM | POA: Diagnosis not present

## 2024-06-22 LAB — GLUCOSE, CAPILLARY
Glucose-Capillary: 74 mg/dL (ref 70–99)
Glucose-Capillary: 70 mg/dL (ref 70–99)
Glucose-Capillary: 68 mg/dL — ABNORMAL LOW (ref 70–99)
Glucose-Capillary: 80 mg/dL (ref 70–99)

## 2024-06-22 MED ORDER — DEXTROSE 50 % IV SOLN
25.0000 g | Freq: Once | INTRAVENOUS | Status: AC
  Administered 2024-06-22: 25 g via INTRAVENOUS
  Filled 2024-06-22: qty 50

## 2024-06-22 NOTE — Progress Notes (Signed)
 [facility]                                                                              Yvonne Newton, is a 83 y.o. female, DOB - [birth year], FMW:[id number] Admit date - 06/13/2024    Outpatient Primary MD for the patient is [facility]  LOS - 7  days  Chief Complaint  Patient presents with   Hypoglycemia       Brief summary   Patient is a 83 year old with past medical history significant for CVA, heart failure preserved ejection fraction, dementia, diabetes type 2 on metformin and glipizide, CKD 3B, hypertension, hyperlipidemia, gout, osteoarthritis, chronic anemia, GERD recently hospitalized 9/25 through 10/8 for fall /syncope and collapse, progressive worsening dementia with decreased oral intake more somnolent and having hallucination.  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   Awaiting residential hospice.    Assessment & Plan    Diabetes mellitus type 2, Hypoglycemia - Patient with poor oral intake.  Hypoglycemia in the setting of systemic agents, and poor oral intake. -treated with  IV fluids currently on D5 - No further hypoglycemia, likely need to discontinue glipizide and metformin at discharge. -Awaiting residential hospice    Acute metabolic encephalopathy Advanced dementia/ -Per family, patient had been declining for months and worse over the last week prior to admission.  Not eating or drinking.   -ammonia level normal, last hospitalization she had a CT that was unrevealing. -B12 was not low. -Palliative care consulted for goals of care, plan to transition to residential hospice.  Remain with no oral intake, sleepy most of day.    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 -She needs to remain on doxycycline through 11/ 04/2024. -suture removed   Mild leukocytosis - UA negative for infection. -blood cultures: No growth to date   Hypokalemia/hypomagnesemia - Replaced   History CVA: - Continue with aspirin  and statin for secondary stroke prevention   Hyperlipidemia - Comfort care   Hypertension Comfort care   Gout - DNR comfort care   Uncontrolled GERD -Protonix  40 mg daily.   Chronic HFpEF -Last 2D echo November 2023 with a EF of 50 to 55%, grade 1 DD, mild aortic regurgitation.   Chronic anemia - H&H stable, 9.7 today   CKD stage IIIb -Stable.    Estimated body mass index is 26.78 kg/m as calculated from the following:   Height as of this encounter: 5' 5 (1.651 m).   Weight as of this encounter: 73 kg.  Code Status: DNR comfort care DVT Prophylaxis:     Level of Care: Level of care: Med-Surg Family Communication: Disposition Plan:      Remains inpatient appropriate: Awaiting residential hospice   Procedures:    Consultants:  Antimicrobials:   Anti-infectives (From admission, onward)    Start     Dose/Rate Route Frequency Ordered Stop   06/15/24 1200  doxycycline (VIBRAMYCIN) 100 mg in sodium chloride  0.9 % 250 mL IVPB        100 mg 125 mL/hr over 120 Minutes Intravenous 2 times daily 06/15/24 1107     06/14/24 0115  doxycycline (VIBRA-TABS) tablet 100 mg  Status:  Discontinued        100 mg Oral Every 12 hours 06/14/24 0108 06/15/24 1107          Medications  pantoprazole  (PROTONIX ) IV  40 mg Intravenous QHS      Subjective:   Yvonne Newton was seen and examined today.  Some alert, appears comfortable.  Unable to obtain ROS from the patient.   Objective:   Vitals:   06/20/24 1123 06/20/24 1900 06/21/24 0526 06/22/24 0641  BP: (!) 151/86 (!)  181/71 (!) 164/65 (!) 117/48  Pulse: 87 76 76 80  Resp:   16 18  Temp:   97.7 F (36.5 C) 98.6 F (37 C)  TempSrc:   Oral Oral  SpO2:   95% 93%  Weight:      Height:        Intake/Output Summary (Last 24 hours) at 06/22/2024 1501 Last data filed at 06/22/2024 0844 Gross per 24 hour  Intake 250 ml  Output --  Net 250 ml     Wt Readings from Last 3 Encounters:  06/13/24 73 kg  05/29/24 73 kg  07/24/23 67.9 kg     Exam General: Somewhat somnolent, appears comfortable. Cardiovascular: S1 S2 auscultated,  RRR Respiratory: Clear to auscultation bilaterally, no wheezing Gastrointestinal: Soft, nontender, nondistended, + bowel sounds Ext: no pedal edema bilaterally Neuro: Unable to assess Psych: Somnolent    Data Reviewed:  I have personally reviewed following labs    CBC Lab Results  Component Value Date   WBC 7.9 06/16/2024   RBC 3.26 (L) 06/16/2024   HGB 9.7 (L) 06/16/2024   HCT 30.5 (L) 06/16/2024   MCV 93.6 06/16/2024   MCH 29.8 06/16/2024   PLT 154 06/16/2024   MCHC 31.8 06/16/2024   RDW 17.0 (H) 06/16/2024   LYMPHSABS 1.2 06/13/2024   MONOABS 0.8 06/13/2024   EOSABS 0.0 06/13/2024   BASOSABS 0.0 06/13/2024     Last metabolic panel Lab Results  Component Value Date   NA 141 06/16/2024   K 4.0 06/16/2024   CL 107 06/16/2024   CO2 26 06/16/2024   BUN 10 06/16/2024   CREATININE 1.05 (H) 06/16/2024   GLUCOSE 112 (H) 06/16/2024   GFRNONAA 52 (L) 06/16/2024   GFRAA 94 10/18/2020   CALCIUM  9.0 06/16/2024   PHOS 3.1 06/16/2024   PROT 5.3 (L) 06/15/2024   ALBUMIN 3.3 (L) 06/15/2024   BILITOT 1.1 06/15/2024   ALKPHOS 109 06/15/2024   AST 28 06/15/2024   ALT 18 06/15/2024   ANIONGAP 8 06/16/2024    CBG (last 3)  Recent Labs    06/21/24 1649 06/22/24 0755 06/22/24 1248  GLUCAP 82 74 70      Coagulation Profile: No results for input(s): INR, PROTIME in the last 168 hours.   Radiology Studies: I have personally reviewed the imaging  studies  No results found.     Nydia Distance M.D. [facility] 06/22/2024, 3:01 PM  Available via Epic secure chat 7am-7pm After 7 pm, please refer to night coverage provider listed on amion.

## 2024-06-22 NOTE — Progress Notes (Signed)
 Daily Progress Note   Patient Name: Yvonne Newton       Date: 06/22/2024 DOB: 14-Dec-1940  Age: 83 y.o. MRN#: [id number] Attending Physician: Davia Nydia POUR, MD Primary Care Physician: [facility] Admit Date: 06/13/2024 Length of Stay: 7 days  Reason for Consultation/Follow-up: Establishing goals of care  Subjective:   Reviewed EMR including recent documentation from hospitalist.  Discussed care with hospitalist, bedside RN, TOC, and ACC liaison.  Still no beds available at [facility] facility.  No oral input documented yesterday or today.  Patient noted to be pocketing meds so appropriately discontinuing oral medications that are no longer assisting with comfort focused care.  Presented to bedside to see patient.  No visitors present at bedside.  Patient laying in bed unresponsive and appears comfortable.  Continuing to discuss care with team to coordinate planning and support of comfort at end-of-life moving forward.  Objective:   Vital Signs:  BP (!) 117/48 (BP Location: Left Arm)   Pulse 80   Temp 98.6 F (37 C) (Oral)   Resp 18   Ht 5' 5 (1.651 m)   Wt 73 kg   SpO2 93%   BMI 26.78 kg/m   Physical Exam: General: NAD, chronically ill-appearing, unresponsive Cardiovascular: RRR, slight edema in LE b/l Respiratory: no increased work of breathing noted, not in respiratory distress Abdomen: not distended Neuro: Unresponsive Assessment & Plan:   Assessment: Patient is a 5-year-old female with past medical history of CVA, HFpEF, dementia, diabetes mellitus type 2, CKD stage IIIb, hypertension, hyperlipidemia, gout, osteoarthritis, chronic anemia, and GERD who was admitted on 06/13/2024 due to hypoglycemia with glucose in 30s.  Patient had recently been hospitalized from 9/25-10/8 for management of fall/syncope, progressive worsening of dementia with decreased oral intake, increased somnolence, and hallucinations.  During that admission patient was noted to  have AKI and pyogenic flexor tendon synovitis due to MSSA and underwent I&D and partial amputation.  Orthopedics consulted for recommendations.  Palliative medicine team consulted to assist with complex medical decision making.  Recommendations/Plan: # Complex medical decision making/goals of care:  - Patient unable to participate in complex medical decision making.  - No visitors present at bedside today during visit.  Had care with patient's daughter, Clarita, at bedside on 06/21/2024.  Currently seeking inpatient hospice at [facility] facility through [facility] or care.  Continuing comfort focused measures at this time.  Have already reviewed and patient has ACP documentation which was reviewed and noted did not want life prolonged in this setting.  Continuing focus on comfort while awaiting hospice bed at facility.  Palliative medicine team continuing to follow along with patient's medical journey.  -  Code Status: Do not attempt resuscitation (DNR) - Comfort care  # Symptom management: Patient is receiving these palliative interventions for symptom management with an intent to improve quality of life.   - Pain   - Continue IV Dilaudid 0.5 mg every 1 hour as needed.  If needing frequent dosing, may need to consider continuous infusion. - Discontinued oral medications no longer aimed at comfort as patient now pocketing and unable to safely swallow pills.  # Psychosocial Support:  -Son, daughter  # Discharge Planning: Hospice facility -Patient has been approved for inpatient hospice at [facility] facility.  Awaiting bed.  Discussed with: hospitalist, bedside RN, TOC, ACC liaison  Thank you for allowing the palliative care team to participate in the care [facility].  Tinnie Radar, DO Palliative Care Provider PMT # [phone]  If patient  remains symptomatic despite maximum doses, please call PMT at [phone] between 0700 and 1900. Outside of these hours, please call  attending, as PMT does not have night coverage.

## 2024-06-22 NOTE — Progress Notes (Signed)
 [facility] Liaison Note   Received previous request from [facility] for family interest in [facility] in [place]. Eligibility has been confirmed.     Unfortunately, Hospice Home is not able to offer a bed today. Hospital Liaison will follow up tomorrow or sooner if a room becomes available.    Thank you for allowing us  to participate in this patient's care.   Eleanor Nail, LPN [facility] Liaison [phone]

## 2024-06-23 DIAGNOSIS — Z515 Encounter for palliative care: Secondary | ICD-10-CM | POA: Diagnosis not present

## 2024-06-23 DIAGNOSIS — R451 Restlessness and agitation: Secondary | ICD-10-CM | POA: Diagnosis not present

## 2024-06-23 DIAGNOSIS — R627 Adult failure to thrive: Secondary | ICD-10-CM | POA: Diagnosis not present

## 2024-06-23 DIAGNOSIS — Z66 Do not resuscitate: Secondary | ICD-10-CM | POA: Diagnosis not present

## 2024-06-23 DIAGNOSIS — R630 Anorexia: Secondary | ICD-10-CM

## 2024-06-23 DIAGNOSIS — E162 Hypoglycemia, unspecified: Secondary | ICD-10-CM | POA: Diagnosis not present

## 2024-06-23 DIAGNOSIS — Z79899 Other long term (current) drug therapy: Secondary | ICD-10-CM | POA: Diagnosis not present

## 2024-06-23 DIAGNOSIS — Z7189 Other specified counseling: Secondary | ICD-10-CM

## 2024-06-23 LAB — GLUCOSE, CAPILLARY
Glucose-Capillary: 69 mg/dL — ABNORMAL LOW (ref 70–99)
Glucose-Capillary: 69 mg/dL — ABNORMAL LOW (ref 70–99)

## 2024-06-23 MED ORDER — BIOTENE DRY MOUTH MT LIQD: 15.0000 mL | OROMUCOSAL | Status: DC | PRN

## 2024-06-23 MED ORDER — LORAZEPAM 2 MG/ML PO CONC: 1.0000 mg | ORAL | Status: DC | PRN

## 2024-06-23 MED ORDER — LORAZEPAM 2 MG/ML IJ SOLN
1.0000 mg | INTRAMUSCULAR | Status: DC | PRN
  Administered 2024-06-23 (×2): 1 mg via INTRAVENOUS
  Filled 2024-06-23 (×2): qty 1

## 2024-06-23 MED ORDER — POLYVINYL ALCOHOL 1.4 % OP SOLN: 1.0000 [drp] | Freq: Four times a day (QID) | OPHTHALMIC | Status: DC | PRN

## 2024-06-23 MED ORDER — GLYCOPYRROLATE 0.2 MG/ML IJ SOLN
0.2000 mg | INTRAMUSCULAR | Status: DC | PRN
  Administered 2024-06-25: 0.2 mg via INTRAVENOUS
  Filled 2024-06-23: qty 1

## 2024-06-23 NOTE — Plan of Care (Signed)
  Problem: Coping: Goal: Level of anxiety will decrease Outcome: Progressing   Problem: Pain Managment: Goal: General experience of comfort will improve and/or be controlled Outcome: Progressing   Problem: Role Relationship: Goal: Family's ability to cope with current situation will improve Outcome: Progressing Goal: Ability to verbalize concerns, feelings, and thoughts to partner or family member will improve Outcome: Progressing

## 2024-06-23 NOTE — Progress Notes (Signed)
 Hypoglycemic Event  CBG: 69  Treatment: Orders discontinued due to comfort measures   Symptoms: None Possible Reasons for Event: Inadequate meal intake  Comments/MD notified: Rai, MD and Mims DO  Providers notified of hypoglycemic event, agreed not to proceed with hypoglycemic protocol due to comfort measures. Fingerstick's discontinued, pt resting comfortably.   Yvonne Newton

## 2024-06-23 NOTE — Progress Notes (Signed)
 Daily Progress Note   Patient Name: Yvonne Newton       Date: 06/23/2024 DOB: [birth year]  Age: 83 y.o. MRN#: [id number] Attending Physician: Davia Nydia POUR, MD Primary Care Physician: [facility] Admit Date: 06/13/2024 Length of Stay: 8 days  Reason for Consultation/Follow-up: Establishing goals of care  Subjective:   Reviewed EMR including recent documentation from hospitalist.  Patient has not had any oral intake for past 2 days as per I&O review. Discussed care with bedside RN and hospitalist for medical updates.  Patient having hypoglycemia at this point.  Patient is comfort focused care.  Discontinuing interventions that are no longer focused on comfort.  Noted son, Yvonne Newton, requesting update.  This provider was able to call Yvonne Newton and discussed medical updates.  Normalized hypoglycemia at end-of-life.  Again discussed continuing interventions only aimed at symptom management at end-of-life.  Not continuing IV fluids or antibiotics knowing that it would not reverse patient's underlying medical illnesses.  Patient also has ACP documentation on file noting wishes that life not be prolonged with these measures.  Son agreeing and acknowledged would come to bedside to visit patient.  Presented to bedside to see patient later in morning.  Patient's sons, Yvonne Newton and Yvonne Newton, present at bedside.  Able to introduce myself in person as a member of the palliative medicine team.  Patient at times appears agitated and reaching in the air.  Normalized signs of end-of-life.  Discussed providing medications for agitation in this setting.  Family agreeing with this.  Discussed that if patient is not excepted to inpatient hospice, still continuing comfort focused care here.  Spent time providing emotional support about patient being in the end stages of her life.  All questions answered at that time.  Noted palliative medicine team to continue following with patient's medical journey.  Discussed  care with hospitalist, ACC liaison, TOC, and bedside RN throughout the day to coordinate care.  Objective:   Vital Signs:  BP (!) 160/86 (BP Location: Left Arm)   Pulse 83   Temp 98.4 F (36.9 C) (Oral)   Resp 14   Ht 5' 5 (1.651 m)   Wt 73 kg   SpO2 95%   BMI 26.78 kg/m   Physical Exam: General: NAD, chronically ill-appearing, appears agitated, pale Cardiovascular: RRR, slight edema in LE b/l Respiratory: no increased work of breathing noted, not in respiratory distress Abdomen: not distended  Assessment & Plan:   Assessment: Patient is a 60-year-old female with past medical history of CVA, HFpEF, dementia, diabetes mellitus type 2, CKD stage IIIb, hypertension, hyperlipidemia, gout, osteoarthritis, chronic anemia, and GERD who was admitted on 06/13/2024 due to hypoglycemia with glucose in 30s.  Patient had recently been hospitalized from 9/25-10/8 for management of fall/syncope, progressive worsening of dementia with decreased oral intake, increased somnolence, and hallucinations.  During that admission patient was noted to have AKI and pyogenic flexor tendon synovitis due to MSSA and underwent I&D and partial amputation.  Orthopedics consulted for recommendations.  Palliative medicine team consulted to assist with complex medical decision making.  Recommendations/Plan: # Complex medical decision making/goals of care:  - Patient unable to participate in complex medical decision making.  - Discussed care with patient's sons, Yvonne Newton and Yvonne Newton, as detailed above in HPI.  Again normalized end-of-life care and continuing continuing to focus on comfort while awaiting hospice bed at facility.  Will not be continuing interventions that are not aimed at comfort focused care.  Family supporting of this.  TOC and ACC  liaison assisting with discharge coordination.  Palliative medicine team continuing to follow along with patient's medical journey.  -  Code Status: Do not attempt resuscitation  (DNR) - Comfort care  # Symptom management: Patient is receiving these palliative interventions for symptom management with an intent to improve quality of life.   - Pain   - Continue IV Dilaudid 0.5 mg every 1 hour as needed.  If needing frequent dosing, may need to consider continuous infusion.     - Agitation   - Start IV Ativan  1 mg every 4 hours as needed   - Continue as needed Haldol 3 mg every 6 hours as needed  # Psychosocial Support:  -Sons, daughter  # Discharge Planning: Hospice facility - Awaiting bed at [facility] facility.  Continuing comfort focused care in the hospital at this time..  Discussed with: hospitalist, bedside RN, TOC, ACC liaison, sons at bedside  Thank you for allowing the palliative care team to participate in the care [facility].  Tinnie Radar, DO Palliative Care Provider PMT # [phone]  If patient remains symptomatic despite maximum doses, please call PMT at [phone] between 0700 and 1900. Outside of these hours, please call attending, as PMT does not have night coverage.  Billing based on MDM: High  Problems Addressed: One or more chronic illnesses with severe exacerbation, progression, or side effects of treatment.  Risks: Parenteral controlled substances

## 2024-06-23 NOTE — Progress Notes (Signed)
 [facility]                                                                              Yvonne Newton, is a 83 y.o. female, DOB - 1941/01/07, FMW:[id number] Admit date - 06/13/2024    Outpatient Primary MD for the patient is [facility]  LOS - 8  days  Chief Complaint  Patient presents with   Hypoglycemia       Brief summary   Patient is a 83 year old with past medical history significant for CVA, heart failure preserved ejection fraction, dementia, diabetes type 2 on metformin and glipizide, CKD 3B, hypertension, hyperlipidemia, gout, osteoarthritis, chronic anemia, GERD recently hospitalized 9/25 through 10/8 for fall /syncope and collapse, progressive worsening dementia with decreased oral intake more somnolent and having hallucination.  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   Awaiting residential hospice.    Assessment & Plan    Diabetes mellitus type 2, Hypoglycemia - Patient with poor oral intake.  Hypoglycemia in the setting of systemic agents, and poor oral intake. -treated initially with  IV fluids  - Comfort care, awaiting residential hospice    Acute metabolic encephalopathy Advanced dementia/ -Per family, patient had been declining for months and worse over the last week prior to admission.  Not eating or drinking.   -ammonia level normal, last hospitalization she had a CT that was unrevealing. -B12 was not low. 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 -blood cultures: No growth to date   Hypokalemia/hypomagnesemia - Replaced   History CVA: - Continue with aspirin  and statin for secondary stroke prevention   Hyperlipidemia - Comfort care   Hypertension Comfort care   Gout - DNR comfort care   Uncontrolled GERD -Protonix  40 mg daily.   Chronic HFpEF -Last 2D echo November 2023 with a EF of 50 to 55%, grade 1 DD, mild aortic regurgitation.   Chronic anemia - H&H stable, 9.7 today   CKD stage IIIb -Stable.    Estimated body mass index is 26.78 kg/m as calculated from the following:   Height as of this encounter: 5' 5 (1.651 m).   Weight as of this encounter: 73 kg.  Code Status: DNR comfort care DVT Prophylaxis:     Level of Care: Level of care: Med-Surg Family Communication: Disposition Plan:      Remains inpatient appropriate: Awaiting residential hospice   Procedures:    Consultants:     Antimicrobials:   Anti-infectives (From admission, onward)    Start     Dose/Rate Route Frequency Ordered Stop   06/15/24 1200  doxycycline (VIBRAMYCIN) 100 mg in sodium chloride  0.9 %  250 mL IVPB  Status:  Discontinued        100 mg 125 mL/hr over 120 Minutes Intravenous 2 times daily 06/15/24 1107 06/23/24 0838   06/14/24 0115  doxycycline (VIBRA-TABS) tablet 100 mg  Status:  Discontinued        100 mg Oral Every 12 hours 06/14/24 0108 06/15/24 1107          Medications      Subjective:   Yvonne Newton was seen and examined today.  Appears comfortable, somnolent not following any commands.  Objective:   Vitals:   06/21/24 0526 06/22/24 0641 06/22/24 2120 06/23/24 0645  BP: (!) 164/65 (!) 117/48 (!) 108/54 (!) 160/86  Pulse: 76 80 77 83  Resp: 16 18 14 14   Temp: 97.7 F (36.5 C) 98.6 F (37 C) 98.3 F (36.8 C) 98.4 F (36.9 C)  TempSrc: Oral Oral Oral Oral  SpO2: 95% 93% 95% 95%  Weight:      Height:        Intake/Output  Summary (Last 24 hours) at 06/23/2024 1450 Last data filed at 06/22/2024 1600 Gross per 24 hour  Intake 250 ml  Output --  Net 250 ml     Wt Readings from Last 3 Encounters:  06/13/24 73 kg  05/29/24 73 kg  07/24/23 67.9 kg    Physical Exam General: Somnolent, not following any commands, appears comfortable. Cardiovascular: S1 S2 clear, RRR.  Respiratory: CTAB Gastrointestinal: Soft, nondistended, NBS Ext: no pedal edema bilaterally Psych: Somnolent and lethargic   Data Reviewed:  I have personally reviewed following labs    CBC Lab Results  Component Value Date   WBC 7.9 06/16/2024   RBC 3.26 (L) 06/16/2024   HGB 9.7 (L) 06/16/2024   HCT 30.5 (L) 06/16/2024   MCV 93.6 06/16/2024   MCH 29.8 06/16/2024   PLT 154 06/16/2024   MCHC 31.8 06/16/2024   RDW 17.0 (H) 06/16/2024   LYMPHSABS 1.2 06/13/2024   MONOABS 0.8 06/13/2024   EOSABS 0.0 06/13/2024   BASOSABS 0.0 06/13/2024     Last metabolic panel Lab Results  Component Value Date   NA 141 06/16/2024   K 4.0 06/16/2024   CL 107 06/16/2024   CO2 26 06/16/2024   BUN 10 06/16/2024   CREATININE 1.05 (H) 06/16/2024   GLUCOSE 112 (H) 06/16/2024   GFRNONAA 52 (L) 06/16/2024   GFRAA 94 10/18/2020   CALCIUM  9.0 06/16/2024   PHOS 3.1 06/16/2024   PROT 5.3 (L) 06/15/2024   ALBUMIN 3.3 (L) 06/15/2024   BILITOT 1.1 06/15/2024   ALKPHOS 109 06/15/2024   AST 28 06/15/2024   ALT 18 06/15/2024   ANIONGAP 8 06/16/2024    CBG (last 3)  Recent Labs    06/22/24 2122 06/23/24 0647 06/23/24 0751  GLUCAP 80 69* 69*      Coagulation Profile: No results for input(s): INR, PROTIME in the last 168 hours.   Radiology Studies: I have personally reviewed the imaging studies  No results found.     Nydia Distance M.D. [facility] 06/23/2024, 2:50 PM  Available via Epic secure chat 7am-7pm After 7 pm, please refer to night coverage provider listed on amion.

## 2024-06-23 NOTE — TOC Progression Note (Addendum)
 Transition of Care [facility]) - Progression Note    Patient Details  Name: Yvonne Newton MRN: [id number] Date of Birth: April 12, 1941  [facility] CM/SW Contact  Sonda Manuella Quill, RN Phone Number: 06/23/2024, 4:07 PM  Clinical Narrative:    Awaiting status of availability of residential hospice bed; notified Eleanor Nail, Hospital Liaison for [facility]; she will provide update on status of bed availability; IP CM following.  -late entry for 1323- notified by Melissa pt not approved for inpatient hospice level of care.  Expected Discharge Plan: Hospice Medical Facility Barriers to Discharge: Hospice Bed not available               Expected Discharge Plan and Services   Discharge Planning Services: CM Consult Post Acute Care Choice: Residential Hospice Bed   Expected Discharge Date: 06/17/24               DME Arranged: N/A DME Agency: NA       HH Arranged: NA HH Agency: NA         Social Drivers of Health (SDOH) Interventions SDOH Screenings   Food Insecurity: Patient Declined (06/14/2024)  Housing: Unknown (06/14/2024)  Transportation Needs: Patient Declined (06/14/2024)  Utilities: Patient Declined (06/14/2024)  Financial Resource Strain: Low Risk  (05/03/2024)   Received from [facility]  Social Connections: Unknown (06/14/2024)  Tobacco Use: Medium Risk (06/13/2024)    Readmission Risk Interventions    06/21/2024   12:45 PM  Readmission Risk Prevention Plan  Transportation Screening Complete  PCP or Specialist Appt within 3-5 Days Complete  HRI or Home Care Consult Complete  Social Work Consult for Recovery Care Planning/Counseling Complete  Palliative Care Screening Complete  Medication Review [employer]) Complete

## 2024-06-24 DIAGNOSIS — Z66 Do not resuscitate: Secondary | ICD-10-CM | POA: Diagnosis not present

## 2024-06-24 DIAGNOSIS — R627 Adult failure to thrive: Secondary | ICD-10-CM | POA: Diagnosis not present

## 2024-06-24 DIAGNOSIS — R630 Anorexia: Secondary | ICD-10-CM | POA: Diagnosis not present

## 2024-06-24 DIAGNOSIS — Z79899 Other long term (current) drug therapy: Secondary | ICD-10-CM | POA: Diagnosis not present

## 2024-06-24 DIAGNOSIS — F03918 Unspecified dementia, unspecified severity, with other behavioral disturbance: Secondary | ICD-10-CM | POA: Diagnosis not present

## 2024-06-24 DIAGNOSIS — E162 Hypoglycemia, unspecified: Secondary | ICD-10-CM | POA: Diagnosis not present

## 2024-06-24 DIAGNOSIS — R52 Pain, unspecified: Secondary | ICD-10-CM

## 2024-06-24 DIAGNOSIS — Z515 Encounter for palliative care: Secondary | ICD-10-CM | POA: Diagnosis not present

## 2024-06-24 MED ORDER — HYDROMORPHONE HCL 1 MG/ML IJ SOLN
1.0000 mg | INTRAMUSCULAR | Status: DC | PRN
  Administered 2024-06-24 – 2024-06-25 (×4): 1 mg via INTRAVENOUS
  Filled 2024-06-24 (×4): qty 1

## 2024-06-24 NOTE — Progress Notes (Signed)
 Daily Progress Note   Patient Name: Yvonne Newton       Date: 06/24/2024 DOB: 06-20-1941  Age: 83 y.o. MRN#: [id number] Attending Physician: Davia Nydia POUR, MD Primary Care Physician: [facility] Admit Date: 06/13/2024 Length of Stay: 9 days  Reason for Consultation/Follow-up: Establishing goals of care  Subjective:   Reviewed EMR including recent documentation from hospitalist.  At time of EMR review in past 24 hours patient has required as needed IV Haldol 3 mg x 1 dose, as needed IV Ativan  1 mg x 2 doses, and as needed IV Dilaudid 0.5 mg x 3 doses.  Vital signs now showing temperature elevated to 100.3 F.  Patient has not had any documented oral intake since 06/20/2024. Discussed care with bedside RN for medical updates.  Presented to bedside to see patient.  No visitors present at bedside.  Patient unresponsive to verbal or tactile stimuli.   ACC liaison documentation noting that no bed can be offered today in inpatient hospice  facility. Continuing comfort focused care at this time.  Objective:   Vital Signs:  BP (!) 136/49 (BP Location: Left Arm)   Pulse 88   Temp 100.3 F (37.9 C) (Oral)   Resp 14   Ht 5' 5 (1.651 m)   Wt 73 kg   SpO2 94%   BMI 26.78 kg/m   Physical Exam: General: NAD, chronically ill-appearing, unresponsive Cardiovascular: RRR, edema in LE b/l Respiratory: no increased work of breathing noted, not in respiratory distress Abdomen: not distended  Assessment & Plan:   Assessment: Patient is a 39-year-old female with past medical history of CVA, HFpEF, dementia, diabetes mellitus type 2, CKD stage IIIb, hypertension, hyperlipidemia, gout, osteoarthritis, chronic anemia, and GERD who was admitted on 06/13/2024 due to hypoglycemia with glucose in 30s.  Patient had recently been hospitalized from 9/25-10/8 for management of fall/syncope, progressive worsening of dementia with decreased oral intake, increased somnolence, and  hallucinations.  During that admission patient was noted to have AKI and pyogenic flexor tendon synovitis due to MSSA and underwent I&D and partial amputation.  Orthopedics consulted for recommendations.  Palliative medicine team consulted to assist with complex medical decision making.  Recommendations/Plan: # Complex medical decision making/goals of care:  - Patient unable to participate in complex medical decision making.  - No visitors present at bedside today.  Have continued discussions with family during hospitalization.  Continuing continuing to focus on comfort while awaiting hospice bed at facility.  Will not be continuing interventions that are not aimed at comfort focused care.  Family supporting of this.  TOC and ACC liaison assisting with discharge coordination.  Palliative medicine team continuing to follow along with patient's medical journey.  -  Code Status: Do not attempt resuscitation (DNR) - Comfort care  # Symptom management: Patient is receiving these palliative interventions for symptom management with an intent to improve quality of life.   - Pain   - Increase IV Dilaudid to 1 mg every 1 hour as needed based on patient's need for multiple agitation medications over the past 24 hours and concerned this may be related to pain.  If needing frequent dosing, may need to consider continuous infusion.     - Agitation   - Continue IV Ativan  1 mg every 4 hours as needed   - Continue as needed Haldol 3 mg every 6 hours as needed  # Psychosocial Support:  -Sons, daughter  # Discharge Planning: Hospice facility - Awaiting bed at [facility] facility.  Continuing comfort focused care in the hospital at this time.  Thank you for allowing the palliative care team to participate in the care [facility].  Tinnie Radar, DO Palliative Care Provider PMT # [phone]  If patient remains symptomatic despite maximum doses, please call PMT at [phone] between 0700 and  1900. Outside of these hours, please call attending, as PMT does not have night coverage.  Billing based on MDM: High  Problems Addressed: One or more chronic illnesses with severe exacerbation, progression, or side effects of treatment.  Risks: Parenteral controlled substances

## 2024-06-24 NOTE — Progress Notes (Signed)
 [id number] Liaison Note   Received previous request from [facility] for family interest in [facility] in [place]. Eligibility has been confirmed.     Unfortunately, [facility] is not able to offer a bed today. Hospital Liaison will follow up tomorrow or sooner if a room becomes available.    Thank you for allowing us  to participate in this patient's care.   Eleanor Nail, LPN [facility] Liaison [phone]

## 2024-06-24 NOTE — Plan of Care (Signed)
  Problem: Role Relationship: Goal: Family's ability to cope with current situation will improve Outcome: Progressing Goal: Ability to verbalize concerns, feelings, and thoughts to partner or family member will improve Outcome: Progressing   

## 2024-06-24 NOTE — Plan of Care (Signed)
  Problem: Clinical Measurements: Goal: Will remain free from infection Outcome: Progressing Goal: Diagnostic test results will improve Outcome: Progressing   Problem: Nutrition: Goal: Adequate nutrition will be maintained Outcome: Progressing   Problem: Coping: Goal: Level of anxiety will decrease Outcome: Progressing   Problem: Pain Managment: Goal: General experience of comfort will improve and/or be controlled Outcome: Progressing   Problem: Safety: Goal: Ability to remain free from injury will improve Outcome: Progressing   Problem: Skin Integrity: Goal: Risk for impaired skin integrity will decrease Outcome: Progressing   Problem: Education: Goal: Knowledge of the prescribed therapeutic regimen will improve Outcome: Progressing   Problem: Coping: Goal: Ability to identify and develop effective coping behavior will improve Outcome: Progressing   Problem: Clinical Measurements: Goal: Quality of life will improve Outcome: Progressing   Problem: Respiratory: Goal: Verbalizations of increased ease of respirations will increase Outcome: Progressing   Problem: Role Relationship: Goal: Family's ability to cope with current situation will improve Outcome: Progressing Goal: Ability to verbalize concerns, feelings, and thoughts to partner or family member will improve Outcome: Progressing   Problem: Pain Management: Goal: Satisfaction with pain management regimen will improve Outcome: Progressing

## 2024-06-24 NOTE — Progress Notes (Signed)
 [facility]                                                                              Yvonne Newton, is a 83 y.o. female, DOB - 1941-02-28, FMW:[id number] Admit date - 06/13/2024    Outpatient Primary MD for the patient is [facility]  LOS - 9  days  Chief Complaint  Patient presents with   Hypoglycemia       Brief summary   Patient is a 83 year old with past medical history significant for CVA, heart failure preserved ejection fraction, dementia, diabetes type 2 on metformin and glipizide, CKD 3B, hypertension, hyperlipidemia, gout, osteoarthritis, chronic anemia, GERD recently hospitalized 9/25 through 10/8 for fall /syncope and collapse, progressive worsening dementia with decreased oral intake more somnolent and having hallucination.  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   Awaiting residential hospice.    Assessment & Plan    Diabetes mellitus type 2, Hypoglycemia - Patient with poor oral intake.  Hypoglycemia in the setting of systemic agents, and poor oral intake. -treated initially with  IV fluids  - Comfort care, awaiting residential hospice    Acute metabolic encephalopathy Advanced dementia/ -Per family, patient had been declining for months and worse over the last week prior to admission.  Not eating or drinking.   -ammonia level normal, last hospitalization she had a CT that was unrevealing. -B12 was not low. 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 -blood cultures: No growth to date   Hypokalemia/hypomagnesemia - Replaced   History CVA: - Comfort care   Hyperlipidemia - Comfort care   Hypertension Comfort care   Gout - DNR comfort care   Uncontrolled GERD - Comfort care   Chronic HFpEF -Last 2D echo November 2023 with a EF of 50 to 55%, grade 1 DD, mild aortic regurgitation.   Chronic anemia - H&H stable CKD stage IIIb -Stable.    Estimated body mass index is 26.78 kg/m as calculated from the following:   Height as of this encounter: 5' 5 (1.651 m).   Weight as of this encounter: 73 kg.  Code Status: DNR comfort care DVT Prophylaxis:     Level of Care: Level of care: Med-Surg Family Communication: Disposition Plan:      Remains inpatient appropriate: Awaiting residential hospice   Procedures:    Consultants:     Antimicrobials:   Anti-infectives (From admission, onward)    Start     Dose/Rate Route Frequency Ordered Stop   06/15/24 1200  doxycycline (VIBRAMYCIN) 100 mg in sodium chloride  0.9 % 250 mL IVPB  Status:  Discontinued  100 mg 125 mL/hr over 120 Minutes Intravenous 2 times daily 06/15/24 1107 06/23/24 0838   06/14/24 0115  doxycycline (VIBRA-TABS) tablet 100 mg  Status:  Discontinued        100 mg Oral Every 12 hours 06/14/24 0108 06/15/24 1107          Medications      Subjective:   Yvonne Newton was seen and examined today.  Appears comfortable, unresponsive not following any commands.  Objective:   Vitals:   06/22/24 0641 06/22/24 2120 06/23/24 0645 06/24/24 0542  BP: (!) 117/48 (!) 108/54 (!) 160/86 (!) 136/49  Pulse: 80 77 83 88  Resp: 18 14 14 14   Temp: 98.6 F (37 C) 98.3 F (36.8 C) 98.4 F (36.9 C) 100.3 F (37.9 C)  TempSrc: Oral Oral Oral Oral  SpO2: 93% 95% 95% 94%  Weight:      Height:       No intake or output data in the 24 hours ending 06/24/24 1455    Wt Readings from  Last 3 Encounters:  06/13/24 73 kg  05/29/24 73 kg  07/24/23 67.9 kg    Physical Exam General: Responsive, not following any commands, appears comfortable Cardiovascular: S1 S2 clear, RRR.  Respiratory: CTAB Gastrointestinal: Soft, nontender, nondistended, NBS Ext: no pedal edema bilaterally Neuro: Not assessed Psych: unresponsive   Data Reviewed:  I have personally reviewed following labs    CBC Lab Results  Component Value Date   WBC 7.9 06/16/2024   RBC 3.26 (L) 06/16/2024   HGB 9.7 (L) 06/16/2024   HCT 30.5 (L) 06/16/2024   MCV 93.6 06/16/2024   MCH 29.8 06/16/2024   PLT 154 06/16/2024   MCHC 31.8 06/16/2024   RDW 17.0 (H) 06/16/2024   LYMPHSABS 1.2 06/13/2024   MONOABS 0.8 06/13/2024   EOSABS 0.0 06/13/2024   BASOSABS 0.0 06/13/2024     Last metabolic panel Lab Results  Component Value Date   NA 141 06/16/2024   K 4.0 06/16/2024   CL 107 06/16/2024   CO2 26 06/16/2024   BUN 10 06/16/2024   CREATININE 1.05 (H) 06/16/2024   GLUCOSE 112 (H) 06/16/2024   GFRNONAA 52 (L) 06/16/2024   GFRAA 94 10/18/2020   CALCIUM  9.0 06/16/2024   PHOS 3.1 06/16/2024   PROT 5.3 (L) 06/15/2024   ALBUMIN 3.3 (L) 06/15/2024   BILITOT 1.1 06/15/2024   ALKPHOS 109 06/15/2024   AST 28 06/15/2024   ALT 18 06/15/2024   ANIONGAP 8 06/16/2024    CBG (last 3)  Recent Labs    06/22/24 2122 06/23/24 0647 06/23/24 0751  GLUCAP 80 69* 69*      Coagulation Profile: No results for input(s): INR, PROTIME in the last 168 hours.   Radiology Studies: I have personally reviewed the imaging studies  No results found.     Nydia Distance M.D. [facility] 06/24/2024, 2:55 PM  Available via Epic secure chat 7am-7pm After 7 pm, please refer to night coverage provider listed on amion.

## 2024-06-25 DIAGNOSIS — R630 Anorexia: Secondary | ICD-10-CM | POA: Diagnosis not present

## 2024-06-25 DIAGNOSIS — Z711 Person with feared health complaint in whom no diagnosis is made: Secondary | ICD-10-CM | POA: Diagnosis not present

## 2024-06-25 DIAGNOSIS — R627 Adult failure to thrive: Secondary | ICD-10-CM | POA: Diagnosis not present

## 2024-06-25 DIAGNOSIS — Z515 Encounter for palliative care: Secondary | ICD-10-CM | POA: Diagnosis not present

## 2024-06-25 DIAGNOSIS — Z66 Do not resuscitate: Secondary | ICD-10-CM | POA: Diagnosis not present

## 2024-06-25 DIAGNOSIS — E162 Hypoglycemia, unspecified: Secondary | ICD-10-CM | POA: Diagnosis not present

## 2024-06-25 DIAGNOSIS — R52 Pain, unspecified: Secondary | ICD-10-CM | POA: Diagnosis not present

## 2024-07-02 NOTE — Progress Notes (Signed)
 [id number] Hospital Liaison Note  Unfortunately, [facility] nor [facility] is able to offer a room today. Hospital Liaison will follow up tomorrow or sooner if a room becomes available.  Please call with any questions or concerns.  Thank you, Randine Nail, BSN, [facility] [phone]

## 2024-07-02 NOTE — Progress Notes (Signed)
 [facility]                                                                              Yvonne Newton, is a 83 y.o. female, DOB - 1941/08/10, FMW:[id number] Admit date - 06/13/2024    Outpatient Primary MD for the patient is [facility]  LOS - 10  days  Chief Complaint  Patient presents with   Hypoglycemia       Brief summary   Patient is a 83 year old with past medical history significant for CVA, heart failure preserved ejection fraction, dementia, diabetes type 2 on metformin and glipizide, CKD 3B, hypertension, hyperlipidemia, gout, osteoarthritis, chronic anemia, GERD recently hospitalized 9/25 through 10/8 for fall /syncope and collapse, progressive worsening dementia with decreased oral intake more somnolent and having hallucination.  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   Awaiting residential hospice.    Assessment & Plan    Diabetes mellitus type 2, Hypoglycemia - Patient with poor oral intake.  Hypoglycemia in the setting of systemic agents, and poor oral intake. -treated initially with  IV fluids  - Comfort care, awaiting residential hospice    Acute metabolic encephalopathy Advanced dementia/ -Per family, patient had been declining for months and worse over the last week prior to admission.  Not eating or drinking.   -ammonia level normal, last hospitalization she had a CT that was unrevealing. -B12 was not low. 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 -blood cultures: No growth to date   Hypokalemia/hypomagnesemia - Replaced   History CVA: - Comfort care   Hyperlipidemia - Comfort care   Hypertension Comfort care   Gout - DNR comfort care   Uncontrolled GERD - Comfort care   Chronic HFpEF -Last 2D echo November 2023 with a EF of 50 to 55%, grade 1 DD, mild aortic regurgitation.   Chronic anemia - H&H stable  CKD stage IIIb -Stable.    Estimated body mass index is 26.78 kg/m as calculated from the following:   Height as of this encounter: 5' 5 (1.651 m).   Weight as of this encounter: 73 kg.  Code Status: DNR comfort care DVT Prophylaxis:     Level of Care: Level of care: Med-Surg Family Communication: Disposition Plan:      Remains inpatient appropriate: Awaiting residential hospice.    Procedures:    Consultants:     Antimicrobials:   Anti-infectives (From admission, onward)    Start     Dose/Rate Route Frequency Ordered Stop   06/15/24 1200  doxycycline (VIBRAMYCIN) 100 mg in sodium chloride  0.9 % 250 mL IVPB  Status:  Discontinued  100 mg 125 mL/hr over 120 Minutes Intravenous 2 times daily 06/15/24 1107 06/23/24 0838   06/14/24 0115  doxycycline (VIBRA-TABS) tablet 100 mg  Status:  Discontinued        100 mg Oral Every 12 hours 06/14/24 0108 06/15/24 1107          Medications      Subjective:   Yvonne Newton was seen and examined today.  Unresponsive, appears comfortable.  Not following any commands.  Still awaiting residential hospice.    Objective:   Vitals:   06/22/24 2120 06/23/24 0645 06/24/24 0542 June 30, 2024 0532  BP: (!) 108/54 (!) 160/86 (!) 136/49 (!) 111/58  Pulse: 77 83 88 90  Resp: 14 14 14 10   Temp: 98.3 F (36.8 C) 98.4 F (36.9 C) 100.3 F (37.9 C) (!) 97.4 F (36.3 C)  TempSrc: Oral Oral Oral   SpO2: 95% 95% 94% (!) 87%  Weight:      Height:       No intake or output data in the 24  hours ending June 30, 2024 1518    Wt Readings from Last 3 Encounters:  06/13/24 73 kg  05/29/24 73 kg  07/24/23 67.9 kg   Physical Exam General: Unresponsive Cardiovascular: S1 S2 clear, RRR.  Respiratory: CTAB Gastrointestinal: Soft, nondistended, NBS Ext: no pedal edema bilaterally Psych: Unresponsive, not following any commands    Data Reviewed:  I have personally reviewed following labs    CBC Lab Results  Component Value Date   WBC 7.9 06/16/2024   RBC 3.26 (L) 06/16/2024   HGB 9.7 (L) 06/16/2024   HCT 30.5 (L) 06/16/2024   MCV 93.6 06/16/2024   MCH 29.8 06/16/2024   PLT 154 06/16/2024   MCHC 31.8 06/16/2024   RDW 17.0 (H) 06/16/2024   LYMPHSABS 1.2 06/13/2024   MONOABS 0.8 06/13/2024   EOSABS 0.0 06/13/2024   BASOSABS 0.0 06/13/2024     Last metabolic panel Lab Results  Component Value Date   NA 141 06/16/2024   K 4.0 06/16/2024   CL 107 06/16/2024   CO2 26 06/16/2024   BUN 10 06/16/2024   CREATININE 1.05 (H) 06/16/2024   GLUCOSE 112 (H) 06/16/2024   GFRNONAA 52 (L) 06/16/2024   GFRAA 94 10/18/2020   CALCIUM  9.0 06/16/2024   PHOS 3.1 06/16/2024   PROT 5.3 (L) 06/15/2024   ALBUMIN 3.3 (L) 06/15/2024   BILITOT 1.1 06/15/2024   ALKPHOS 109 06/15/2024   AST 28 06/15/2024   ALT 18 06/15/2024   ANIONGAP 8 06/16/2024    CBG (last 3)  Recent Labs    06/22/24 2122 06/23/24 0647 06/23/24 0751  GLUCAP 80 69* 69*      Coagulation Profile: No results for input(s): INR, PROTIME in the last 168 hours.   Radiology Studies: I have personally reviewed the imaging studies  No results found.     Nydia Distance M.D. [facility] June 30, 2024, 3:18 PM  Available via Epic secure chat 7am-7pm After 7 pm, please refer to night coverage provider listed on amion.

## 2024-07-02 NOTE — Plan of Care (Signed)
  Problem: Clinical Measurements: Goal: Will remain free from infection Outcome: Progressing   Problem: Coping: Goal: Level of anxiety will decrease Outcome: Progressing   Problem: Pain Managment: Goal: General experience of comfort will improve and/or be controlled Outcome: Progressing   Problem: Safety: Goal: Ability to remain free from injury will improve Outcome: Progressing

## 2024-07-02 NOTE — Progress Notes (Signed)
 Daily Progress Note   Patient Name: Yvonne Newton       Date: Jul 24, 2024 DOB: Jul 23, 1941  Age: 83 y.o. MRN#: [id number] Attending Physician: Davia Nydia POUR, MD Primary Care Physician: [facility] Admit Date: 06/13/2024 Length of Stay: 10 days  Reason for Consultation/Follow-up: Establishing goals of care  Subjective:   Reviewed EMR including recent documentation from hospitalist and hospice.  At time of  EMR review in past 24 hours patient had received as needed IV Dilaudid 1 mg x 2 doses. Patient has had no oral intake documented since 06/21/2024.   Presented to bedside to see patient.  Patient unresponsive to verbal or tactile stimuli.  No visitors present at bedside.  Patient continues to receive comfort focused care.  Palliative medicine team continuing to follow along with patient's medical journey.  Objective:   Vital Signs:  BP (!) 111/58 (BP Location: Right Arm)   Pulse 90   Temp (!) 97.4 F (36.3 C)   Resp 10   Ht 5' 5 (1.651 m)   Wt 73 kg   SpO2 (!) 87%   BMI 26.78 kg/m   Physical Exam: General: NAD, chronically ill-appearing, unresponsive Cardiovascular: RRR, edema in LE b/l Respiratory: no increased work of breathing noted, not in respiratory distress Abdomen: not distended  Assessment & Plan:   Assessment: Patient is a 83-year-old female with past medical history of CVA, HFpEF, dementia, diabetes mellitus type 2, CKD stage IIIb, hypertension, hyperlipidemia, gout, osteoarthritis, chronic anemia, and GERD who was admitted on 06/13/2024 due to hypoglycemia with glucose in 30s.  Patient had recently been hospitalized from 9/25-10/8 for management of fall/syncope, progressive worsening of dementia with decreased oral intake, increased somnolence, and hallucinations.  During that admission patient was noted to have AKI and pyogenic flexor tendon synovitis due to MSSA and underwent I&D and partial amputation.  Orthopedics consulted for  recommendations.  Palliative medicine team consulted to assist with complex medical decision making.  Recommendations/Plan: # Complex medical decision making/goals of care:  - Patient unable to participate in complex medical decision making.  - No visitors present at bedside today.  Have deviously continued discussions with family during hospitalization.  Continuing continuing to focus on comfort while awaiting hospice bed at facility.  Will not be continuing interventions that are not aimed at comfort focused care.  Family supporting of this.  TOC and ACC liaison assisting with discharge coordination.  Palliative medicine team continuing to follow along with patient's medical journey.  -  Code Status: Do not attempt resuscitation (DNR) - Comfort care  # Symptom management: Patient is receiving these palliative interventions for symptom management with an intent to improve quality of life.   - Pain   - Continue IV Dilaudid to 1 mg every 1 hour as needed based on patient's need for multiple agitation medications over the past 24 hours and concerned this may be related to pain.  If needing frequent dosing, may need to consider continuous infusion.     - Agitation   - Continue IV Ativan  1 mg every 4 hours as needed   - Continue as needed Haldol 3 mg every 6 hours as needed  # Psychosocial Support:  -Sons, daughter  # Discharge Planning: Hospice facility - Awaiting bed at [facility] facility.  Continuing comfort focused care in the hospital at this time.  Thank you for allowing the palliative care team to participate in the care [facility].  Tinnie Radar, DO Palliative Care Provider PMT # [phone]  If patient remains symptomatic despite maximum doses, please call PMT at [phone] between 0700 and 1900. Outside of these hours, please call attending, as PMT does not have night coverage.

## 2024-07-02 NOTE — Progress Notes (Signed)
 Patient was bathed and prepared to go to the morgue in a dignified, respectful manner before being picked up. All family questions were answered, family support was given, and the family left the patient to be cared for one last time. Patient is waiting for transport.

## 2024-07-02 NOTE — Death Summary Note (Signed)
 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   Failure to thrive in adult   History of CVA   Essential hypertension   GERD (gastroesophageal reflux disease)   Type 2 diabetes mellitus (HCC)   Hypokalemia   Hypomagnesemia   CKD stage 3b, GFR 30-44 ml/min (HCC)   Dementia with behavioral disturbance (HCC)   Comfort care   Hospital Course:  Patient is a 83 year old with past medical history significant for CVA, heart failure preserved ejection fraction, dementia, diabetes type 2 on metformin and glipizide, CKD 3B, hypertension, hyperlipidemia, gout, osteoarthritis, chronic anemia, GERD recently hospitalized 9/25 through 10/8 for fall /syncope and collapse, progressive worsening dementia with decreased oral intake more somnolent and having hallucination.  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   Assessment and Plan:  Diabetes mellitus type 2, Hypoglycemia - Patient with poor oral intake.  Hypoglycemia in the setting of systemic agents, and poor oral intake. -treated initially with  IV fluids  - Comfort care, patient was awaiting residential hospice    Acute metabolic encephalopathy Advanced dementia/ -Per family, patient  had been declining for months and worse over the last week prior to admission.  Not eating or drinking.   -ammonia level normal, last hospitalization she had a CT that was unrevealing. -B12 was not low. 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 -blood cultures: No growth to date   Hypokalemia/hypomagnesemia - Replaced   History CVA: - Comfort care   Hyperlipidemia - Comfort care   Hypertension Comfort care   Gout - DNR comfort care   Uncontrolled GERD - Comfort care   Chronic HFpEF -Last 2D echo 11/17/2023with a EF of 50 to 55%, grade 1 DD, mild aortic regurgitation.   Chronic anemia Comfort care   CKD stage IIIb Comfort care     Estimated body mass index is 26.78 kg/m as calculated from the following:   Height as of this encounter: 5' 5 (1.651 m).   Weight as of this encounter: 73 kg.   Patient passed on July 08, 2024 at 1917 (7:17PM)           The results of significant diagnostics from this hospitalization (including imaging, microbiology, ancillary and laboratory) are listed below for reference.   Significant Diagnostic Studies: CT ABDOMEN PELVIS W CONTRAST Result Date: June 26, 2024 CLINICAL DATA:  Abdominal pain EXAM: CT ABDOMEN AND PELVIS WITH CONTRAST TECHNIQUE: Multidetector CT imaging of the abdomen and pelvis was performed using the standard protocol following bolus administration of intravenous contrast.  RADIATION DOSE REDUCTION: This exam was performed according to the departmental dose-optimization program which includes automated exposure control, adjustment of the mA and/or kV according to patient size and/or use of iterative reconstruction technique. CONTRAST:  80mL  OMNIPAQUE  IOHEXOL  300 MG/ML  SOLN COMPARISON:  04/21/2024 FINDINGS: Lower chest: No acute abnormality. Hepatobiliary: No focal liver abnormality is seen. Status post cholecystectomy. No biliary dilatation. Pancreas: No focal abnormality or ductal dilatation. Spleen: No focal abnormality.  Normal size. Adrenals/Urinary Tract: No adrenal abnormality. No focal renal abnormality. No stones or hydronephrosis. Urinary bladder is unremarkable. Stomach/Bowel: Normal appendix. Stomach, large and small bowel grossly unremarkable. Vascular/Lymphatic: Aortic atherosclerosis. No evidence of aneurysm or adenopathy. Reproductive: Prior hysterectomy.  No adnexal masses. Other: No free fluid or free air. Musculoskeletal: No acute bony abnormality. IMPRESSION: No acute findings in the abdomen or pelvis. Aortic atherosclerosis. Electronically Signed   By: Franky Crease M.D.   On: 06/13/2024 21:35    Microbiology: No results found for this or any previous visit (from the past 240 hours).   Signed: Nydia Distance, MD 06/27/24

## 2024-07-02 NOTE — Progress Notes (Signed)
    OVERNIGHT PROGRESS REPORT  Notified by RN that patient is deceased as of 26 Hrs.  Patient was DNR/CMO (comfort measures only)  2 RN verified.  Family was available to RN.     Lynwood Kipper MSNA MSN ACNPC-AG Acute Care Nurse Practitioner [facility]

## 2024-07-02 DEATH — deceased
